# Patient Record
Sex: Female | Born: 1945
Health system: Southern US, Community
[De-identification: ages and names within clinical notes are randomized; demographics above are authoritative.]

## PROBLEM LIST (undated history)

## (undated) DIAGNOSIS — F419 Anxiety disorder, unspecified: Secondary | ICD-10-CM

## (undated) DIAGNOSIS — L719 Rosacea, unspecified: Secondary | ICD-10-CM

## (undated) DIAGNOSIS — E039 Hypothyroidism, unspecified: Secondary | ICD-10-CM

## (undated) DIAGNOSIS — I10 Essential (primary) hypertension: Secondary | ICD-10-CM

## (undated) DIAGNOSIS — M722 Plantar fascial fibromatosis: Secondary | ICD-10-CM

## (undated) DIAGNOSIS — T7840XA Allergy, unspecified, initial encounter: Secondary | ICD-10-CM

## (undated) DIAGNOSIS — K219 Gastro-esophageal reflux disease without esophagitis: Secondary | ICD-10-CM

## (undated) DIAGNOSIS — I872 Venous insufficiency (chronic) (peripheral): Secondary | ICD-10-CM

## (undated) DIAGNOSIS — E213 Hyperparathyroidism, unspecified: Secondary | ICD-10-CM

## (undated) DIAGNOSIS — D518 Other vitamin B12 deficiency anemias: Secondary | ICD-10-CM

## (undated) DIAGNOSIS — K901 Tropical sprue: Secondary | ICD-10-CM

## (undated) DIAGNOSIS — D509 Iron deficiency anemia, unspecified: Secondary | ICD-10-CM

## (undated) DIAGNOSIS — M810 Age-related osteoporosis without current pathological fracture: Secondary | ICD-10-CM

## (undated) HISTORY — DX: Anxiety disorder, unspecified: F41.9

## (undated) HISTORY — DX: Tropical sprue: K90.1

## (undated) HISTORY — PX: OTHER SURGICAL HISTORY: SHX169

## (undated) HISTORY — DX: Allergy, unspecified, initial encounter: T78.40XA

## (undated) HISTORY — PX: CARPAL TUNNEL RELEASE: SHX101

## (undated) HISTORY — DX: Venous insufficiency (chronic) (peripheral): I87.2

## (undated) HISTORY — PX: BREAST BIOPSY: SHX20

## (undated) HISTORY — DX: Age-related osteoporosis without current pathological fracture: M81.0

## (undated) HISTORY — DX: Essential (primary) hypertension: I10

## (undated) HISTORY — DX: Iron deficiency anemia, unspecified: D50.9

## (undated) HISTORY — PX: TONSILLECTOMY: SUR1361

## (undated) HISTORY — DX: Gastro-esophageal reflux disease without esophagitis: K21.9

## (undated) HISTORY — DX: Other vitamin B12 deficiency anemias: D51.8

## (undated) HISTORY — DX: Hypothyroidism, unspecified: E03.9

## (undated) HISTORY — PX: KNEE ARTHROSCOPY: SHX127

## (undated) HISTORY — DX: Plantar fascial fibromatosis: M72.2

## (undated) HISTORY — PX: BREAST EXCISIONAL BIOPSY: SUR124

## (undated) HISTORY — DX: Rosacea, unspecified: L71.9

## (undated) HISTORY — DX: Hyperparathyroidism, unspecified: E21.3

---
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
## 1998-12-14 ENCOUNTER — Other Ambulatory Visit: Admission: RE | Admit: 1998-12-14 | Discharge: 1998-12-14 | Payer: Self-pay | Admitting: *Deleted

## 1998-12-30 ENCOUNTER — Encounter: Payer: Self-pay | Admitting: *Deleted

## 1998-12-30 ENCOUNTER — Ambulatory Visit (HOSPITAL_COMMUNITY): Admission: RE | Admit: 1998-12-30 | Discharge: 1998-12-30 | Payer: Self-pay | Admitting: *Deleted

## 1999-02-22 ENCOUNTER — Emergency Department (HOSPITAL_COMMUNITY): Admission: EM | Admit: 1999-02-22 | Discharge: 1999-02-22 | Payer: Self-pay | Admitting: Emergency Medicine

## 1999-02-22 ENCOUNTER — Encounter: Payer: Self-pay | Admitting: Emergency Medicine

## 1999-03-23 ENCOUNTER — Encounter (INDEPENDENT_AMBULATORY_CARE_PROVIDER_SITE_OTHER): Payer: Self-pay | Admitting: Specialist

## 1999-03-23 ENCOUNTER — Other Ambulatory Visit: Admission: RE | Admit: 1999-03-23 | Discharge: 1999-03-23 | Payer: Self-pay | Admitting: Gastroenterology

## 1999-03-27 ENCOUNTER — Ambulatory Visit (HOSPITAL_COMMUNITY): Admission: RE | Admit: 1999-03-27 | Discharge: 1999-03-27 | Payer: Self-pay | Admitting: Gastroenterology

## 1999-05-25 ENCOUNTER — Encounter: Payer: Self-pay | Admitting: Surgery

## 1999-05-29 ENCOUNTER — Observation Stay (HOSPITAL_COMMUNITY): Admission: RE | Admit: 1999-05-29 | Discharge: 1999-05-30 | Payer: Self-pay | Admitting: Surgery

## 1999-06-06 ENCOUNTER — Encounter: Payer: Self-pay | Admitting: Surgery

## 1999-06-06 ENCOUNTER — Ambulatory Visit (HOSPITAL_COMMUNITY): Admission: RE | Admit: 1999-06-06 | Discharge: 1999-06-06 | Payer: Self-pay | Admitting: Surgery

## 1999-06-13 ENCOUNTER — Ambulatory Visit (HOSPITAL_COMMUNITY): Admission: RE | Admit: 1999-06-13 | Discharge: 1999-06-13 | Payer: Self-pay | Admitting: Surgery

## 1999-06-13 ENCOUNTER — Encounter: Payer: Self-pay | Admitting: Surgery

## 1999-06-20 ENCOUNTER — Ambulatory Visit (HOSPITAL_COMMUNITY): Admission: RE | Admit: 1999-06-20 | Discharge: 1999-06-20 | Payer: Self-pay | Admitting: Gastroenterology

## 1999-06-20 ENCOUNTER — Encounter: Payer: Self-pay | Admitting: Gastroenterology

## 1999-11-20 ENCOUNTER — Other Ambulatory Visit: Admission: RE | Admit: 1999-11-20 | Discharge: 1999-11-20 | Payer: Self-pay | Admitting: *Deleted

## 1999-12-04 ENCOUNTER — Ambulatory Visit (HOSPITAL_COMMUNITY): Admission: RE | Admit: 1999-12-04 | Discharge: 1999-12-04 | Payer: Self-pay | Admitting: *Deleted

## 1999-12-04 ENCOUNTER — Encounter: Payer: Self-pay | Admitting: *Deleted

## 2004-08-24 ENCOUNTER — Ambulatory Visit: Payer: Self-pay | Admitting: Internal Medicine

## 2004-12-14 ENCOUNTER — Ambulatory Visit: Payer: Self-pay | Admitting: Internal Medicine

## 2005-02-15 ENCOUNTER — Ambulatory Visit: Payer: Self-pay | Admitting: Internal Medicine

## 2005-02-22 ENCOUNTER — Ambulatory Visit: Payer: Self-pay | Admitting: Internal Medicine

## 2005-03-29 ENCOUNTER — Ambulatory Visit: Payer: Self-pay | Admitting: Internal Medicine

## 2005-06-07 ENCOUNTER — Ambulatory Visit: Payer: Self-pay | Admitting: Internal Medicine

## 2005-06-18 ENCOUNTER — Other Ambulatory Visit: Admission: RE | Admit: 2005-06-18 | Discharge: 2005-06-18 | Payer: Self-pay | Admitting: *Deleted

## 2005-07-19 ENCOUNTER — Ambulatory Visit: Payer: Self-pay | Admitting: Internal Medicine

## 2005-10-29 ENCOUNTER — Ambulatory Visit: Payer: Self-pay | Admitting: Internal Medicine

## 2006-02-14 ENCOUNTER — Ambulatory Visit: Payer: Self-pay | Admitting: Internal Medicine

## 2006-03-18 ENCOUNTER — Ambulatory Visit: Payer: Self-pay | Admitting: Internal Medicine

## 2006-04-26 ENCOUNTER — Ambulatory Visit: Payer: Self-pay | Admitting: Internal Medicine

## 2006-07-03 ENCOUNTER — Ambulatory Visit: Payer: Self-pay | Admitting: Internal Medicine

## 2006-08-27 ENCOUNTER — Ambulatory Visit: Payer: Self-pay | Admitting: Internal Medicine

## 2007-01-02 ENCOUNTER — Ambulatory Visit: Payer: Self-pay | Admitting: Internal Medicine

## 2007-03-06 ENCOUNTER — Ambulatory Visit: Payer: Self-pay | Admitting: Internal Medicine

## 2007-04-28 DIAGNOSIS — I872 Venous insufficiency (chronic) (peripheral): Secondary | ICD-10-CM

## 2007-04-28 DIAGNOSIS — E038 Other specified hypothyroidism: Secondary | ICD-10-CM | POA: Insufficient documentation

## 2007-04-28 DIAGNOSIS — K219 Gastro-esophageal reflux disease without esophagitis: Secondary | ICD-10-CM | POA: Insufficient documentation

## 2007-04-28 HISTORY — DX: Venous insufficiency (chronic) (peripheral): I87.2

## 2007-05-06 ENCOUNTER — Ambulatory Visit: Payer: Self-pay | Admitting: Internal Medicine

## 2007-05-06 DIAGNOSIS — I10 Essential (primary) hypertension: Secondary | ICD-10-CM | POA: Insufficient documentation

## 2007-05-06 DIAGNOSIS — J309 Allergic rhinitis, unspecified: Secondary | ICD-10-CM | POA: Insufficient documentation

## 2007-05-06 DIAGNOSIS — M810 Age-related osteoporosis without current pathological fracture: Secondary | ICD-10-CM

## 2007-05-06 HISTORY — DX: Age-related osteoporosis without current pathological fracture: M81.0

## 2007-05-06 LAB — CONVERTED CEMR LAB
BUN: 27 mg/dL — ABNORMAL HIGH (ref 6–23)
CO2: 26 meq/L (ref 19–32)
Calcium: 9.5 mg/dL (ref 8.4–10.5)
Chloride: 104 meq/L (ref 96–112)
Creatinine, Ser: 1.6 mg/dL — ABNORMAL HIGH (ref 0.4–1.2)
GFR calc Af Amer: 42 mL/min
GFR calc non Af Amer: 35 mL/min
Glucose, Bld: 94 mg/dL (ref 70–99)
Potassium: 4.1 meq/L (ref 3.5–5.1)
Sodium: 141 meq/L (ref 135–145)
TSH: 0.6 microintl units/mL (ref 0.35–5.50)
Vit D, 1,25-Dihydroxy: 17 — ABNORMAL LOW (ref 20–57)

## 2007-05-08 ENCOUNTER — Telehealth: Payer: Self-pay | Admitting: *Deleted

## 2007-07-18 ENCOUNTER — Ambulatory Visit: Payer: Self-pay | Admitting: Internal Medicine

## 2007-07-31 ENCOUNTER — Ambulatory Visit: Payer: Self-pay | Admitting: Internal Medicine

## 2007-07-31 ENCOUNTER — Encounter: Payer: Self-pay | Admitting: Internal Medicine

## 2007-08-06 ENCOUNTER — Ambulatory Visit: Payer: Self-pay | Admitting: Internal Medicine

## 2007-08-18 DIAGNOSIS — K900 Celiac disease: Secondary | ICD-10-CM | POA: Insufficient documentation

## 2007-08-29 ENCOUNTER — Encounter: Payer: Self-pay | Admitting: Internal Medicine

## 2007-09-16 ENCOUNTER — Encounter: Payer: Self-pay | Admitting: Internal Medicine

## 2007-10-31 ENCOUNTER — Encounter: Payer: Self-pay | Admitting: Internal Medicine

## 2007-11-07 ENCOUNTER — Ambulatory Visit: Payer: Self-pay | Admitting: Internal Medicine

## 2008-01-29 ENCOUNTER — Ambulatory Visit: Payer: Self-pay | Admitting: Internal Medicine

## 2008-01-29 ENCOUNTER — Telehealth: Payer: Self-pay | Admitting: Internal Medicine

## 2008-02-03 ENCOUNTER — Ambulatory Visit: Payer: Self-pay | Admitting: Internal Medicine

## 2008-03-18 ENCOUNTER — Ambulatory Visit: Payer: Self-pay | Admitting: Internal Medicine

## 2008-03-18 LAB — CONVERTED CEMR LAB
T3 Uptake Ratio: 38.9 % — ABNORMAL HIGH (ref 22.5–37.0)
T4, Total: 9.6 ug/dL (ref 5.0–12.5)
TSH: 1.04 microintl units/mL (ref 0.35–5.50)

## 2008-03-31 ENCOUNTER — Telehealth: Payer: Self-pay | Admitting: Internal Medicine

## 2008-04-16 ENCOUNTER — Ambulatory Visit: Payer: Self-pay | Admitting: Internal Medicine

## 2008-05-06 ENCOUNTER — Encounter: Payer: Self-pay | Admitting: Internal Medicine

## 2008-06-14 ENCOUNTER — Ambulatory Visit: Payer: Self-pay | Admitting: Internal Medicine

## 2008-06-14 LAB — CONVERTED CEMR LAB
Basophils Absolute: 0 10*3/uL (ref 0.0–0.1)
Basophils Relative: 0.5 % (ref 0.0–3.0)
Eosinophils Absolute: 0.2 10*3/uL (ref 0.0–0.7)
Eosinophils Relative: 3.2 % (ref 0.0–5.0)
Free T4: 1.1 ng/dL (ref 0.6–1.6)
HCT: 37.4 % (ref 36.0–46.0)
Hemoglobin: 13 g/dL (ref 12.0–15.0)
Lymphocytes Relative: 31 % (ref 12.0–46.0)
MCHC: 34.9 g/dL (ref 30.0–36.0)
MCV: 100.8 fL — ABNORMAL HIGH (ref 78.0–100.0)
Monocytes Absolute: 0.9 10*3/uL (ref 0.1–1.0)
Monocytes Relative: 14 % — ABNORMAL HIGH (ref 3.0–12.0)
Neutro Abs: 3.5 10*3/uL (ref 1.4–7.7)
Neutrophils Relative %: 51.3 % (ref 43.0–77.0)
Platelets: 326 10*3/uL (ref 150–400)
RBC: 3.71 M/uL — ABNORMAL LOW (ref 3.87–5.11)
RDW: 13.1 % (ref 11.5–14.6)
T3, Free: 3 pg/mL (ref 2.3–4.2)
TSH: 1.21 microintl units/mL (ref 0.35–5.50)
WBC: 6.6 10*3/uL (ref 4.5–10.5)

## 2008-07-01 ENCOUNTER — Telehealth: Payer: Self-pay | Admitting: Internal Medicine

## 2008-07-12 ENCOUNTER — Telehealth: Payer: Self-pay | Admitting: Internal Medicine

## 2008-10-08 ENCOUNTER — Ambulatory Visit: Payer: Self-pay | Admitting: Internal Medicine

## 2008-10-08 LAB — CONVERTED CEMR LAB
BUN: 32 mg/dL — ABNORMAL HIGH (ref 6–23)
Basophils Absolute: 0 10*3/uL (ref 0.0–0.1)
Basophils Relative: 1 % (ref 0–1)
CO2: 28 meq/L (ref 19–32)
Calcium: 10 mg/dL (ref 8.4–10.5)
Chloride: 100 meq/L (ref 96–112)
Creatinine, Ser: 1.4 mg/dL — ABNORMAL HIGH (ref 0.40–1.20)
Eosinophils Absolute: 0.3 10*3/uL (ref 0.0–0.7)
Eosinophils Relative: 4 % (ref 0–5)
Glucose, Bld: 102 mg/dL — ABNORMAL HIGH (ref 70–99)
HCT: 35.9 % — ABNORMAL LOW (ref 36.0–46.0)
Hemoglobin: 12.1 g/dL (ref 12.0–15.0)
Lymphocytes Relative: 34 % (ref 12–46)
Lymphs Abs: 2.4 10*3/uL (ref 0.7–4.0)
MCHC: 33.7 g/dL (ref 30.0–36.0)
MCV: 98.6 fL (ref 78.0–100.0)
Monocytes Absolute: 0.9 10*3/uL (ref 0.1–1.0)
Monocytes Relative: 13 % — ABNORMAL HIGH (ref 3–12)
Neutro Abs: 3.4 10*3/uL (ref 1.7–7.7)
Neutrophils Relative %: 48 % (ref 43–77)
Platelets: 301 10*3/uL (ref 150–400)
Potassium: 4.3 meq/L (ref 3.5–5.3)
Pro B Natriuretic peptide (BNP): 29.6 pg/mL (ref 0.0–100.0)
RBC: 3.64 M/uL — ABNORMAL LOW (ref 3.87–5.11)
RDW: 12.9 % (ref 11.5–15.5)
Sodium: 141 meq/L (ref 135–145)
TSH: 1.355 microintl units/mL (ref 0.350–4.50)
WBC: 7 10*3/uL (ref 4.0–10.5)

## 2008-10-29 ENCOUNTER — Ambulatory Visit: Payer: Self-pay | Admitting: Internal Medicine

## 2009-01-04 ENCOUNTER — Telehealth: Payer: Self-pay | Admitting: Internal Medicine

## 2009-01-06 ENCOUNTER — Telehealth: Payer: Self-pay | Admitting: *Deleted

## 2009-01-28 ENCOUNTER — Ambulatory Visit: Payer: Self-pay | Admitting: Internal Medicine

## 2009-01-28 DIAGNOSIS — D509 Iron deficiency anemia, unspecified: Secondary | ICD-10-CM

## 2009-01-28 DIAGNOSIS — D518 Other vitamin B12 deficiency anemias: Secondary | ICD-10-CM

## 2009-01-28 HISTORY — DX: Iron deficiency anemia, unspecified: D50.9

## 2009-01-28 HISTORY — DX: Other vitamin B12 deficiency anemias: D51.8

## 2009-01-28 LAB — CONVERTED CEMR LAB
Alkaline Phosphatase: 61 units/L (ref 39–117)
Basophils Relative: 1 % (ref 0–1)
Bilirubin, Direct: 0.1 mg/dL (ref 0.0–0.3)
Calcium: 9.4 mg/dL (ref 8.4–10.5)
Chloride: 107 meq/L (ref 96–112)
Creatinine, Ser: 1.31 mg/dL — ABNORMAL HIGH (ref 0.40–1.20)
Eosinophils Absolute: 0.3 10*3/uL (ref 0.0–0.7)
Indirect Bilirubin: 0.3 mg/dL (ref 0.0–0.9)
Iron: 62 ug/dL (ref 42–145)
Lymphs Abs: 2.2 10*3/uL (ref 0.7–4.0)
MCV: 98.6 fL (ref 78.0–100.0)
Neutro Abs: 4.1 10*3/uL (ref 1.7–7.7)
Neutrophils Relative %: 55 % (ref 43–77)
Platelets: 333 10*3/uL (ref 150–400)
RBC: 3.7 M/uL — ABNORMAL LOW (ref 3.87–5.11)
Saturation Ratios: 20 % (ref 20–55)
Sodium: 144 meq/L (ref 135–145)
TIBC: 315 ug/dL (ref 250–470)
Transferrin: 233 mg/dL (ref 212–360)
UIBC: 253 ug/dL
WBC: 7.5 10*3/uL (ref 4.0–10.5)

## 2009-02-04 ENCOUNTER — Telehealth: Payer: Self-pay | Admitting: Internal Medicine

## 2009-02-25 ENCOUNTER — Ambulatory Visit: Payer: Self-pay | Admitting: Internal Medicine

## 2009-02-25 DIAGNOSIS — L719 Rosacea, unspecified: Secondary | ICD-10-CM

## 2009-02-25 HISTORY — DX: Rosacea, unspecified: L71.9

## 2009-02-28 ENCOUNTER — Ambulatory Visit: Payer: Self-pay | Admitting: Internal Medicine

## 2009-02-28 ENCOUNTER — Telehealth: Payer: Self-pay | Admitting: Internal Medicine

## 2009-03-31 LAB — HM COLONOSCOPY

## 2009-04-28 ENCOUNTER — Ambulatory Visit: Payer: Self-pay | Admitting: Internal Medicine

## 2009-04-28 LAB — CONVERTED CEMR LAB
Calcium: 9.7 mg/dL (ref 8.4–10.5)
Folate: >20 ng/mL
GFR calc non Af Amer: 43.79 mL/min (ref >60)
Hgb A1c MFr Bld: 5.3 % (ref 4.6–6.5)
Iron: 71 ug/dL (ref 42–145)
Saturation Ratios: 23.3 % (ref 20.0–50.0)
Sodium: 143 meq/L (ref 135–145)
Vitamin B-12: 1053 pg/mL — ABNORMAL HIGH (ref 211–911)

## 2009-07-05 ENCOUNTER — Ambulatory Visit: Payer: Self-pay | Admitting: Internal Medicine

## 2009-07-05 DIAGNOSIS — M722 Plantar fascial fibromatosis: Secondary | ICD-10-CM

## 2009-07-05 HISTORY — DX: Plantar fascial fibromatosis: M72.2

## 2009-10-07 ENCOUNTER — Ambulatory Visit: Payer: Self-pay | Admitting: Internal Medicine

## 2009-11-10 ENCOUNTER — Encounter: Payer: Self-pay | Admitting: Internal Medicine

## 2010-01-03 ENCOUNTER — Ambulatory Visit: Payer: Self-pay | Admitting: Internal Medicine

## 2010-01-03 LAB — CONVERTED CEMR LAB
Basophils Relative: 0.6 % (ref 0.0–3.0)
Eosinophils Absolute: 0.3 10*3/uL (ref 0.0–0.7)
HCT: 35.2 % — ABNORMAL LOW (ref 36.0–46.0)
Hemoglobin: 12.4 g/dL (ref 12.0–15.0)
Lymphocytes Relative: 29.3 % (ref 12.0–46.0)
MCHC: 35.3 g/dL (ref 30.0–36.0)
MCV: 100 fL (ref 78.0–100.0)
Monocytes Absolute: 1 10*3/uL (ref 0.1–1.0)
Neutro Abs: 4.6 10*3/uL (ref 1.4–7.7)
RBC: 3.52 M/uL — ABNORMAL LOW (ref 3.87–5.11)

## 2010-01-31 ENCOUNTER — Ambulatory Visit: Payer: Self-pay | Admitting: Internal Medicine

## 2010-03-07 ENCOUNTER — Ambulatory Visit: Payer: Self-pay | Admitting: Internal Medicine

## 2010-03-07 LAB — CONVERTED CEMR LAB: T3, Free: 3.5 pg/mL (ref 2.3–4.2)

## 2010-03-14 ENCOUNTER — Ambulatory Visit: Payer: Self-pay | Admitting: Internal Medicine

## 2010-04-03 ENCOUNTER — Telehealth: Payer: Self-pay | Admitting: Internal Medicine

## 2010-06-13 ENCOUNTER — Ambulatory Visit: Payer: Self-pay | Admitting: Internal Medicine

## 2010-06-13 DIAGNOSIS — I720 Aneurysm of carotid artery: Secondary | ICD-10-CM | POA: Insufficient documentation

## 2010-06-15 ENCOUNTER — Encounter: Payer: Self-pay | Admitting: Internal Medicine

## 2010-06-16 ENCOUNTER — Ambulatory Visit: Payer: Self-pay

## 2010-06-16 ENCOUNTER — Encounter: Payer: Self-pay | Admitting: Internal Medicine

## 2010-09-08 ENCOUNTER — Ambulatory Visit: Payer: Self-pay | Admitting: Family Medicine

## 2010-09-20 ENCOUNTER — Ambulatory Visit
Admission: RE | Admit: 2010-09-20 | Discharge: 2010-09-20 | Payer: Self-pay | Source: Home / Self Care | Attending: Internal Medicine | Admitting: Internal Medicine

## 2010-10-17 NOTE — Progress Notes (Signed)
Summary: sinus/  Phone Note Call from Patient   Caller: Patient Call For: Ricard Dillon MD Summary of Call: Asking for meds for sinus complaints and productive cough......Marland Kitchenblood from nose.........green productive cough.  Temp 99 at night. [place].   [id number] Initial call taken by: Deanna Artis CMA,  April 03, 2010 8:48 AM  Follow-up for Phone Call        perdr Larrie Kass hve clarithromycin 500 two times a day for 10 days and otc meds for cough Follow-up by: Allyne Gee, LPN,  July 18, 624THL [id number] AM    New/Updated Medications: CLARITHROMYCIN 500 MG TABS (CLARITHROMYCIN) one by mouth two times a day x 10 days Prescriptions: CLARITHROMYCIN 500 MG TABS (CLARITHROMYCIN) one by mouth two times a day x 10 days  #20 x 0   Entered by:   Deanna Artis CMA   Authorized by:   Ricard Dillon MD   Signed by:   Deanna Artis CMA on 04/03/2010   Method used:   Electronically to        [facility] (retail)       [place], [place]  [id number]       Ph: [phone]       Fax: [phone]   RxID:   [id number]  Pt. notified.

## 2010-10-17 NOTE — Progress Notes (Signed)
Summary: [facility]. Problem with Biaxin. Pt waiting at Pharmacy  Phone Note From Pharmacy Call back at [phone] Angie at [place]: [facility]* Reason for Call: Allergy Alert Summary of Call: Pt is at the pharmacy to pick up script, but it is not available from manufacturer. Need to get alternative to Biaxin 500mg  1 two times a day. Pharmacy has the Biaxin XR 500mg , but it would be 2 once a day, instead of two times a day. Pls call asap. Pt waiting at store.    Initial call taken by: Braulio Bosch,  April 03, 2010 11:35 AM  Follow-up for Phone Call        ok per dr Arnoldo Morale Follow-up by: Allyne Gee, LPN,  July 18, 624THL 075-GRM AM

## 2010-10-17 NOTE — Miscellaneous (Signed)
Summary: Orders Update  Clinical Lists Changes  Orders: Added new Test order of Carotid Duplex (Carotid Duplex) - Signed 

## 2010-10-17 NOTE — Assessment & Plan Note (Signed)
Summary: 1 month rov/njr   Vital Signs:  Patient profile:   65 year old female Height:      65 inches Weight:      208 pounds BMI:     34.74 Temp:     98.2 degrees F oral Pulse rate:   76 / minute Resp:     14 per minute BP sitting:   140 / 74  (left arm)  Vitals Entered By: Allyne Gee, LPN (May 17, 624THL QA348G PM) CC: roa-   CC:  roa-.  Preventive Screening-Counseling & Management  Alcohol-Tobacco     Smoking Status: never  Problems Prior to Update: 1)  Osteoarthros Unspec Whether Gen/loc Unspec Site  (ICD-715.90) 2)  Plantar Fasciitis, Left  (ICD-728.71) 3)  Onychomycosis  (ICD-110.1) 4)  Rosacea  (ICD-695.3) 5)  Anemia, B12 Deficiency  (ICD-281.1) 6)  Unspecified Iron Deficiency Anemia  (ICD-280.9) 7)  Adverse Drug Reaction  (ICD-995.20) 8)  Edema  (ICD-782.3) 9)  Loc Osteoarthros Not Spec Prim/sec Lower Leg  (ICD-715.36) 10)  Hemangioma of Skin and Subcutaneous Tissue  (ICD-228.01) 11)  Acute Bronchitis  (ICD-466.0) 12)  Celiac Disease  (ICD-579.0) 13)  Other Specified Intestinal Malabsorption  (ICD-579.8) 14)  Osteoporosis  (ICD-733.00) 15)  Hypertension  (ICD-401.9) 16)  Anxiety  (ICD-300.00) 17)  Allergic Rhinitis  (ICD-477.9) 18)  Venous Insufficiency, Chronic  (ICD-459.81) 19)  Obesity  (ICD-278.00) 20)  Hypothyroidism  (ICD-244.9) 21)  Gerd  (ICD-530.81) 22)  Asthma  (ICD-493.90)  Current Problems (verified): 1)  Osteoarthros Unspec Whether Gen/loc Unspec Site  (ICD-715.90) 2)  Plantar Fasciitis, Left  (ICD-728.71) 3)  Onychomycosis  (ICD-110.1) 4)  Rosacea  (ICD-695.3) 5)  Anemia, B12 Deficiency  (ICD-281.1) 6)  Unspecified Iron Deficiency Anemia  (ICD-280.9) 7)  Adverse Drug Reaction  (ICD-995.20) 8)  Edema  (ICD-782.3) 9)  Loc Osteoarthros Not Spec Prim/sec Lower Leg  (ICD-715.36) 10)  Hemangioma of Skin and Subcutaneous Tissue  (ICD-228.01) 11)  Acute Bronchitis  (ICD-466.0) 12)  Celiac Disease  (ICD-579.0) 13)  Other Specified  Intestinal Malabsorption  (ICD-579.8) 14)  Osteoporosis  (ICD-733.00) 15)  Hypertension  (ICD-401.9) 16)  Anxiety  (ICD-300.00) 17)  Allergic Rhinitis  (ICD-477.9) 18)  Venous Insufficiency, Chronic  (ICD-459.81) 19)  Obesity  (ICD-278.00) 20)  Hypothyroidism  (ICD-244.9) 21)  Gerd  (ICD-530.81) 22)  Asthma  (ICD-493.90)  Medications Prior to Update: 1)  Synthroid 75 Mcg  Tabs (Levothyroxine Sodium) .... Take 1 Tablet By Mouth Once A Day 2)  Xanax 0.25 Mg  Tabs (Alprazolam) .... Three Times A Day As Needed 3)  Wellbutrin Xl 150 Mg  Tb24 (Bupropion Hcl) .... 2 Every Morning 1 Every Afternoon 4)  Allegra-D 12 Hour 60-120 Mg  Tb12 (Fexofenadine-Pseudoephedrine) .... Once Daily As Needed 5)  Potassium Chloride Cr 10 Meq  Tbcr (Potassium Chloride) .... Once Daily 6)  Micardis 40 Mg  Tabs (Telmisartan) .... Take 1/2  Tablet By Mouth Once A Day 7)  Singulair 10 Mg  Tabs (Montelukast Sodium) .... Take 1 Tablet By Mouth Once A Day 8)  Zegerid 40-1100 Mg  Caps (Omeprazole-Sodium Bicarbonate) .... Take 1 Capsule By Mouth At Bedtime 9)  Duoneb 2.5-0.5 Mg/58ml  Soln (Albuterol-Ipratropium) .... As Needed 10)  Veramyst 27.5 Mcg/spray  Susp (Fluticasone Furoate) .... As Needed 11)  Symbicort 160-4.5 Mcg/act  Aero (Budesonide-Formoterol Fumarate) .... Two Times A Day 12)  Amitiza 8 Mcg  Caps (Lubiprostone) .Marland Kitchen.. 1 Once Daily 13)  Restasis 0.05 % Emul (Cyclosporine) .Marland Kitchen.. 1 Drop Bothe  Eyes Bid 14)  Prenatal/iron  Tabs (Prenatal Multivit-Min-Fe-Fa) .... May Substitute  One By Mouth Daily 15)  Torsemide 100 Mg Tabs (Torsemide) .... One By Mouth Q Am 16)  Rosac 10-5 % Crea (Sulfacetamide-Sulfur-Sunscreen) .... Apply Q Hs 17)  Levsin/sl 0.125 Mg Subl (Hyoscyamine Sulfate) .... One Sl Q 2-4 Hours Prn 18)  Vimovo 500-20 Mg Tbec (Naproxen-Esomeprazole) .... One By Mouth Daily  Current Medications (verified): 1)  Armour Thyroid 90 Mg Tabs (Thyroid) .... One By Mouth Daily 2)  Xanax 0.25 Mg  Tabs (Alprazolam)  .... Three Times A Day As Needed 3)  Wellbutrin Xl 150 Mg  Tb24 (Bupropion Hcl) .... 2 Every Morning 1 Every Afternoon 4)  Allegra-D 12 Hour 60-120 Mg  Tb12 (Fexofenadine-Pseudoephedrine) .... Once Daily As Needed 5)  Potassium Chloride Cr 10 Meq  Tbcr (Potassium Chloride) .... Once Daily 6)  Micardis 40 Mg  Tabs (Telmisartan) .... Take 1/2  Tablet By Mouth Once A Day 7)  Singulair 10 Mg  Tabs (Montelukast Sodium) .... Take 1 Tablet By Mouth Once A Day 8)  Zegerid 40-1100 Mg  Caps (Omeprazole-Sodium Bicarbonate) .... Take 1 Capsule By Mouth At Bedtime 9)  Duoneb 2.5-0.5 Mg/58ml  Soln (Albuterol-Ipratropium) .... As Needed 10)  Nasonex 50 Mcg/act Susp (Mometasone Furoate) .... Two Sprays in Each Nostril Daily 11)  Symbicort 160-4.5 Mcg/act  Aero (Budesonide-Formoterol Fumarate) .... Two Times A Day 12)  Amitiza 8 Mcg  Caps (Lubiprostone) .Marland Kitchen.. 1 Once Daily 13)  Restasis 0.05 % Emul (Cyclosporine) .Marland Kitchen.. 1 Drop Bothe Eyes Bid 14)  Prenatal/iron  Tabs (Prenatal Multivit-Min-Fe-Fa) .... May Substitute  One By Mouth Daily 15)  Torsemide 100 Mg Tabs (Torsemide) .... One By Mouth Q Am 16)  Rosac 10-5 % Crea (Sulfacetamide-Sulfur-Sunscreen) .... Apply Q Hs 17)  Levsin/sl 0.125 Mg Subl (Hyoscyamine Sulfate) .... One Sl Q 2-4 Hours Prn 18)  Vimovo 500-20 Mg Tbec (Naproxen-Esomeprazole) .... One By Mouth Daily  Allergies (verified): 1)  ! Penicillin V Potassium (Penicillin V Potassium) 2)  ! Compazine  Past History:  Family History: Last updated: 2009-07-30 mother died of lung cnacer at age 69 father alive at age 77 macular degeneration two brothesr alive and well uncle with colon cancer  Social History: Last updated: July 30, 2009 Married Never Smoked Occupation: professional Alcohol use-no Drug use-no Regular exercise-no  Risk Factors: Exercise: no (30-Jul-2009)  Risk Factors: Smoking Status: never (01/31/2010)  Past medical, surgical, family and social histories (including risk  factors) reviewed, and no changes noted (except as noted below).  Past Medical History: Reviewed history from 06/14/2008 and no changes required. Allergic rhinitis Anxiety Asthma Hypertension Hypothyroidism GERD SPRUE  Past Surgical History: Reviewed history from 05/06/2007 and no changes required. Carpal tunnel release Tonsillectomy nissan fundiplication arthroscopy rt knee  Family History: Reviewed history from 07-30-09 and no changes required. mother died of lung cnacer at age 55 father alive at age 25 macular degeneration two brothesr alive and well uncle with colon cancer  Social History: Reviewed history from 2009-07-30 and no changes required. Married Never Smoked Occupation: professional Alcohol use-no Drug use-no Regular exercise-no  Review of Systems  The patient denies anorexia, fever, weight loss, weight gain, vision loss, decreased hearing, hoarseness, chest pain, syncope, dyspnea on exertion, peripheral edema, prolonged cough, headaches, hemoptysis, abdominal pain, melena, hematochezia, severe indigestion/heartburn, hematuria, incontinence, genital sores, muscle weakness, suspicious skin lesions, transient blindness, difficulty walking, depression, unusual weight change, abnormal bleeding, enlarged lymph nodes, angioedema, and breast masses.    Physical Exam  General:  alert, well-developed, and overweight-appearing.   Head:  normocephalic and atraumatic.   Eyes:  pupils equal and pupils round.   Ears:  R ear normal and L ear normal.   Nose:  no external deformity and no nasal discharge.   Lungs:  normal respiratory effort and no intercostal retractions.   Heart:  normal rate and regular rhythm.   Abdomen:  soft, non-tender, and normal bowel sounds.   Msk:  normal ROM and joint tenderness.  in the left plantar facia Extremities:  trace left pedal edema and trace right pedal edema.   Neurologic:  alert & oriented X3 and finger-to-nose normal.      Impression & Recommendations:  Problem # 1:  HYPERTENSION (ICD-401.9)  Her updated medication list for this problem includes:    Micardis 40 Mg Tabs (Telmisartan) .Marland Kitchen... Take 1/2  tablet by mouth once a day    Torsemide 100 Mg Tabs (Torsemide) ..... One by mouth q am  BP today: 140/74 Prior BP: 130/76 (01/03/2010)  Prior 10 Yr Risk Heart Disease: Not enough information (08/06/2007)  Labs Reviewed: K+: 4.1 (04/28/2009) Creat: : 1.3 (04/28/2009)     Problem # 2:  HYPOTHYROIDISM (ICD-244.9)  the pt was on synthroid with persistant hypothyroid symptoms and a normal T4 with a low T3 free therefore a change to a mixed t3 andT4 product was warrented and may require precertification Her updated medication list for this problem includes:    Armour Thyroid 90 Mg Tabs (Thyroid) ..... One by mouth daily  Labs Reviewed: TSH: 1.34 (01/03/2010)   Free T4: 1.4 (01/03/2010)   T3 was at lower end HgBA1c: 5.3 (04/28/2009)  Labs Reviewed: TSH: 1.34 (01/03/2010)   Free T4: 1.4 (01/03/2010)    HgBA1c: 5.3 (04/28/2009)  Problem # 3:  ALLERGIC RHINITIS (ICD-477.9)  Her updated medication list for this problem includes:    Nasonex 50 Mcg/act Susp (Mometasone furoate) .Marland Kitchen..Marland Kitchen Two sprays in each nostril daily  Discussed use of allergy medications and environmental measures.   Problem # 4:  ASTHMA (ICD-493.90) albuterol use increased but  not over 4 times a week Her updated medication list for this problem includes:    Singulair 10 Mg Tabs (Montelukast sodium) .Marland Kitchen... Take 1 tablet by mouth once a day    Duoneb 2.5-0.5 Mg/64ml Soln (Albuterol-ipratropium) .Marland Kitchen... As needed    Symbicort 160-4.5 Mcg/act Aero (Budesonide-formoterol fumarate) .Marland Kitchen..Marland Kitchen Two times a day  Complete Medication List: 1)  Armour Thyroid 90 Mg Tabs (Thyroid) .... One by mouth daily 2)  Xanax 0.25 Mg Tabs (Alprazolam) .... Three times a day as needed 3)  Wellbutrin Xl 150 Mg Tb24 (Bupropion hcl) .... 2 every morning 1 every  afternoon 4)  Allegra-d 12 Hour 60-120 Mg Tb12 (Fexofenadine-pseudoephedrine) .... Once daily as needed 5)  Potassium Chloride Cr 10 Meq Tbcr (Potassium chloride) .... Once daily 6)  Micardis 40 Mg Tabs (Telmisartan) .... Take 1/2  tablet by mouth once a day 7)  Singulair 10 Mg Tabs (Montelukast sodium) .... Take 1 tablet by mouth once a day 8)  Zegerid 40-1100 Mg Caps (Omeprazole-sodium bicarbonate) .... Take 1 capsule by mouth at bedtime 9)  Duoneb 2.5-0.5 Mg/24ml Soln (Albuterol-ipratropium) .... As needed 10)  Nasonex 50 Mcg/act Susp (Mometasone furoate) .... Two sprays in each nostril daily 11)  Symbicort 160-4.5 Mcg/act Aero (Budesonide-formoterol fumarate) .... Two times a day 12)  Amitiza 8 Mcg Caps (Lubiprostone) .Marland Kitchen.. 1 once daily 13)  Restasis 0.05 % Emul (Cyclosporine) .Marland Kitchen.. 1 drop bothe eyes bid 14)  Prenatal/iron Tabs (Prenatal multivit-min-fe-fa) .... May substitute  one by mouth daily 15)  Torsemide 100 Mg Tabs (Torsemide) .... One by mouth q am 16)  Rosac 10-5 % Crea (Sulfacetamide-sulfur-sunscreen) .... Apply q hs 17)  Levsin/sl 0.125 Mg Subl (Hyoscyamine sulfate) .... One sl q 2-4 hours prn 18)  Vimovo 500-20 Mg Tbec (Naproxen-esomeprazole) .... One by mouth daily  Patient Instructions: 1)  Please schedule a follow-up appointment in 6 weeks. 2)  TSH prior to visit, ICD-9: 244.8 3)  T3 and t4 free  244.8 Prescriptions: NASONEX 50 MCG/ACT SUSP (MOMETASONE FUROATE) two sprays in each nostril daily  #1 x 11   Entered and Authorized by:   Ricard Dillon MD   Signed by:   Ricard Dillon MD on 01/31/2010   Method used:   Electronically to        [facility] (retail)       [address] [place], [place]  [id number]       Ph: [phone]       Fax: [phone]   RxID:   [id number] ARMOUR THYROID 90 MG TABS (THYROID) one by mouth daily  #30 x 11   Entered and Authorized by:   Ricard Dillon MD   Signed by:   Ricard Dillon MD on 01/31/2010   Method  used:   Electronically to        [facility] (retail)       [address] [place], [place]  [id number]       Ph: [phone]       Fax: [phone]   RxID:   [id number] SYMBICORT 160-4.5 MCG/ACT  AERO (BUDESONIDE-FORMOTEROL FUMARATE) two times a day  #1 x 11   Entered by:   Allyne Gee, LPN   Authorized by:   Ricard Dillon MD   Signed by:   Allyne Gee, LPN on 075-GRM   Method used:   Electronically to        [facility]* (retail)       [address] [place], [place]  [id number]       Ph: [phone]       Fax: [phone]   RxID:   [id number] VERAMYST 27.5 MCG/SPRAY  SUSP (FLUTICASONE FUROATE) as needed  #1 x 11   Entered by:   Allyne Gee, LPN   Authorized by:   Ricard Dillon MD   Signed by:   Allyne Gee, LPN on 075-GRM   Method used:   Electronically to        [facility]* (retail)       [address] [place], [place]  [id number]       Ph: [phone]       Fax: [phone]   RxID:   [id number] [place] 2.5-0.5 MG/3ML  SOLN (ALBUTEROL-IPRATROPIUM) as needed  #1 x 6   Entered by:   Allyne Gee, LPN   Authorized by:   Ricard Dillon MD   Signed by:   Allyne Gee, LPN on 075-GRM   Method used:   Electronically to        [facility]* (retail)       [address] [place], [place]  [id number]       Ph: [phone]  Fax: [phone]   [id number]

## 2010-10-17 NOTE — Assessment & Plan Note (Signed)
Summary: 3 MONTH ROV/NJR/PT RSC/CJR   Vital Signs:  Patient profile:   65 year old female Height:      65 inches Weight:      207 pounds BMI:     34.57 Temp:     98.2 degrees F oral Pulse rate:   76 / minute Resp:     14 per minute BP sitting:   130 / 76  (left arm)  Vitals Entered By: Allyne Gee, LPN (April 19, 624THL 075-GRM PM) CC: ro   CC:  ro.  History of Present Illness: inability to loose weight has a hx of hypothyroid monitered  01/2009  Preventive Screening-Counseling & Management  Alcohol-Tobacco     Smoking Status: never  Current Problems (verified): 1)  Plantar Fasciitis, Left  (ICD-728.71) 2)  Onychomycosis  (ICD-110.1) 3)  Rosacea  (ICD-695.3) 4)  Anemia, B12 Deficiency  (ICD-281.1) 5)  Unspecified Iron Deficiency Anemia  (ICD-280.9) 6)  Adverse Drug Reaction  (ICD-995.20) 7)  Edema  (ICD-782.3) 8)  Loc Osteoarthros Not Spec Prim/sec Lower Leg  (ICD-715.36) 9)  Hemangioma of Skin and Subcutaneous Tissue  (ICD-228.01) 10)  Acute Bronchitis  (ICD-466.0) 11)  Celiac Disease  (ICD-579.0) 12)  Other Specified Intestinal Malabsorption  (ICD-579.8) 13)  Osteoporosis  (ICD-733.00) 14)  Hypertension  (ICD-401.9) 15)  Anxiety  (ICD-300.00) 16)  Allergic Rhinitis  (ICD-477.9) 17)  Venous Insufficiency, Chronic  (ICD-459.81) 18)  Obesity  (ICD-278.00) 19)  Hypothyroidism  (ICD-244.9) 20)  Gerd  (ICD-530.81) 21)  Asthma  (ICD-493.90)  Current Medications (verified): 1)  Synthroid 75 Mcg  Tabs (Levothyroxine Sodium) .... Take 1 Tablet By Mouth Once A Day 2)  Xanax 0.25 Mg  Tabs (Alprazolam) .... Three Times A Day As Needed 3)  Wellbutrin Xl 150 Mg  Tb24 (Bupropion Hcl) .... 2 Every Morning 1 Every Afternoon 4)  Allegra-D 12 Hour 60-120 Mg  Tb12 (Fexofenadine-Pseudoephedrine) .... Once Daily As Needed 5)  Potassium Chloride Cr 10 Meq  Tbcr (Potassium Chloride) .... Once Daily 6)  Micardis 40 Mg  Tabs (Telmisartan) .... Take 1/2  Tablet By Mouth Once A Day 7)   Singulair 10 Mg  Tabs (Montelukast Sodium) .... Take 1 Tablet By Mouth Once A Day 8)  Zegerid 40-1100 Mg  Caps (Omeprazole-Sodium Bicarbonate) .... Take 1 Capsule By Mouth At Bedtime 9)  Duoneb 2.5-0.5 Mg/55ml  Soln (Albuterol-Ipratropium) .... As Needed 10)  Veramyst 27.5 Mcg/spray  Susp (Fluticasone Furoate) .... As Needed 11)  Symbicort 160-4.5 Mcg/act  Aero (Budesonide-Formoterol Fumarate) .... Two Times A Day 12)  Amitiza 8 Mcg  Caps (Lubiprostone) .Marland Kitchen.. 1 Once Daily 13)  Restasis 0.05 % Emul (Cyclosporine) .Marland Kitchen.. 1 Drop Bothe Eyes Bid 14)  Prenatal/iron  Tabs (Prenatal Multivit-Min-Fe-Fa) .... May Substitute  One By Mouth Daily 15)  Torsemide 100 Mg Tabs (Torsemide) .... One By Mouth Q Am 16)  Rosac 10-5 % Crea (Sulfacetamide-Sulfur-Sunscreen) .... Apply Q Hs 17)  Levsin/sl 0.125 Mg Subl (Hyoscyamine Sulfate) .... One Sl Q 2-4 Hours Prn 18)  Vimovo 500-20 Mg Tbec (Naproxen-Esomeprazole) .... One By Mouth Daily  Allergies (verified): 1)  ! Penicillin V Potassium (Penicillin V Potassium) 2)  ! Compazine  Past History:  Family History: Last updated: 2009/07/22 mother died of lung cnacer at age 79 father alive at age 64 macular degeneration two brothesr alive and well uncle with colon cancer  Social History: Last updated: 07-22-09 Married Never Smoked Occupation: professional Alcohol use-no Drug use-no Regular exercise-no  Risk Factors: Exercise: no (22-Jul-2009)  Risk Factors: Smoking Status: never (01/03/2010)  Past medical, surgical, family and social histories (including risk factors) reviewed, and no changes noted (except as noted below).  Past Medical History: Reviewed history from 06/14/2008 and no changes required. Allergic rhinitis Anxiety Asthma Hypertension Hypothyroidism GERD SPRUE  Past Surgical History: Reviewed history from 05/06/2007 and no changes required. Carpal tunnel release Tonsillectomy nissan fundiplication arthroscopy rt  knee  Family History: Reviewed history from 07/05/2009 and no changes required. mother died of lung cnacer at age 43 father alive at age 26 macular degeneration two brothesr alive and well uncle with colon cancer  Social History: Reviewed history from 07/05/2009 and no changes required. 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  The patient denies anorexia, fever, weight loss, weight gain, vision loss, decreased hearing, chest pain, syncope, dyspnea on exertion, prolonged cough, headaches, hemoptysis, abdominal pain, melena, hematochezia, severe indigestion/heartburn, hematuria, incontinence, genital sores, muscle weakness, suspicious skin lesions, transient blindness, difficulty walking, depression, unusual weight change, abnormal bleeding, enlarged lymph nodes, angioedema, and breast masses.    Physical Exam  General:  alert, well-developed, and overweight-appearing.   Eyes:  pupils equal and pupils round.   Ears:  R ear normal and L ear normal.   Lungs:  normal respiratory effort and no intercostal retractions.   Heart:  normal rate and regular rhythm.   Abdomen:  soft, non-tender, and normal bowel sounds.   Msk:  normal ROM and joint tenderness.  in the left plantar facia Extremities:  trace left pedal edema and trace right pedal edema.   Neurologic:  alert & oriented X3 and finger-to-nose normal.   Skin:  Intact without suspicious lesions or rashes Psych:  Oriented X3 and good eye contact.     Impression & Recommendations:  Problem # 1:  HYPERTENSION (ICD-401.9)  Her updated medication list for this problem includes:    Micardis 40 Mg Tabs (Telmisartan) .Marland Kitchen... Take 1/2  tablet by mouth once a day    Torsemide 100 Mg Tabs (Torsemide) ..... One by mouth q am  BP today: 130/76 Prior BP: 136/80 (10/07/2009)  Prior 10 Yr Risk Heart  Disease: Not enough information (08/06/2007)  Labs Reviewed: K+: 4.1 (04/28/2009) Creat: : 1.3 (04/28/2009)     Problem # 2:  EDEMA (ICD-782.3) the edema does not explain the weight  problems Her updated medication list for this problem includes:    Torsemide 100 Mg Tabs (Torsemide) ..... One by mouth q am keep a food and weight diary  Problem # 3:  HYPOTHYROIDISM (ICD-244.9)  Her updated medication list for this problem includes:    Synthroid 75 Mcg Tabs (Levothyroxine sodium) .Marland Kitchen... Take 1 tablet by mouth once a day  Labs Reviewed: TSH: 1.527 (01/28/2009)   Free T4: 1.1 (06/14/2008)    HgBA1c: 5.3 (04/28/2009)  Orders: Venipuncture [id number]) TLB-TSH (Thyroid Stimulating Hormone) (84443-TSH) TLB-T4 (Thyrox), Free (84439-FT4R) TLB-T3, Free (Triiodothyronine) (84481-T3FREE)  Problem # 4:  OSTEOARTHROS UNSPEC WHETHER GEN/LOC UNSPEC SITE (ICD-715.90) increase to two times a day  Her updated medication list for this problem includes:    Vimovo 500-20 Mg Tbec (Naproxen-esomeprazole) ..... One by mouth daily the knee is the workmans comp  Discussed use of medications, application of heat or cold, and exercises.   Complete Medication List: 1)  Synthroid 75 Mcg Tabs (Levothyroxine sodium) .... Take 1 tablet by mouth once a day 2)  Xanax  0.25 Mg Tabs (Alprazolam) .... Three times a day as needed 3)  Wellbutrin Xl 150 Mg Tb24 (Bupropion hcl) .... 2 every morning 1 every afternoon 4)  Allegra-d 12 Hour 60-120 Mg Tb12 (Fexofenadine-pseudoephedrine) .... Once daily as needed 5)  Potassium Chloride Cr 10 Meq Tbcr (Potassium chloride) .... Once daily 6)  Micardis 40 Mg Tabs (Telmisartan) .... Take 1/2  tablet by mouth once a day 7)  Singulair 10 Mg Tabs (Montelukast sodium) .... Take 1 tablet by mouth once a day 8)  Zegerid 40-1100 Mg Caps (Omeprazole-sodium bicarbonate) .... Take 1 capsule by mouth at bedtime 9)  Duoneb 2.5-0.5 Mg/70ml Soln (Albuterol-ipratropium) .... As needed 10)   Veramyst 27.5 Mcg/spray Susp (Fluticasone furoate) .... As needed 11)  Symbicort 160-4.5 Mcg/act Aero (Budesonide-formoterol fumarate) .... Two times a day 12)  Amitiza 8 Mcg Caps (Lubiprostone) .Marland Kitchen.. 1 once daily 13)  Restasis 0.05 % Emul (Cyclosporine) .Marland Kitchen.. 1 drop bothe eyes bid 14)  Prenatal/iron Tabs (Prenatal multivit-min-fe-fa) .... May substitute  one by mouth daily 15)  Torsemide 100 Mg Tabs (Torsemide) .... One by mouth q am 16)  Rosac 10-5 % Crea (Sulfacetamide-sulfur-sunscreen) .... Apply q hs 17)  Levsin/sl 0.125 Mg Subl (Hyoscyamine sulfate) .... One sl q 2-4 hours prn 18)  Vimovo 500-20 Mg Tbec (Naproxen-esomeprazole) .... One by mouth daily  Other Orders: TLB-B12 + Folate Pnl [id number] TLB-CBC Platelet - w/Differential (85025-CBCD)  Patient Instructions: 1)  food diary... every thing you eat and drink 2)  we will check the thyroid levels 3)  Please schedule a follow-up appointment in 1 month.

## 2010-10-17 NOTE — Assessment & Plan Note (Signed)
Summary: 3 month fup//ccm   Vital Signs:  Patient profile:   65 year old female Height:      65 inches Weight:      198 pounds BMI:     33.07 Temp:     98.2 degrees F oral Pulse rate:   72 / minute Resp:     14 per minute BP sitting:   124 / 80  (left arm)  Vitals Entered By: Allyne Gee, LPN (September 27, 624THL 1:28 PM)  Nutrition Counseling: Patient's BMI is greater than 25 and therefore counseled on weight management options. CC: roa Is Patient Diabetic? No   Primary Care Provider:  Ricard Dillon MD  CC:  roa.  History of Present Illness: has been able to loose about 9 lbs has noted "black nails with ridges in the nails has noted a knot on  left side of the neck that appeared 4 months ago and has "grown" slightly but does not hurt  Preventive Screening-Counseling & Management  Alcohol-Tobacco     Smoking Status: never  Current Problems (verified): 1)  Osteoarthros Unspec Whether Gen/loc Unspec Site  (ICD-715.90) 2)  Plantar Fasciitis, Left  (ICD-728.71) 3)  Onychomycosis  (ICD-110.1) 4)  Rosacea  (ICD-695.3) 5)  Anemia, B12 Deficiency  (ICD-281.1) 6)  Unspecified Iron Deficiency Anemia  (ICD-280.9) 7)  Adverse Drug Reaction  (ICD-995.20) 8)  Edema  (ICD-782.3) 9)  Loc Osteoarthros Not Spec Prim/sec Lower Leg  (ICD-715.36) 10)  Hemangioma of Skin and Subcutaneous Tissue  (ICD-228.01) 11)  Acute Bronchitis  (ICD-466.0) 12)  Celiac Disease  (ICD-579.0) 13)  Other Specified Intestinal Malabsorption  (ICD-579.8) 14)  Osteoporosis  (ICD-733.00) 15)  Hypertension  (ICD-401.9) 16)  Anxiety  (ICD-300.00) 17)  Allergic Rhinitis  (ICD-477.9) 18)  Venous Insufficiency, Chronic  (ICD-459.81) 19)  Obesity  (ICD-278.00) 20)  Hypothyroidism  (ICD-244.9) 21)  Gerd  (ICD-530.81) 22)  Asthma  (ICD-493.90)  Current Medications (verified): 1)  Armour Thyroid 90 Mg Tabs (Thyroid) .... One By Mouth Daily 2)  Xanax 0.25 Mg  Tabs (Alprazolam) .... Three Times A Day As  Needed 3)  Wellbutrin Xl 150 Mg  Tb24 (Bupropion Hcl) .... 2 Every Morning 1 Every Afternoon 4)  Allegra-D 12 Hour 60-120 Mg  Tb12 (Fexofenadine-Pseudoephedrine) .... Once Daily As Needed 5)  Potassium Chloride Cr 10 Meq  Tbcr (Potassium Chloride) .... Once Daily 6)  Micardis 40 Mg  Tabs (Telmisartan) .... Take 1/2  Tablet By Mouth Once A Day 7)  Singulair 10 Mg  Tabs (Montelukast Sodium) .... Take 1 Tablet By Mouth Once A Day 8)  Zegerid 40-1100 Mg  Caps (Omeprazole-Sodium Bicarbonate) .... Take 1 Capsule By Mouth At Bedtime 9)  Duoneb 2.5-0.5 Mg/59ml  Soln (Albuterol-Ipratropium) .... As Needed 10)  Nasonex 50 Mcg/act Susp (Mometasone Furoate) .... Two Sprays in Each Nostril Daily As Needed 11)  Symbicort 160-4.5 Mcg/act  Aero (Budesonide-Formoterol Fumarate) .... Two Times A Day  As Needed 12)  Amitiza 8 Mcg  Caps (Lubiprostone) .Marland Kitchen.. 1 Once Daily 13)  Restasis 0.05 % Emul (Cyclosporine) .Marland Kitchen.. 1 Drop Bothe Eyes Bid 14)  Prenatal/iron  Tabs (Prenatal Multivit-Min-Fe-Fa) .... May Substitute  One By Mouth Daily 15)  Torsemide 100 Mg Tabs (Torsemide) .... One By Mouth Q Am 16)  Rosac 10-5 % Crea (Sulfacetamide-Sulfur-Sunscreen) .... Apply Q Hs 17)  Levsin/sl 0.125 Mg Subl (Hyoscyamine Sulfate) .... One Sl Q 2-4 Hours Prn 18)  Vimovo 500-20 Mg Tbec (Naproxen-Esomeprazole) .... One By Mouth Daily 19)  Proventil  Hfa 108 (90 Base) Mcg/act Aers (Albuterol Sulfate) .... Use As Directed As Needed Sob 20)  Phentermine Hcl 37.5 Mg Caps (Phentermine Hcl) .... One By Mouth  Allergies (verified): 1)  ! Penicillin V Potassium (Penicillin V Potassium) 2)  ! Compazine  Past History:  Family History: Last updated: 08-04-09 mother died of lung cnacer at age 71 father alive at age 24 macular degeneration two brothesr alive and well uncle with colon cancer  Social History: Last updated: Aug 04, 2009 Married Never Smoked Occupation: professional Alcohol use-no Drug use-no Regular  exercise-no  Risk Factors: Exercise: no (08-04-2009)  Risk Factors: Smoking Status: never (06/13/2010)  Past medical, surgical, family and social histories (including risk factors) reviewed, and no changes noted (except as noted below).  Past Medical History: Reviewed history from 06/14/2008 and no changes required. Allergic rhinitis Anxiety Asthma Hypertension Hypothyroidism GERD SPRUE  Past Surgical History: Reviewed history from 05/06/2007 and no changes required. Carpal tunnel release Tonsillectomy nissan fundiplication arthroscopy rt knee  Family History: Reviewed history from Aug 04, 2009 and no changes required. mother died of lung cnacer at age 104 father alive at age 95 macular degeneration two brothesr alive and well uncle with colon cancer  Social History: Reviewed history from 2009-08-04 and no changes required. Married Never Smoked Occupation: professional Alcohol use-no Drug use-no Regular exercise-no  Review of Systems       Flu Vaccine Consent Questions     Do you have a history of severe allergic reactions to this vaccine? no    Any prior history of allergic reactions to egg and/or gelatin? no    Do you have a sensitivity to the preservative Thimersol? no    Do you have a past history of Guillan-Barre Syndrome? no    Do you currently have an acute febrile illness? no    Have you ever had a severe reaction to latex? no    Vaccine information given and explained to patient? yes    Are you currently pregnant? no    Lot Number:[id number]   Exp Date:03/17/2011   Site Given  Left Deltoid IM   Physical Exam  General:  alert, well-developed, and overweight-appearing.   Head:  normocephalic and atraumatic.   Eyes:  pupils equal and pupils round.   Ears:  R ear normal and L ear normal.   Nose:  no external deformity and no nasal discharge.   Lungs:  normal respiratory effort and no intercostal retractions.   Heart:  normal rate and regular  rhythm.   Abdomen:  soft, non-tender, and normal bowel sounds.   Pulses:  dilation and increased pulsitile dilatation of left carotid Extremities:  No clubbing, cyanosis, edema, or deformity noted with normal full range of motion of all joints.   Neurologic:  No cranial nerve deficits noted. Station and gait are normal. Plantar reflexes are down-going bilaterally. DTRs are symmetrical throughout. Sensory, motor and coordinative functions appear intact.   Impression & Recommendations:  Problem # 1:  HYPERTENSION (ICD-401.9)  Her updated medication list for this problem includes:    Micardis 40 Mg Tabs (Telmisartan) .Marland Kitchen... Take 1/2  tablet by mouth once a day    Torsemide 100 Mg Tabs (Torsemide) ..... One by mouth q am  BP today: 124/80 Prior BP: 122/86 (03/14/2010)  Prior 10 Yr Risk Heart Disease: Not enough information (08/06/2007)  Labs Reviewed: K+: 4.1 (04/28/2009) Creat: : 1.3 (04/28/2009)     Problem # 2:  ANEURYSM OF ARTERY OF NECK (ICD-442.81)  suspect  aneurysm in left  carotid  Orders: Doppler Referral (Doppler)  Problem # 3:  HYPERTENSION (ICD-401.9)  Her updated medication list for this problem includes:    Micardis 40 Mg Tabs (Telmisartan) .Marland Kitchen... Take 1/2  tablet by mouth once a day    Torsemide 100 Mg Tabs (Torsemide) ..... One by mouth q am  BP today: 124/80 Prior BP: 122/86 (03/14/2010)  Prior 10 Yr Risk Heart Disease: Not enough information (08/06/2007)  Labs Reviewed: K+: 4.1 (04/28/2009) Creat: : 1.3 (04/28/2009)     Problem # 4:  ASTHMA (ICD-493.90)  Her updated medication list for this problem includes:    Singulair 10 Mg Tabs (Montelukast sodium) .Marland Kitchen... Take 1 tablet by mouth once a day    Duoneb 2.5-0.5 Mg/70ml Soln (Albuterol-ipratropium) .Marland Kitchen... As needed    Symbicort 160-4.5 Mcg/act Aero (Budesonide-formoterol fumarate) .Marland Kitchen..Marland Kitchen Two times a day  as needed    Proventil Hfa 108 (90 Base) Mcg/act Aers (Albuterol sulfate) ..... Use as directed as needed  sob  Problem # 5:  HYPOGLYCEMIA, REACTIVE (ICD-251.2) mid meal protein snaks  Complete Medication List: 1)  Armour Thyroid 90 Mg Tabs (Thyroid) .... One by mouth daily 2)  Xanax 0.25 Mg Tabs (Alprazolam) .... Three times a day as needed 3)  Wellbutrin Xl 150 Mg Tb24 (Bupropion hcl) .... 2 every morning 1 every afternoon 4)  Allegra-d 12 Hour 60-120 Mg Tb12 (Fexofenadine-pseudoephedrine) .... Once daily as needed 5)  Potassium Chloride Cr 10 Meq Tbcr (Potassium chloride) .... Once daily 6)  Micardis 40 Mg Tabs (Telmisartan) .... Take 1/2  tablet by mouth once a day 7)  Singulair 10 Mg Tabs (Montelukast sodium) .... Take 1 tablet by mouth once a day 8)  Zegerid 40-1100 Mg Caps (Omeprazole-sodium bicarbonate) .... Take 1 capsule by mouth at bedtime 9)  Duoneb 2.5-0.5 Mg/7ml Soln (Albuterol-ipratropium) .... As needed 10)  Nasonex 50 Mcg/act Susp (Mometasone furoate) .... Two sprays in each nostril daily as needed 11)  Symbicort 160-4.5 Mcg/act Aero (Budesonide-formoterol fumarate) .... Two times a day  as needed 12)  Amitiza 8 Mcg Caps (Lubiprostone) .Marland Kitchen.. 1 once daily 13)  Restasis 0.05 % Emul (Cyclosporine) .Marland Kitchen.. 1 drop bothe eyes bid 14)  Prenatal/iron Tabs (Prenatal multivit-min-fe-fa) .... May substitute  one by mouth daily 15)  Torsemide 100 Mg Tabs (Torsemide) .... One by mouth q am 16)  Rosac 10-5 % Crea (Sulfacetamide-sulfur-sunscreen) .... Apply q hs 17)  Levsin/sl 0.125 Mg Subl (Hyoscyamine sulfate) .... One sl q 2-4 hours prn 18)  Vimovo 500-20 Mg Tbec (Naproxen-esomeprazole) .... One by mouth daily 19)  Proventil Hfa 108 (90 Base) Mcg/act Aers (Albuterol sulfate) .... Use as directed as needed sob 20)  Phentermine Hcl 37.5 Mg Caps (Phentermine hcl) .... One by mouth 21)  Terbinafine Hcl 250 Mg Tabs (Terbinafine hcl) .... One by mouth daily  Other Orders: Admin 1st Vaccine [id number]) Flu Vaccine 33yrs + [phone])  Patient Instructions: 1)  add lecithin as a supplement , use cuticle  oil regularly 2)  mid morning  and mid afternoo protein snaks 3)  Please schedule a follow-up appointment in 3 months. Prescriptions: TERBINAFINE HCL 250 MG TABS (TERBINAFINE HCL) one by mouth daily  #30 x 3   Entered and Authorized by:   Ricard Dillon MD   Signed by:   Ricard Dillon MD on 06/13/2010   Method used:   Electronically to        [facility] (retail)       [address] [facility]  [place], [place]  [id number]       Ph: [phone]       Fax: [phone]   RxID:   [id number] PHENTERMINE HCL 37.5 MG CAPS (PHENTERMINE HCL) one by mouth  #30 x 2   Entered and Authorized by:   Ricard Dillon MD   Signed by:   Ricard Dillon MD on 06/13/2010   Method used:   Print then Give to Patient   RxID:   [id number]

## 2010-10-17 NOTE — Assessment & Plan Note (Signed)
Summary: 3 MO ROV/MM   Vital Signs:  Patient profile:   65 year old female Height:      65 inches Weight:      204 pounds BMI:     34.07 Temp:     98.2 degrees F oral Pulse rate:   76 / minute Resp:     14 per minute BP sitting:   136 / 80  (left arm)  Vitals Entered By: Allyne Gee, LPN (January 21, 624THL 2:12 PM) CC: roa, Hypertension Management   CC:  roa and Hypertension Management.  History of Present Illness: GERD stable except a feeling of abdominla bloating has been on celebrex for several weeks from her orthopedist she has a follow up and may require knee injections this may be a celebrex effect her asthma has been stable and the HTN is stable   Hypertension History:      She denies headache, chest pain, palpitations, dyspnea with exertion, orthopnea, PND, peripheral edema, visual symptoms, neurologic problems, syncope, and side effects from treatment.        Positive major cardiovascular risk factors include female age 52 years old or older and hypertension.  Negative major cardiovascular risk factors include non-tobacco-user status.     Preventive Screening-Counseling & Management  Alcohol-Tobacco     Smoking Status: never  Problems Prior to Update: 1)  Plantar Fasciitis, Left  (ICD-728.71) 2)  Onychomycosis  (ICD-110.1) 3)  Rosacea  (ICD-695.3) 4)  Anemia, B12 Deficiency  (ICD-281.1) 5)  Unspecified Iron Deficiency Anemia  (ICD-280.9) 6)  Adverse Drug Reaction  (ICD-995.20) 7)  Edema  (ICD-782.3) 8)  Arthropathy Assoc W/gi Conds Oth Than Infections  (ICD-713.1) 9)  Hemangioma of Skin and Subcutaneous Tissue  (ICD-228.01) 10)  Acute Bronchitis  (ICD-466.0) 11)  Celiac Disease  (ICD-579.0) 12)  Other Specified Intestinal Malabsorption  (ICD-579.8) 13)  Osteoporosis  (ICD-733.00) 14)  Hypertension  (ICD-401.9) 15)  Anxiety  (ICD-300.00) 16)  Allergic Rhinitis  (ICD-477.9) 17)  Venous Insufficiency, Chronic  (ICD-459.81) 18)  Obesity   (ICD-278.00) 19)  Hypothyroidism  (ICD-244.9) 20)  Gerd  (ICD-530.81) 21)  Asthma  (ICD-493.90)  Medications Prior to Update: 1)  Synthroid 75 Mcg  Tabs (Levothyroxine Sodium) .... Take 1 Tablet By Mouth Once A Day 2)  Xanax 0.25 Mg  Tabs (Alprazolam) .... Three Times A Day As Needed 3)  Wellbutrin Xl 150 Mg  Tb24 (Bupropion Hcl) .... 2 Every Morning 1 Every Afternoon 4)  Allegra-D 12 Hour 60-120 Mg  Tb12 (Fexofenadine-Pseudoephedrine) .... Once Daily As Needed 5)  Potassium Chloride Cr 10 Meq  Tbcr (Potassium Chloride) .... Once Daily 6)  Micardis 40 Mg  Tabs (Telmisartan) .... Take 1/2  Tablet By Mouth Once A Day 7)  Singulair 10 Mg  Tabs (Montelukast Sodium) .... Take 1 Tablet By Mouth Once A Day 8)  Zegerid 40-1100 Mg  Caps (Omeprazole-Sodium Bicarbonate) .... Take 1 Capsule By Mouth At Bedtime 9)  Duoneb 2.5-0.5 Mg/54ml  Soln (Albuterol-Ipratropium) .... As Needed 10)  Veramyst 27.5 Mcg/spray  Susp (Fluticasone Furoate) .... As Needed 11)  Symbicort 160-4.5 Mcg/act  Aero (Budesonide-Formoterol Fumarate) .... Two Times A Day 12)  Amitiza 8 Mcg  Caps (Lubiprostone) .Marland Kitchen.. 1 Once Daily 13)  Restasis 0.05 % Emul (Cyclosporine) .Marland Kitchen.. 1 Drop Bothe Eyes Bid 14)  Prenatal/iron  Tabs (Prenatal Multivit-Min-Fe-Fa) .... May Substitute  One By Mouth Daily 15)  Torsemide 100 Mg Tabs (Torsemide) .... One By Mouth Q Am 16)  Rosac 10-5 % Crea (Sulfacetamide-Sulfur-Sunscreen) .Marland KitchenMarland KitchenMarland Kitchen  Apply Q Hs 17)  Levsin/sl 0.125 Mg Subl (Hyoscyamine Sulfate) .... One Sl Q 2-4 Hours Prn 18)  Meloxicam 15 Mg Tabs (Meloxicam) .Marland Kitchen.. 1 Once Daily 19)  Doxycycline Hyclate 100 Mg Caps (Doxycycline Hyclate) .Marland Kitchen.. 1 Once Daily  Current Medications (verified): 1)  Synthroid 75 Mcg  Tabs (Levothyroxine Sodium) .... Take 1 Tablet By Mouth Once A Day 2)  Xanax 0.25 Mg  Tabs (Alprazolam) .... Three Times A Day As Needed 3)  Wellbutrin Xl 150 Mg  Tb24 (Bupropion Hcl) .... 2 Every Morning 1 Every Afternoon 4)  Allegra-D 12 Hour 60-120  Mg  Tb12 (Fexofenadine-Pseudoephedrine) .... Once Daily As Needed 5)  Potassium Chloride Cr 10 Meq  Tbcr (Potassium Chloride) .... Once Daily 6)  Micardis 40 Mg  Tabs (Telmisartan) .... Take 1/2  Tablet By Mouth Once A Day 7)  Singulair 10 Mg  Tabs (Montelukast Sodium) .... Take 1 Tablet By Mouth Once A Day 8)  Zegerid 40-1100 Mg  Caps (Omeprazole-Sodium Bicarbonate) .... Take 1 Capsule By Mouth At Bedtime 9)  Duoneb 2.5-0.5 Mg/56ml  Soln (Albuterol-Ipratropium) .... As Needed 10)  Veramyst 27.5 Mcg/spray  Susp (Fluticasone Furoate) .... As Needed 11)  Symbicort 160-4.5 Mcg/act  Aero (Budesonide-Formoterol Fumarate) .... Two Times A Day 12)  Amitiza 8 Mcg  Caps (Lubiprostone) .Marland Kitchen.. 1 Once Daily 13)  Restasis 0.05 % Emul (Cyclosporine) .Marland Kitchen.. 1 Drop Bothe Eyes Bid 14)  Prenatal/iron  Tabs (Prenatal Multivit-Min-Fe-Fa) .... May Substitute  One By Mouth Daily 15)  Torsemide 100 Mg Tabs (Torsemide) .... One By Mouth Q Am 16)  Rosac 10-5 % Crea (Sulfacetamide-Sulfur-Sunscreen) .... Apply Q Hs 17)  Levsin/sl 0.125 Mg Subl (Hyoscyamine Sulfate) .... One Sl Q 2-4 Hours Prn 18)  Doxycycline Hyclate 100 Mg Caps (Doxycycline Hyclate) .Marland Kitchen.. 1 Once Daily 19)  Vimovo 500-20 Mg Tbec (Naproxen-Esomeprazole) .... One By Mouth Daily  Allergies (verified): 1)  ! Penicillin V Potassium (Penicillin V Potassium) 2)  ! Compazine  Past History:  Family History: Last updated: 07-18-09 mother died of lung cnacer at age 57 father alive at age 59 macular degeneration two brothesr alive and well uncle with colon cancer  Social History: Last updated: 2009/07/18 Married Never Smoked Occupation: professional Alcohol use-no Drug use-no Regular exercise-no  Risk Factors: Exercise: no (2009-07-18)  Risk Factors: Smoking Status: never (10/07/2009)  Past medical, surgical, family and social histories (including risk factors) reviewed, and no changes noted (except as noted below).  Past Medical  History: Reviewed history from 06/14/2008 and no changes required. Allergic rhinitis Anxiety Asthma Hypertension Hypothyroidism GERD SPRUE  Past Surgical History: Reviewed history from 05/06/2007 and no changes required. Carpal tunnel release Tonsillectomy nissan fundiplication arthroscopy rt knee  Family History: Reviewed history from 07/18/09 and no changes required. mother died of lung cnacer at age 44 father alive at age 15 macular degeneration two brothesr alive and well uncle with colon cancer  Social History: Reviewed history from 18-Jul-2009 and no changes required. Married Never Smoked Occupation: professional Alcohol use-no Drug use-no Regular exercise-no  Review of Systems       The patient complains of dyspnea on exertion, peripheral edema, prolonged cough, and severe indigestion/heartburn.  The patient denies anorexia, fever, weight loss, weight gain, vision loss, decreased hearing, hoarseness, chest pain, syncope, headaches, hemoptysis, abdominal pain, melena, hematochezia, hematuria, incontinence, genital sores, muscle weakness, suspicious skin lesions, transient blindness, difficulty walking, depression, unusual weight change, abnormal bleeding, enlarged lymph nodes, angioedema, and breast masses.    Physical Exam  General:  alert, well-developed, and overweight-appearing.   Head:  normocephalic and atraumatic.   Eyes:  pupils equal and pupils round.   Ears:  R ear normal and L ear normal.   Lungs:  normal respiratory effort and no intercostal retractions.   Heart:  normal rate and regular rhythm.   Abdomen:  soft, non-tender, and normal bowel sounds.   Msk:  normal ROM and joint tenderness.  in the left plantar facia Extremities:  trace left pedal edema and trace right pedal edema.   Neurologic:  alert & oriented X3 and finger-to-nose normal.     Impression & Recommendations:  Problem # 1:  LOC OSTEOARTHROS NOT SPEC PRIM/SEC LOWER LEG  (ICD-715.36)  The following medications were removed from the medication list:    Meloxicam 15 Mg Tabs (Meloxicam) .Marland Kitchen... 1 once daily Her updated medication list for this problem includes:    Vimovo 500-20 Mg Tbec (Naproxen-esomeprazole) ..... One by mouth daily  Discussed use of medications, application of heat or cold, and exercises.   Problem # 2:  HYPERTENSION (ICD-401.9)  Her updated medication list for this problem includes:    Micardis 40 Mg Tabs (Telmisartan) .Marland Kitchen... Take 1/2  tablet by mouth once a day    Torsemide 100 Mg Tabs (Torsemide) ..... One by mouth q am  BP today: 136/80 Prior BP: 124/76 (07/05/2009)  Prior 10 Yr Risk Heart Disease: Not enough information (08/06/2007)  Labs Reviewed: K+: 4.1 (04/28/2009) Creat: : 1.3 (04/28/2009)     Problem # 3:  HYPOTHYROIDISM (ICD-244.9) stable Her updated medication list for this problem includes:    Synthroid 75 Mcg Tabs (Levothyroxine sodium) .Marland Kitchen... Take 1 tablet by mouth once a day  Labs Reviewed: TSH: 1.527 (01/28/2009)   Free T4: 1.1 (06/14/2008)    HgBA1c: 5.3 (04/28/2009)  Complete Medication List: 1)  Synthroid 75 Mcg Tabs (Levothyroxine sodium) .... Take 1 tablet by mouth once a day 2)  Xanax 0.25 Mg Tabs (Alprazolam) .... Three times a day as needed 3)  Wellbutrin Xl 150 Mg Tb24 (Bupropion hcl) .... 2 every morning 1 every afternoon 4)  Allegra-d 12 Hour 60-120 Mg Tb12 (Fexofenadine-pseudoephedrine) .... Once daily as needed 5)  Potassium Chloride Cr 10 Meq Tbcr (Potassium chloride) .... Once daily 6)  Micardis 40 Mg Tabs (Telmisartan) .... Take 1/2  tablet by mouth once a day 7)  Singulair 10 Mg Tabs (Montelukast sodium) .... Take 1 tablet by mouth once a day 8)  Zegerid 40-1100 Mg Caps (Omeprazole-sodium bicarbonate) .... Take 1 capsule by mouth at bedtime 9)  Duoneb 2.5-0.5 Mg/76ml Soln (Albuterol-ipratropium) .... As needed 10)  Veramyst 27.5 Mcg/spray Susp (Fluticasone furoate) .... As needed 11)  Symbicort  160-4.5 Mcg/act Aero (Budesonide-formoterol fumarate) .... Two times a day 12)  Amitiza 8 Mcg Caps (Lubiprostone) .Marland Kitchen.. 1 once daily 13)  Restasis 0.05 % Emul (Cyclosporine) .Marland Kitchen.. 1 drop bothe eyes bid 14)  Prenatal/iron Tabs (Prenatal multivit-min-fe-fa) .... May substitute  one by mouth daily 15)  Torsemide 100 Mg Tabs (Torsemide) .... One by mouth q am 16)  Rosac 10-5 % Crea (Sulfacetamide-sulfur-sunscreen) .... Apply q hs 17)  Levsin/sl 0.125 Mg Subl (Hyoscyamine sulfate) .... One sl q 2-4 hours prn 18)  Doxycycline Hyclate 100 Mg Caps (Doxycycline hyclate) .Marland Kitchen.. 1 once daily 19)  Vimovo 500-20 Mg Tbec (Naproxen-esomeprazole) .... One by mouth daily  Hypertension Assessment/Plan:      The patient's hypertensive risk group is category B: At least one risk factor (excluding diabetes) with no target organ damage.  Today's blood pressure is 136/80.  Her blood pressure goal is < 140/90.  Patient Instructions: 1)  Please schedule a follow-up appointment in 3 months. Prescriptions: VIMOVO 500-20 MG TBEC (NAPROXEN-ESOMEPRAZOLE) one by mouth daily  #30 x 0   Entered and Authorized by:   Ricard Dillon MD   Signed by:   Ricard Dillon MD on 10/07/2009   Method used:   Electronically to        [facility] (retail)       [place], [place]  [id number]       Ph: [phone]       Fax: [phone]   RxID:   [id number]

## 2010-10-17 NOTE — Assessment & Plan Note (Signed)
Summary: 6 week fup//ccm   Vital Signs:  Patient profile:   65 year old female Weight:      206 pounds Temp:     98.9 degrees F oral Pulse rate:   72 / minute Pulse rhythm:   regular BP sitting:   122 / 86  (left arm) Cuff size:   regular  Vitals Entered By: Westley Hummer CMA Deborra Medina) (March 14, 2010 12:01 PM) CC: follow-up visit and lab results, Hypertension Management Is Patient Diabetic? No Pain Assessment Patient in pain? no        CC:  follow-up visit and lab results and Hypertension Management.  History of Present Illness: monitering of the thyroid for stability on the armour thyroid ( recent change) the swelling has improved, weigth loss has resumed we diecussed exercize and diet and found that the pt was eating one meal a day we reviewed the metabolic implications of a single meal and encouraged 4-5 meals a day asthma was stable, samples on singular were given htn was stable  Hypertension History:      She denies headache, chest pain, palpitations, dyspnea with exertion, orthopnea, PND, peripheral edema, visual symptoms, neurologic problems, syncope, and side effects from treatment.        Positive major cardiovascular risk factors include female age 25 years old or older and hypertension.  Negative major cardiovascular risk factors include non-tobacco-user status.     Problems Prior to Update: 1)  Osteoarthros Unspec Whether Gen/loc Unspec Site  (ICD-715.90) 2)  Plantar Fasciitis, Left  (ICD-728.71) 3)  Onychomycosis  (ICD-110.1) 4)  Rosacea  (ICD-695.3) 5)  Anemia, B12 Deficiency  (ICD-281.1) 6)  Unspecified Iron Deficiency Anemia  (ICD-280.9) 7)  Adverse Drug Reaction  (ICD-995.20) 8)  Edema  (ICD-782.3) 9)  Loc Osteoarthros Not Spec Prim/sec Lower Leg  (ICD-715.36) 10)  Hemangioma of Skin and Subcutaneous Tissue  (ICD-228.01) 11)  Acute Bronchitis  (ICD-466.0) 12)  Celiac Disease  (ICD-579.0) 13)  Other Specified Intestinal Malabsorption  (ICD-579.8) 14)   Osteoporosis  (ICD-733.00) 15)  Hypertension  (ICD-401.9) 16)  Anxiety  (ICD-300.00) 17)  Allergic Rhinitis  (ICD-477.9) 18)  Venous Insufficiency, Chronic  (ICD-459.81) 19)  Obesity  (ICD-278.00) 20)  Hypothyroidism  (ICD-244.9) 21)  Gerd  (ICD-530.81) 22)  Asthma  (ICD-493.90)  Medications Prior to Update: 1)  Armour Thyroid 90 Mg Tabs (Thyroid) .... One By Mouth Daily 2)  Xanax 0.25 Mg  Tabs (Alprazolam) .... Three Times A Day As Needed 3)  Wellbutrin Xl 150 Mg  Tb24 (Bupropion Hcl) .... 2 Every Morning 1 Every Afternoon 4)  Allegra-D 12 Hour 60-120 Mg  Tb12 (Fexofenadine-Pseudoephedrine) .... Once Daily As Needed 5)  Potassium Chloride Cr 10 Meq  Tbcr (Potassium Chloride) .... Once Daily 6)  Micardis 40 Mg  Tabs (Telmisartan) .... Take 1/2  Tablet By Mouth Once A Day 7)  Singulair 10 Mg  Tabs (Montelukast Sodium) .... Take 1 Tablet By Mouth Once A Day 8)  Zegerid 40-1100 Mg  Caps (Omeprazole-Sodium Bicarbonate) .... Take 1 Capsule By Mouth At Bedtime 9)  Duoneb 2.5-0.5 Mg/73ml  Soln (Albuterol-Ipratropium) .... As Needed 10)  Nasonex 50 Mcg/act Susp (Mometasone Furoate) .... Two Sprays in Each Nostril Daily 11)  Symbicort 160-4.5 Mcg/act  Aero (Budesonide-Formoterol Fumarate) .... Two Times A Day 12)  Amitiza 8 Mcg  Caps (Lubiprostone) .Marland Kitchen.. 1 Once Daily 13)  Restasis 0.05 % Emul (Cyclosporine) .Marland Kitchen.. 1 Drop Bothe Eyes Bid 14)  Prenatal/iron  Tabs (Prenatal Multivit-Min-Fe-Fa) .... May Substitute  One  By Mouth Daily 15)  Torsemide 100 Mg Tabs (Torsemide) .... One By Mouth Q Am 16)  Rosac 10-5 % Crea (Sulfacetamide-Sulfur-Sunscreen) .... Apply Q Hs 17)  Levsin/sl 0.125 Mg Subl (Hyoscyamine Sulfate) .... One Sl Q 2-4 Hours Prn 18)  Vimovo 500-20 Mg Tbec (Naproxen-Esomeprazole) .... One By Mouth Daily 19)  Proventil Hfa 108 (90 Base) Mcg/act Aers (Albuterol Sulfate) .... Use As Directed As Needed Sob  Current Medications (verified): 1)  Armour Thyroid 90 Mg Tabs (Thyroid) .... One By  Mouth Daily 2)  Xanax 0.25 Mg  Tabs (Alprazolam) .... Three Times A Day As Needed 3)  Wellbutrin Xl 150 Mg  Tb24 (Bupropion Hcl) .... 2 Every Morning 1 Every Afternoon 4)  Allegra-D 12 Hour 60-120 Mg  Tb12 (Fexofenadine-Pseudoephedrine) .... Once Daily As Needed 5)  Potassium Chloride Cr 10 Meq  Tbcr (Potassium Chloride) .... Once Daily 6)  Micardis 40 Mg  Tabs (Telmisartan) .... Take 1/2  Tablet By Mouth Once A Day 7)  Singulair 10 Mg  Tabs (Montelukast Sodium) .... Take 1 Tablet By Mouth Once A Day 8)  Zegerid 40-1100 Mg  Caps (Omeprazole-Sodium Bicarbonate) .... Take 1 Capsule By Mouth At Bedtime 9)  Duoneb 2.5-0.5 Mg/66ml  Soln (Albuterol-Ipratropium) .... As Needed 10)  Nasonex 50 Mcg/act Susp (Mometasone Furoate) .... Two Sprays in Each Nostril Daily As Needed 11)  Symbicort 160-4.5 Mcg/act  Aero (Budesonide-Formoterol Fumarate) .... Two Times A Day  As Needed 12)  Amitiza 8 Mcg  Caps (Lubiprostone) .Marland Kitchen.. 1 Once Daily 13)  Restasis 0.05 % Emul (Cyclosporine) .Marland Kitchen.. 1 Drop Bothe Eyes Bid 14)  Prenatal/iron  Tabs (Prenatal Multivit-Min-Fe-Fa) .... May Substitute  One By Mouth Daily 15)  Torsemide 100 Mg Tabs (Torsemide) .... One By Mouth Q Am 16)  Rosac 10-5 % Crea (Sulfacetamide-Sulfur-Sunscreen) .... Apply Q Hs 17)  Levsin/sl 0.125 Mg Subl (Hyoscyamine Sulfate) .... One Sl Q 2-4 Hours Prn 18)  Vimovo 500-20 Mg Tbec (Naproxen-Esomeprazole) .... One By Mouth Daily 19)  Proventil Hfa 108 (90 Base) Mcg/act Aers (Albuterol Sulfate) .... Use As Directed As Needed Sob 20)  Phentermine Hcl 37.5 Mg Caps (Phentermine Hcl) .... One By Mouth  Allergies: 1)  ! Penicillin V Potassium (Penicillin V Potassium) 2)  ! Compazine  Past History:  Family History: Last updated: 07/17/09 mother died of lung cnacer at age 45 father alive at age 86 macular degeneration two brothesr alive and well uncle with colon cancer  Social History: Last updated: 2009-07-17 Married Never Smoked Occupation:  professional Alcohol use-no Drug use-no Regular exercise-no  Risk Factors: Exercise: no (2009/07/17)  Risk Factors: Smoking Status: never (01/31/2010)  Past medical, surgical, family and social histories (including risk factors) reviewed, and no changes noted (except as noted below).  Past Medical History: Reviewed history from 06/14/2008 and no changes required. Allergic rhinitis Anxiety Asthma Hypertension Hypothyroidism GERD SPRUE  Past Surgical History: Reviewed history from 05/06/2007 and no changes required. Carpal tunnel release Tonsillectomy nissan fundiplication arthroscopy rt knee  Family History: Reviewed history from 2009-07-17 and no changes required. mother died of lung cnacer at age 6 father alive at age 26 macular degeneration two brothesr alive and well uncle with colon cancer  Social History: Reviewed history from 07-17-09 and no changes required. Married Never Smoked Occupation: professional Alcohol use-no Drug use-no Regular exercise-no  Review of Systems  The patient denies anorexia, fever, weight loss, weight gain, vision loss, decreased hearing, hoarseness, chest pain, syncope, dyspnea on exertion, peripheral edema, prolonged cough, headaches, hemoptysis, abdominal  pain, melena, hematochezia, severe indigestion/heartburn, hematuria, incontinence, genital sores, muscle weakness, suspicious skin lesions, transient blindness, difficulty walking, depression, unusual weight change, abnormal bleeding, enlarged lymph nodes, angioedema, and breast masses.    Physical Exam  General:  alert, well-developed, and overweight-appearing.   Head:  normocephalic and atraumatic.   Eyes:  pupils equal and pupils round.   Ears:  R ear normal and L ear normal.   Nose:  no external deformity and no nasal discharge.   Lungs:  normal respiratory effort and no intercostal retractions.   Heart:  normal rate and regular rhythm.   Abdomen:  soft,  non-tender, and normal bowel sounds.   Msk:  normal ROM and joint tenderness.  in the left plantar facia Extremities:  trace left pedal edema and trace right pedal edema.   Neurologic:  alert & oriented X3 and finger-to-nose normal.     Impression & Recommendations:  Problem # 1:  HYPERTENSION (ICD-401.9)  Her updated medication list for this problem includes:    Micardis 40 Mg Tabs (Telmisartan) .Marland Kitchen... Take 1/2  tablet by mouth once a day    Torsemide 100 Mg Tabs (Torsemide) ..... One by mouth q am  BP today: 122/86 Prior BP: 140/74 (01/31/2010)  Prior 10 Yr Risk Heart Disease: Not enough information (08/06/2007)  Labs Reviewed: K+: 4.1 (04/28/2009) Creat: : 1.3 (04/28/2009)     Problem # 2:  OBESITY (ICD-278.00) weight reducton issues with plateau Ht: 65 (01/31/2010)   Wt: 206 (03/14/2010)   BMI: 34.74 (01/31/2010)  Problem # 3:  HYPOTHYROIDISM (ICD-244.9)  Her updated medication list for this problem includes:    Armour Thyroid 90 Mg Tabs (Thyroid) ..... One by mouth daily  Labs Reviewed: TSH: 0.42 (03/07/2010)   Free T4: 1.01 (03/07/2010)    HgBA1c: 5.3 (04/28/2009)  Problem # 4:  VENOUS INSUFFICIENCY, CHRONIC (ICD-459.81)  Complete Medication List: 1)  Armour Thyroid 90 Mg Tabs (Thyroid) .... One by mouth daily 2)  Xanax 0.25 Mg Tabs (Alprazolam) .... Three times a day as needed 3)  Wellbutrin Xl 150 Mg Tb24 (Bupropion hcl) .... 2 every morning 1 every afternoon 4)  Allegra-d 12 Hour 60-120 Mg Tb12 (Fexofenadine-pseudoephedrine) .... Once daily as needed 5)  Potassium Chloride Cr 10 Meq Tbcr (Potassium chloride) .... Once daily 6)  Micardis 40 Mg Tabs (Telmisartan) .... Take 1/2  tablet by mouth once a day 7)  Singulair 10 Mg Tabs (Montelukast sodium) .... Take 1 tablet by mouth once a day 8)  Zegerid 40-1100 Mg Caps (Omeprazole-sodium bicarbonate) .... Take 1 capsule by mouth at bedtime 9)  Duoneb 2.5-0.5 Mg/90ml Soln (Albuterol-ipratropium) .... As needed 10)   Nasonex 50 Mcg/act Susp (Mometasone furoate) .... Two sprays in each nostril daily as needed 11)  Symbicort 160-4.5 Mcg/act Aero (Budesonide-formoterol fumarate) .... Two times a day  as needed 12)  Amitiza 8 Mcg Caps (Lubiprostone) .Marland Kitchen.. 1 once daily 13)  Restasis 0.05 % Emul (Cyclosporine) .Marland Kitchen.. 1 drop bothe eyes bid 14)  Prenatal/iron Tabs (Prenatal multivit-min-fe-fa) .... May substitute  one by mouth daily 15)  Torsemide 100 Mg Tabs (Torsemide) .... One by mouth q am 16)  Rosac 10-5 % Crea (Sulfacetamide-sulfur-sunscreen) .... Apply q hs 17)  Levsin/sl 0.125 Mg Subl (Hyoscyamine sulfate) .... One sl q 2-4 hours prn 18)  Vimovo 500-20 Mg Tbec (Naproxen-esomeprazole) .... One by mouth daily 19)  Proventil Hfa 108 (90 Base) Mcg/act Aers (Albuterol sulfate) .... Use as directed as needed sob 20)  Phentermine Hcl 37.5 Mg Caps (  Phentermine hcl) .... One by mouth  Hypertension Assessment/Plan:      The patient's hypertensive risk group is category B: At least one risk factor (excluding diabetes) with no target organ damage.  Today's blood pressure is 122/86.  Her blood pressure goal is < 140/90.  Patient Instructions: 1)  Please schedule a follow-up appointment in 3 months. Prescriptions: PHENTERMINE HCL 37.5 MG CAPS (PHENTERMINE HCL) one by mouth  #30 x 3   Entered and Authorized by:   Ricard Dillon MD   Signed by:   Ricard Dillon MD on 03/14/2010   Method used:   Print then Give to Patient   RxID:   [id number]

## 2010-10-17 NOTE — Letter (Signed)
Summary: [facility]   Imported By: Laural Benes 11/16/2009 15:49:56  _____________________________________________________________________  External Attachment:    Type:   Image     Comment:   External Document

## 2010-10-19 NOTE — Assessment & Plan Note (Signed)
Summary: 3 MONTH FOLLOW UP/CJR/pt rescd from bump//ccm   Vital Signs:  Patient profile:   65 year old female Height:      65 inches Weight:      197 pounds BMI:     32.90 Temp:     98.2 degrees F oral Pulse rate:   80 / minute Resp:     14 per minute BP sitting:   140 / 80  (left arm)  Vitals Entered By: Allyne Gee, LPN (January  4, [id number] 1:55 PM) CC: roa Is Patient Diabetic? No   Primary Care Provider:  Ricard Dillon MD  CC:  roa.  History of Present Illness: the pt repsents fro follow up fo chronic cough with some worsening of cough... had seen Dr Elease Hashimoto and was given hydrocodone cough meds ( she had previously tried delsyn and tessilon perles. Has noted green discharge and dried blood from nose. Has noted ild if any heartburn. HAs diffuse myagia. Startes 3 weeks ago. Moderate wheezing with the cough has noted cramps in left hand   Preventive Screening-Counseling & Management  Alcohol-Tobacco     Smoking Status: never  Problems Prior to Update: 1)  Hypoglycemia, Reactive  (ICD-251.2) 2)  Aneurysm of Artery of Neck  (ICD-442.81) 3)  Osteoarthros Unspec Whether Gen/loc Unspec Site  (ICD-715.90) 4)  Plantar Fasciitis, Left  (ICD-728.71) 5)  Onychomycosis  (ICD-110.1) 6)  Rosacea  (ICD-695.3) 7)  Anemia, B12 Deficiency  (ICD-281.1) 8)  Unspecified Iron Deficiency Anemia  (ICD-280.9) 9)  Adverse Drug Reaction  (ICD-995.20) 10)  Edema  (ICD-782.3) 11)  Loc Osteoarthros Not Spec Prim/sec Lower Leg  (ICD-715.36) 12)  Hemangioma of Skin and Subcutaneous Tissue  (ICD-228.01) 13)  Acute Bronchitis  (ICD-466.0) 14)  Celiac Disease  (ICD-579.0) 15)  Other Specified Intestinal Malabsorption  (ICD-579.8) 16)  Osteoporosis  (ICD-733.00) 17)  Hypertension  (ICD-401.9) 18)  Anxiety  (ICD-300.00) 19)  Allergic Rhinitis  (ICD-477.9) 20)  Venous Insufficiency, Chronic  (ICD-459.81) 21)  Obesity  (ICD-278.00) 22)  Hypothyroidism  (ICD-244.9) 23)  Gerd   (ICD-530.81) 24)  Asthma  (ICD-493.90)  Current Problems (verified): 1)  Hypoglycemia, Reactive  (ICD-251.2) 2)  Aneurysm of Artery of Neck  (ICD-442.81) 3)  Osteoarthros Unspec Whether Gen/loc Unspec Site  (ICD-715.90) 4)  Plantar Fasciitis, Left  (ICD-728.71) 5)  Onychomycosis  (ICD-110.1) 6)  Rosacea  (ICD-695.3) 7)  Anemia, B12 Deficiency  (ICD-281.1) 8)  Unspecified Iron Deficiency Anemia  (ICD-280.9) 9)  Adverse Drug Reaction  (ICD-995.20) 10)  Edema  (ICD-782.3) 11)  Loc Osteoarthros Not Spec Prim/sec Lower Leg  (ICD-715.36) 12)  Hemangioma of Skin and Subcutaneous Tissue  (ICD-228.01) 13)  Acute Bronchitis  (ICD-466.0) 14)  Celiac Disease  (ICD-579.0) 15)  Other Specified Intestinal Malabsorption  (ICD-579.8) 16)  Osteoporosis  (ICD-733.00) 17)  Hypertension  (ICD-401.9) 18)  Anxiety  (ICD-300.00) 19)  Allergic Rhinitis  (ICD-477.9) 20)  Venous Insufficiency, Chronic  (ICD-459.81) 21)  Obesity  (ICD-278.00) 22)  Hypothyroidism  (ICD-244.9) 23)  Gerd  (ICD-530.81) 24)  Asthma  (ICD-493.90)  Medications Prior to Update: 1)  Armour Thyroid 90 Mg Tabs (Thyroid) .... One By Mouth Daily 2)  Xanax 0.25 Mg  Tabs (Alprazolam) .... Three Times A Day As Needed 3)  Wellbutrin Xl 150 Mg  Tb24 (Bupropion Hcl) .... 2 Every Morning 1 Every Afternoon 4)  Allegra-D 12 Hour 60-120 Mg  Tb12 (Fexofenadine-Pseudoephedrine) .... Once Daily As Needed 5)  Potassium Chloride Cr 10 Meq  Tbcr (Potassium Chloride) .Marland KitchenMarland KitchenMarland Kitchen  Once Daily 6)  Micardis 40 Mg  Tabs (Telmisartan) .... Take 1/2  Tablet By Mouth Once A Day 7)  Singulair 10 Mg  Tabs (Montelukast Sodium) .... Take 1 Tablet By Mouth Once A Day 8)  Zegerid 40-1100 Mg  Caps (Omeprazole-Sodium Bicarbonate) .... Take 1 Capsule By Mouth At Bedtime 9)  Duoneb 2.5-0.5 Mg/75ml  Soln (Albuterol-Ipratropium) .... As Needed 10)  Nasonex 50 Mcg/act Susp (Mometasone Furoate) .... Two Sprays in Each Nostril Daily As Needed 11)  Symbicort 160-4.5 Mcg/act  Aero  (Budesonide-Formoterol Fumarate) .... Two Times A Day  As Needed 12)  Amitiza 8 Mcg  Caps (Lubiprostone) .Marland Kitchen.. 1 Once Daily 13)  Restasis 0.05 % Emul (Cyclosporine) .Marland Kitchen.. 1 Drop Bothe Eyes Bid 14)  Prenatal/iron  Tabs (Prenatal Multivit-Min-Fe-Fa) .... May Substitute  One By Mouth Daily 15)  Torsemide 100 Mg Tabs (Torsemide) .... One By Mouth Q Am 16)  Rosac 10-5 % Crea (Sulfacetamide-Sulfur-Sunscreen) .... Apply Q Hs 17)  Levsin/sl 0.125 Mg Subl (Hyoscyamine Sulfate) .... One Sl Q 2-4 Hours Prn 18)  Vimovo 500-20 Mg Tbec (Naproxen-Esomeprazole) .... One By Mouth Daily 19)  Proventil Hfa 108 (90 Base) Mcg/act Aers (Albuterol Sulfate) .... Use As Directed As Needed Sob 20)  Phentermine Hcl 37.5 Mg Caps (Phentermine Hcl) .... One By Mouth 21)  Terbinafine Hcl 250 Mg Tabs (Terbinafine Hcl) .... One By Mouth Daily 22)  Vinate Az 27-1 Mg Tabs (Prenatal Vit W/ Fe Bisg-Fa) .Marland Kitchen.. 1 Once Daily 23)  Hydrocodone-Homatropine 5-1.5 Mg/47ml Syrp (Hydrocodone-Homatropine) .Marland Kitchen.. 1 Tsp By Mouth Q 4-6 Hours As Needed Cough  Current Medications (verified): 1)  Armour Thyroid 90 Mg Tabs (Thyroid) .... One By Mouth Daily 2)  Xanax 0.25 Mg  Tabs (Alprazolam) .... Three Times A Day As Needed 3)  Wellbutrin Xl 150 Mg  Tb24 (Bupropion Hcl) .... 2 Every Morning 1 Every Afternoon 4)  Allegra-D 12 Hour 60-120 Mg  Tb12 (Fexofenadine-Pseudoephedrine) .... Once Daily As Needed 5)  Potassium Chloride Cr 10 Meq  Tbcr (Potassium Chloride) .... Once Daily 6)  Micardis 40 Mg  Tabs (Telmisartan) .... Take 1/2  Tablet By Mouth Once A Day 7)  Singulair 10 Mg  Tabs (Montelukast Sodium) .... Take 1 Tablet By Mouth Once A Day 8)  Zegerid 40-1100 Mg  Caps (Omeprazole-Sodium Bicarbonate) .... Take 1 Capsule By Mouth At Bedtime 9)  Duoneb 2.5-0.5 Mg/30ml  Soln (Albuterol-Ipratropium) .... As Needed 10)  Nasonex 50 Mcg/act Susp (Mometasone Furoate) .... Two Sprays in Each Nostril Daily As Needed 11)  Symbicort 160-4.5 Mcg/act  Aero  (Budesonide-Formoterol Fumarate) .... Two Times A Day  As Needed 12)  Amitiza 8 Mcg  Caps (Lubiprostone) .Marland Kitchen.. 1 Once Daily 13)  Restasis 0.05 % Emul (Cyclosporine) .Marland Kitchen.. 1 Drop Bothe Eyes Bid 14)  Prenatal/iron  Tabs (Prenatal Multivit-Min-Fe-Fa) .... May Substitute  One By Mouth Daily 15)  Torsemide 100 Mg Tabs (Torsemide) .... One By Mouth Q Am 16)  Rosac 10-5 % Crea (Sulfacetamide-Sulfur-Sunscreen) .... Apply Q Hs 17)  Levsin/sl 0.125 Mg Subl (Hyoscyamine Sulfate) .... One Sl Q 2-4 Hours Prn 18)  Vimovo 500-20 Mg Tbec (Naproxen-Esomeprazole) .... One By Mouth Daily 19)  Proventil Hfa 108 (90 Base) Mcg/act Aers (Albuterol Sulfate) .... Use As Directed As Needed Sob 20)  Phentermine Hcl 37.5 Mg Caps (Phentermine Hcl) .... One By Mouth 21)  Terbinafine Hcl 250 Mg Tabs (Terbinafine Hcl) .... One By Mouth Daily 22)  Vinate Az 27-1 Mg Tabs (Prenatal Vit W/ Fe Bisg-Fa) .Marland Kitchen.. 1 Once Daily  23)  Hydrocodone-Homatropine 5-1.5 Mg/54ml Syrp (Hydrocodone-Homatropine) .Marland Kitchen.. 1 Tsp By Mouth Q 4-6 Hours As Needed Cough  Allergies (verified): 1)  ! Penicillin V Potassium (Penicillin V Potassium) 2)  ! Compazine  Past History:  Family History: Last updated: 07/07/2009 mother died of lung cnacer at age 31 father alive at age 71 macular degeneration two brothesr alive and well uncle with colon cancer  Social History: Last updated: 07-07-09 Married Never Smoked Occupation: professional Alcohol use-no Drug use-no Regular exercise-no  Risk Factors: Exercise: no (07/07/09)  Risk Factors: Smoking Status: never (09/20/2010)  Past medical, surgical, family and social histories (including risk factors) reviewed, and no changes noted (except as noted below).  Past Medical History: Reviewed history from 06/14/2008 and no changes required. Allergic rhinitis Anxiety Asthma Hypertension Hypothyroidism GERD SPRUE  Past Surgical History: Reviewed history from 05/06/2007 and no changes  required. Carpal tunnel release Tonsillectomy nissan fundiplication arthroscopy rt knee  Family History: Reviewed history from 07/07/2009 and no changes required. mother died of lung cnacer at age 86 father alive at age 63 macular degeneration two brothesr alive and well uncle with colon cancer  Social History: Reviewed history from 07-Jul-2009 and no changes required. Married Never Smoked Occupation: professional Alcohol use-no Drug use-no Regular exercise-no  Physical Exam  General:  Well-developed,well-nourished,in no acute distress; alert,appropriate and cooperative throughout examination Head:  normocephalic and atraumatic.   Eyes:  pupils equal and pupils round.   Ears:  some cerumen R ear. Nose:  mucosal erythema and mucosal edema.   Mouth:  pharyngeal erythema and posterior lymphoid hypertrophy.   Neck:  No deformities, masses, or tenderness noted. Lungs:  normal respiratory effort, no intercostal retractions, and no wheezes.   Heart:  normal rate and regular rhythm.     Impression & Recommendations:  Problem # 1:  HYPERTENSION (ICD-401.9) Assessment Unchanged  Her updated medication list for this problem includes:    Micardis 40 Mg Tabs (Telmisartan) .Marland Kitchen... Take 1/2  tablet by mouth once a day    Torsemide 100 Mg Tabs (Torsemide) ..... One by mouth q am  BP today: 140/80 Prior BP: 130/82 (09/08/2010)  Prior 10 Yr Risk Heart Disease: Not enough information (08/06/2007)  Labs Reviewed: K+: 4.1 (04/28/2009) Creat: : 1.3 (04/28/2009)     Problem # 2:  ASTHMA (ICD-493.90) Assessment: Deteriorated mixed picture.... not clearly an acute asthma flair but certainly coughing "fits" will cover with a z apck and medrol burst and taper with zutipro for cough control Her updated medication list for this problem includes:    Singulair 10 Mg Tabs (Montelukast sodium) .Marland Kitchen... Take 1 tablet by mouth once a day    Duoneb 2.5-0.5 Mg/53ml Soln (Albuterol-ipratropium)  .Marland Kitchen... As needed    Symbicort 160-4.5 Mcg/act Aero (Budesonide-formoterol fumarate) .Marland Kitchen..Marland Kitchen Two times a day  as needed    Proventil Hfa 108 (90 Base) Mcg/act Aers (Albuterol sulfate) ..... Use as directed as needed sob    Methylprednisolone 4 Mg Tabs (Methylprednisolone) .Marland Kitchen... Three by mouth for 4 days then two by mouth for 4 days then 1 by mouth for 4 days  Pulmonary Functions Reviewed: O2 sat: 95 (09/08/2010)  Problem # 3:  ANEURYSM OF ARTERY OF NECK (ICD-442.81) not anurysmal   mild dilation on [place]  Problem # 4:  COUGH, CHRONIC (ICD-786.2) Assessment: Unchanged  Complete Medication List: 1)  Armour Thyroid 90 Mg Tabs (Thyroid) .... One by mouth daily 2)  Xanax 0.25 Mg Tabs (Alprazolam) .... Three times a day as needed 3)  Wellbutrin  Xl 150 Mg Tb24 (Bupropion hcl) .... 2 every morning 1 every afternoon 4)  Allegra-d 12 Hour 60-120 Mg Tb12 (Fexofenadine-pseudoephedrine) .... Once daily as needed 5)  Potassium Chloride Cr 10 Meq Tbcr (Potassium chloride) .... Once daily 6)  Micardis 40 Mg Tabs (Telmisartan) .... Take 1/2  tablet by mouth once a day 7)  Singulair 10 Mg Tabs (Montelukast sodium) .... Take 1 tablet by mouth once a day 8)  Zegerid 40-1100 Mg Caps (Omeprazole-sodium bicarbonate) .... Take 1 capsule by mouth at bedtime 9)  Duoneb 2.5-0.5 Mg/58ml Soln (Albuterol-ipratropium) .... As needed 10)  Nasonex 50 Mcg/act Susp (Mometasone furoate) .... Two sprays in each nostril daily as needed 11)  Symbicort 160-4.5 Mcg/act Aero (Budesonide-formoterol fumarate) .... Two times a day  as needed 12)  Amitiza 8 Mcg Caps (Lubiprostone) .Marland Kitchen.. 1 once daily 13)  Restasis 0.05 % Emul (Cyclosporine) .Marland Kitchen.. 1 drop bothe eyes bid 14)  Prenatal/iron Tabs (Prenatal multivit-min-fe-fa) .... May substitute  one by mouth daily 15)  Torsemide 100 Mg Tabs (Torsemide) .... One by mouth q am 16)  Rosac 10-5 % Crea (Sulfacetamide-sulfur-sunscreen) .... Apply q hs 17)  Levsin/sl 0.125 Mg Subl (Hyoscyamine sulfate)  .... One sl q 2-4 hours prn 18)  Vimovo 500-20 Mg Tbec (Naproxen-esomeprazole) .... One by mouth daily 19)  Proventil Hfa 108 (90 Base) Mcg/act Aers (Albuterol sulfate) .... Use as directed as needed sob 20)  Phentermine Hcl 37.5 Mg Caps (Phentermine hcl) .... One by mouth 21)  Terbinafine Hcl 250 Mg Tabs (Terbinafine hcl) .... One by mouth daily 22)  Vinate Az 27-1 Mg Tabs (Prenatal vit w/ fe bisg-fa) .Marland Kitchen.. 1 once daily 23)  Azithromycin 250 Mg Tabs (Azithromycin) .... Two by mouth now and then on a day for 4 additional days 24)  Methylprednisolone 4 Mg Tabs (Methylprednisolone) .... Three by mouth for 4 days then two by mouth for 4 days then 1 by mouth for 4 days 25)  Zutripro 60-4-5 Mg/10ml Soln (Pseudoeph-chlorphen-hydrocod) .... One tsp by mouth two times a day  Asthma Management Plan    Plan based on PEF formula: [employer] [employer]: Start Prednisone Taper.    Patient Instructions: 1)  Take your antibiotic as prescribed until ALL of it is gone, but stop if you develop a rash or swelling and contact our office as soon as possible. 2)  Please schedule a follow-up appointment in 2 months. Prescriptions: PHENTERMINE HCL 37.5 MG CAPS (PHENTERMINE HCL) one by mouth  #30 x 2   Entered and Authorized by:   Ricard Dillon MD   Signed by:   Ricard Dillon MD on 09/20/2010   Method used:   Print then Give to Patient   RxID:   [id number] ZUTRIPRO 60-4-5 MG/5ML SOLN (PSEUDOEPH-CHLORPHEN-HYDROCOD) one tsp by mouth two times a day  #6 0z x 0   Entered and Authorized by:   Ricard Dillon MD   Signed by:   Ricard Dillon MD on 09/20/2010   Method used:   Print then Give to Patient   RxID:   [id number] METHYLPREDNISOLONE 4 MG TABS (METHYLPREDNISOLONE) three by mouth for 4 days then two by mouth for 4 days then 1 by mouth for 4 days  #24 x 0   Entered and Authorized by:   Ricard Dillon MD   Signed by:   Ricard Dillon MD on 09/20/2010   Method used:   Electronically to         [facility] (retail)       [address], [place]  [id number]       Ph: [phone]       Fax: [phone]   RxID:   [id number] AZITHROMYCIN 250 MG TABS (AZITHROMYCIN) two by mouth now and then on a day for 4 additional days  #6 x 0   Entered and Authorized by:   Ricard Dillon MD   Signed by:   Ricard Dillon MD on 09/20/2010   Method used:   Electronically to        [facility] (retail)       [address], [place]  [id number]       Ph: [phone]       Fax: [phone]   RxID:   [id number]    Orders Added: 1)  Est. Patient Level IV [id number]

## 2010-10-19 NOTE — Assessment & Plan Note (Signed)
Summary: URI/dm   Vital Signs:  Patient profile:   65 year old female Weight:      194 pounds O2 Sat:      95 % Temp:     98.6 degrees F Pulse rate:   84 / minute BP sitting:   130 / 82  (left arm) Cuff size:   regular  Vitals Entered By: Levora Angel, RN (September 08, 2010 1:14 PM) CC: coughina alot esp at hs. stated fluid behind rt ear   History of Present Illness: Onset one week ago nasal congestion, cough, and R ear fullness. Minimal vertigo.  No fever.  No vomiting or diarrhea. Hx of asthma.  Used OTC Delsym wihtout relief. Also used husbands Best boy. Cough severe at night. Ex-smoker.  Allergies: 1)  ! Penicillin V Potassium (Penicillin V Potassium) 2)  ! Compazine  Past History:  Past Medical History: Last updated: 06/14/2008 Allergic rhinitis Anxiety Asthma Hypertension Hypothyroidism GERD SPRUE PMH reviewed for relevance  Physical Exam  General:  Well-developed,well-nourished,in no acute distress; alert,appropriate and cooperative throughout examination Ears:  some cerumen R ear. Mouth:  Oral mucosa and oropharynx without lesions or exudates.  Teeth in good repair. Neck:  No deformities, masses, or tenderness noted. Lungs:  Normal respiratory effort, chest expands symmetrically. Lungs are clear to auscultation, no crackles or wheezes. Heart:  normal rate and regular rhythm.     Impression & Recommendations:  Problem # 1:  ACUTE BRONCHITIS (ICD-466.0) suspect viral. Her updated medication list for this problem includes:    Allegra-d 12 Hour 60-120 Mg Tb12 (Fexofenadine-pseudoephedrine) ..... Once daily as needed    Singulair 10 Mg Tabs (Montelukast sodium) .Marland Kitchen... Take 1 tablet by mouth once a day    Duoneb 2.5-0.5 Mg/60ml Soln (Albuterol-ipratropium) .Marland Kitchen... As needed    Symbicort 160-4.5 Mcg/act Aero (Budesonide-formoterol fumarate) .Marland Kitchen..Marland Kitchen Two times a day  as needed    Proventil Hfa 108 (90 Base) Mcg/act Aers (Albuterol sulfate) ..... Use as  directed as needed sob    Hydrocodone-homatropine 5-1.5 Mg/61ml Syrp (Hydrocodone-homatropine) .Marland Kitchen... 1 tsp by mouth q 4-6 hours as needed cough  Complete Medication List: 1)  Armour Thyroid 90 Mg Tabs (Thyroid) .... One by mouth daily 2)  Xanax 0.25 Mg Tabs (Alprazolam) .... Three times a day as needed 3)  Wellbutrin Xl 150 Mg Tb24 (Bupropion hcl) .... 2 every morning 1 every afternoon 4)  Allegra-d 12 Hour 60-120 Mg Tb12 (Fexofenadine-pseudoephedrine) .... Once daily as needed 5)  Potassium Chloride Cr 10 Meq Tbcr (Potassium chloride) .... Once daily 6)  Micardis 40 Mg Tabs (Telmisartan) .... Take 1/2  tablet by mouth once a day 7)  Singulair 10 Mg Tabs (Montelukast sodium) .... Take 1 tablet by mouth once a day 8)  Zegerid 40-1100 Mg Caps (Omeprazole-sodium bicarbonate) .... Take 1 capsule by mouth at bedtime 9)  Duoneb 2.5-0.5 Mg/89ml Soln (Albuterol-ipratropium) .... As needed 10)  Nasonex 50 Mcg/act Susp (Mometasone furoate) .... Two sprays in each nostril daily as needed 11)  Symbicort 160-4.5 Mcg/act Aero (Budesonide-formoterol fumarate) .... Two times a day  as needed 12)  Amitiza 8 Mcg Caps (Lubiprostone) .Marland Kitchen.. 1 once daily 13)  Restasis 0.05 % Emul (Cyclosporine) .Marland Kitchen.. 1 drop bothe eyes bid 14)  Prenatal/iron Tabs (Prenatal multivit-min-fe-fa) .... May substitute  one by mouth daily 15)  Torsemide 100 Mg Tabs (Torsemide) .... One by mouth q am 16)  Rosac 10-5 % Crea (Sulfacetamide-sulfur-sunscreen) .... Apply q hs 17)  Levsin/sl 0.125 Mg Subl (Hyoscyamine sulfate) .Marland KitchenMarland KitchenMarland Kitchen  One sl q 2-4 hours prn 18)  Vimovo 500-20 Mg Tbec (Naproxen-esomeprazole) .... One by mouth daily 19)  Proventil Hfa 108 (90 Base) Mcg/act Aers (Albuterol sulfate) .... Use as directed as needed sob 20)  Phentermine Hcl 37.5 Mg Caps (Phentermine hcl) .... One by mouth 21)  Terbinafine Hcl 250 Mg Tabs (Terbinafine hcl) .... One by mouth daily 22)  Vinate Az 27-1 Mg Tabs (Prenatal vit w/ fe bisg-fa) .Marland Kitchen.. 1 once daily 23)   Hydrocodone-homatropine 5-1.5 Mg/35ml Syrp (Hydrocodone-homatropine) .Marland Kitchen.. 1 tsp by mouth q 4-6 hours as needed cough  Patient Instructions: 1)  Acute Bronchitis symptoms for less then 10 days are not  helped by antibiotics. Take over the counter cough medications. Call if no improvement in 5-7 days, sooner if increasing cough, fever, or new symptoms ( shortness of breath, chest pain) .  Prescriptions: HYDROCODONE-HOMATROPINE 5-1.5 MG/5ML SYRP (HYDROCODONE-HOMATROPINE) 1 tsp by mouth q 4-6 hours as needed cough  #120 ml x 0   Entered and Authorized by:   Carolann Littler MD   Signed by:   Carolann Littler MD on 09/08/2010   Method used:   Print then Give to Patient   RxID:   [id number]    Orders Added: 1)  Est. Patient Level III [id number]

## 2010-10-22 ENCOUNTER — Other Ambulatory Visit: Payer: Self-pay | Admitting: Internal Medicine

## 2010-11-21 ENCOUNTER — Other Ambulatory Visit: Payer: Self-pay | Admitting: Internal Medicine

## 2010-11-22 ENCOUNTER — Encounter: Payer: Self-pay | Admitting: Internal Medicine

## 2010-11-22 ENCOUNTER — Ambulatory Visit (INDEPENDENT_AMBULATORY_CARE_PROVIDER_SITE_OTHER): Payer: Medicare Other | Admitting: Internal Medicine

## 2010-11-22 VITALS — BP 132/80 | HR 108 | Temp 98.6°F | Wt 198.0 lb

## 2010-11-22 DIAGNOSIS — E669 Obesity, unspecified: Secondary | ICD-10-CM

## 2010-11-22 DIAGNOSIS — I10 Essential (primary) hypertension: Secondary | ICD-10-CM

## 2010-11-22 DIAGNOSIS — R05 Cough: Secondary | ICD-10-CM

## 2010-11-22 DIAGNOSIS — K219 Gastro-esophageal reflux disease without esophagitis: Secondary | ICD-10-CM

## 2010-11-22 DIAGNOSIS — K900 Celiac disease: Secondary | ICD-10-CM

## 2010-11-22 DIAGNOSIS — J45909 Unspecified asthma, uncomplicated: Secondary | ICD-10-CM

## 2010-11-22 DIAGNOSIS — D509 Iron deficiency anemia, unspecified: Secondary | ICD-10-CM

## 2010-11-22 DIAGNOSIS — R059 Cough, unspecified: Secondary | ICD-10-CM

## 2010-11-22 LAB — CBC WITH DIFFERENTIAL/PLATELET
Eosinophils Relative: 2.7 % (ref 0.0–5.0)
Lymphocytes Relative: 24.6 % (ref 12.0–46.0)
Monocytes Relative: 11.3 % (ref 3.0–12.0)
Neutrophils Relative %: 60.7 % (ref 43.0–77.0)
Platelets: 330 10*3/uL (ref 150.0–400.0)
WBC: 7.8 10*3/uL (ref 4.5–10.5)

## 2010-11-22 LAB — IRON: Iron: 103 ug/dL (ref 42–145)

## 2010-11-22 LAB — TSH: TSH: 0.65 u[IU]/mL (ref 0.35–5.50)

## 2010-11-22 MED ORDER — PHENTERMINE HCL 37.5 MG PO CAPS: 37.5000 mg | ORAL_CAPSULE | Freq: Every day | ORAL | Status: DC

## 2010-11-22 NOTE — Assessment & Plan Note (Signed)
On ultra fiber DS  With good control of diarhea

## 2010-11-22 NOTE — Progress Notes (Signed)
  Subjective:    Patient ID: Leslie Edwards, female    DOB: 03/24/1946, 65 y.o.   MRN: [id number]  HPI patient is an obese 65 year old white female who presents for followup of morbid obesity hypertension celiac disease and hypothyroidism.  She also has a form of anemia and reactive hypoglycemia.   today we reviewed her problems with losing weight and doing her exercise her diet and realizes that she may need a referral to a nutritionist for further help in evaluating.  She is frustrated by her inability to lose weight   Her blood pressure is stable on reduced dose of Micardis    Review of Systems  Constitutional: Negative for activity change, appetite change and fatigue.  HENT: Negative for ear pain, congestion, neck pain, postnasal drip and sinus pressure.   Eyes: Negative for redness and visual disturbance.  Respiratory: Negative for cough, shortness of breath and wheezing.   Gastrointestinal: Positive for diarrhea and abdominal distention.  Genitourinary: Negative for dysuria, frequency and menstrual problem.  Musculoskeletal: Negative for myalgias, joint swelling and arthralgias.  Skin: Negative for rash and wound.  Neurological: Negative for dizziness, weakness and headaches.  Hematological: Negative for adenopathy. Does not bruise/bleed easily.  Psychiatric/Behavioral: Negative for sleep disturbance and decreased concentration.       Objective:   Physical Exam     Patient he is in no apparent distress her blood pressure was 132/80 her pulse was 100 she was afebrile HEENT revealed pupils are equal round at reactive to light and accommodation neck appeared supple  Lung fields were clear to auscultation and percussion heart examination revealed a regular rate and rhythm abdomen soft nontender extremity examination revealed the sinuses clubbing or edema she was appropriate showed no focal neurologic deficit equal grips and normal gait    Assessment & Plan:

## 2010-11-22 NOTE — Assessment & Plan Note (Signed)
The patient Leslie Edwards was reduced to 20 mg by mouth daily and her blood pressure remains in the goal range. she is tolerating the Micardis without any difficulties at this time we'll continue this medication at current dose

## 2010-11-22 NOTE — Assessment & Plan Note (Signed)
Weight is stable despite walking and exercising...  About 70 min a day. Motivated...but frustrated. The patient is very frustrated with her diet at this point she has been exercising on a regular basis has noticed some reduction in her diameter by measurements but has not noticed any weight change at this time.  She has watched her portions controlling her calories avoiding sugared drinks as well as sources of simple sugars but still has not been able to lose weight.  In the past we'll monitor her thyroid in stable and we have seen no other metabolic cause for her weight to be. Difficult to lose at this time.

## 2010-12-07 ENCOUNTER — Other Ambulatory Visit: Payer: Self-pay | Admitting: Internal Medicine

## 2010-12-12 ENCOUNTER — Encounter: Payer: Medicare Other | Attending: Internal Medicine | Admitting: *Deleted

## 2010-12-12 DIAGNOSIS — E669 Obesity, unspecified: Secondary | ICD-10-CM | POA: Insufficient documentation

## 2010-12-12 DIAGNOSIS — Z713 Dietary counseling and surveillance: Secondary | ICD-10-CM | POA: Insufficient documentation

## 2010-12-28 ENCOUNTER — Other Ambulatory Visit: Payer: Self-pay | Admitting: Internal Medicine

## 2011-01-22 ENCOUNTER — Encounter: Payer: Self-pay | Admitting: Internal Medicine

## 2011-01-22 ENCOUNTER — Ambulatory Visit (INDEPENDENT_AMBULATORY_CARE_PROVIDER_SITE_OTHER): Payer: Medicare Other | Admitting: Internal Medicine

## 2011-01-22 DIAGNOSIS — K219 Gastro-esophageal reflux disease without esophagitis: Secondary | ICD-10-CM

## 2011-01-22 DIAGNOSIS — E669 Obesity, unspecified: Secondary | ICD-10-CM

## 2011-01-22 DIAGNOSIS — E039 Hypothyroidism, unspecified: Secondary | ICD-10-CM

## 2011-01-22 DIAGNOSIS — I10 Essential (primary) hypertension: Secondary | ICD-10-CM

## 2011-01-22 NOTE — Patient Instructions (Signed)
One cup of white vinegar and a gallon of water and soak his feet 3 times a week vick vapor rub to the toes and then used a nylon sock

## 2011-01-22 NOTE — Progress Notes (Signed)
Subjective:    Patient ID: Leslie Edwards, female    DOB: 1946-02-20, 65 y.o.   MRN: [id number]  HPI Patient presents for followup of gastroesophageal reflux hypertension fatigue and for monitoring of weight loss.  She has lost 5 pounds since her last office visit which we feel is significant. We reviewed her laboratory values which showed a stable CBC and an iron level approximately 100 as well as a TSH which shows good level of medication replacement.     Review of Systems  Constitutional: Negative for activity change, appetite change and fatigue.  HENT: Negative for ear pain, congestion, neck pain, postnasal drip and sinus pressure.   Eyes: Negative for redness and visual disturbance.  Respiratory: Negative for cough, shortness of breath and wheezing.   Gastrointestinal: Negative for abdominal pain and abdominal distention.  Genitourinary: Negative for dysuria, frequency and menstrual problem.  Musculoskeletal: Negative for myalgias, joint swelling and arthralgias.  Skin: Negative for rash and wound.  Neurological: Negative for dizziness, weakness and headaches.  Hematological: Negative for adenopathy. Does not bruise/bleed easily.  Psychiatric/Behavioral: Negative for sleep disturbance and decreased concentration.   Past Medical History  Diagnosis Date  . Allergy   . Anxiety   . Asthma   . Hypertension   . GERD (gastroesophageal reflux disease)   . Hypothyroidism   . Sprue    Past Surgical History  Procedure Date  . Carpal tunnel release   . Tonsillectomy   . Nissan fundiplication   . Knee arthroscopy     right  . Lipoma excision 1965    axilary    reports that she quit smoking about 36 years ago. She does not have any smokeless tobacco history on file. She reports that she drinks about 1.8 ounces of alcohol per week. She reports that she does not use illicit drugs. family history includes Cancer in her maternal aunt, maternal uncle, and mother and Macular  degeneration in her father. Allergies  Allergen Reactions  . Penicillins     REACTION: difficulty breathing  . Prochlorperazine Edisylate        Objective:   Physical Exam  Constitutional: She is oriented to person, place, and time. She appears well-developed and well-nourished. No distress.  HENT:  Head: Normocephalic and atraumatic.  Right Ear: External ear normal.  Left Ear: External ear normal.  Nose: Nose normal.  Mouth/Throat: Oropharynx is clear and moist.  Eyes: Conjunctivae and EOM are normal. Pupils are equal, round, and reactive to light.  Neck: Normal range of motion. Neck supple. No JVD present. No tracheal deviation present. No thyromegaly present.  Cardiovascular: Normal rate, regular rhythm, normal heart sounds and intact distal pulses.   No murmur heard. Pulmonary/Chest: Effort normal and breath sounds normal. She has no wheezes. She exhibits no tenderness.  Abdominal: Soft. Bowel sounds are normal.  Musculoskeletal: Normal range of motion. She exhibits no edema and no tenderness.  Lymphadenopathy:    She has no cervical adenopathy.  Neurological: She is alert and oriented to person, place, and time. She has normal reflexes. No cranial nerve deficit.  Skin: Skin is warm and dry. She is not diaphoretic.  Psychiatric: She has a normal mood and affect. Her behavior is normal.          Assessment & Plan:  .obesity The patient went to the nutritionist and there was a discussion about increasing protein snacks especially prior to exercise she was given samples of boost high protein and has been taking approximately 1/2 of  a container before exercise.  The concept that the nutritionist was promoting was one of timing the metabolic pump with a protein to prevent hypoglycemia and the need to have a glucose snack as well as helping at cellular level  HTN stable GERD not effected by the protein addition  Hypoglycemia reduced  Muscle weakness... Having problem with  increasing weight.

## 2011-01-25 ENCOUNTER — Other Ambulatory Visit: Payer: Self-pay | Admitting: Internal Medicine

## 2011-02-19 ENCOUNTER — Other Ambulatory Visit: Payer: Self-pay | Admitting: *Deleted

## 2011-03-07 ENCOUNTER — Other Ambulatory Visit: Payer: Self-pay | Admitting: Internal Medicine

## 2011-03-23 ENCOUNTER — Encounter: Payer: Self-pay | Admitting: Internal Medicine

## 2011-04-19 ENCOUNTER — Other Ambulatory Visit: Payer: Self-pay | Admitting: Internal Medicine

## 2011-04-23 ENCOUNTER — Ambulatory Visit (INDEPENDENT_AMBULATORY_CARE_PROVIDER_SITE_OTHER): Payer: Medicare Other | Admitting: Internal Medicine

## 2011-04-23 ENCOUNTER — Encounter: Payer: Self-pay | Admitting: Internal Medicine

## 2011-04-23 VITALS — BP 120/80 | Temp 98.6°F | Ht 65.25 in | Wt 202.0 lb

## 2011-04-23 DIAGNOSIS — E039 Hypothyroidism, unspecified: Secondary | ICD-10-CM

## 2011-04-23 DIAGNOSIS — R05 Cough: Secondary | ICD-10-CM

## 2011-04-23 DIAGNOSIS — M5430 Sciatica, unspecified side: Secondary | ICD-10-CM

## 2011-04-23 DIAGNOSIS — I10 Essential (primary) hypertension: Secondary | ICD-10-CM

## 2011-04-23 DIAGNOSIS — D509 Iron deficiency anemia, unspecified: Secondary | ICD-10-CM

## 2011-04-23 DIAGNOSIS — R059 Cough, unspecified: Secondary | ICD-10-CM

## 2011-04-23 MED ORDER — PREDNISONE 20 MG PO TABS: 20.0000 mg | ORAL_TABLET | Freq: Every day | ORAL | Status: AC

## 2011-04-23 MED ORDER — BACLOFEN 10 MG PO TABS: 10.0000 mg | ORAL_TABLET | Freq: Three times a day (TID) | ORAL | Status: DC

## 2011-04-23 NOTE — Patient Instructions (Signed)
No gym for one week but she can walk for this period of time on level treadmill or on level pavement. You may resume the gym after one week

## 2011-04-23 NOTE — Progress Notes (Signed)
Subjective:    Patient ID: Leslie Edwards, female    DOB: [birth year], 65 y.o.   MRN: [id number]  HPI  Pt had a URI and asthma with increased cough The cough resulted in back pain radiating to buttocks and to mid thigh The pain grabs her and effects walking The pain waxes and wains     Review of Systems  Constitutional: Negative for activity change, appetite change and fatigue.  HENT: Negative for ear pain, congestion, neck pain, postnasal drip and sinus pressure.   Eyes: Negative for redness and visual disturbance.  Respiratory: Negative for cough, shortness of breath and wheezing.   Gastrointestinal: Negative for abdominal pain and abdominal distention.  Genitourinary: Negative for dysuria, frequency and menstrual problem.  Musculoskeletal: Positive for back pain, joint swelling, arthralgias and gait problem. Negative for myalgias.  Skin: Negative for rash and wound.  Neurological: Negative for dizziness, weakness and headaches.  Hematological: Negative for adenopathy. Does not bruise/bleed easily.  Psychiatric/Behavioral: Negative for sleep disturbance and decreased concentration.       Past Medical History  Diagnosis Date  . Allergy   . Anxiety   . Asthma   . Hypertension   . GERD (gastroesophageal reflux disease)   . Hypothyroidism   . Sprue    Past Surgical History  Procedure Date  . Carpal tunnel release   . Tonsillectomy   . Nissan fundiplication   . Knee arthroscopy     right  . Lipoma excision 1965    axilary    reports that she quit smoking about 36 years ago. She does not have any smokeless tobacco history on file. She reports that she drinks about 1.8 ounces of alcohol per week. She reports that she does not use illicit drugs. family history includes Cancer in her maternal aunt, maternal uncle, and mother and Macular degeneration in her father. Allergies  Allergen Reactions  . Penicillins     REACTION: difficulty breathing  . Prochlorperazine  Edisylate     Objective:   Physical Exam  Constitutional: She is oriented to person, place, and time. She appears well-developed and well-nourished. No distress.  HENT:  Head: Normocephalic and atraumatic.  Right Ear: External ear normal.  Left Ear: External ear normal.  Nose: Nose normal.  Mouth/Throat: Oropharynx is clear and moist.  Eyes: Conjunctivae and EOM are normal. Pupils are equal, round, and reactive to light.  Neck: Normal range of motion. Neck supple. No JVD present. No tracheal deviation present. No thyromegaly present.  Cardiovascular: Normal rate, regular rhythm, normal heart sounds and intact distal pulses.   No murmur heard. Pulmonary/Chest: Effort normal and breath sounds normal. She has no wheezes. She exhibits no tenderness.  Abdominal: Soft. Bowel sounds are normal.  Musculoskeletal: Normal range of motion. She exhibits no edema and no tenderness.  Lymphadenopathy:    She has no cervical adenopathy.  Neurological: She is alert and oriented to person, place, and time. She has normal reflexes. No cranial nerve deficit.  Skin: Skin is warm and dry. She is not diaphoretic.  Psychiatric: She has a normal mood and affect. Her behavior is normal.    On physical examination she is an obese white female in no apparent distress      Assessment & Plan:  Possible sciatic nerve pain Discussed treatment for sciatica nerve including heat, stretching and the possibility of steroids a bit in the light of her obesity and its risks I would not want  steroids at this time We discussed weight  loss and exercise including stretching We reviewed her thyroid status T3-T4 and TSH Her CBC was reviewed including a B12 and iron deficiency anemia and the role of treating her anemia and helping her with weight control

## 2011-05-03 ENCOUNTER — Encounter: Payer: Self-pay | Admitting: Internal Medicine

## 2011-05-08 ENCOUNTER — Other Ambulatory Visit: Payer: Self-pay | Admitting: *Deleted

## 2011-06-18 ENCOUNTER — Ambulatory Visit: Payer: Medicare Other | Admitting: Internal Medicine

## 2011-07-23 ENCOUNTER — Ambulatory Visit (INDEPENDENT_AMBULATORY_CARE_PROVIDER_SITE_OTHER): Payer: Medicare Other | Admitting: Internal Medicine

## 2011-07-23 ENCOUNTER — Encounter: Payer: Self-pay | Admitting: Internal Medicine

## 2011-07-23 DIAGNOSIS — J069 Acute upper respiratory infection, unspecified: Secondary | ICD-10-CM

## 2011-07-23 DIAGNOSIS — K900 Celiac disease: Secondary | ICD-10-CM

## 2011-07-23 DIAGNOSIS — D1801 Hemangioma of skin and subcutaneous tissue: Secondary | ICD-10-CM

## 2011-07-23 DIAGNOSIS — E039 Hypothyroidism, unspecified: Secondary | ICD-10-CM

## 2011-07-23 DIAGNOSIS — E669 Obesity, unspecified: Secondary | ICD-10-CM

## 2011-07-23 DIAGNOSIS — J45909 Unspecified asthma, uncomplicated: Secondary | ICD-10-CM

## 2011-07-23 DIAGNOSIS — K219 Gastro-esophageal reflux disease without esophagitis: Secondary | ICD-10-CM

## 2011-07-23 DIAGNOSIS — Z23 Encounter for immunization: Secondary | ICD-10-CM

## 2011-07-23 MED ORDER — METHYLPREDNISOLONE (PAK) 4 MG PO TABS: 4.0000 mg | ORAL_TABLET | Freq: Every day | ORAL | Status: DC

## 2011-07-23 MED ORDER — LEVOFLOXACIN 500 MG PO TABS: 500.0000 mg | ORAL_TABLET | Freq: Every day | ORAL | Status: DC

## 2011-07-23 MED ORDER — LEVOFLOXACIN 500 MG PO TABS: 500.0000 mg | ORAL_TABLET | Freq: Every day | ORAL | Status: AC

## 2011-07-23 NOTE — Patient Instructions (Signed)
The patient is instructed to continue all medications as prescribed. Schedule followup with check out clerk upon leaving the clinic  

## 2011-07-23 NOTE — Progress Notes (Signed)
  Subjective:    Patient ID: Leslie Edwards, female    DOB: Dec 03, 1945, 65 y.o.   MRN: [id number]  HPI Asthma flair Precipitated by a viral upper respiratory tract infection with cough and congestion Flu shot today Shingles next visit     Review of Systems  Constitutional: Negative for activity change, appetite change and fatigue.  HENT: Negative for ear pain, congestion, neck pain, postnasal drip and sinus pressure.   Eyes: Negative for redness and visual disturbance.  Respiratory: Positive for shortness of breath and wheezing. Negative for cough.   Gastrointestinal: Negative for abdominal pain and abdominal distention.  Genitourinary: Negative for dysuria, frequency and menstrual problem.  Musculoskeletal: Negative for myalgias, joint swelling and arthralgias.  Skin: Negative for rash and wound.  Neurological: Negative for dizziness, weakness and headaches.  Hematological: Negative for adenopathy. Does not bruise/bleed easily.  Psychiatric/Behavioral: Negative for sleep disturbance and decreased concentration.   Past Medical History  Diagnosis Date  . Allergy   . Anxiety   . Asthma   . Hypertension   . GERD (gastroesophageal reflux disease)   . Hypothyroidism   . Sprue    Past Surgical History  Procedure Date  . Carpal tunnel release   . Tonsillectomy   . Nissan fundiplication   . Knee arthroscopy     right  . Lipoma excision 1965    axilary    reports that she quit smoking about 36 years ago. She does not have any smokeless tobacco history on file. She reports that she drinks about 1.8 ounces of alcohol per week. She reports that she does not use illicit drugs. family history includes Cancer in her maternal aunt, maternal uncle, and mother and Macular degeneration in her father. Allergies  Allergen Reactions  . Penicillins     REACTION: difficulty breathing  . Prochlorperazine Edisylate         Objective:   Physical Exam  Constitutional: She is oriented to  person, place, and time. She appears well-developed and well-nourished. No distress.       obese  HENT:  Head: Normocephalic and atraumatic.  Right Ear: External ear normal.  Left Ear: External ear normal.  Nose: Nose normal.  Mouth/Throat: Oropharynx is clear and moist.  Eyes: Conjunctivae and EOM are normal. Pupils are equal, round, and reactive to light.  Neck: Normal range of motion. Neck supple. No JVD present. No tracheal deviation present. No thyromegaly present.  Cardiovascular: Normal rate, regular rhythm, normal heart sounds and intact distal pulses.   No murmur heard. Pulmonary/Chest: Effort normal. She has wheezes. She exhibits no tenderness.  Abdominal: Soft. Bowel sounds are normal.  Musculoskeletal: Normal range of motion. She exhibits no edema and no tenderness.  Lymphadenopathy:    She has no cervical adenopathy.  Neurological: She is alert and oriented to person, place, and time. She has normal reflexes. No cranial nerve deficit.  Skin: Skin is warm and dry. She is not diaphoretic.  Psychiatric: She has a normal mood and affect. Her behavior is normal.          Assessment & Plan:  Reviewed mild to moderate asthma flare consistent with full allergies.  Discussed maintenance medications for her asthma and the use of rescue inhaler during an asthma flare.  Her asthma control generally stable. We will treat acute bronchitis as the precipitant of the asthma flare

## 2011-08-30 ENCOUNTER — Other Ambulatory Visit: Payer: Self-pay | Admitting: Internal Medicine

## 2011-09-17 ENCOUNTER — Other Ambulatory Visit: Payer: Self-pay | Admitting: Internal Medicine

## 2011-09-25 ENCOUNTER — Encounter: Payer: Self-pay | Admitting: Internal Medicine

## 2011-09-25 ENCOUNTER — Ambulatory Visit: Payer: Medicare Other | Admitting: Internal Medicine

## 2011-09-25 ENCOUNTER — Ambulatory Visit (INDEPENDENT_AMBULATORY_CARE_PROVIDER_SITE_OTHER): Payer: Medicare Other | Admitting: Internal Medicine

## 2011-09-25 VITALS — BP 118/78 | HR 72 | Temp 98.3°F | Resp 16 | Ht 64.25 in | Wt 202.0 lb

## 2011-09-25 DIAGNOSIS — E039 Hypothyroidism, unspecified: Secondary | ICD-10-CM

## 2011-09-25 DIAGNOSIS — J340 Abscess, furuncle and carbuncle of nose: Secondary | ICD-10-CM

## 2011-09-25 DIAGNOSIS — R682 Dry mouth, unspecified: Secondary | ICD-10-CM

## 2011-09-25 DIAGNOSIS — E669 Obesity, unspecified: Secondary | ICD-10-CM

## 2011-09-25 DIAGNOSIS — K117 Disturbances of salivary secretion: Secondary | ICD-10-CM

## 2011-09-25 DIAGNOSIS — I10 Essential (primary) hypertension: Secondary | ICD-10-CM

## 2011-09-25 LAB — TSH: TSH: 1.23 u[IU]/mL (ref 0.35–5.50)

## 2011-09-25 MED ORDER — MUPIROCIN CALCIUM 2 % NA OINT: TOPICAL_OINTMENT | Freq: Two times a day (BID) | NASAL | Status: AC

## 2011-09-25 MED ORDER — PHENTERMINE HCL 37.5 MG PO CAPS: 37.5000 mg | ORAL_CAPSULE | ORAL | Status: DC

## 2011-09-25 NOTE — Patient Instructions (Signed)
The patient is instructed to continue all medications as prescribed. Schedule followup with check out clerk upon leaving the clinic  

## 2011-09-25 NOTE — Progress Notes (Signed)
Subjective:    Patient ID: Leslie Edwards, female    DOB: 1946/08/03, 66 y.o.   MRN: [id number]  HPI  Weight gain Irritable bowel has been stable with no reported diarrhea or increase in abdominal cramping.  Blood pressure stable on current medications she has had no recent flares of her asthma her GERD has been stable on the medications as well  Review of Systems  Constitutional: Negative for activity change, appetite change and fatigue.  HENT: Negative for ear pain, congestion, neck pain, postnasal drip and sinus pressure.   Eyes: Negative for redness and visual disturbance.  Respiratory: Negative for cough, shortness of breath and wheezing.   Gastrointestinal: Negative for abdominal pain and abdominal distention.  Genitourinary: Negative for dysuria, frequency and menstrual problem.  Musculoskeletal: Negative for myalgias, joint swelling and arthralgias.  Skin: Negative for rash and wound.  Neurological: Negative for dizziness, weakness and headaches.  Hematological: Negative for adenopathy. Does not bruise/bleed easily.  Psychiatric/Behavioral: Negative for sleep disturbance and decreased concentration.   Past Medical History  Diagnosis Date  . Allergy   . Anxiety   . Asthma   . Hypertension   . GERD (gastroesophageal reflux disease)   . Hypothyroidism   . Sprue     History   Social History  . Marital Status: Married    Spouse Name: N/A    Number of Children: N/A  . Years of Education: N/A   Occupational History  . Not on file.   Social History Main Topics  . Smoking status: Former Smoker    Quit date: 01/22/1975  . Smokeless tobacco: Not on file  . Alcohol Use: 1.8 oz/week    3 Glasses of wine per week  . Drug Use: No  . Sexually Active: Not Currently   Other Topics Concern  . Not on file   Social History Narrative  . No narrative on file    Past Surgical History  Procedure Date  . Carpal tunnel release   . Tonsillectomy   . Nissan  fundiplication   . Knee arthroscopy     right  . Lipoma excision 1965    axilary    Family History  Problem Relation Age of Onset  . Cancer Mother     lung  . Macular degeneration Father   . Cancer Maternal Uncle     colon  . Cancer Maternal Aunt     esophageal cancer    Allergies  Allergen Reactions  . Penicillins     REACTION: difficulty breathing  . Prochlorperazine Edisylate     Current Outpatient Prescriptions on File Prior to Visit  Medication Sig Dispense Refill  . albuterol (PROVENTIL HFA;VENTOLIN HFA) 108 (90 BASE) MCG/ACT inhaler Inhale 2 puffs into the lungs every 6 (six) hours as needed.        . ALPRAZolam (XANAX) 0.25 MG tablet Take 0.25 mg by mouth 3 (three) times daily as needed.        Francia Greaves THYROID 90 MG tablet TAKE 1 TABLET ONCE DAILY.  30 each  11  . budesonide-formoterol (SYMBICORT) 160-4.5 MCG/ACT inhaler Inhale 2 puffs into the lungs 2 (two) times daily.        Marland Kitchen buPROPion (WELLBUTRIN XL) 150 MG 24 hr tablet 2 tab in the morning and 1 tab in the evening       . fexofenadine-pseudoephedrine (ALLEGRA-D) 60-120 MG per tablet Take 1 tablet by mouth daily as needed.        . hyoscyamine (LEVSIN SL)  0.125 MG SL tablet Place 0.125 mg under the tongue every 4 (four) hours as needed.        Marland Kitchen ipratropium-albuterol (DUONEB) 0.5-2.5 (3) MG/3ML SOLN Take 3 mLs by nebulization as needed.        . lubiprostone (AMITIZA) 8 MCG capsule Take 8 mcg by mouth daily with breakfast.        . montelukast (SINGULAIR) 10 MG tablet Take 10 mg by mouth at bedtime.        . Naproxen-Esomeprazole 500-20 MG TBEC Take 1 tablet by mouth daily.        Marland Kitchen NASONEX 50 MCG/ACT nasal spray USE 2 SPRAYS IN EACH NOSTRIL ONCE DAILY  17 g  6  . Omeprazole-Sodium Bicarbonate (ZEGERID) 20-1100 MG CAPS Take 1 capsule by mouth at bedtime.        . potassium chloride (K-DUR,KLOR-CON) 10 MEQ tablet Take 10 mEq by mouth daily.        . Pseudoeph-Chlorphen-Hydrocod (ZUTRIPRO) 60-4-5 MG/5ML SOLN Take  by mouth. One tsp by mouth two times a day       . Sulfacetamide-Sulfur-Sunscreen (ROSAC) 10-5 % CREA Apply 1 application topically at bedtime.        Marland Kitchen telmisartan (MICARDIS) 40 MG tablet Take 20 mg by mouth daily.          BP 118/78  Pulse 72  Temp 98.3 F (36.8 C)  Resp 16  Ht 5' 4.25" (1.632 m)  Wt 202 lb (91.627 kg)  BMI 34.40 kg/m2       Objective:   Physical Exam  Nursing note and vitals reviewed. Constitutional: She is oriented to person, place, and time. She appears well-developed and well-nourished. No distress.  HENT:  Head: Normocephalic and atraumatic.  Right Ear: External ear normal.  Left Ear: External ear normal.  Nose: Nose normal.  Mouth/Throat: Oropharynx is clear and moist.  Eyes: Conjunctivae and EOM are normal. Pupils are equal, round, and reactive to light.  Neck: Normal range of motion. Neck supple. No JVD present. No tracheal deviation present. No thyromegaly present.  Cardiovascular: Normal rate, regular rhythm, normal heart sounds and intact distal pulses.   No murmur heard. Pulmonary/Chest: Effort normal and breath sounds normal. She has no wheezes. She exhibits no tenderness.  Abdominal: Soft. Bowel sounds are normal.  Musculoskeletal: Normal range of motion. She exhibits no edema and no tenderness.  Lymphadenopathy:    She has no cervical adenopathy.  Neurological: She is alert and oriented to person, place, and time. She has normal reflexes. No cranial nerve deficit.  Skin: Skin is warm and dry. She is not diaphoretic.       Crusty scab-like lesions on the inside of the nostrils right greater than left  Psychiatric: She has a normal mood and affect. Her behavior is normal.          Assessment & Plan:  Patient symptoms bowel syndrome is stable. Her blood pressure and her GERD are stable she has been attempting to lose weight but her weight remains the same.  We discussed continuing the appetite suppressant and pursuing more vigorous  exercise.  A refill of the phentermine was given.  Asthma medications were reviewed and other medications were discussed that potentially give the patient her sensation of dry mouth and ANA will be obtained as a screen for any of the auto immune related-type syndromes  She is given a prescription for Bactroban ointment for thet erysipelas on the inside of her nostrils

## 2011-09-26 ENCOUNTER — Ambulatory Visit: Payer: Medicare Other | Admitting: Internal Medicine

## 2011-09-26 LAB — ANA: Anti Nuclear Antibody(ANA): NEGATIVE

## 2011-10-16 ENCOUNTER — Other Ambulatory Visit: Payer: Self-pay | Admitting: Internal Medicine

## 2011-10-25 ENCOUNTER — Telehealth: Payer: Self-pay | Admitting: Family Medicine

## 2011-10-25 NOTE — Telephone Encounter (Signed)
Leslie Edwards, I am trying to do a prior auth on this pt's Micardis. She states that it is the only BP med she's ever taken. I need to know why she takes this particular med and why she cannot take something else. Thanks!

## 2011-10-26 MED ORDER — LOSARTAN POTASSIUM 100 MG PO TABS: 100.0000 mg | ORAL_TABLET | Freq: Every day | ORAL | Status: DC

## 2011-10-26 NOTE — Telephone Encounter (Signed)
Notified pt. 

## 2011-10-26 NOTE — Telephone Encounter (Signed)
Per dr Arnoldo Morale- may change to cozaar 100 qd

## 2011-11-20 ENCOUNTER — Other Ambulatory Visit: Payer: Self-pay | Admitting: *Deleted

## 2011-11-20 MED ORDER — LUBIPROSTONE 8 MCG PO CAPS: 8.0000 ug | ORAL_CAPSULE | Freq: Every day | ORAL | Status: DC

## 2011-12-03 ENCOUNTER — Telehealth: Payer: Self-pay | Admitting: Family Medicine

## 2011-12-03 NOTE — Telephone Encounter (Signed)
Patient's Armour Thyroid tablets will not be approved by [facility], because they are not FDA approved. Pt can either pay out of pocket, or switch meds. Please advise. Thanks.

## 2011-12-24 ENCOUNTER — Ambulatory Visit: Payer: Medicare Other | Admitting: Internal Medicine

## 2012-01-12 ENCOUNTER — Other Ambulatory Visit: Payer: Self-pay | Admitting: Internal Medicine

## 2012-02-12 ENCOUNTER — Telehealth: Payer: Self-pay | Admitting: Internal Medicine

## 2012-02-12 MED ORDER — PREDNISONE (PAK) 10 MG PO TABS: 10.0000 mg | ORAL_TABLET | Freq: Every day | ORAL | Status: AC

## 2012-02-12 MED ORDER — BACLOFEN 10 MG PO TABS: 10.0000 mg | ORAL_TABLET | Freq: Three times a day (TID) | ORAL | Status: AC

## 2012-02-12 NOTE — Telephone Encounter (Signed)
Over the last week pt has been experiencing inflamation of siatic nerve.  Pain down both legs and severe lower back pain.  Pt was previously given an rx of coridisone tablets and a musle relaxer.  She has tried everything with no relief.  Requesing to have rx of cordisone and baclofen sent to [facility].

## 2012-02-12 NOTE — Telephone Encounter (Signed)
Pharm has questioning concerning prednisone dosage pak

## 2012-02-12 NOTE — Telephone Encounter (Signed)
Questions answered.

## 2012-02-12 NOTE — Telephone Encounter (Signed)
Per dr Arnoldo Morale- may have prednisone dose pack 10 mg 12 and baclofen 10 tid #30-pt informed

## 2012-02-12 NOTE — Telephone Encounter (Signed)
done

## 2012-02-25 ENCOUNTER — Ambulatory Visit: Payer: Medicare Other | Admitting: Internal Medicine

## 2012-03-11 ENCOUNTER — Ambulatory Visit (INDEPENDENT_AMBULATORY_CARE_PROVIDER_SITE_OTHER): Payer: Medicare Other | Admitting: Internal Medicine

## 2012-03-11 ENCOUNTER — Encounter: Payer: Self-pay | Admitting: Internal Medicine

## 2012-03-11 VITALS — BP 110/70 | HR 76 | Temp 98.6°F | Resp 16 | Ht 64.25 in | Wt 216.0 lb

## 2012-03-11 DIAGNOSIS — R5383 Other fatigue: Secondary | ICD-10-CM

## 2012-03-11 DIAGNOSIS — R5381 Other malaise: Secondary | ICD-10-CM

## 2012-03-11 DIAGNOSIS — G8929 Other chronic pain: Secondary | ICD-10-CM

## 2012-03-11 DIAGNOSIS — M549 Dorsalgia, unspecified: Secondary | ICD-10-CM

## 2012-03-11 DIAGNOSIS — D649 Anemia, unspecified: Secondary | ICD-10-CM

## 2012-03-11 DIAGNOSIS — R609 Edema, unspecified: Secondary | ICD-10-CM

## 2012-03-11 LAB — BASIC METABOLIC PANEL
Calcium: 9.1 mg/dL (ref 8.4–10.5)
GFR: 32.06 mL/min — ABNORMAL LOW (ref >60.00)
Potassium: 4.1 mEq/L (ref 3.5–5.1)
Sodium: 139 mEq/L (ref 135–145)

## 2012-03-11 LAB — CBC WITH DIFFERENTIAL/PLATELET
Basophils Relative: 0.8 % (ref 0.0–3.0)
Eosinophils Relative: 5.7 % — ABNORMAL HIGH (ref 0.0–5.0)
HCT: 34.7 % — ABNORMAL LOW (ref 36.0–46.0)
Hemoglobin: 11.9 g/dL — ABNORMAL LOW (ref 12.0–15.0)
Lymphocytes Relative: 29 % (ref 12.0–46.0)
Monocytes Relative: 10.6 % (ref 3.0–12.0)
Neutro Abs: 2.7 10*3/uL (ref 1.4–7.7)
RBC: 3.48 Mil/uL — ABNORMAL LOW (ref 3.87–5.11)

## 2012-03-11 NOTE — Progress Notes (Signed)
Subjective:    Patient ID: Leslie Edwards, female    DOB: [birth year], 66 y.o.   MRN: [id number]  HPI Follow up for asthma. Symptoms are stable Mild sinus stuffiness and pink tinged mucous Increased swelling in feet Mild increase in back pain    Review of Systems  Constitutional: Negative for activity change, appetite change and fatigue.  HENT: Positive for nosebleeds, congestion, rhinorrhea and postnasal drip. Negative for ear pain, neck pain and sinus pressure.   Eyes: Negative for redness and visual disturbance.  Respiratory: Negative for cough, shortness of breath and wheezing.   Gastrointestinal: Negative for abdominal pain and abdominal distention.  Genitourinary: Negative for dysuria, frequency and menstrual problem.  Musculoskeletal: Negative for myalgias, joint swelling and arthralgias.  Skin: Negative for rash and wound.  Neurological: Negative for dizziness, weakness and headaches.  Hematological: Negative for adenopathy. Does not bruise/bleed easily.  Psychiatric/Behavioral: Negative for disturbed wake/sleep cycle and decreased concentration.   Past Medical History  Diagnosis Date  . Allergy   . Anxiety   . Asthma   . Hypertension   . GERD (gastroesophageal reflux disease)   . Hypothyroidism   . Sprue     History   Social History  . Marital Status: Married    Spouse Name: N/A    Number of Children: N/A  . Years of Education: N/A   Occupational History  . Not on file.   Social History Main Topics  . Smoking status: Former Smoker    Quit date: 01/22/1975  . Smokeless tobacco: Not on file  . Alcohol Use: 1.8 oz/week    3 Glasses of wine per week  . Drug Use: No  . Sexually Active: Not Currently   Other Topics Concern  . Not on file   Social History Narrative  . No narrative on file    Past Surgical History  Procedure Date  . Carpal tunnel release   . Tonsillectomy   . Nissan fundiplication   . Knee arthroscopy     right  . Lipoma excision  1965    axilary    Family History  Problem Relation Age of Onset  . Cancer Mother     lung  . Macular degeneration Father   . Cancer Maternal Uncle     colon  . Cancer Maternal Aunt     esophageal cancer    Allergies  Allergen Reactions  . Penicillins     REACTION: difficulty breathing  . Prochlorperazine Edisylate     Current Outpatient Prescriptions on File Prior to Visit  Medication Sig Dispense Refill  . albuterol (PROVENTIL HFA;VENTOLIN HFA) 108 (90 BASE) MCG/ACT inhaler Inhale 2 puffs into the lungs every 6 (six) hours as needed.        . ALPRAZolam (XANAX) 0.25 MG tablet Take 0.25 mg by mouth 3 (three) times daily as needed.        Francia Greaves THYROID 90 MG tablet TAKE 1 TABLET ONCE DAILY.  30 each  11  . baclofen (LIORESAL) 10 MG tablet Take 1 tablet (10 mg total) by mouth 3 (three) times daily.  30 each  0  . budesonide-formoterol (SYMBICORT) 160-4.5 MCG/ACT inhaler Inhale 2 puffs into the lungs 2 (two) times daily.        Marland Kitchen DEMADEX 100 MG tablet TAKE 1 TABLET ONCE DAILY.  30 each  6  . fexofenadine-pseudoephedrine (ALLEGRA-D) 60-120 MG per tablet Take 1 tablet by mouth daily as needed.        . hyoscyamine (  LEVSIN SL) 0.125 MG SL tablet Place 0.125 mg under the tongue every 4 (four) hours as needed.        Marland Kitchen ipratropium-albuterol (DUONEB) 0.5-2.5 (3) MG/3ML SOLN Take 3 mLs by nebulization as needed.        Marland Kitchen losartan (COZAAR) 100 MG tablet Take 1 tablet (100 mg total) by mouth daily.  90 tablet  3  . lubiprostone (AMITIZA) 8 MCG capsule Take 1 capsule (8 mcg total) by mouth daily with breakfast.  30 capsule  11  . MICARDIS 40 MG tablet TAKE 1 TABLET ONCE DAILY.  30 each  6  . montelukast (SINGULAIR) 10 MG tablet Take 10 mg by mouth at bedtime.        . Naproxen-Esomeprazole 500-20 MG TBEC Take 1 tablet by mouth daily.        Marland Kitchen NASONEX 50 MCG/ACT nasal spray USE 2 SPRAYS IN EACH NOSTRIL ONCE DAILY  17 g  6  . Omeprazole-Sodium Bicarbonate (ZEGERID) 20-1100 MG CAPS Take 1  capsule by mouth at bedtime.        . potassium chloride (K-DUR,KLOR-CON) 10 MEQ tablet Take 10 mEq by mouth daily.        . Pseudoeph-Chlorphen-Hydrocod (ZUTRIPRO) 60-4-5 MG/5ML SOLN Take by mouth. One tsp by mouth two times a day       . Sulfacetamide-Sulfur-Sunscreen (ROSAC) 10-5 % CREA Apply 1 application topically at bedtime.        . WELLBUTRIN XL 150 MG 24 hr tablet TAKE 2 TABLETS IN THE MORNING AND 1 TABLET IN THE EVENING.  90 each  6    BP 110/70  Pulse 76  Temp 98.6 F (37 C)  Resp 16  Ht 5' 4.25" (1.632 m)  Wt 216 lb (97.977 kg)  BMI 36.79 kg/m2        Objective:   Physical Exam  Nursing note and vitals reviewed. Constitutional: She is oriented to person, place, and time. She appears well-developed and well-nourished. No distress.  HENT:  Head: Normocephalic and atraumatic.  Right Ear: External ear normal.  Left Ear: External ear normal.  Nose: Nose normal.  Mouth/Throat: Oropharynx is clear and moist.  Eyes: Conjunctivae and EOM are normal. Pupils are equal, round, and reactive to light.       Swollen red turbinates  Neck: Normal range of motion. Neck supple. No JVD present. No tracheal deviation present. No thyromegaly present.  Cardiovascular: Normal rate, regular rhythm, normal heart sounds and intact distal pulses.   No murmur heard. Pulmonary/Chest: Effort normal and breath sounds normal. She has no wheezes. She exhibits no tenderness.  Abdominal: Soft. Bowel sounds are normal.  Musculoskeletal: Normal range of motion. She exhibits no edema and no tenderness.  Lymphadenopathy:    She has no cervical adenopathy.  Neurological: She is alert and oriented to person, place, and time. She has normal reflexes. No cranial nerve deficit.  Skin: Skin is warm and dry. She is not diaphoretic.  Psychiatric: She has a normal mood and affect. Her behavior is normal.          Assessment & Plan:  Sinus pressure, recommend bedroom humidifier Chronic back pain, failed  a course of medrol and muscle relaxer Reviewed diet principals including gluten-free diet. Discussed hypothyroidism stable on current medications discussed morbid obesity and reviewed diet principals for treatment of morbid obesity

## 2012-03-11 NOTE — Patient Instructions (Addendum)
Cold steam humidifier or ultrasonic humidifier if the best type for the bedroom  Go to [place] main office across from the hospital and go to the basement and get your back x-ray anytime this week between 8 AM and 5 PM

## 2012-03-13 ENCOUNTER — Ambulatory Visit (INDEPENDENT_AMBULATORY_CARE_PROVIDER_SITE_OTHER)
Admission: RE | Admit: 2012-03-13 | Discharge: 2012-03-13 | Disposition: A | Discharge status: Home / Self Care | Payer: Medicare Other | Source: Ambulatory Visit | Attending: Internal Medicine | Admitting: Internal Medicine

## 2012-03-13 DIAGNOSIS — G8929 Other chronic pain: Secondary | ICD-10-CM

## 2012-03-13 DIAGNOSIS — M549 Dorsalgia, unspecified: Secondary | ICD-10-CM

## 2012-03-14 ENCOUNTER — Other Ambulatory Visit: Payer: Self-pay | Admitting: Internal Medicine

## 2012-03-25 ENCOUNTER — Telehealth: Payer: Self-pay | Admitting: Internal Medicine

## 2012-03-25 NOTE — Telephone Encounter (Signed)
Pt would like blood work results °

## 2012-03-26 ENCOUNTER — Other Ambulatory Visit: Payer: Self-pay | Admitting: *Deleted

## 2012-03-26 DIAGNOSIS — M5136 Other intervertebral disc degeneration, lumbar region: Secondary | ICD-10-CM

## 2012-03-26 NOTE — Telephone Encounter (Signed)
Per dr Arnoldo Morale- stop vimova- will help with elevated bun and low hgb- pt informed and per dr Arnoldo Morale to dr Nelva Bush for possible back injections

## 2012-04-14 ENCOUNTER — Other Ambulatory Visit: Payer: Self-pay | Admitting: Internal Medicine

## 2012-05-14 ENCOUNTER — Other Ambulatory Visit: Payer: Self-pay | Admitting: *Deleted

## 2012-05-14 MED ORDER — TORSEMIDE 100 MG PO TABS: 100.0000 mg | ORAL_TABLET | Freq: Every day | ORAL | Status: DC

## 2012-05-24 ENCOUNTER — Other Ambulatory Visit: Payer: Self-pay | Admitting: Family Medicine

## 2012-06-10 ENCOUNTER — Ambulatory Visit: Payer: Medicare Other | Admitting: Internal Medicine

## 2012-06-24 ENCOUNTER — Ambulatory Visit (INDEPENDENT_AMBULATORY_CARE_PROVIDER_SITE_OTHER): Payer: Medicare Other | Admitting: Internal Medicine

## 2012-06-24 ENCOUNTER — Encounter: Payer: Self-pay | Admitting: Internal Medicine

## 2012-06-24 VITALS — BP 136/80 | HR 76 | Temp 98.2°F | Resp 16 | Ht 64.25 in | Wt 198.0 lb

## 2012-06-24 DIAGNOSIS — Z23 Encounter for immunization: Secondary | ICD-10-CM

## 2012-06-24 DIAGNOSIS — R059 Cough, unspecified: Secondary | ICD-10-CM

## 2012-06-24 DIAGNOSIS — K589 Irritable bowel syndrome without diarrhea: Secondary | ICD-10-CM

## 2012-06-24 DIAGNOSIS — Z0000 Encounter for general adult medical examination without abnormal findings: Secondary | ICD-10-CM

## 2012-06-24 DIAGNOSIS — N289 Disorder of kidney and ureter, unspecified: Secondary | ICD-10-CM

## 2012-06-24 DIAGNOSIS — R05 Cough: Secondary | ICD-10-CM

## 2012-06-24 LAB — BASIC METABOLIC PANEL
BUN: 31 mg/dL — ABNORMAL HIGH (ref 6–23)
CO2: 28 mEq/L (ref 19–32)
Calcium: 9.7 mg/dL (ref 8.4–10.5)
Creatinine, Ser: 1.6 mg/dL — ABNORMAL HIGH (ref 0.4–1.2)
Glucose, Bld: 97 mg/dL (ref 70–99)

## 2012-06-24 MED ORDER — PSEUDOEPH-CHLORPHEN-HYDROCOD 60-4-5 MG/5ML PO SOLN: 5.0000 mL | Freq: Two times a day (BID) | ORAL | Status: DC

## 2012-06-24 NOTE — Progress Notes (Signed)
Subjective:    Patient ID: Leslie Edwards, female    DOB: [birth year], 66 y.o.   MRN: [id number]  HPI Increased cough and GERD Back spasms Referred to PT be has not started Discussed stretches and taught stretches   Review of Systems  Constitutional: Negative for activity change, appetite change and fatigue.  HENT: Negative for ear pain, congestion, neck pain, postnasal drip and sinus pressure.   Eyes: Negative for redness and visual disturbance.  Respiratory: Negative for cough, shortness of breath and wheezing.   Gastrointestinal: Negative for abdominal pain and abdominal distention.  Genitourinary: Negative for dysuria, frequency and menstrual problem.  Musculoskeletal: Positive for myalgias, back pain, joint swelling and gait problem. Negative for arthralgias.  Skin: Negative for rash and wound.  Neurological: Negative for dizziness, weakness and headaches.  Hematological: Negative for adenopathy. Does not bruise/bleed easily.  Psychiatric/Behavioral: Negative for disturbed wake/sleep cycle and decreased concentration.   Past Medical History  Diagnosis Date  . Allergy   . Anxiety   . Asthma   . Hypertension   . GERD (gastroesophageal reflux disease)   . Hypothyroidism   . Sprue     History   Social History  . Marital Status: Married    Spouse Name: N/A    Number of Children: N/A  . Years of Education: N/A   Occupational History  . Not on file.   Social History Main Topics  . Smoking status: Former Smoker    Quit date: 01/22/1975  . Smokeless tobacco: Not on file  . Alcohol Use: 1.8 oz/week    3 Glasses of wine per week  . Drug Use: No  . Sexually Active: Not Currently   Other Topics Concern  . Not on file   Social History Narrative  . No narrative on file    Past Surgical History  Procedure Date  . Carpal tunnel release   . Tonsillectomy   . Nissan fundiplication   . Knee arthroscopy     right  . Lipoma excision 1965    axilary    Family  History  Problem Relation Age of Onset  . Cancer Mother     lung  . Macular degeneration Father   . Cancer Maternal Uncle     colon  . Cancer Maternal Aunt     esophageal cancer    Allergies  Allergen Reactions  . Penicillins     REACTION: difficulty breathing  . Prochlorperazine Edisylate     Current Outpatient Prescriptions on File Prior to Visit  Medication Sig Dispense Refill  . ADIPEX-P 37.5 MG tablet TAKE 1 TABLET IN THE MORNING.  30 each  3  . albuterol (PROVENTIL HFA;VENTOLIN HFA) 108 (90 BASE) MCG/ACT inhaler Inhale 2 puffs into the lungs every 6 (six) hours as needed.        . ALPRAZolam (XANAX) 0.25 MG tablet Take 0.25 mg by mouth 3 (three) times daily as needed.        . AMITIZA 8 MCG capsule TAKE (1) CAPSULE DAILY.  30 each  11  . ARMOUR THYROID 90 MG tablet TAKE 1 TABLET ONCE DAILY.  30 each  11  . budesonide-formoterol (SYMBICORT) 160-4.5 MCG/ACT inhaler Inhale 2 puffs into the lungs 2 (two) times daily.        . fexofenadine-pseudoephedrine (ALLEGRA-D) 60-120 MG per tablet Take 1 tablet by mouth daily as needed.        . hyoscyamine (LEVSIN SL) 0.125 MG SL tablet Place 0.125 mg under the tongue every  4 (four) hours as needed.        Marland Kitchen ipratropium-albuterol (DUONEB) 0.5-2.5 (3) MG/3ML SOLN Take 3 mLs by nebulization as needed.        Marland Kitchen K-DUR 10 MEQ tablet TAKE 1 TABLET DAILY.  30 each  11  . losartan (COZAAR) 100 MG tablet Take 1 tablet (100 mg total) by mouth daily.  90 tablet  3  . montelukast (SINGULAIR) 10 MG tablet Take 10 mg by mouth at bedtime.        Marland Kitchen NASONEX 50 MCG/ACT nasal spray USE 2 SPRAYS IN EACH NOSTRIL ONCE DAILY  17 g  6  . Omeprazole-Sodium Bicarbonate (ZEGERID) 20-1100 MG CAPS Take 1 capsule by mouth at bedtime.        . Sulfacetamide-Sulfur-Sunscreen (ROSAC) 10-5 % CREA Apply 1 application topically at bedtime.        . torsemide (DEMADEX) 100 MG tablet Take 1 tablet (100 mg total) by mouth daily.  30 tablet  11  . WELLBUTRIN XL 150 MG 24 hr  tablet TAKE 2 TABLETS IN THE MORNING AND 1 TABLET IN THE EVENING.  90 each  6  . DISCONTD: phentermine 37.5 MG capsule Take 1 capsule (37.5 mg total) by mouth daily.  30 capsule  3  . DISCONTD: phentermine 37.5 MG capsule Take 1 capsule (37.5 mg total) by mouth every morning.  30 capsule  3    BP 136/80  Pulse 76  Temp 98.2 F (36.8 C)  Resp 16  Ht 5' 4.25" (1.632 m)  Wt 198 lb (89.812 kg)  BMI 33.72 kg/m2       Objective:   Physical Exam  Nursing note and vitals reviewed. Constitutional: She is oriented to person, place, and time. She appears well-developed and well-nourished. No distress.  HENT:  Head: Normocephalic and atraumatic.  Right Ear: External ear normal.  Left Ear: External ear normal.  Nose: Nose normal.  Mouth/Throat: Oropharynx is clear and moist.  Eyes: Conjunctivae normal and EOM are normal. Pupils are equal, round, and reactive to light.  Neck: Normal range of motion. Neck supple. No JVD present. No tracheal deviation present. No thyromegaly present.  Cardiovascular: Normal rate, regular rhythm, normal heart sounds and intact distal pulses.   No murmur heard. Pulmonary/Chest: Effort normal and breath sounds normal. She has no wheezes. She exhibits no tenderness.  Abdominal: Soft. Bowel sounds are normal.  Musculoskeletal: Normal range of motion. She exhibits no edema and no tenderness.  Lymphadenopathy:    She has no cervical adenopathy.  Neurological: She is alert and oriented to person, place, and time. She has normal reflexes. No cranial nerve deficit.  Skin: Skin is warm and dry. She is not diaphoretic.  Psychiatric: She has a normal mood and affect. Her behavior is normal.          Assessment & Plan:  Chronic cough IBS Gluten free diet paleo system GERD flair

## 2012-06-24 NOTE — Patient Instructions (Addendum)
The patient is instructed to continue all medications as prescribed. Schedule followup with check out clerk upon leaving the clinic  

## 2012-06-26 ENCOUNTER — Telehealth: Payer: Self-pay | Admitting: Internal Medicine

## 2012-06-26 ENCOUNTER — Ambulatory Visit: Payer: Medicare Other | Attending: Physical Medicine and Rehabilitation

## 2012-06-26 DIAGNOSIS — R5381 Other malaise: Secondary | ICD-10-CM | POA: Insufficient documentation

## 2012-06-26 DIAGNOSIS — M545 Low back pain, unspecified: Secondary | ICD-10-CM | POA: Insufficient documentation

## 2012-06-26 DIAGNOSIS — IMO0001 Reserved for inherently not codable concepts without codable children: Secondary | ICD-10-CM | POA: Insufficient documentation

## 2012-06-26 NOTE — Telephone Encounter (Signed)
Caller: Mike/Patient; Patient Name: Leslie Edwards; PCP: Benay Pillow (Adults only); Best Callback Phone Number: [phone]; Reason for call: Script for Pseudoeph-Chlorphen-Hydrocod (ZUTRIPRO) 60-4-5 MG/5ML SOLN is not covered by Medicare and would cost the pt $400.  Pharmacist recommending Hycodan as a subsitute and the pt can get the other medication components OTC.  No triage.   PLEASE FOLLOW UP THE PT/ PHARMACIST REGARDING SCRIPT.  Thank you.

## 2012-06-26 NOTE — Telephone Encounter (Signed)
Ok per dr Arnoldo Morale- pharmacy called

## 2012-07-03 ENCOUNTER — Other Ambulatory Visit: Payer: Self-pay | Admitting: *Deleted

## 2012-07-03 ENCOUNTER — Ambulatory Visit: Payer: Medicare Other

## 2012-07-03 ENCOUNTER — Encounter: Payer: Self-pay | Admitting: Internal Medicine

## 2012-07-03 NOTE — Telephone Encounter (Signed)
She takes torosemide 100 daily--fyi

## 2012-07-03 NOTE — Telephone Encounter (Signed)
torosemide 100 qd is what she takes

## 2012-07-07 ENCOUNTER — Encounter: Payer: Self-pay | Admitting: Internal Medicine

## 2012-07-07 ENCOUNTER — Ambulatory Visit: Payer: Medicare Other | Admitting: Physical Therapy

## 2012-07-14 ENCOUNTER — Ambulatory Visit: Payer: Medicare Other

## 2012-07-21 ENCOUNTER — Ambulatory Visit: Payer: Medicare Other | Attending: Physical Medicine and Rehabilitation

## 2012-07-21 DIAGNOSIS — M545 Low back pain, unspecified: Secondary | ICD-10-CM | POA: Insufficient documentation

## 2012-07-21 DIAGNOSIS — R5381 Other malaise: Secondary | ICD-10-CM | POA: Insufficient documentation

## 2012-07-21 DIAGNOSIS — M2569 Stiffness of other specified joint, not elsewhere classified: Secondary | ICD-10-CM | POA: Insufficient documentation

## 2012-07-21 DIAGNOSIS — IMO0001 Reserved for inherently not codable concepts without codable children: Secondary | ICD-10-CM | POA: Insufficient documentation

## 2012-08-12 ENCOUNTER — Other Ambulatory Visit: Payer: Self-pay | Admitting: Internal Medicine

## 2012-08-13 ENCOUNTER — Encounter: Payer: Self-pay | Admitting: Family

## 2012-08-13 ENCOUNTER — Ambulatory Visit (INDEPENDENT_AMBULATORY_CARE_PROVIDER_SITE_OTHER): Payer: Medicare Other | Admitting: Family

## 2012-08-13 ENCOUNTER — Telehealth: Payer: Self-pay | Admitting: Internal Medicine

## 2012-08-13 VITALS — BP 124/82 | HR 107 | Temp 98.9°F | Wt 198.0 lb

## 2012-08-13 DIAGNOSIS — J029 Acute pharyngitis, unspecified: Secondary | ICD-10-CM

## 2012-08-13 DIAGNOSIS — J040 Acute laryngitis: Secondary | ICD-10-CM

## 2012-08-13 DIAGNOSIS — J329 Chronic sinusitis, unspecified: Secondary | ICD-10-CM

## 2012-08-13 MED ORDER — AZITHROMYCIN 250 MG PO TABS: ORAL_TABLET | ORAL | Status: DC

## 2012-08-13 MED ORDER — METHYLPREDNISOLONE ACETATE 80 MG/ML IJ SUSP
80.0000 mg | Freq: Once | INTRAMUSCULAR | Status: AC
  Administered 2012-08-13: 80 mg via INTRAMUSCULAR

## 2012-08-13 NOTE — Telephone Encounter (Signed)
Patient Information:  Caller Name: Sella  Phone: [phone]  Patient: Leslie Edwards  Gender: Female  DOB: [birth year]  Age: 66 Years  PCP: Benay Pillow (Adults only)   Symptoms  Reason For Call & Symptoms: sinus pain, pressure, cough  Reviewed Health History In EMR: Yes  Reviewed Medications In EMR: Yes  Reviewed Allergies In EMR: Yes  Reviewed Surgeries / Procedures: Yes  Date of Onset of Symptoms: 08/08/2012  Guideline(s) Used:  Sinus Pain and Congestion  Disposition Per Guideline:   See Today or Tomorrow in Office  Reason For Disposition Reached:   Using nasal washes and pain medicine > 24 hours and sinus pain (lower forehead, cheekbone, or eye) persists  Advice Given:  N/A  Office Follow Up:  Does the office need to follow up with this patient?: Yes  Instructions For The Office: Rx requested due to work  Patient Refused Recommendation:  Patient Requests Prescription  Rx requested due to work  RN Note:  Has been using humidifier, nasal washes, without relief.  Afebrile.  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  Would like Rx called in to [place]. May reach patient [phone].

## 2012-08-13 NOTE — Progress Notes (Signed)
Subjective:    Patient ID: Leslie Edwards, female    DOB: 09/15/1946, 66 y.o.   MRN: [id number]  HPI 66 year old white female, nonsmoker, patient of Dr. Arnoldo Morale is in today with complaints of sore throat, cough, congestion, sinus pressure, worsening over the last 5 days. She's been taken Alka-Seltzer plus, Azle saline, and a vaporizer with no relief.   Review of Systems  Constitutional: Positive for fatigue.  HENT: Positive for nosebleeds, congestion, sore throat, rhinorrhea, sneezing, postnasal drip and sinus pressure.   Respiratory: Positive for cough.   Cardiovascular: Negative.   Gastrointestinal: Negative.   Musculoskeletal: Negative.   Skin: Negative.   Neurological: Negative.   Hematological: Negative.   Psychiatric/Behavioral: Negative.    Past Medical History  Diagnosis Date  . Allergy   . Anxiety   . Asthma   . Hypertension   . GERD (gastroesophageal reflux disease)   . Hypothyroidism   . Sprue     History   Social History  . Marital Status: Married    Spouse Name: N/A    Number of Children: N/A  . Years of Education: N/A   Occupational History  . Not on file.   Social History Main Topics  . Smoking status: Former Smoker    Quit date: 01/22/1975  . Smokeless tobacco: Not on file  . Alcohol Use: 1.8 oz/week    3 Glasses of wine per week  . Drug Use: No  . Sexually Active: Not Currently   Other Topics Concern  . Not on file   Social History Narrative  . No narrative on file    Past Surgical History  Procedure Date  . Carpal tunnel release   . Tonsillectomy   . Nissan fundiplication   . Knee arthroscopy     right  . Lipoma excision 1965    axilary    Family History  Problem Relation Age of Onset  . Cancer Mother     lung  . Macular degeneration Father   . Cancer Maternal Uncle     colon  . Cancer Maternal Aunt     esophageal cancer    Allergies  Allergen Reactions  . Penicillins     REACTION: difficulty breathing  .  Prochlorperazine Edisylate     Current Outpatient Prescriptions on File Prior to Visit  Medication Sig Dispense Refill  . ADIPEX-P 37.5 MG tablet TAKE 1 TABLET IN THE MORNING.  30 each  3  . albuterol (PROVENTIL HFA;VENTOLIN HFA) 108 (90 BASE) MCG/ACT inhaler Inhale 2 puffs into the lungs every 6 (six) hours as needed.        . ALPRAZolam (XANAX) 0.25 MG tablet Take 0.25 mg by mouth 3 (three) times daily as needed.        . AMITIZA 8 MCG capsule TAKE (1) CAPSULE DAILY.  30 each  11  . ARMOUR THYROID 90 MG tablet TAKE 1 TABLET ONCE DAILY.  30 each  11  . budesonide-formoterol (SYMBICORT) 160-4.5 MCG/ACT inhaler Inhale 2 puffs into the lungs 2 (two) times daily.        Marland Kitchen buPROPion (WELLBUTRIN XL) 150 MG 24 hr tablet TAKE 2 TABLETS IN THE MORNING AND 1 TABLET IN THE EVENING.  90 tablet  1  . fexofenadine-pseudoephedrine (ALLEGRA-D) 60-120 MG per tablet Take 1 tablet by mouth daily as needed.        . hyoscyamine (LEVSIN SL) 0.125 MG SL tablet Place 0.125 mg under the tongue every 4 (four) hours as needed.        Marland Kitchen  ipratropium-albuterol (DUONEB) 0.5-2.5 (3) MG/3ML SOLN Take 3 mLs by nebulization as needed.        Marland Kitchen K-DUR 10 MEQ tablet TAKE 1 TABLET DAILY.  30 each  11  . losartan (COZAAR) 100 MG tablet Take 1 tablet (100 mg total) by mouth daily.  90 tablet  3  . montelukast (SINGULAIR) 10 MG tablet Take 10 mg by mouth at bedtime.        Marland Kitchen NASONEX 50 MCG/ACT nasal spray USE 2 SPRAYS IN EACH NOSTRIL ONCE DAILY  17 g  6  . Omeprazole-Sodium Bicarbonate (ZEGERID) 20-1100 MG CAPS Take 1 capsule by mouth at bedtime.        . Pseudoeph-Chlorphen-Hydrocod (ZUTRIPRO) 60-4-5 MG/5ML SOLN Take 5 mLs by mouth 2 (two) times daily. One tsp by mouth two times a day  480 mL  1  . Sulfacetamide-Sulfur-Sunscreen (ROSAC) 10-5 % CREA Apply 1 application topically at bedtime.        . torsemide (DEMADEX) 100 MG tablet Take 1 tablet (100 mg total) by mouth daily.  30 tablet  11    BP 124/82  Pulse 107  Temp 98.9 F  (37.2 C) (Oral)  Wt 198 lb (89.812 kg)  SpO2 99%chart    Objective:   Physical Exam  Constitutional: She is oriented to person, place, and time. She appears well-developed and well-nourished.  HENT:  Right Ear: External ear normal.  Left Ear: External ear normal.       sinus tenderness to palpation of the ethmoid and maxillary sinuses bilaterally.  Neck: Normal range of motion. Neck supple.  Cardiovascular: Normal rate, regular rhythm and normal heart sounds.   Pulmonary/Chest: Effort normal and breath sounds normal.  Neurological: She is alert and oriented to person, place, and time.  Skin: Skin is warm and dry.          Assessment & Plan:  Assessment: Sinusitis, laryngitis, pharyngitis  Plan: Z-Pak as directed. Depo-Medrol 80 mg IM x1. Patient call the office if symptoms worsen or persist. Over-the-counter symptomatic treatment for relief. Recheck as scheduled, and when necessary.

## 2012-08-13 NOTE — Patient Instructions (Signed)

## 2012-08-13 NOTE — Telephone Encounter (Signed)
Ov with padonda at 3"15

## 2012-09-25 ENCOUNTER — Ambulatory Visit: Payer: Medicare Other | Admitting: Internal Medicine

## 2012-10-02 ENCOUNTER — Other Ambulatory Visit: Payer: Self-pay | Admitting: Internal Medicine

## 2012-10-13 ENCOUNTER — Other Ambulatory Visit: Payer: Self-pay | Admitting: Internal Medicine

## 2012-10-28 ENCOUNTER — Other Ambulatory Visit: Payer: Self-pay | Admitting: *Deleted

## 2012-10-28 MED ORDER — LOSARTAN POTASSIUM 100 MG PO TABS: 100.0000 mg | ORAL_TABLET | Freq: Every day | ORAL | Status: DC

## 2012-12-12 ENCOUNTER — Encounter: Payer: Self-pay | Admitting: Internal Medicine

## 2012-12-12 ENCOUNTER — Ambulatory Visit (INDEPENDENT_AMBULATORY_CARE_PROVIDER_SITE_OTHER): Payer: Medicare Other | Admitting: Internal Medicine

## 2012-12-12 ENCOUNTER — Other Ambulatory Visit: Payer: Self-pay | Admitting: *Deleted

## 2012-12-12 VITALS — BP 136/84 | HR 80 | Temp 98.3°F | Resp 16 | Ht 64.25 in | Wt 202.0 lb

## 2012-12-12 DIAGNOSIS — E669 Obesity, unspecified: Secondary | ICD-10-CM

## 2012-12-12 DIAGNOSIS — R059 Cough, unspecified: Secondary | ICD-10-CM

## 2012-12-12 DIAGNOSIS — I10 Essential (primary) hypertension: Secondary | ICD-10-CM

## 2012-12-12 DIAGNOSIS — R05 Cough: Secondary | ICD-10-CM

## 2012-12-12 DIAGNOSIS — E039 Hypothyroidism, unspecified: Secondary | ICD-10-CM

## 2012-12-12 DIAGNOSIS — L719 Rosacea, unspecified: Secondary | ICD-10-CM

## 2012-12-12 DIAGNOSIS — R21 Rash and other nonspecific skin eruption: Secondary | ICD-10-CM

## 2012-12-12 LAB — SEDIMENTATION RATE: Sed Rate: 21 mm/hr (ref 0–22)

## 2012-12-12 LAB — BASIC METABOLIC PANEL
GFR: 39.76 mL/min — ABNORMAL LOW (ref >60.00)
Potassium: 4.3 mEq/L (ref 3.5–5.1)
Sodium: 136 mEq/L (ref 135–145)

## 2012-12-12 MED ORDER — TRETINOIN 0.025 % EX CREA: TOPICAL_CREAM | Freq: Every day | CUTANEOUS | Status: DC

## 2012-12-12 MED ORDER — CLOTRIMAZOLE-BETAMETHASONE 1-0.05 % EX CREA: TOPICAL_CREAM | Freq: Two times a day (BID) | CUTANEOUS | Status: DC

## 2012-12-12 MED ORDER — PHENTERMINE HCL 37.5 MG PO TABS: ORAL_TABLET | ORAL | Status: DC

## 2012-12-12 MED ORDER — BENZONATATE 200 MG PO CAPS: 200.0000 mg | ORAL_CAPSULE | Freq: Two times a day (BID) | ORAL | Status: DC | PRN

## 2012-12-12 NOTE — Progress Notes (Signed)
  Subjective:    Patient ID: Leslie Edwards, female    DOB: [birth year], 67 y.o.   MRN: [id number]  HPI Weight management Asthma control: increased episodes of flairs and  Wheezing Flairs occuring weekly Using nebulizer and cough syrup  resh on neck Retin a for rosacia      Review of Systems  Constitutional: Negative for activity change, appetite change and fatigue.  HENT: Negative for ear pain, congestion, neck pain, postnasal drip and sinus pressure.   Eyes: Negative for redness and visual disturbance.  Respiratory: Negative for cough, shortness of breath and wheezing.   Gastrointestinal: Negative for abdominal pain and abdominal distention.  Genitourinary: Negative for dysuria, frequency and menstrual problem.  Musculoskeletal: Negative for myalgias, joint swelling and arthralgias.  Skin: Negative for rash and wound.  Neurological: Negative for dizziness, weakness and headaches.  Hematological: Negative for adenopathy. Does not bruise/bleed easily.  Psychiatric/Behavioral: Negative for sleep disturbance and decreased concentration.       Objective:   Physical Exam  Nursing note and vitals reviewed. Constitutional: She is oriented to person, place, and time. She appears well-developed and well-nourished. No distress.  obese  HENT:  Head: Normocephalic and atraumatic.  Right Ear: External ear normal.  Left Ear: External ear normal.  Nose: Nose normal.  Mouth/Throat: Oropharynx is clear and moist.  Eyes: Conjunctivae and EOM are normal. Pupils are equal, round, and reactive to light.  Neck: Normal range of motion. Neck supple. No JVD present. No tracheal deviation present. No thyromegaly present.  Cardiovascular: Normal rate, regular rhythm and intact distal pulses.   Murmur heard. Pulmonary/Chest: Effort normal and breath sounds normal. She has no wheezes. She exhibits no tenderness.  Abdominal: Soft. Bowel sounds are normal.  Musculoskeletal: Normal range of motion.  She exhibits no edema and no tenderness.  Lymphadenopathy:    She has no cervical adenopathy.  Neurological: She is alert and oriented to person, place, and time. She has normal reflexes. No cranial nerve deficit.  Skin: Skin is warm and dry. She is not diaphoretic.  Psychiatric: She has a normal mood and affect. Her behavior is normal.          Assessment & Plan:  Tessilon perles fpor cough Rash  Rosacea and use of retin a discussed Elevated creatinine most probably due to overdiuresis she has been reducing her diuretic by 50% and will monitor basic metabolic today.  Diffuse rash on chest unknown etiology but possibly a fungal relationship will try Lotrisone cream as a diagnostic test  Possible drug eruption

## 2012-12-12 NOTE — Patient Instructions (Addendum)
The patient is instructed to continue all medications as prescribed. Schedule followup with check out clerk upon leaving the clinic  

## 2013-02-10 ENCOUNTER — Other Ambulatory Visit: Payer: Self-pay | Admitting: Internal Medicine

## 2013-03-16 ENCOUNTER — Other Ambulatory Visit: Payer: Self-pay | Admitting: Internal Medicine

## 2013-04-13 ENCOUNTER — Encounter: Payer: Self-pay | Admitting: Internal Medicine

## 2013-04-13 ENCOUNTER — Ambulatory Visit (INDEPENDENT_AMBULATORY_CARE_PROVIDER_SITE_OTHER): Payer: Medicare Other | Admitting: Internal Medicine

## 2013-04-13 VITALS — BP 130/80 | HR 72 | Temp 98.2°F | Resp 16 | Ht 64.25 in | Wt 203.0 lb

## 2013-04-13 DIAGNOSIS — K900 Celiac disease: Secondary | ICD-10-CM

## 2013-04-13 DIAGNOSIS — R319 Hematuria, unspecified: Secondary | ICD-10-CM

## 2013-04-13 DIAGNOSIS — E039 Hypothyroidism, unspecified: Secondary | ICD-10-CM

## 2013-04-13 DIAGNOSIS — I10 Essential (primary) hypertension: Secondary | ICD-10-CM

## 2013-04-13 DIAGNOSIS — N390 Urinary tract infection, site not specified: Secondary | ICD-10-CM

## 2013-04-13 LAB — POCT URINALYSIS DIPSTICK
Leukocytes, UA: NEGATIVE
Urobilinogen, UA: 0.2

## 2013-04-13 LAB — BASIC METABOLIC PANEL
Calcium: 9.7 mg/dL (ref 8.4–10.5)
GFR: 20.72 mL/min — ABNORMAL LOW (ref >60.00)
Sodium: 137 mEq/L (ref 135–145)

## 2013-04-13 LAB — T3, FREE: T3, Free: 1.9 pg/mL — ABNORMAL LOW (ref 2.3–4.2)

## 2013-04-13 NOTE — Progress Notes (Signed)
Subjective:    Patient ID: Leslie Edwards, female    DOB: 1946/05/23, 67 y.o.   MRN: [id number]  HPI  Flu like illness with a diffuse rash lasting about one month in duration. Myalgias with a sense of elevated temperature without measured temperature. Stiffness in joints.   Review of Systems  Constitutional: Negative for activity change, appetite change and fatigue.  HENT: Positive for rhinorrhea. Negative for ear pain, neck pain, postnasal drip and sinus pressure.   Eyes: Negative for redness and visual disturbance.  Respiratory: Positive for cough. Negative for shortness of breath and wheezing.   Cardiovascular: Positive for leg swelling.  Gastrointestinal: Negative for abdominal pain and abdominal distention.  Genitourinary: Negative for dysuria, frequency and menstrual problem.  Musculoskeletal: Positive for myalgias and joint swelling. Negative for arthralgias.       Stiffness  Skin: Negative for rash and wound.  Neurological: Negative for dizziness, weakness and headaches.  Hematological: Negative for adenopathy. Does not bruise/bleed easily.  Psychiatric/Behavioral: Negative for sleep disturbance and decreased concentration.   Past Medical History  Diagnosis Date  . Allergy   . Anxiety   . Asthma   . Hypertension   . GERD (gastroesophageal reflux disease)   . Hypothyroidism   . Sprue     History   Social History  . Marital Status: Married    Spouse Name: N/A    Number of Children: N/A  . Years of Education: N/A   Occupational History  . Not on file.   Social History Main Topics  . Smoking status: Former Smoker    Quit date: 01/22/1975  . Smokeless tobacco: Not on file  . Alcohol Use: 1.8 oz/week    3 Glasses of wine per week  . Drug Use: No  . Sexually Active: Not Currently   Other Topics Concern  . Not on file   Social History Narrative  . No narrative on file    Past Surgical History  Procedure Laterality Date  . Carpal tunnel release    .  Tonsillectomy    . Nissan fundiplication    . Knee arthroscopy      right  . Lipoma excision  1965    axilary    Family History  Problem Relation Age of Onset  . Cancer Mother     lung  . Macular degeneration Father   . Cancer Maternal Uncle     colon  . Cancer Maternal Aunt     esophageal cancer    Allergies  Allergen Reactions  . Penicillins     REACTION: difficulty breathing  . Prochlorperazine Edisylate     Current Outpatient Prescriptions on File Prior to Visit  Medication Sig Dispense Refill  . albuterol (PROVENTIL HFA;VENTOLIN HFA) 108 (90 BASE) MCG/ACT inhaler Inhale 2 puffs into the lungs every 6 (six) hours as needed.        . ALPRAZolam (XANAX) 0.25 MG tablet Take 0.25 mg by mouth 3 (three) times daily as needed.        . AMITIZA 8 MCG capsule TAKE (1) CAPSULE DAILY.  30 each  11  . ARMOUR THYROID 90 MG tablet TAKE 1 TABLET ONCE DAILY.  30 tablet  6  . benzonatate (TESSALON) 200 MG capsule Take 1 capsule (200 mg total) by mouth 2 (two) times daily as needed for cough.  30 capsule  3  . budesonide-formoterol (SYMBICORT) 160-4.5 MCG/ACT inhaler Inhale 2 puffs into the lungs 2 (two) times daily.        Marland Kitchen  buPROPion (WELLBUTRIN XL) 150 MG 24 hr tablet TAKE 2 TABLETS IN THE MORNING AND 1 TABLET IN THE EVENING.  90 tablet  3  . clotrimazole-betamethasone (LOTRISONE) cream Apply topically 2 (two) times daily.  30 g  0  . fexofenadine-pseudoephedrine (ALLEGRA-D) 60-120 MG per tablet Take 1 tablet by mouth daily as needed.        . hyoscyamine (LEVSIN SL) 0.125 MG SL tablet Place 0.125 mg under the tongue every 4 (four) hours as needed.        Marland Kitchen ipratropium-albuterol (DUONEB) 0.5-2.5 (3) MG/3ML SOLN Take 3 mLs by nebulization as needed.        Marland Kitchen losartan (COZAAR) 100 MG tablet Take 1 tablet (100 mg total) by mouth daily.  90 tablet  3  . mometasone (NASONEX) 50 MCG/ACT nasal spray       . montelukast (SINGULAIR) 10 MG tablet Take 10 mg by mouth daily as needed.       Earney Navy Bicarbonate (ZEGERID) 20-1100 MG CAPS Take 1 capsule by mouth at bedtime as needed.       . phentermine (ADIPEX-P) 37.5 MG tablet TAKE 1 TABLET IN THE MORNING.  30 tablet  2  . potassium chloride (K-DUR) 10 MEQ tablet TAKE 1 TABLET DAILY.  30 tablet  11  . torsemide (DEMADEX) 100 MG tablet Take 1 tablet (100 mg total) by mouth daily.  30 tablet  11  . tretinoin (RETIN-A) 0.025 % cream Apply topically at bedtime.  45 g  3  . [DISCONTINUED] K-DUR 10 MEQ tablet TAKE 1 TABLET DAILY.  30 each  11   No current facility-administered medications on file prior to visit.    BP 130/80  Pulse 72  Temp(Src) 98.2 F (36.8 C)  Resp 16  Ht 5' 4.25" (1.632 m)  Wt 203 lb (92.08 kg)  BMI 34.57 kg/m2       Objective:   Physical Exam  Constitutional: She appears well-developed and well-nourished.  HENT:  Head: Normocephalic and atraumatic.  Eyes: Conjunctivae are normal. Pupils are equal, round, and reactive to light.  Neck: Normal range of motion. Neck supple.  Cardiovascular: Normal rate and regular rhythm.   Murmur heard. Pulmonary/Chest: Effort normal and breath sounds normal.  Musculoskeletal: She exhibits edema and tenderness.  Skin: Skin is warm and dry.  Psychiatric: She has a normal mood and affect. Her behavior is normal.          Assessment & Plan:

## 2013-04-15 LAB — URINE CULTURE: Colony Count: 40000

## 2013-04-22 ENCOUNTER — Other Ambulatory Visit: Payer: Self-pay

## 2013-04-23 ENCOUNTER — Encounter: Payer: Self-pay | Admitting: Internal Medicine

## 2013-04-23 DIAGNOSIS — N179 Acute kidney failure, unspecified: Secondary | ICD-10-CM

## 2013-04-26 ENCOUNTER — Encounter: Payer: Self-pay | Admitting: Internal Medicine

## 2013-04-26 DIAGNOSIS — E039 Hypothyroidism, unspecified: Secondary | ICD-10-CM

## 2013-04-26 DIAGNOSIS — R319 Hematuria, unspecified: Secondary | ICD-10-CM

## 2013-04-27 MED ORDER — LEVOTHYROXINE SODIUM 75 MCG PO TABS: 75.0000 ug | ORAL_TABLET | Freq: Every day | ORAL | Status: DC

## 2013-04-27 MED ORDER — LIOTHYRONINE SODIUM 25 MCG PO TABS: 25.0000 ug | ORAL_TABLET | Freq: Every day | ORAL | Status: DC

## 2013-04-27 NOTE — Telephone Encounter (Signed)
Changed meds due to low t3 free

## 2013-05-12 ENCOUNTER — Encounter: Payer: Self-pay | Admitting: Internal Medicine

## 2013-05-13 ENCOUNTER — Other Ambulatory Visit: Payer: Self-pay | Admitting: Internal Medicine

## 2013-05-13 NOTE — Telephone Encounter (Deleted)
everything is correct for your lab appointment

## 2013-05-14 ENCOUNTER — Other Ambulatory Visit (INDEPENDENT_AMBULATORY_CARE_PROVIDER_SITE_OTHER): Payer: Medicare Other

## 2013-05-14 DIAGNOSIS — N179 Acute kidney failure, unspecified: Secondary | ICD-10-CM

## 2013-05-14 LAB — POCT URINALYSIS DIPSTICK
Bilirubin, UA: NEGATIVE
Leukocytes, UA: NEGATIVE
Nitrite, UA: NEGATIVE
Protein, UA: NEGATIVE
Urobilinogen, UA: 0.2
pH, UA: 5.5

## 2013-05-15 LAB — BASIC METABOLIC PANEL
BUN: 35 mg/dL — ABNORMAL HIGH (ref 6–23)
Calcium: 9.9 mg/dL (ref 8.4–10.5)
GFR: 34.28 mL/min — ABNORMAL LOW (ref >60.00)
Potassium: 4.8 mEq/L (ref 3.5–5.1)

## 2013-05-25 ENCOUNTER — Other Ambulatory Visit: Payer: Self-pay | Admitting: *Deleted

## 2013-05-25 MED ORDER — PHENTERMINE HCL 37.5 MG PO TABS: ORAL_TABLET | ORAL | Status: DC

## 2013-06-23 ENCOUNTER — Other Ambulatory Visit: Payer: Self-pay | Admitting: Internal Medicine

## 2013-07-04 ENCOUNTER — Other Ambulatory Visit: Payer: Self-pay | Admitting: Internal Medicine

## 2013-07-23 ENCOUNTER — Other Ambulatory Visit: Payer: Self-pay

## 2013-07-24 ENCOUNTER — Other Ambulatory Visit: Payer: Self-pay | Admitting: *Deleted

## 2013-07-24 ENCOUNTER — Encounter: Payer: Self-pay | Admitting: Internal Medicine

## 2013-07-24 ENCOUNTER — Ambulatory Visit (INDEPENDENT_AMBULATORY_CARE_PROVIDER_SITE_OTHER): Payer: Medicare Other | Admitting: Internal Medicine

## 2013-07-24 VITALS — BP 130/80 | HR 76 | Temp 98.2°F | Resp 16 | Ht 64.25 in | Wt 202.0 lb

## 2013-07-24 DIAGNOSIS — Z23 Encounter for immunization: Secondary | ICD-10-CM

## 2013-07-24 DIAGNOSIS — M899 Disorder of bone, unspecified: Secondary | ICD-10-CM

## 2013-07-24 DIAGNOSIS — N183 Chronic kidney disease, stage 3 unspecified: Secondary | ICD-10-CM

## 2013-07-24 DIAGNOSIS — M898X9 Other specified disorders of bone, unspecified site: Secondary | ICD-10-CM

## 2013-07-24 LAB — BASIC METABOLIC PANEL
CO2: 26 mEq/L (ref 19–32)
Calcium: 9.6 mg/dL (ref 8.4–10.5)
Chloride: 101 mEq/L (ref 96–112)
Glucose, Bld: 85 mg/dL (ref 70–99)
Potassium: 4.3 mEq/L (ref 3.5–5.3)
Sodium: 136 mEq/L (ref 135–145)

## 2013-07-24 LAB — MAGNESIUM: Magnesium: 2.2 mg/dL (ref 1.5–2.5)

## 2013-07-24 LAB — SEDIMENTATION RATE: Sed Rate: 6 mm/hr (ref 0–22)

## 2013-07-24 NOTE — Patient Instructions (Signed)
The patient is instructed to continue all medications as prescribed. Schedule followup with check out clerk upon leaving the clinic  

## 2013-07-24 NOTE — Addendum Note (Signed)
Addended by: Allyne Gee on: 07/24/2013 05:40 PM   Modules accepted: Orders

## 2013-07-24 NOTE — Progress Notes (Signed)
Pre-visit discussion using our clinic review tool. No additional management support is needed unless otherwise documented below in the visit note.  

## 2013-07-24 NOTE — Progress Notes (Signed)
Subjective:    Patient ID: Leslie Edwards, female    DOB: 07-23-1946, 67 y.o.   MRN: [id number]  HPI HTn and hypothyroidism GERD Asthma Needs prevnar   Review of Systems  Constitutional: Positive for fatigue. Negative for activity change and appetite change.  HENT: Negative for congestion, ear pain, postnasal drip and sinus pressure.   Eyes: Negative for redness and visual disturbance.  Respiratory: Positive for shortness of breath. Negative for cough and wheezing.   Gastrointestinal: Negative for abdominal pain and abdominal distention.  Genitourinary: Negative for dysuria, frequency and menstrual problem.  Musculoskeletal: Negative for arthralgias, joint swelling, myalgias and neck pain.  Skin: Negative for rash and wound.  Neurological: Positive for light-headedness. Negative for dizziness, weakness and headaches.  Hematological: Negative for adenopathy. Does not bruise/bleed easily.  Psychiatric/Behavioral: Negative for sleep disturbance and decreased concentration.   Past Medical History  Diagnosis Date  . Allergy   . Anxiety   . Asthma   . Hypertension   . GERD (gastroesophageal reflux disease)   . Hypothyroidism   . Sprue     History   Social History  . Marital Status: Married    Spouse Name: N/A    Number of Children: N/A  . Years of Education: N/A   Occupational History  . Not on file.   Social History Main Topics  . Smoking status: Former Smoker    Quit date: 01/22/1975  . Smokeless tobacco: Not on file  . Alcohol Use: 1.8 oz/week    3 Glasses of wine per week  . Drug Use: No  . Sexual Activity: Not Currently   Other Topics Concern  . Not on file   Social History Narrative  . No narrative on file    Past Surgical History  Procedure Laterality Date  . Carpal tunnel release    . Tonsillectomy    . Nissan fundiplication    . Knee arthroscopy      right  . Lipoma excision  1965    axilary    Family History  Problem Relation Age of Onset   . Cancer Mother     lung  . Macular degeneration Father   . Cancer Maternal Uncle     colon  . Cancer Maternal Aunt     esophageal cancer    Allergies  Allergen Reactions  . Penicillins     REACTION: difficulty breathing  . Prochlorperazine Edisylate     Current Outpatient Prescriptions on File Prior to Visit  Medication Sig Dispense Refill  . albuterol (PROVENTIL HFA;VENTOLIN HFA) 108 (90 BASE) MCG/ACT inhaler Inhale 2 puffs into the lungs every 6 (six) hours as needed.        . ALPRAZolam (XANAX) 0.25 MG tablet Take 0.25 mg by mouth 3 (three) times daily as needed.        . AMITIZA 8 MCG capsule TAKE (1) CAPSULE DAILY.  30 capsule  11  . benzonatate (TESSALON) 200 MG capsule Take 1 capsule (200 mg total) by mouth 2 (two) times daily as needed for cough.  30 capsule  3  . budesonide-formoterol (SYMBICORT) 160-4.5 MCG/ACT inhaler Inhale 2 puffs into the lungs 2 (two) times daily.        Marland Kitchen buPROPion (WELLBUTRIN XL) 150 MG 24 hr tablet TAKE 2 TABLETS IN THE MORNING AND 1 TABLET IN THE EVENING.  90 tablet  6  . fexofenadine-pseudoephedrine (ALLEGRA-D) 60-120 MG per tablet Take 1 tablet by mouth daily as needed.        Marland Kitchen  hyoscyamine (LEVSIN SL) 0.125 MG SL tablet Place 0.125 mg under the tongue every 4 (four) hours as needed.        Marland Kitchen ipratropium-albuterol (DUONEB) 0.5-2.5 (3) MG/3ML SOLN Take 3 mLs by nebulization as needed.        Marland Kitchen levothyroxine (SYNTHROID, LEVOTHROID) 75 MCG tablet Take 1 tablet (75 mcg total) by mouth daily.  90 tablet  3  . liothyronine (CYTOMEL) 25 MCG tablet Take 1 tablet (25 mcg total) by mouth daily.  30 tablet  4  . mometasone (NASONEX) 50 MCG/ACT nasal spray       . montelukast (SINGULAIR) 10 MG tablet Take 10 mg by mouth daily as needed.       Earney Navy Bicarbonate (ZEGERID) 20-1100 MG CAPS Take 1 capsule by mouth at bedtime as needed.       . phentermine (ADIPEX-P) 37.5 MG tablet TAKE 1 TABLET IN THE MORNING.  30 tablet  3  . potassium chloride  (K-DUR) 10 MEQ tablet TAKE 1 TABLET DAILY.  30 tablet  11  . tretinoin (RETIN-A) 0.025 % cream Apply topically at bedtime.  45 g  3  . [DISCONTINUED] K-DUR 10 MEQ tablet TAKE 1 TABLET DAILY.  30 each  11   No current facility-administered medications on file prior to visit.    BP 130/80  Pulse 76  Temp(Src) 98.2 F (36.8 C)  Resp 16  Ht 5' 4.25" (1.632 m)  Wt 202 lb (91.627 kg)  BMI 34.40 kg/m2       Objective:   Physical Exam  Constitutional: She is oriented to person, place, and time. She appears well-developed and well-nourished. No distress.  HENT:  Head: Normocephalic and atraumatic.  Eyes: Conjunctivae and EOM are normal. Pupils are equal, round, and reactive to light.  Neck: Normal range of motion. Neck supple. No JVD present. No tracheal deviation present. No thyromegaly present.  Cardiovascular: Normal rate and regular rhythm.   Murmur heard. Pulmonary/Chest: Effort normal and breath sounds normal. She has no wheezes. She exhibits no tenderness.  Abdominal: Soft. Bowel sounds are normal.  Musculoskeletal: Normal range of motion. She exhibits no edema and no tenderness.  Lymphadenopathy:    She has no cervical adenopathy.  Neurological: She is alert and oriented to person, place, and time. She has normal reflexes. No cranial nerve deficit.  Skin: Skin is warm and dry. She is not diaphoretic.  Psychiatric: She has a normal mood and affect. Her behavior is normal.          Assessment & Plan:  prevnar for pnuemovax coverage due to asthma history Stable HTN monitor bmet for renal insufficiency stage 3  On 40 of torosemide   Due to bone pain rule out parathyroid disease

## 2013-08-02 ENCOUNTER — Encounter: Payer: Self-pay | Admitting: Internal Medicine

## 2013-08-03 ENCOUNTER — Other Ambulatory Visit: Payer: Self-pay | Admitting: *Deleted

## 2013-08-03 DIAGNOSIS — E213 Hyperparathyroidism, unspecified: Secondary | ICD-10-CM

## 2013-08-17 ENCOUNTER — Encounter (HOSPITAL_COMMUNITY)
Admission: RE | Admit: 2013-08-17 | Discharge: 2013-08-17 | Disposition: A | Discharge status: Home / Self Care | Payer: Medicare Other | Source: Ambulatory Visit | Attending: Internal Medicine | Admitting: Internal Medicine

## 2013-08-17 DIAGNOSIS — E213 Hyperparathyroidism, unspecified: Secondary | ICD-10-CM

## 2013-08-17 MED ORDER — TECHNETIUM TC 99M SESTAMIBI - CARDIOLITE
25.0000 | Freq: Once | INTRAVENOUS | Status: AC | PRN
  Administered 2013-08-17: 08:00:00 25 via INTRAVENOUS

## 2013-08-21 ENCOUNTER — Other Ambulatory Visit: Payer: Self-pay | Admitting: *Deleted

## 2013-08-21 ENCOUNTER — Encounter: Payer: Self-pay | Admitting: Internal Medicine

## 2013-08-21 DIAGNOSIS — R7989 Other specified abnormal findings of blood chemistry: Secondary | ICD-10-CM

## 2013-08-31 ENCOUNTER — Encounter: Payer: Self-pay | Admitting: Internal Medicine

## 2013-08-31 ENCOUNTER — Ambulatory Visit (INDEPENDENT_AMBULATORY_CARE_PROVIDER_SITE_OTHER): Payer: Medicare Other | Admitting: Internal Medicine

## 2013-08-31 VITALS — BP 126/78 | HR 83 | Temp 98.1°F | Resp 10 | Ht 64.0 in | Wt 195.0 lb

## 2013-08-31 DIAGNOSIS — E213 Hyperparathyroidism, unspecified: Secondary | ICD-10-CM | POA: Insufficient documentation

## 2013-08-31 DIAGNOSIS — E039 Hypothyroidism, unspecified: Secondary | ICD-10-CM

## 2013-08-31 DIAGNOSIS — E559 Vitamin D deficiency, unspecified: Secondary | ICD-10-CM

## 2013-08-31 DIAGNOSIS — D649 Anemia, unspecified: Secondary | ICD-10-CM

## 2013-08-31 HISTORY — DX: Hyperparathyroidism, unspecified: E21.3

## 2013-08-31 LAB — CBC
HCT: 38.4 % (ref 36.0–46.0)
Hemoglobin: 13.1 g/dL (ref 12.0–15.0)
MCHC: 34 g/dL (ref 30.0–36.0)
Platelets: 330 10*3/uL (ref 150.0–400.0)
RDW: 13.6 % (ref 11.5–14.6)
WBC: 6.4 10*3/uL (ref 4.5–10.5)

## 2013-08-31 LAB — T4, FREE: Free T4: 0.75 ng/dL (ref 0.60–1.60)

## 2013-08-31 LAB — T3, FREE: T3, Free: 2.6 pg/mL (ref 2.3–4.2)

## 2013-08-31 LAB — TSH: TSH: 0.06 u[IU]/mL — ABNORMAL LOW (ref 0.35–5.50)

## 2013-08-31 NOTE — Patient Instructions (Signed)
Please stop at the lab. I will send you the results through [facility]. Please make sure you get ~1200 mg calcium from diet. If you do not, then try to get the rest from calcium supplements - you need to take these with lunch. Take iron or iron-containing vitamins at bedtime, on an empty stomach.  Dietary sources of calcium and vitamin D:  Calcium content (mg) - http://www.niams.MoviePins.co.za  Fortified oatmeal, 1 packet 350  Sardines, canned in oil, with edible bones, 3 oz. 324  Cheddar cheese, 1 oz. shredded 306  Milk, nonfat, 1 cup 302  Milkshake, 1 cup 300  Yogurt, plain, low-fat, 1 cup 300  Soybeans, cooked, 1 cup 261  Tofu, firm, with calcium,  cup 204  Orange juice, fortified with calcium, 6 oz. 200-260 (varies)  Salmon, canned, with edible bones, 3 oz. 181  Pudding, instant, made with 2% milk,  cup 153  Baked beans, 1 cup [place], 1% milk fat, 1 cup 138  Spaghetti, lasagna, 1 cup 125  Frozen yogurt, vanilla, soft-serve,  cup 103  Ready-to-eat cereal, fortified with calcium, 1 cup 100-1,000 (varies)  Cheese pizza, 1 slice [id number]  Fortified waffles, 2 100  Turnip greens, boiled,  cup 99  Broccoli, raw, 1 cup 90  Ice cream, vanilla,  cup 85  Soy or rice milk, fortified with calcium, 1 cup 80-500 (varies)   Vitamin D content (International Units, IU) - https://www.ars.usda.gov Cod liver oil, 1 tablespoon 1,360  Swordfish, cooked, 3 oz 566  Salmon (sockeye), cooked, 3 oz 447  Tuna fish, canned in water, drained, 3 oz 154  Orange juice fortified with vitamin D, 1 cup (check product labels, as amount of added vitamin D varies) 137  Milk, nonfat, reduced fat, and whole, vitamin D-fortified, 1 cup 115-124  Yogurt, fortified with 20% of the daily value for vitamin D, 6 oz 80  Margarine, fortified, 1 tablespoon 60  Sardines, canned in oil, drained, 2 sardines 46  Liver, beef, cooked, 3 oz 42  Egg, 1 large (vitamin D is found in yolk) 41   Ready-to-eat cereal, fortified with 10% of the daily value for vitamin D, 0.75-1 cup  40  Cheese, Swiss, 1 oz 6

## 2013-08-31 NOTE — Progress Notes (Signed)
Patient ID: Leslie Edwards, female   DOB: 07-Jun-1946, 67 y.o..o.   MRN: [id number]   HPI  Leslie Edwards is a 67 y.o.-year-old female, referred by her PCP, Dr. Arnoldo Morale, for evaluation for normocalcemic hyperparathyroidism.   I reviewed pt's pertinent labs: Lab Results  Component Value Date   PTH 126.1* 07/24/2013   CALCIUM 9.6 07/24/2013   CALCIUM 9.6 07/24/2013   CALCIUM 9.9 05/14/2013   CALCIUM 9.7 04/13/2013   CALCIUM 9.7 12/12/2012   CALCIUM 9.7 06/24/2012   CALCIUM 9.1 03/11/2012   CALCIUM 9.7 04/28/2009   CALCIUM 9.4 01/28/2009   CALCIUM 10.0 10/08/2008   CALCIUM 9.5 05/06/2007  Mg level 2.2 on 07/24/2013. Pt has a h/o vitamin D deficiency, but the most recent vit D level was in 2008, at 40.  Patient also had technetium sestamibi scan on 08/17/2013, which was normal.  I reviewed pt's DEXA scans: Date L1-L4 T score FN T score 33% distal Radius  07/31/2007 0 LFN: -1.1, RFN: - 1.3 n/a  She lost ~ 2 in in height from youth height. She had a patella fracture in 2005 after falling on black ice.   No h/o kidney stones.  No h/o CKD. Last BUN/Cr: Lab Results  Component Value Date   BUN 36* 07/24/2013   CREATININE 1.39* 07/24/2013   She was dx with celiac ds based on blood work, but EGD showed no villous atrophy - of note, this was done after she had already started gluten-free diet.  She had anemia in 02/2012 (11.9) >> started prenatal vitamins (for the iron), but taking them with dinner.  Pt is on vitamin D 1000 IU - was taking calcium but constipation >> stopped, now taking Magnesium; she does not drink dairy but uses Almond milk - only in smoothies during the summer; she eats cheese; she does eat green, leafy, vegetables, 2-4 x a week.   She is walking for weight bearing exercises. She tried to exercise but had increase in joint pain and mm pain, along with mm stiffness. Last B12 level normal, at 835 in 12/24/2012. Folate>20 then.  Pt does not have a FH of hypercalcemia, pituitary  tumors, thyroid cancer, or osteoporosis.   Lab Results  Component Value Date   TSH 0.24* 04/13/2013   TSH 1.23 09/25/2011   TSH 0.65 11/22/2010   TSH 0.42 03/07/2010   TSH 1.34 01/03/2010   FREET4 1.04 04/13/2013   FREET4 0.92 09/25/2011   FREET4 1.01 03/07/2010   FREET4 1.4 01/03/2010   FREET4 1.1 06/14/2008   I reviewed her chart and she also has a history of GERD, had Barrett Es (?) - Nissen fundoplication in [id number] >> helped but still has some dysphagia.  ROS: Constitutional: no weight gain/loss, + increased appetite, + fatigue, + subjective hypothermia Eyes: no blurry vision, no xerophthalmia ENT: no sore throat, no nodules palpated in throat, + dysphagia/no odynophagia, + hoarseness Cardiovascular: no CP/SOB/palpitations/+ leg swelling Respiratory: + cough/no SOB Gastrointestinal: + N/no V/no D/+ C/+ heartburn Musculoskeletal: + both: muscle/joint aches Skin: + rash, + easy bruising, + itching Neurological: no tremors/numbness/tingling/dizziness Psychiatric: no depression/anxiety  Past Medical History  Diagnosis Date  . Allergy   . Anxiety   . Asthma   . Hypertension   . GERD (gastroesophageal reflux disease)   . Hypothyroidism   . Sprue    Past Surgical History  Procedure Laterality Date  . Carpal tunnel release    . Tonsillectomy    . Nissan fundiplication    . Knee  arthroscopy      right  . Lipoma excision  1965    axilary   History   Social History  . Marital Status: Married    Spouse Name: N/A    Number of Children: 0, 1 adopted son   Occupational History  . Retired; Chief Technology Officer at [employer]  . Smoking status: Former Smoker    Quit date: 01/22/1975  . Smokeless tobacco: Not on file  . Alcohol Use: 1.8 oz/week    3 Glasses of wine per week  . Drug Use: No  . Sexual Activity: Not Currently  Menopause in 2001.   Social History Narrative   Regular exercise: walking   Caffeine use: yes   Current Outpatient  Prescriptions on File Prior to Visit  Medication Sig Dispense Refill  . albuterol (PROVENTIL HFA;VENTOLIN HFA) 108 (90 BASE) MCG/ACT inhaler Inhale 2 puffs into the lungs every 6 (six) hours as needed.        . ALPRAZolam (XANAX) 0.25 MG tablet Take 0.25 mg by mouth 3 (three) times daily as needed.        . AMITIZA 8 MCG capsule TAKE (1) CAPSULE DAILY.  30 capsule  11  . benzonatate (TESSALON) 200 MG capsule Take 1 capsule (200 mg total) by mouth 2 (two) times daily as needed for cough.  30 capsule  3  . budesonide-formoterol (SYMBICORT) 160-4.5 MCG/ACT inhaler Inhale 2 puffs into the lungs 2 (two) times daily.        Marland Kitchen buPROPion (WELLBUTRIN XL) 150 MG 24 hr tablet TAKE 2 TABLETS IN THE MORNING AND 1 TABLET IN THE EVENING.  90 tablet  6  . fexofenadine-pseudoephedrine (ALLEGRA-D) 60-120 MG per tablet Take 1 tablet by mouth daily as needed.        . hyoscyamine (LEVSIN SL) 0.125 MG SL tablet Place 0.125 mg under the tongue every 4 (four) hours as needed.        Marland Kitchen ipratropium-albuterol (DUONEB) 0.5-2.5 (3) MG/3ML SOLN Take 3 mLs by nebulization as needed.        Marland Kitchen levothyroxine (SYNTHROID, LEVOTHROID) 75 MCG tablet Take 1 tablet (75 mcg total) by mouth daily.  90 tablet  3  . liothyronine (CYTOMEL) 25 MCG tablet Take 1 tablet (25 mcg total) by mouth daily.  30 tablet  4  . mometasone (NASONEX) 50 MCG/ACT nasal spray       . montelukast (SINGULAIR) 10 MG tablet Take 10 mg by mouth daily as needed.       Earney Navy Bicarbonate (ZEGERID) 20-1100 MG CAPS Take 1 capsule by mouth at bedtime as needed.       . phentermine (ADIPEX-P) 37.5 MG tablet TAKE 1 TABLET IN THE MORNING.  30 tablet  3  . potassium chloride (K-DUR) 10 MEQ tablet TAKE 1 TABLET DAILY.  30 tablet  11  . torsemide (DEMADEX) 20 MG tablet take 2 tabs daily      . tretinoin (RETIN-A) 0.025 % cream Apply topically at bedtime.  45 g  3  . torsemide (DEMADEX) 100 MG tablet Take 40 mg by mouth daily.      . [DISCONTINUED] K-DUR 10 MEQ  tablet TAKE 1 TABLET DAILY.  30 each  11   No current facility-administered medications on file prior to visit.   Allergies  Allergen Reactions  . Penicillins     REACTION: difficulty breathing  . Prochlorperazine Edisylate    Family History  Problem Relation Age of Onset  .  Cancer Mother     lung  . Macular degeneration Father   . Cancer Maternal Uncle     colon  . Cancer Maternal Aunt     esophageal cancer   PE: BP 126/78  Pulse 83  Temp(Src) 98.1 F (36.7 C) (Oral)  Resp 10  Ht 5\' 4"  (1.626 m)  Wt 195 lb (88.451 kg)  BMI 33.46 kg/m2  SpO2 98% Wt Readings from Last 3 Encounters:  08/31/13 195 lb (88.451 kg)  07/24/13 202 lb (91.627 kg)  04/13/13 203 lb (92.08 kg)   Constitutional: overweight, in NAD. No kyphosis. Eyes: PERRLA, EOMI, no exophthalmos ENT: moist mucous membranes, no thyromegaly, no cervical lymphadenopathy Cardiovascular: RRR, No MRG Respiratory: CTA B Gastrointestinal: abdomen soft, NT, ND, BS+ Musculoskeletal: no deformities, strength intact in all 4 Skin: moist, warm, no rashes Neurological: no tremor with outstretched hands, DTR normal in all 4  Assessment: 1. Hypercalcemia/hyperparathyroidism  Plan: Patient has had an elevated PTH level, in the setting of normal calcium level. She never had an elevated calcium level, per review of the chart and per her recall. It is unclear whether she currently has vitamin D deficiency, but she was told she had this in the past, when her level returned at 37 in 2008. She is on supplementation with vitamin D, but not with calcium. She has a history of possible celiac disease. No h/o nephrolithiasis, no clear osteoporosis, but had a patellar fracture 2/2 falling on ice several years ago. No abdominal pain, depression. She has joint, bone, and muscle pains. - I discussed with the patient about the physiology of calcium and parathyroid hormone, and possible side effects from increased PTH, including kidney stones,  osteoporosis, abdominal pain, etc. We discussed that a low vitamin D can >> high PTH, in an effort to maintain a normal calcium levels. Therefore, the first thing that we need to make sure of is that she does not have vitamin D deficiency. If she is not vit D deficient, we have to make sure she gets enough calcium in her daily diet, especially with her h/o celiac ds. - she could have normocalcemic primary hyperparathyroidism, but she does not meet criteria for parathyroid surgery as of now:  Increased calcium by more than 1 mg/dL above the upper limit of normal  Kidney ds.  Osteoporosis (?) Age <53 years old New criteria for surgery also include a high 24-hour urinary calcium (more than 400 mg per 24-hour) - we discussed about getting a new DEXA scan to see if she her BMD has worsened. I would add a 33% distal radius for evaluation of cortical bone. We also might need a 24-hour urinary calcium. - I gave her instructions about how to maintain approximately 1200 mg of calcium in her daily diet, and also at least 2000 units of vitamin D total daily dose. - the fact that her parathyroid nuclear medicine scan was normal, makes me think that this is either a case of secondary hyperparathyroidism from vitamin D deficiency or from decreased dietary calcium. - since her previous TSH was low, and since hyperthyroidism can affect her from the point of view of fatigue and sometimes even muscle/bone pain, will recheck her thyroid tests today. She has a history of anemia, and since this can cause fatigue too, I would check a CBC. - I will see the patient back in 2 months  Office Visit on 08/31/2013  Component Date Value Range Status  . TSH 08/31/2013 0.06* 0.35 - 5.50 uIU/mL Final  .  Free T4 08/31/2013 0.75  0.60 - 1.60 ng/dL Final  . T3, Free 08/31/2013 2.6  2.3 - 4.2 pg/mL Final  . Vit D, 25-Hydroxy 08/31/2013 60  30 - 89 ng/mL Final   Comment: This assay accurately quantifies Vitamin D, which is the sum of  the                          25-Hydroxy forms of Vitamin D2 and D3.  Studies have shown that the                          optimum concentration of 25-Hydroxy Vitamin D is 30 ng/mL or higher.                           Concentrations of Vitamin D between 20 and 29 ng/mL are considered to                          be insufficient and concentrations less than 20 ng/mL are considered                          to be deficient for Vitamin D.  . WBC 08/31/2013 6.4  4.5 - 10.5 K/uL Final  . RBC 08/31/2013 4.09  3.87 - 5.11 Mil/uL Final  . Platelets 08/31/2013 330.0  150.0 - 400.0 K/uL Final  . Hemoglobin 08/31/2013 13.1  12.0 - 15.0 g/dL Final  . HCT 08/31/2013 38.4  36.0 - 46.0 % Final  . MCV 08/31/2013 93.8  78.0 - 100.0 fl Final  . MCHC 08/31/2013 34.0  30.0 - 36.0 g/dL Final  . RDW 08/31/2013 13.6  11.5 - 14.6 % Final   Vitamin D normal.  TSH very suppressed >> will reduce the dose of free T3 (Cytomel).   Will recheck her TFTs at next visit and will repeat then the PTH, calcium, and also Will check a 24-hour urine for calcium at that time.

## 2013-09-01 LAB — VITAMIN D 25 HYDROXY (VIT D DEFICIENCY, FRACTURES): Vit D, 25-Hydroxy: 60 ng/mL (ref 30–89)

## 2013-09-01 MED ORDER — LIOTHYRONINE SODIUM 25 MCG PO TABS: 12.5000 ug | ORAL_TABLET | Freq: Every day | ORAL | Status: DC

## 2013-11-02 ENCOUNTER — Ambulatory Visit (INDEPENDENT_AMBULATORY_CARE_PROVIDER_SITE_OTHER): Payer: Medicare Other | Admitting: Internal Medicine

## 2013-11-02 ENCOUNTER — Encounter: Payer: Self-pay | Admitting: Internal Medicine

## 2013-11-02 VITALS — BP 122/72 | HR 76 | Temp 97.6°F | Resp 12 | Wt 204.0 lb

## 2013-11-02 DIAGNOSIS — E039 Hypothyroidism, unspecified: Secondary | ICD-10-CM

## 2013-11-02 DIAGNOSIS — E213 Hyperparathyroidism, unspecified: Secondary | ICD-10-CM

## 2013-11-02 NOTE — Patient Instructions (Signed)
Please stop at the lab. Patient information (Up-to-Date): Collection of a 24-hour urine specimen   - You should collect every drop of urine during each 24-hour period. It does not matter how much or little urine is passed each time, as long as every drop is collected. - Begin the urine collection in the morning after you wake up, after you have emptied your bladder for the first time. - Urinate (empty the bladder) for the first time and flush it down the toilet. Note the exact time (eg, 6:15 AM). You will begin the urine collection at this time. - Collect every drop of urine during the day and night in an empty collection bottle. Store the bottle at room temperature or in the refrigerator. - If you need to have a bowel movement, any urine passed with the bowel movement should be collected. Try not to include feces with the urine collection. If feces does get mixed in, do not try to remove the feces from the urine collection bottle. - Finish by collecting the first urine passed the next morning, adding it to the collection bottle. This should be within ten minutes before or after the time of the first morning void on the first day (which was flushed). In this example, you would try to void between 6:05 and 6:25 on the second day. - If you need to urinate one hour before the final collection time, drink a full glass of water so that you can void again at the appropriate time. If you have to urinate 20 minutes before, try to hold the urine until the proper time. - Please note the exact time of the final collection, even if it is not the same time as when collection began on day 1. - The bottle(s) may be kept at room temperature for a day or two, but should be kept cool or refrigerated for longer periods of time.  For now, continue the same vitamin D and calcium supplementation.

## 2013-11-02 NOTE — Progress Notes (Signed)
Patient ID: Leslie Edwards, female   DOB: 01-27-1946, 68 y.o.   MRN: [id number]   HPI  Leslie Edwards is a 68 y.o.-year-old female, referred by her PCP, Dr. Arnoldo Morale, for evaluation for normocalcemic hyperparathyroidism.   I reviewed pt's pertinent labs: Lab Results  Component Value Date   PTH 126.1* 07/24/2013   CALCIUM 9.6 07/24/2013   CALCIUM 9.6 07/24/2013   CALCIUM 9.9 05/14/2013   CALCIUM 9.7 04/13/2013   CALCIUM 9.7 12/12/2012   CALCIUM 9.7 06/24/2012   CALCIUM 9.1 03/11/2012   CALCIUM 9.7 04/28/2009   CALCIUM 9.4 01/28/2009   CALCIUM 10.0 10/08/2008   CALCIUM 9.5 05/06/2007  Mg level 2.2 on 07/24/2013. Patient also had technetium sestamibi scan on 08/17/2013, which was normal.  Pt has a h/o vitamin D deficiency, with a vit D level of 17 in 2008, but at last visit, 59. She takes vit D 5000 units daily. She started a calcium 600 mg with lunch at last visit. She does not drink dairy but uses Almond milk - in smoothies during the summer; she eats cheese; she does eat green, leafy, vegetables, 2-4 x a week.   She was dx with celiac ds based on blood work, but EGD showed no villous atrophy - of note, this was done after she had already started gluten-free diet.  I reviewed pt's DEXA scans: Date L1-L4 T score FN T score 33% distal Radius  07/31/2007 0 LFN: -1.1, RFN: - 1.3 n/a  She lost ~ 2 in in height from youth height. She had a patella fracture in 2005 after falling on black ice.   She is walking for weight bearing exercises. She tried to exercise but had increase in joint pain and mm pain, along with mm stiffness. She continues to have "growing" pains.   She has hypothyroidism and the last 2 TSH levels were low:  Lab Results  Component Value Date   TSH 0.06* 08/31/2013   TSH 0.24* 04/13/2013   TSH 1.23 09/25/2011   TSH 0.65 11/22/2010   TSH 0.42 03/07/2010   FREET4 0.75 08/31/2013   FREET4 1.04 04/13/2013   FREET4 0.92 09/25/2011   FREET4 1.01 03/07/2010   FREET4 1.4 01/03/2010  We  decreased the Cytomel dose at last visit >> 12.5 mcg.  No h/o kidney stones. Pt has CKD. Last BUN/Cr: Lab Results  Component Value Date   BUN 36* 07/24/2013   CREATININE 1.39* 07/24/2013   She also has a history of GERD, had Barrett Es (?) - Nissen fundoplication in [id number] >> helped but still has some dysphagia.  ROS: Constitutional: no weight gain/loss, + increased appetite, + fatigue, + subjective hypothermia Eyes: no blurry vision, no xerophthalmia ENT: no sore throat, no nodules palpated in throat, + dysphagia/no odynophagia, + hoarseness Cardiovascular: no CP/SOB/palpitations/+ leg swelling Respiratory: + cough/no SOB Gastrointestinal: + N/no V/no D/+ C/+ heartburn Musculoskeletal: + both: muscle/joint aches Skin: + rash, + easy bruising Neurological: no tremors/numbness/tingling/dizziness  I reviewed pt's medications, allergies, PMH, social hx, family hx and no changes required, except as mentioned above.  PE: BP 122/72  Pulse 76  Temp(Src) 97.6 F (36.4 C) (Oral)  Resp 12  Wt 204 lb (92.534 kg)  SpO2 97% Wt Readings from Last 3 Encounters:  11/02/13 204 lb (92.534 kg)  08/31/13 195 lb (88.451 kg)  07/24/13 202 lb (91.627 kg)   Constitutional: overweight, in NAD. No kyphosis. Eyes: PERRLA, EOMI, no exophthalmos ENT: moist mucous membranes, no thyromegaly, no cervical lymphadenopathy Cardiovascular: RRR, No  MRG Respiratory: CTA B Gastrointestinal: abdomen soft, NT, ND, BS+ Musculoskeletal: no deformities, strength intact in all 4 Skin: moist, warm, no rashes Neurological: no tremor with outstretched hands, DTR normal in all 4  Assessment: 1. Hypercalcemia/hyperparathyroidism  2. Hypothyroidism - overly replaced  Plan: 1. Patient has had an elevated PTH level, in the setting of normal calcium level. She never had an elevated calcium level, per review of the chart and per her recall. No vitamin D deficiency. She is on supplementation with vitamin D, and now with  calcium, too. She has a history of possible celiac disease. No h/o nephrolithiasis, no clear osteoporosis, but had a patellar fracture 2/2 falling on ice several years ago. No abdominal pain, depression. She has joint, bone, and muscle pains. - today we will recheck PTH and calcium and a 24-hour urinary calcium. - the fact that her parathyroid nuclear medicine scan was normal, makes me think that this is a case of secondary hyperparathyroidism, likely from decreased dietary calcium. - I will see the patient back in 6 months  Office Visit on 11/02/2013  Component Date Value Ref Range Status  . PTH 11/02/2013 86.3* 14.0 - 72.0 pg/mL Final  . Calcium 11/02/2013 9.5  8.4 - 10.5 mg/dL Final   PTH improved after increasing her daily calcium intake.    Ref Range 3d ago   Calcium, Ur mg/dL 5    Urine calcium 100 - 250 mg/day 155    Comments: Reference Range: Females: < 250 mg/24hrs Low Calcium diet < 200 mg/24hrs  The reference range for a low calcium diet assumes a daily intake of no more than 450 mg Calcium. A high calcium diet is defined as an intake of up to 1000mg  Calcium per day. Additionally, 4 mg/Kg/24 hrs is considered an upper normal limit for additional excretion normalized for body weight.   Urinary calcium still on the low side, which is likely the cause for her HPTH. Will encourage her to increase calcium even further, add another 600 mg calcium citrate with dinner.  2. Hypothyroidism - recheck TFTs today  Component     Latest Ref Rng 11/06/2013  T3, Free     2.3 - 4.2 pg/mL 2.7  Free T4     0.60 - 1.60 ng/dL 0.89  TSH     0.35 - 5.50 uIU/mL 1.34   I called and d/w the pt about maybe stopping Cytomel, but she would like to cobntinue since she has been on Synthroid alone for a long time and she did not get her hypothyroidism under control. She is ion a low dose of Cytomel, and her tests are normal, so we will continue.

## 2013-11-05 LAB — PTH, INTACT AND CALCIUM
CALCIUM: 9.5 mg/dL (ref 8.4–10.5)
PTH: 86.3 pg/mL — AB (ref 14.0–72.0)

## 2013-11-06 ENCOUNTER — Other Ambulatory Visit (INDEPENDENT_AMBULATORY_CARE_PROVIDER_SITE_OTHER): Payer: Medicare Other

## 2013-11-06 DIAGNOSIS — E213 Hyperparathyroidism, unspecified: Secondary | ICD-10-CM

## 2013-11-06 DIAGNOSIS — E039 Hypothyroidism, unspecified: Secondary | ICD-10-CM

## 2013-11-06 LAB — T4, FREE: Free T4: 0.89 ng/dL (ref 0.60–1.60)

## 2013-11-06 LAB — T3, FREE: T3, Free: 2.7 pg/mL (ref 2.3–4.2)

## 2013-11-06 LAB — TSH: TSH: 1.34 u[IU]/mL (ref 0.35–5.50)

## 2013-11-07 LAB — CALCIUM, URINE, 24 HOUR
Calcium, 24 hour urine: 155 mg/d (ref 100–250)
Calcium, Ur: 5 mg/dL

## 2013-11-07 LAB — CREATININE, URINE, 24 HOUR
CREATININE, URINE: 52.4 mg/dL
Creatinine, 24H Ur: 1624 mg/d (ref 700–1800)

## 2013-11-24 ENCOUNTER — Telehealth: Payer: Self-pay | Admitting: Internal Medicine

## 2013-11-24 MED ORDER — PHENTERMINE HCL 37.5 MG PO TABS: ORAL_TABLET | ORAL | Status: DC

## 2013-11-24 NOTE — Telephone Encounter (Signed)
[place] [place] - [address] requesting refill of phentermine (ADIPEX-P) 37.5 MG tablet

## 2013-11-24 NOTE — Telephone Encounter (Signed)
Ok per Dr Arnoldo Morale x3, rx called in to [facility]

## 2013-11-30 ENCOUNTER — Encounter: Payer: Self-pay | Admitting: Internal Medicine

## 2013-11-30 ENCOUNTER — Ambulatory Visit (INDEPENDENT_AMBULATORY_CARE_PROVIDER_SITE_OTHER): Payer: Medicare Other | Admitting: Internal Medicine

## 2013-11-30 VITALS — BP 140/86 | HR 84 | Temp 98.6°F | Ht 64.0 in | Wt 205.0 lb

## 2013-11-30 DIAGNOSIS — E039 Hypothyroidism, unspecified: Secondary | ICD-10-CM

## 2013-11-30 DIAGNOSIS — E201 Pseudohypoparathyroidism: Secondary | ICD-10-CM

## 2013-11-30 MED ORDER — LIOTHYRONINE SODIUM 25 MCG PO TABS: 25.0000 ug | ORAL_TABLET | Freq: Every day | ORAL | Status: DC

## 2013-11-30 NOTE — Patient Instructions (Signed)
The patient is instructed to continue all medications as prescribed. Schedule followup with check out clerk upon leaving the clinic  

## 2013-11-30 NOTE — Progress Notes (Signed)
Pre visit review using our clinic review tool, if applicable. No additional management support is needed unless otherwise documented below in the visit note. 

## 2013-11-30 NOTE — Progress Notes (Signed)
Subjective:    Patient ID: Leslie Edwards, female    DOB: October 23, 1945, 68 y.o.   MRN: [id number]  HPI  Has increased calcium  For secondary hypoparathyroidism The endocrinologist wanted her to get off the cytomel and just use synthroid. She has demonstrated need for both t3 and t4 to alleviate symptomology. Has noted increased temperature intolerance and fatigue as well as weight gain  Has been on increased calcium and vit d for 30 days       Review of Systems  Constitutional: Positive for fatigue. Negative for activity change and appetite change.  HENT: Negative for congestion, ear pain, postnasal drip and sinus pressure.   Eyes: Negative for redness and visual disturbance.  Respiratory: Positive for cough. Negative for shortness of breath and wheezing.   Gastrointestinal: Negative for abdominal pain and abdominal distention.  Genitourinary: Positive for urgency. Negative for dysuria, frequency and menstrual problem.  Musculoskeletal: Positive for arthralgias and myalgias. Negative for joint swelling and neck pain.  Skin: Negative for rash and wound.  Neurological: Positive for weakness. Negative for dizziness and headaches.  Hematological: Negative for adenopathy. Does not bruise/bleed easily.  Psychiatric/Behavioral: Negative for sleep disturbance and decreased concentration.       Past Medical History  Diagnosis Date  . Allergy   . Anxiety   . Asthma   . Hypertension   . GERD (gastroesophageal reflux disease)   . Hypothyroidism   . Sprue     History   Social History  . Marital Status: Married    Spouse Name: N/A    Number of Children: N/A  . Years of Education: N/A   Occupational History  . Not on file.   Social History Main Topics  . Smoking status: Former Smoker    Quit date: 01/22/1975  . Smokeless tobacco: Not on file  . Alcohol Use: 1.8 oz/week    3 Glasses of wine per week  . Drug Use: No  . Sexual Activity: Not Currently   Other Topics  Concern  . Not on file   Social History Narrative   Regular exercise: walking   Caffeine use: yes    Past Surgical History  Procedure Laterality Date  . Carpal tunnel release    . Tonsillectomy    . Nissan fundiplication    . Knee arthroscopy      right  . Lipoma excision  1965    axilary    Family History  Problem Relation Age of Onset  . Cancer Mother     lung  . Macular degeneration Father   . Cancer Maternal Uncle     colon  . Cancer Maternal Aunt     esophageal cancer    Allergies  Allergen Reactions  . Penicillins     REACTION: difficulty breathing  . Prochlorperazine Edisylate     Current Outpatient Prescriptions on File Prior to Visit  Medication Sig Dispense Refill  . albuterol (PROVENTIL HFA;VENTOLIN HFA) 108 (90 BASE) MCG/ACT inhaler Inhale 2 puffs into the lungs every 6 (six) hours as needed.        . ALPRAZolam (XANAX) 0.25 MG tablet Take 0.25 mg by mouth 3 (three) times daily as needed.        . AMITIZA 8 MCG capsule TAKE (1) CAPSULE DAILY.  30 capsule  11  . benzonatate (TESSALON) 200 MG capsule Take 1 capsule (200 mg total) by mouth 2 (two) times daily as needed for cough.  30 capsule  3  . budesonide-formoterol (SYMBICORT)  160-4.5 MCG/ACT inhaler Inhale 2 puffs into the lungs 2 (two) times daily.        Marland Kitchen buPROPion (WELLBUTRIN XL) 150 MG 24 hr tablet TAKE 2 TABLETS IN THE MORNING AND 1 TABLET IN THE EVENING.  90 tablet  6  . fexofenadine-pseudoephedrine (ALLEGRA-D) 60-120 MG per tablet Take 1 tablet by mouth daily as needed.        . hyoscyamine (LEVSIN SL) 0.125 MG SL tablet Place 0.125 mg under the tongue every 4 (four) hours as needed.        Marland Kitchen ipratropium-albuterol (DUONEB) 0.5-2.5 (3) MG/3ML SOLN Take 3 mLs by nebulization as needed.        Marland Kitchen levothyroxine (SYNTHROID, LEVOTHROID) 75 MCG tablet Take 1 tablet (75 mcg total) by mouth daily.  90 tablet  3  . liothyronine (CYTOMEL) 25 MCG tablet Take 0.5 tablets (12.5 mcg total) by mouth daily.  30  tablet  4  . mometasone (NASONEX) 50 MCG/ACT nasal spray       . montelukast (SINGULAIR) 10 MG tablet Take 10 mg by mouth daily as needed.       Earney Navy Bicarbonate (ZEGERID) 20-1100 MG CAPS Take 1 capsule by mouth at bedtime as needed.       . phentermine (ADIPEX-P) 37.5 MG tablet TAKE 1 TABLET IN THE MORNING.  30 tablet  3  . potassium chloride (K-DUR) 10 MEQ tablet TAKE 1 TABLET DAILY.  30 tablet  11  . torsemide (DEMADEX) 100 MG tablet Take 40 mg by mouth daily.      Marland Kitchen torsemide (DEMADEX) 20 MG tablet take 2 tabs daily      . [DISCONTINUED] K-DUR 10 MEQ tablet TAKE 1 TABLET DAILY.  30 each  11   No current facility-administered medications on file prior to visit.    BP 140/86  Pulse 84  Temp(Src) 98.6 F (37 C) (Oral)  Ht 5\' 4"  (1.626 m)  Wt 205 lb (92.987 kg)  BMI 35.17 kg/m2    Objective:   Physical Exam  Nursing note and vitals reviewed. Constitutional: She appears well-developed and well-nourished.  HENT:  Head: Normocephalic and atraumatic.  Neck: Normal range of motion. Neck supple.  Cardiovascular: Normal rate, regular rhythm and intact distal pulses.   Murmur heard. Pulmonary/Chest: Breath sounds normal.  Abdominal: Bowel sounds are normal.  Musculoskeletal: She exhibits edema and tenderness.  Skin: Skin is warm and dry.  Psychiatric: She has a normal mood and affect.          Assessment & Plan:  Repeat the parathyroid profile on increased calcium Will monitor the PTH to assure that it continues to drop Would resume the cytomel or consider armor thyroid  Needs to have mamogram  I have spent more than 30 minutes examining this patient face-to-face of which over half was spent in counseling reviewed the paleo diet

## 2013-12-03 ENCOUNTER — Other Ambulatory Visit (INDEPENDENT_AMBULATORY_CARE_PROVIDER_SITE_OTHER): Payer: Medicare Other

## 2013-12-03 DIAGNOSIS — E039 Hypothyroidism, unspecified: Secondary | ICD-10-CM

## 2013-12-03 DIAGNOSIS — E201 Pseudohypoparathyroidism: Secondary | ICD-10-CM

## 2013-12-03 NOTE — Patient Instructions (Signed)
, °

## 2013-12-04 LAB — PTH, INTACT AND CALCIUM
Calcium: 9.7 mg/dL (ref 8.4–10.5)
PTH: 139.5 pg/mL — AB (ref 14.0–72.0)

## 2013-12-04 LAB — VITAMIN D 25 HYDROXY (VIT D DEFICIENCY, FRACTURES): VIT D 25 HYDROXY: 61 ng/mL (ref 30–89)

## 2014-01-16 ENCOUNTER — Other Ambulatory Visit: Payer: Self-pay | Admitting: Internal Medicine

## 2014-02-14 ENCOUNTER — Encounter: Payer: Self-pay | Admitting: Internal Medicine

## 2014-03-16 ENCOUNTER — Other Ambulatory Visit: Payer: Self-pay | Admitting: Internal Medicine

## 2014-04-05 ENCOUNTER — Telehealth: Payer: Self-pay | Admitting: Internal Medicine

## 2014-04-05 NOTE — Telephone Encounter (Signed)
My next new patient 11:15 appt is next year. Happy to see her if she is ok waiting this long. Otherwise she could see one of our providers taking over for Dr. Arnoldo Morale - Dr. Yong Channel or Roslynn Amble.

## 2014-04-05 NOTE — Telephone Encounter (Signed)
I did tell her before I sent you the message that you were out till next year. She said that was ok . I will give her a call and let her know .

## 2014-04-05 NOTE — Telephone Encounter (Signed)
Left message for pt to call back Dr Maudie Mercury will take her a transfer from Dr Arnoldo Morale

## 2014-04-05 NOTE — Telephone Encounter (Signed)
Pt is previous pt of Dr Arnoldo Morale and would like to know if you would except her as a new pt.  she has blue medicare

## 2014-04-08 NOTE — Telephone Encounter (Signed)
Pt has been sch

## 2014-04-12 ENCOUNTER — Ambulatory Visit (INDEPENDENT_AMBULATORY_CARE_PROVIDER_SITE_OTHER): Payer: Medicare Other | Admitting: Family Medicine

## 2014-04-12 ENCOUNTER — Encounter: Payer: Self-pay | Admitting: Family Medicine

## 2014-04-12 VITALS — BP 124/88 | HR 96 | Temp 98.4°F | Ht 64.0 in | Wt 201.5 lb

## 2014-04-12 DIAGNOSIS — I10 Essential (primary) hypertension: Secondary | ICD-10-CM

## 2014-04-12 DIAGNOSIS — F329 Major depressive disorder, single episode, unspecified: Secondary | ICD-10-CM

## 2014-04-12 DIAGNOSIS — R609 Edema, unspecified: Secondary | ICD-10-CM

## 2014-04-12 DIAGNOSIS — F419 Anxiety disorder, unspecified: Principal | ICD-10-CM

## 2014-04-12 DIAGNOSIS — F32A Depression, unspecified: Secondary | ICD-10-CM

## 2014-04-12 DIAGNOSIS — K219 Gastro-esophageal reflux disease without esophagitis: Secondary | ICD-10-CM

## 2014-04-12 DIAGNOSIS — E039 Hypothyroidism, unspecified: Secondary | ICD-10-CM

## 2014-04-12 DIAGNOSIS — K900 Celiac disease: Secondary | ICD-10-CM

## 2014-04-12 DIAGNOSIS — F341 Dysthymic disorder: Secondary | ICD-10-CM

## 2014-04-12 DIAGNOSIS — R600 Localized edema: Secondary | ICD-10-CM | POA: Insufficient documentation

## 2014-04-12 DIAGNOSIS — E669 Obesity, unspecified: Secondary | ICD-10-CM

## 2014-04-12 DIAGNOSIS — J309 Allergic rhinitis, unspecified: Secondary | ICD-10-CM

## 2014-04-12 DIAGNOSIS — E213 Hyperparathyroidism, unspecified: Secondary | ICD-10-CM

## 2014-04-12 MED ORDER — LORAZEPAM 0.5 MG PO TABS: 0.5000 mg | ORAL_TABLET | Freq: Two times a day (BID) | ORAL | Status: DC | PRN

## 2014-04-12 MED ORDER — PHENTERMINE HCL 37.5 MG PO TABS: ORAL_TABLET | ORAL | Status: DC

## 2014-04-12 MED ORDER — BUPROPION HCL ER (XL) 150 MG PO TB24: ORAL_TABLET | ORAL | Status: DC

## 2014-04-12 NOTE — Patient Instructions (Addendum)
-  STOP the xanax (use medicine drop box to dispose); use the ATIVAN instead as little as possible for severe anxiety  I advise that you taper off of the phentermine - however we will wait on this per your request until after your medicare physical and your follow up with the endocrinologist:  Taper: -please do 1/2 tablet daily for 1 month -then, 1/4 tablet daily for 1 month -then, every other day for 1 month -then, stop  Follow up with Dr. Cruzita Lederer about your thyroid and parathyroid  Follow up in about 3 months for the Medicare EXAM

## 2014-04-12 NOTE — Progress Notes (Signed)
No chief complaint on file.   HPI:  Leslie Edwards is here to establish care. She is a very pleasant 68 yo female patient, transferring from Dr. Arnoldo Morale. Last PCP and physical:  Has the following chronic problems and concerns today:  Patient Active Problem List   Diagnosis Date Noted  . Lower extremity edema 04/12/2014  . Parathyroid hormone excess 08/31/2013  . ANEURYSM OF ARTERY OF NECK - had neg duplex without aneurysm on ROC and denied per patient 06/13/2010  . CELIAC DISEASE 08/18/2007  . HYPERTENSION 05/06/2007  . ALLERGIC RHINITIS 05/06/2007  . OSTEOPOROSIS 05/06/2007  . HYPOTHYROIDISM 04/28/2007  . OBESITY 04/28/2007  . GERD 04/28/2007   Normocalcemic hyperparathyroidism/hypothyroid/osteopenia: -Managed by Dr. Cruzita Lederer in endocrine - normal nuclear med scan, thought to be secondary to decreased dietary calcium per review of notes -synthroid 75 mcg, cytomel 25 - reports endocrinologist advise stopping cytomel - but she did not feel well off of this and was restarted by Dr. Arnoldo Morale, has follow up with Dr. Cruzita Lederer to discuss -denies palpitations, skin changes  Celiac Sprue/IBS/GERD: -lab dx, endoscopy neg - but done after GF diet -meds: amitza, levsin prn, zegerid prn (omeprazole-sodium bicarb) -chronic constipation and indigestion, bloating and GERD -denies: hematochezia, melena, weight loss -had NF in 2000 -see GI prn  Depression and anxiety -wellbutrin 150, xanax as needed for anxiety 1x per month  -stable -denies: hopelessness, SI  Asthma//allergies: -symbicort prn, duoneb, alb prn, nasonex, singulair, tessalon as needed  Chronic venous insufficiency: -on torsemide and K; reports she did not tolerate lasix due to cramp  Obesity: -on phentermine - has been on this for a number of years  -willing to taper off, did have significant weight loss initially, none the last few years  ROS negative for unless reported above: fevers, unintentional weight loss, hearing  or vision loss, chest pain, palpitations, struggling to breath, hemoptysis, melena, hematochezia, hematuria, falls, loc, si, thoughts of self harm  Past Medical History  Diagnosis Date  . Allergy   . Anxiety   . Asthma   . Hypertension   . GERD (gastroesophageal reflux disease)   . Hypothyroidism   . Sprue   . VENOUS INSUFFICIENCY, CHRONIC 04/28/2007    Qualifier: Diagnosis of  By: Scherrie Gerlach    . Parathyroid hormone excess 08/31/2013    Secondary to low dietary calcium/calcium malabsorption   . OSTEOPOROSIS 05/06/2007    Qualifier: Diagnosis of  By: Arnoldo Morale MD, Balinda Quails   . PLANTAR FASCIITIS, LEFT 07/05/2009    Qualifier: Diagnosis of  By: Arnoldo Morale MD, Balinda Quails   . Rosacea 02/25/2009    Qualifier: Diagnosis of  By: Arnoldo Morale MD, [place], B12 DEFICIENCY 01/28/2009    Qualifier: Diagnosis of  By: Arnoldo Morale MD, Balinda Quails   . UNSPECIFIED IRON DEFICIENCY ANEMIA 01/28/2009    Qualifier: Diagnosis of  By: Arnoldo Morale MD, Balinda Quails     Family History  Problem Relation Age of Onset  . Cancer Mother     lung  . Macular degeneration Father   . Cancer Maternal Uncle     colon  . Cancer Maternal Aunt     esophageal cancer    History   Social History  . Marital Status: Married    Spouse Name: N/A    Number of Children: N/A  . Years of Education: N/A   Social History Main Topics  . Smoking status: Former Smoker    Quit date: 01/22/1975  . Smokeless tobacco: None  .  Alcohol Use: 1.8 oz/week    3 Glasses of wine per week  . Drug Use: No  . Sexual Activity: Not Currently   Other Topics Concern  . None   Social History Narrative   Regular exercise: walking   Works 2 days per week at [employer]   Caffeine use: yes    Current outpatient prescriptions:AMITIZA 8 MCG capsule, TAKE (1) CAPSULE DAILY., Disp: 30 capsule, Rfl: 11;  buPROPion (WELLBUTRIN XL) 150 MG 24 hr tablet, TAKE 2 TABLETS IN THE MORNING AND 1 TABLET IN THE EVENING., Disp: 90 tablet, Rfl: 3;   levothyroxine (SYNTHROID, LEVOTHROID) 75 MCG tablet, Take 1 tablet (75 mcg total) by mouth daily., Disp: 90 tablet, Rfl: 3 liothyronine (CYTOMEL) 25 MCG tablet, Take 1 tablet (25 mcg total) by mouth daily., Disp: 30 tablet, Rfl: 4;  phentermine (ADIPEX-P) 37.5 MG tablet, TAKE 1 TABLET IN THE MORNING., Disp: 30 tablet, Rfl: 1;  potassium chloride (K-DUR) 10 MEQ tablet, TAKE 1 TABLET DAILY., Disp: 30 tablet, Rfl: 11;  torsemide (DEMADEX) 20 MG tablet, take 2 tabs daily, Disp: , Rfl:  albuterol (PROVENTIL HFA;VENTOLIN HFA) 108 (90 BASE) MCG/ACT inhaler, Inhale 2 puffs into the lungs every 6 (six) hours as needed.  , Disp: , Rfl: ;  benzonatate (TESSALON) 200 MG capsule, Take 1 capsule (200 mg total) by mouth 2 (two) times daily as needed for cough., Disp: 30 capsule, Rfl: 3;  budesonide-formoterol (SYMBICORT) 160-4.5 MCG/ACT inhaler, Inhale 2 puffs into the lungs 2 (two) times daily.  , Disp: , Rfl:  fexofenadine-pseudoephedrine (ALLEGRA-D) 60-120 MG per tablet, Take 1 tablet by mouth daily as needed.  , Disp: , Rfl: ;  hyoscyamine (LEVSIN SL) 0.125 MG SL tablet, Place 0.125 mg under the tongue every 4 (four) hours as needed.  , Disp: , Rfl: ;  ipratropium-albuterol (DUONEB) 0.5-2.5 (3) MG/3ML SOLN, Take 3 mLs by nebulization as needed.  , Disp: , Rfl:  LORazepam (ATIVAN) 0.5 MG tablet, Take 1 tablet (0.5 mg total) by mouth 2 (two) times daily as needed for anxiety., Disp: 15 tablet, Rfl: 0;  mometasone (NASONEX) 50 MCG/ACT nasal spray, , Disp: , Rfl: ;  Omeprazole-Sodium Bicarbonate (ZEGERID) 20-1100 MG CAPS, Take 1 capsule by mouth at bedtime as needed. , Disp: , Rfl: ;  [DISCONTINUED] K-DUR 10 MEQ tablet, TAKE 1 TABLET DAILY., Disp: 30 each, Rfl: 11  EXAM:  Filed Vitals:   04/12/14 1111  BP: 124/88  Pulse: 96  Temp: 98.4 F (36.9 C)    Body mass index is 34.57 kg/(m^2).  GENERAL: vitals reviewed and listed above, alert, oriented, appears well hydrated and in no acute distress  HEENT: atraumatic,  conjunttiva clear, no obvious abnormalities on inspection of external nose and ears  NECK: no obvious masses on inspection  LUNGS: clear to auscultation bilaterally, no wheezes, rales or rhonchi, good air movement  CV: HRRR  MS: moves all extremities without noticeable abnormality  PSYCH: pleasant and cooperative, no obvious depression or anxiety  ASSESSMENT AND PLAN:  Discussed the following assessment and plan:  Anxiety and depression - Plan: LORazepam (ATIVAN) 0.5 MG tablet, buPROPion (WELLBUTRIN XL) 150 MG 24 hr tablet -stable on high dose of Wellbutrin and discuss lowering dose at some point if stable -changing to ativan for anxiety, used sparingly for anciety - not really panic attack, discussed risks  OBESITY - Plan: phentermine (ADIPEX-P) 37.5 MG tablet -reports she has really struggled with weight loss and has been on this a long  time with initial reduction in weight  -has many GI symptoms and depression - but not sure if related to this med or her other issues -discussed risks and that typically advised short term and advised taper off of this -she opted to hold off on this for now as she has been through a lot lately with her thyroid and parathyroid issues and will consider at follow up  [place] -will defer to endocrine for management  HYPERTENSION  ALLERGIC RHINITIS  Gastroesophageal reflux disease, esophagitis presence not specified  Celiac disease  Parathyroid hormone excess -will defer to endocrine for management  Bilateral edema of lower extremity   -We reviewed the PMH, PSH, FH, SH, Meds and Allergies. -We provided refills for any medications we will prescribe as needed. -We addressed current concerns per orders and patient instructions. -We have asked for records for pertinent exams, studies, vaccines and notes from previous providers. -We have advised patient to follow up per instructions below. -follow up for MEDICARE exam  -Patient advised  to return or notify a doctor immediately if symptoms worsen or persist or new concerns arise.  Patient Instructions  -STOP the xanax (use medicine drop box to dispose); use the ATIVAN instead as little as possible for severe anxiety  I advise that you taper off of the phentermine - however we will wait on this per your request until after your medicare physical and your follow up with the endocrinologist:  Taper: -please do 1/2 tablet daily for 1 month -then, 1/4 tablet daily for 1 month -then, every other day for 1 month -then, stop  Follow up with Dr. Cruzita Lederer about your thyroid and parathyroid  Follow up in about 3 months for the Medicare EXAM            Osmond Steckman, [place]

## 2014-04-12 NOTE — Progress Notes (Signed)
Pre visit review using our clinic review tool, if applicable. No additional management support is needed unless otherwise documented below in the visit note. 

## 2014-04-15 ENCOUNTER — Other Ambulatory Visit: Payer: Self-pay | Admitting: Internal Medicine

## 2014-05-01 ENCOUNTER — Other Ambulatory Visit: Payer: Self-pay | Admitting: Internal Medicine

## 2014-05-03 ENCOUNTER — Ambulatory Visit: Payer: Medicare Other | Admitting: Internal Medicine

## 2014-05-13 ENCOUNTER — Ambulatory Visit: Payer: Medicare Other | Admitting: Internal Medicine

## 2014-05-17 ENCOUNTER — Other Ambulatory Visit: Payer: Self-pay | Admitting: Internal Medicine

## 2014-05-31 ENCOUNTER — Other Ambulatory Visit: Payer: Self-pay | Admitting: Internal Medicine

## 2014-06-03 ENCOUNTER — Other Ambulatory Visit (INDEPENDENT_AMBULATORY_CARE_PROVIDER_SITE_OTHER): Payer: Medicare Other

## 2014-06-03 ENCOUNTER — Ambulatory Visit (INDEPENDENT_AMBULATORY_CARE_PROVIDER_SITE_OTHER): Payer: Medicare Other | Admitting: Internal Medicine

## 2014-06-03 ENCOUNTER — Encounter: Payer: Self-pay | Admitting: Internal Medicine

## 2014-06-03 VITALS — BP 120/68 | HR 77 | Temp 98.7°F | Resp 16 | Ht 64.0 in | Wt 209.0 lb

## 2014-06-03 DIAGNOSIS — E039 Hypothyroidism, unspecified: Secondary | ICD-10-CM

## 2014-06-03 DIAGNOSIS — J4520 Mild intermittent asthma, uncomplicated: Secondary | ICD-10-CM | POA: Insufficient documentation

## 2014-06-03 DIAGNOSIS — K900 Celiac disease: Secondary | ICD-10-CM

## 2014-06-03 DIAGNOSIS — K219 Gastro-esophageal reflux disease without esophagitis: Secondary | ICD-10-CM

## 2014-06-03 DIAGNOSIS — Z23 Encounter for immunization: Secondary | ICD-10-CM

## 2014-06-03 DIAGNOSIS — Z860100 Personal history of colon polyps, unspecified: Secondary | ICD-10-CM | POA: Insufficient documentation

## 2014-06-03 DIAGNOSIS — M5430 Sciatica, unspecified side: Secondary | ICD-10-CM

## 2014-06-03 DIAGNOSIS — M48062 Spinal stenosis, lumbar region with neurogenic claudication: Secondary | ICD-10-CM | POA: Insufficient documentation

## 2014-06-03 DIAGNOSIS — M5441 Lumbago with sciatica, right side: Secondary | ICD-10-CM

## 2014-06-03 DIAGNOSIS — I10 Essential (primary) hypertension: Secondary | ICD-10-CM

## 2014-06-03 DIAGNOSIS — Z1231 Encounter for screening mammogram for malignant neoplasm of breast: Secondary | ICD-10-CM | POA: Insufficient documentation

## 2014-06-03 DIAGNOSIS — M5442 Lumbago with sciatica, left side: Secondary | ICD-10-CM

## 2014-06-03 DIAGNOSIS — N184 Chronic kidney disease, stage 4 (severe): Secondary | ICD-10-CM | POA: Insufficient documentation

## 2014-06-03 DIAGNOSIS — I720 Aneurysm of carotid artery: Secondary | ICD-10-CM

## 2014-06-03 DIAGNOSIS — J309 Allergic rhinitis, unspecified: Secondary | ICD-10-CM

## 2014-06-03 DIAGNOSIS — M810 Age-related osteoporosis without current pathological fracture: Secondary | ICD-10-CM

## 2014-06-03 DIAGNOSIS — E669 Obesity, unspecified: Secondary | ICD-10-CM

## 2014-06-03 DIAGNOSIS — E213 Hyperparathyroidism, unspecified: Secondary | ICD-10-CM

## 2014-06-03 LAB — LIPID PANEL
CHOLESTEROL: 208 mg/dL — AB (ref 0–200)
HDL: 55.2 mg/dL (ref >39.00)
LDL Cholesterol: 119 mg/dL — ABNORMAL HIGH (ref 0–99)
NonHDL: 152.8
Total CHOL/HDL Ratio: 4
Triglycerides: 170 mg/dL — ABNORMAL HIGH (ref 0.0–149.0)
VLDL: 34 mg/dL (ref 0.0–40.0)

## 2014-06-03 LAB — CBC WITH DIFFERENTIAL/PLATELET
BASOS PCT: 0.7 % (ref 0.0–3.0)
Basophils Absolute: 0.1 10*3/uL (ref 0.0–0.1)
EOS ABS: 0.4 10*3/uL (ref 0.0–0.7)
EOS PCT: 4.5 % (ref 0.0–5.0)
HCT: 37.4 % (ref 36.0–46.0)
HEMOGLOBIN: 12.8 g/dL (ref 12.0–15.0)
LYMPHS PCT: 26.7 % (ref 12.0–46.0)
Lymphs Abs: 2.2 10*3/uL (ref 0.7–4.0)
MCHC: 34.2 g/dL (ref 30.0–36.0)
MCV: 93.2 fl (ref 78.0–100.0)
Monocytes Absolute: 0.8 10*3/uL (ref 0.1–1.0)
Monocytes Relative: 10 % (ref 3.0–12.0)
NEUTROS ABS: 4.8 10*3/uL (ref 1.4–7.7)
NEUTROS PCT: 58.1 % (ref 43.0–77.0)
Platelets: 333 10*3/uL (ref 150.0–400.0)
RBC: 4.02 Mil/uL (ref 3.87–5.11)
RDW: 14.7 % (ref 11.5–15.5)
WBC: 8.2 10*3/uL (ref 4.0–10.5)

## 2014-06-03 LAB — COMPREHENSIVE METABOLIC PANEL
ALBUMIN: 4.6 g/dL (ref 3.5–5.2)
ALT: 25 U/L (ref 0–35)
AST: 23 U/L (ref 0–37)
Alkaline Phosphatase: 50 U/L (ref 39–117)
BUN: 33 mg/dL — AB (ref 6–23)
CALCIUM: 9.6 mg/dL (ref 8.4–10.5)
CO2: 30 mEq/L (ref 19–32)
CREATININE: 2 mg/dL — AB (ref 0.4–1.2)
Chloride: 101 mEq/L (ref 96–112)
GFR: 26.85 mL/min — ABNORMAL LOW (ref >60.00)
GLUCOSE: 95 mg/dL (ref 70–99)
POTASSIUM: 4 meq/L (ref 3.5–5.1)
Sodium: 140 mEq/L (ref 135–145)
Total Bilirubin: 0.5 mg/dL (ref 0.2–1.2)
Total Protein: 7.8 g/dL (ref 6.0–8.3)

## 2014-06-03 LAB — TSH: TSH: 1.98 u[IU]/mL (ref 0.35–4.50)

## 2014-06-03 MED ORDER — HYDROCODONE-ACETAMINOPHEN 5-325 MG PO TABS: 1.0000 | ORAL_TABLET | Freq: Four times a day (QID) | ORAL | Status: DC | PRN

## 2014-06-03 NOTE — Patient Instructions (Signed)
Spinal Stenosis Spinal stenosis is an abnormal narrowing of the canals of your spine (vertebrae). CAUSES  Spinal stenosis is caused by areas of bone pushing into the central canals of your vertebrae. This condition can be present at birth (congenital). It also may be caused by arthritic deterioration of your vertebrae (spinal degeneration).  SYMPTOMS   Pain that is generally worse with activities, particularly standing and walking.  Numbness, tingling, hot or cold sensations, weakness, or weariness in your legs.  Frequent episodes of falling.  A foot-slapping gait that leads to muscle weakness. DIAGNOSIS  Spinal stenosis is diagnosed with the use of magnetic resonance imaging (MRI) or computed tomography (CT). TREATMENT  Initial therapy for spinal stenosis focuses on the management of the pain and other symptoms associated with the condition. These therapies include:  Practicing postural changes to lessen pressure on your nerves.  Exercises to strengthen the core of your body.  Loss of excess body weight.  The use of nonsteroidal anti-inflammatory medicines to reduce swelling and inflammation in your nerves. When therapies to manage pain are not successful, surgery to treat spinal stenosis may be recommended. This surgery involves removing excess bone, which puts pressure on your nerve roots. During this surgery (laminectomy), the posterior boney arch (lamina) and excess bone around the facet joints are removed. Document Released: 11/24/2003 Document Revised: 01/18/2014 Document Reviewed: 12/12/2012 [employer] Patient Information 2015 [employer]. This information is not intended to replace advice given to you by your health care provider. Make sure you discuss any questions you have with your health care provider.  

## 2014-06-03 NOTE — Assessment & Plan Note (Signed)
Her BP is well controlled Will keep the diuretic dose low to avoid more renal dysfunction

## 2014-06-03 NOTE — Assessment & Plan Note (Signed)
I will recheck her TSH and will adjust her dose of needed

## 2014-06-03 NOTE — Progress Notes (Signed)
Subjective:    Patient ID: Leslie Edwards, female    DOB: 1946/05/17, 68 y.o.   MRN: [id number]  Back Pain This is a chronic problem. The current episode started more than 1 year ago. The problem occurs constantly. The problem has been gradually worsening since onset. The pain is present in the lumbar spine. The quality of the pain is described as aching and shooting. The pain radiates to the right foot and left foot. The pain is at a severity of 6/10. The pain is moderate. The pain is worse during the day. The symptoms are aggravated by standing. Stiffness is present all day. Associated symptoms include weakness (in both legs). Pertinent negatives include no abdominal pain, bladder incontinence, bowel incontinence, chest pain, dysuria, fever, headaches, leg pain, numbness, paresis, paresthesias, pelvic pain, perianal numbness, tingling or weight loss. She has tried home exercises for the symptoms. The treatment provided mild relief.      Review of Systems  Constitutional: Negative for fever, chills, weight loss, diaphoresis, appetite change and fatigue.  HENT: Negative.   Eyes: Negative.   Respiratory: Negative.  Negative for cough, choking, chest tightness, shortness of breath, wheezing and stridor.   Cardiovascular: Negative.  Negative for chest pain, palpitations and leg swelling.  Gastrointestinal: Negative.  Negative for nausea, vomiting, abdominal pain, diarrhea, constipation, blood in stool and bowel incontinence.  Endocrine: Negative.   Genitourinary: Negative.  Negative for bladder incontinence, dysuria and pelvic pain.  Musculoskeletal: Positive for back pain. Negative for arthralgias, gait problem, joint swelling, myalgias, neck pain and neck stiffness.  Skin: Negative.  Negative for rash.  Allergic/Immunologic: Negative.   Neurological: Positive for weakness (in both legs). Negative for dizziness, tingling, tremors, light-headedness, numbness, headaches and paresthesias.    Hematological: Negative.  Negative for adenopathy. Does not bruise/bleed easily.  Psychiatric/Behavioral: Negative.        Objective:   Physical Exam  Vitals reviewed. Constitutional: She is oriented to person, place, and time. She appears well-developed and well-nourished. No distress.  HENT:  Head: Normocephalic and atraumatic.  Mouth/Throat: Oropharynx is clear and moist. No oropharyngeal exudate.  Eyes: Conjunctivae are normal. Right eye exhibits no discharge. Left eye exhibits no discharge. No scleral icterus.  Neck: Normal range of motion. Neck supple. No JVD present. No tracheal deviation present. No thyromegaly present.  Cardiovascular: Normal rate and intact distal pulses.  Exam reveals no gallop and no friction rub.   Murmur heard.  Decrescendo systolic murmur is present with a grade of 1/6   No diastolic murmur is present  1/6 SEM RUSB  Pulmonary/Chest: Effort normal and breath sounds normal. No stridor. She has no wheezes. She has no rales. She exhibits no tenderness.  Abdominal: Soft. Bowel sounds are normal. She exhibits no distension and no mass. There is no tenderness. There is no rebound and no guarding.  Musculoskeletal: Normal range of motion. She exhibits edema (trace edema in BLE). She exhibits no tenderness.  Lymphadenopathy:    She has no cervical adenopathy.  Neurological: She is alert and oriented to person, place, and time. She has normal strength. She displays no atrophy, no tremor and normal reflexes. No cranial nerve deficit or sensory deficit. She exhibits normal muscle tone. She displays a negative Romberg sign. She displays no seizure activity. Coordination and gait normal.  Reflex Scores:      Tricep reflexes are 2+ on the right side and 1+ on the left side.      Bicep reflexes are 2+ on the  right side and 1+ on the left side.      Brachioradialis reflexes are 1+ on the right side and 1+ on the left side.      Patellar reflexes are 0 on the right side  and 0 on the left side.      Achilles reflexes are 0 on the right side and 0 on the left side. Neg SLR in BLE  Skin: Skin is warm and dry. No rash noted. She is not diaphoretic. No erythema. No pallor.  Psychiatric: She has a normal mood and affect. Her behavior is normal. Judgment and thought content normal.     Lab Results  Component Value Date   WBC 6.4 08/31/2013   HGB 13.1 08/31/2013   HCT 38.4 08/31/2013   PLT 330.0 08/31/2013   GLUCOSE 85 07/24/2013   ALT 15 01/28/2009   AST 15 01/28/2009   NA 136 07/24/2013   K 4.3 07/24/2013   CL 101 07/24/2013   CREATININE 1.39* 07/24/2013   BUN 36* 07/24/2013   CO2 26 07/24/2013   TSH 1.34 11/06/2013   HGBA1C 5.3 04/28/2009       Assessment & Plan:

## 2014-06-03 NOTE — Assessment & Plan Note (Signed)
I will recheck her PTH and her Ca++ levels today

## 2014-06-03 NOTE — Assessment & Plan Note (Signed)
Plain films from 2 years ago look bad, I think she has spinal stenosis Will start norco for the pain I have ordered an MRI to see how severe this is

## 2014-06-03 NOTE — Progress Notes (Signed)
Pre visit review using our clinic review tool, if applicable. No additional management support is needed unless otherwise documented below in the visit note. 

## 2014-06-04 ENCOUNTER — Encounter: Payer: Self-pay | Admitting: Internal Medicine

## 2014-06-04 LAB — PTH, INTACT AND CALCIUM
Calcium: 9.6 mg/dL (ref 8.4–10.5)
PTH: 98 pg/mL — ABNORMAL HIGH (ref 14–64)

## 2014-06-07 ENCOUNTER — Ambulatory Visit: Payer: Medicare Other | Admitting: Internal Medicine

## 2014-06-14 ENCOUNTER — Ambulatory Visit
Admission: RE | Admit: 2014-06-14 | Discharge: 2014-06-14 | Disposition: A | Discharge status: Home / Self Care | Payer: Medicare Other | Source: Ambulatory Visit | Attending: Internal Medicine | Admitting: Internal Medicine

## 2014-06-14 DIAGNOSIS — M5442 Lumbago with sciatica, left side: Principal | ICD-10-CM

## 2014-06-14 DIAGNOSIS — M5441 Lumbago with sciatica, right side: Secondary | ICD-10-CM

## 2014-06-15 ENCOUNTER — Encounter: Payer: Self-pay | Admitting: Internal Medicine

## 2014-06-16 ENCOUNTER — Other Ambulatory Visit: Payer: Medicare Other

## 2014-06-21 ENCOUNTER — Ambulatory Visit: Payer: Medicare Other | Admitting: Internal Medicine

## 2014-06-22 ENCOUNTER — Other Ambulatory Visit: Payer: Self-pay | Admitting: Internal Medicine

## 2014-06-24 ENCOUNTER — Other Ambulatory Visit: Payer: Self-pay | Admitting: Internal Medicine

## 2014-06-24 DIAGNOSIS — M48062 Spinal stenosis, lumbar region with neurogenic claudication: Secondary | ICD-10-CM

## 2014-06-28 ENCOUNTER — Ambulatory Visit
Admission: RE | Admit: 2014-06-28 | Discharge: 2014-06-28 | Disposition: A | Discharge status: Home / Self Care | Payer: Medicare Other | Source: Ambulatory Visit | Attending: Internal Medicine | Admitting: Internal Medicine

## 2014-06-28 DIAGNOSIS — Z1231 Encounter for screening mammogram for malignant neoplasm of breast: Secondary | ICD-10-CM

## 2014-06-30 LAB — HM MAMMOGRAPHY: HM MAMMO: ABNORMAL

## 2014-07-01 ENCOUNTER — Encounter: Payer: Self-pay | Admitting: Internal Medicine

## 2014-07-01 ENCOUNTER — Other Ambulatory Visit: Payer: Self-pay | Admitting: Internal Medicine

## 2014-07-01 ENCOUNTER — Ambulatory Visit (INDEPENDENT_AMBULATORY_CARE_PROVIDER_SITE_OTHER)
Admission: RE | Admit: 2014-07-01 | Discharge: 2014-07-01 | Disposition: A | Discharge status: Home / Self Care | Payer: Medicare Other | Source: Ambulatory Visit | Attending: Internal Medicine | Admitting: Internal Medicine

## 2014-07-01 ENCOUNTER — Other Ambulatory Visit: Payer: Self-pay | Admitting: Family Medicine

## 2014-07-01 ENCOUNTER — Ambulatory Visit (INDEPENDENT_AMBULATORY_CARE_PROVIDER_SITE_OTHER): Payer: Medicare Other | Admitting: Internal Medicine

## 2014-07-01 VITALS — BP 120/60 | HR 72 | Temp 98.3°F | Resp 16 | Wt 206.0 lb

## 2014-07-01 DIAGNOSIS — M48062 Spinal stenosis, lumbar region with neurogenic claudication: Secondary | ICD-10-CM

## 2014-07-01 DIAGNOSIS — R05 Cough: Secondary | ICD-10-CM

## 2014-07-01 DIAGNOSIS — J13 Pneumonia due to Streptococcus pneumoniae: Secondary | ICD-10-CM

## 2014-07-01 DIAGNOSIS — R059 Cough, unspecified: Secondary | ICD-10-CM

## 2014-07-01 DIAGNOSIS — M4806 Spinal stenosis, lumbar region: Secondary | ICD-10-CM

## 2014-07-01 DIAGNOSIS — R928 Other abnormal and inconclusive findings on diagnostic imaging of breast: Secondary | ICD-10-CM

## 2014-07-01 MED ORDER — LUBIPROSTONE 8 MCG PO CAPS: 8.0000 ug | ORAL_CAPSULE | Freq: Two times a day (BID) | ORAL | Status: DC

## 2014-07-01 MED ORDER — MOXIFLOXACIN HCL 400 MG PO TABS: 400.0000 mg | ORAL_TABLET | Freq: Every day | ORAL | Status: AC

## 2014-07-01 MED ORDER — MOMETASONE FUROATE 50 MCG/ACT NA SUSP: 2.0000 | Freq: Every day | NASAL | Status: DC

## 2014-07-01 MED ORDER — HYDROCODONE-HOMATROPINE 5-1.5 MG/5ML PO SYRP: 5.0000 mL | ORAL_SOLUTION | Freq: Three times a day (TID) | ORAL | Status: DC | PRN

## 2014-07-01 NOTE — Patient Instructions (Signed)
Cough, Adult  A cough is a reflex that helps clear your throat and airways. It can help heal the body or may be a reaction to an irritated airway. A cough may only last 2 or 3 weeks (acute) or may last more than 8 weeks (chronic).  CAUSES Acute cough:  Viral or bacterial infections. Chronic cough:  Infections.  Allergies.  Asthma.  Post-nasal drip.  Smoking.  Heartburn or acid reflux.  Some medicines.  Chronic lung problems (COPD).  Cancer. SYMPTOMS   Cough.  Fever.  Chest pain.  Increased breathing rate.  High-pitched whistling sound when breathing (wheezing).  Colored mucus that you cough up (sputum). TREATMENT   A bacterial cough may be treated with antibiotic medicine.  A viral cough must run its course and will not respond to antibiotics.  Your caregiver may recommend other treatments if you have a chronic cough. HOME CARE INSTRUCTIONS   Only take over-the-counter or prescription medicines for pain, discomfort, or fever as directed by your caregiver. Use cough suppressants only as directed by your caregiver.  Use a cold steam vaporizer or humidifier in your bedroom or home to help loosen secretions.  Sleep in a semi-upright position if your cough is worse at night.  Rest as needed.  Stop smoking if you smoke. SEEK IMMEDIATE MEDICAL CARE IF:   You have pus in your sputum.  Your cough starts to worsen.  You cannot control your cough with suppressants and are losing sleep.  You begin coughing up blood.  You have difficulty breathing.  You develop pain which is getting worse or is uncontrolled with medicine.  You have a fever. MAKE SURE YOU:   Understand these instructions.  Will watch your condition.  Will get help right away if you are not doing well or get worse. Document Released: 03/02/2011 Document Revised: 11/26/2011 Document Reviewed: 03/02/2011 [employer] Patient Information 2015 [employer]. This information is not intended  to replace advice given to you by your health care provider. Make sure you discuss any questions you have with your health care provider.  

## 2014-07-01 NOTE — Progress Notes (Signed)
Pre visit review using our clinic review tool, if applicable. No additional management support is needed unless otherwise documented below in the visit note. 

## 2014-07-01 NOTE — Progress Notes (Signed)
Subjective:    Patient ID: Leslie Edwards, female    DOB: October 13, 1945, 68 y.o.   MRN: [id number]  Cough This is a new problem. The current episode started in the past 7 days. The problem has been gradually worsening. The problem occurs every few hours. The cough is productive of purulent sputum. Associated symptoms include chills, nasal congestion, rhinorrhea, a sore throat, shortness of breath and wheezing. Pertinent negatives include no chest pain, ear congestion, ear pain, fever, headaches, heartburn, hemoptysis, myalgias, postnasal drip, rash, sweats or weight loss. She has tried a beta-agonist inhaler, steroid inhaler and OTC cough suppressant for the symptoms. The treatment provided mild relief. Her past medical history is significant for asthma, environmental allergies and pneumonia. There is no history of bronchiectasis, bronchitis, COPD or emphysema.      Review of Systems  Constitutional: Positive for chills. Negative for fever, weight loss, diaphoresis, appetite change, fatigue and unexpected weight change.  HENT: Positive for rhinorrhea, sinus pressure and sore throat. Negative for ear pain, postnasal drip, trouble swallowing and voice change.   Eyes: Negative.   Respiratory: Positive for cough, shortness of breath and wheezing. Negative for apnea, hemoptysis, choking, chest tightness and stridor.   Cardiovascular: Negative.  Negative for chest pain, palpitations and leg swelling.  Gastrointestinal: Negative.  Negative for heartburn, nausea, vomiting, abdominal pain, diarrhea, constipation and blood in stool.  Endocrine: Negative.   Genitourinary: Negative.   Musculoskeletal: Positive for back pain. Negative for arthralgias, gait problem, joint swelling, myalgias, neck pain and neck stiffness.  Skin: Negative.  Negative for rash.  Allergic/Immunologic: Positive for environmental allergies.  Neurological: Negative for headaches.  Hematological: Negative.  Negative for adenopathy.  Does not bruise/bleed easily.  Psychiatric/Behavioral: Negative.        Objective:   Physical Exam  Vitals reviewed. Constitutional: She is oriented to person, place, and time. She appears well-developed and well-nourished.  Non-toxic appearance. She does not have a sickly appearance. She does not appear ill. No distress.  HENT:  Head: Normocephalic and atraumatic.  Mouth/Throat: Oropharynx is clear and moist. No oropharyngeal exudate.  Eyes: Conjunctivae are normal. Right eye exhibits no discharge. Left eye exhibits no discharge. No scleral icterus.  Neck: Normal range of motion. Neck supple. No JVD present. No tracheal deviation present. No thyromegaly present.  Cardiovascular: Normal rate, regular rhythm, normal heart sounds and intact distal pulses.  Exam reveals no gallop and no friction rub.   No murmur heard. Pulmonary/Chest: Effort normal and breath sounds normal. No accessory muscle usage or stridor. Not tachypneic. No respiratory distress. She has no decreased breath sounds. She has no wheezes. She has no rhonchi. She has no rales. She exhibits no tenderness.  Abdominal: Soft. Bowel sounds are normal. She exhibits no distension and no mass. There is no tenderness. There is no rebound and no guarding.  Musculoskeletal: Normal range of motion. She exhibits no edema and no tenderness.  Lymphadenopathy:    She has no cervical adenopathy.  Neurological: She is oriented to person, place, and time.  Skin: Skin is warm and dry. No rash noted. She is not diaphoretic. No erythema. No pallor.    Lab Results  Component Value Date   WBC 8.2 06/03/2014   HGB 12.8 06/03/2014   HCT 37.4 06/03/2014   PLT 333.0 06/03/2014   GLUCOSE 95 06/03/2014   CHOL 208* 06/03/2014   TRIG 170.0* 06/03/2014   HDL 55.20 06/03/2014   LDLCALC 119* 06/03/2014   ALT 25 06/03/2014  AST 23 06/03/2014   NA 140 06/03/2014   K 4.0 06/03/2014   CL 101 06/03/2014   CREATININE 2.0* 06/03/2014   BUN 33* 06/03/2014   CO2 30  06/03/2014   TSH 1.98 06/03/2014   HGBA1C 5.3 04/28/2009        Assessment & Plan:

## 2014-07-02 NOTE — Assessment & Plan Note (Signed)
Her CXR is normal.

## 2014-07-02 NOTE — Assessment & Plan Note (Signed)
Will treat the infection with avelox Will control the cough with hycodan

## 2014-07-02 NOTE — Assessment & Plan Note (Signed)
She will see pain management Will cont norco for pain

## 2014-07-07 ENCOUNTER — Encounter: Payer: Self-pay | Admitting: Internal Medicine

## 2014-07-08 ENCOUNTER — Other Ambulatory Visit: Payer: Self-pay | Admitting: Internal Medicine

## 2014-07-08 MED ORDER — METHYLPREDNISOLONE 4 MG PO KIT: PACK | ORAL | Status: DC

## 2014-07-15 ENCOUNTER — Other Ambulatory Visit: Payer: Self-pay | Admitting: Internal Medicine

## 2014-07-15 ENCOUNTER — Ambulatory Visit
Admission: RE | Admit: 2014-07-15 | Discharge: 2014-07-15 | Disposition: A | Discharge status: Home / Self Care | Payer: Medicare Other | Source: Ambulatory Visit | Attending: Internal Medicine | Admitting: Internal Medicine

## 2014-07-15 ENCOUNTER — Encounter: Payer: Medicare Other | Admitting: Family Medicine

## 2014-07-15 DIAGNOSIS — R928 Other abnormal and inconclusive findings on diagnostic imaging of breast: Secondary | ICD-10-CM

## 2014-07-19 ENCOUNTER — Ambulatory Visit: Payer: Medicare Other | Admitting: Internal Medicine

## 2014-07-22 ENCOUNTER — Ambulatory Visit (INDEPENDENT_AMBULATORY_CARE_PROVIDER_SITE_OTHER): Payer: Medicare Other | Admitting: Internal Medicine

## 2014-07-22 ENCOUNTER — Encounter: Payer: Self-pay | Admitting: Internal Medicine

## 2014-07-22 VITALS — BP 140/80 | HR 79 | Temp 98.3°F | Resp 16 | Ht 64.0 in | Wt 199.0 lb

## 2014-07-22 DIAGNOSIS — K2100 Gastro-esophageal reflux disease with esophagitis, without bleeding: Secondary | ICD-10-CM

## 2014-07-22 DIAGNOSIS — F411 Generalized anxiety disorder: Secondary | ICD-10-CM

## 2014-07-22 DIAGNOSIS — M5442 Lumbago with sciatica, left side: Secondary | ICD-10-CM

## 2014-07-22 DIAGNOSIS — F418 Other specified anxiety disorders: Secondary | ICD-10-CM | POA: Insufficient documentation

## 2014-07-22 DIAGNOSIS — M48062 Spinal stenosis, lumbar region with neurogenic claudication: Secondary | ICD-10-CM

## 2014-07-22 DIAGNOSIS — M5441 Lumbago with sciatica, right side: Secondary | ICD-10-CM

## 2014-07-22 DIAGNOSIS — M4806 Spinal stenosis, lumbar region: Secondary | ICD-10-CM

## 2014-07-22 MED ORDER — ALPRAZOLAM 0.25 MG PO TABS: 0.2500 mg | ORAL_TABLET | Freq: Two times a day (BID) | ORAL | Status: DC | PRN

## 2014-07-22 MED ORDER — HYDROCODONE-ACETAMINOPHEN 5-325 MG PO TABS: 1.0000 | ORAL_TABLET | Freq: Four times a day (QID) | ORAL | Status: DC | PRN

## 2014-07-22 MED ORDER — HYOSCYAMINE SULFATE 0.125 MG SL SUBL: 0.1250 mg | SUBLINGUAL_TABLET | SUBLINGUAL | Status: DC | PRN

## 2014-07-22 NOTE — Progress Notes (Signed)
Pre visit review using our clinic review tool, if applicable. No additional management support is needed unless otherwise documented below in the visit note. 

## 2014-07-22 NOTE — Patient Instructions (Signed)
Gastroesophageal Reflux Disease, Adult Gastroesophageal reflux disease (GERD) happens when acid from your stomach flows up into the esophagus. When acid comes in contact with the esophagus, the acid causes soreness (inflammation) in the esophagus. Over time, GERD may create small holes (ulcers) in the lining of the esophagus. CAUSES   Increased body weight. This puts pressure on the stomach, making acid rise from the stomach into the esophagus.  Smoking. This increases acid production in the stomach.  Drinking alcohol. This causes decreased pressure in the lower esophageal sphincter (valve or ring of muscle between the esophagus and stomach), allowing acid from the stomach into the esophagus.  Late evening meals and a full stomach. This increases pressure and acid production in the stomach.  A malformed lower esophageal sphincter. Sometimes, no cause is found. SYMPTOMS   Burning pain in the lower part of the mid-chest behind the breastbone and in the mid-stomach area. This may occur twice a week or more often.  Trouble swallowing.  Sore throat.  Dry cough.  Asthma-like symptoms including chest tightness, shortness of breath, or wheezing. DIAGNOSIS  Your caregiver may be able to diagnose GERD based on your symptoms. In some cases, X-rays and other tests may be done to check for complications or to check the condition of your stomach and esophagus. TREATMENT  Your caregiver may recommend over-the-counter or prescription medicines to help decrease acid production. Ask your caregiver before starting or adding any new medicines.  HOME CARE INSTRUCTIONS   Change the factors that you can control. Ask your caregiver for guidance concerning weight loss, quitting smoking, and alcohol consumption.  Avoid foods and drinks that make your symptoms worse, such as:  Caffeine or alcoholic drinks.  Chocolate.  Peppermint or mint flavorings.  Garlic and onions.  Spicy foods.  Citrus fruits,  such as oranges, lemons, or limes.  Tomato-based foods such as sauce, chili, salsa, and pizza.  Fried and fatty foods.  Avoid lying down for the 3 hours prior to your bedtime or prior to taking a nap.  Eat small, frequent meals instead of large meals.  Wear loose-fitting clothing. Do not wear anything tight around your waist that causes pressure on your stomach.  Raise the head of your bed 6 to 8 inches with wood blocks to help you sleep. Extra pillows will not help.  Only take over-the-counter or prescription medicines for pain, discomfort, or fever as directed by your caregiver.  Do not take aspirin, ibuprofen, or other nonsteroidal anti-inflammatory drugs (NSAIDs). SEEK IMMEDIATE MEDICAL CARE IF:   You have pain in your arms, neck, jaw, teeth, or back.  Your pain increases or changes in intensity or duration.  You develop nausea, vomiting, or sweating (diaphoresis).  You develop shortness of breath, or you faint.  Your vomit is green, yellow, black, or looks like coffee grounds or blood.  Your stool is red, bloody, or black. These symptoms could be signs of other problems, such as heart disease, gastric bleeding, or esophageal bleeding. MAKE SURE YOU:   Understand these instructions.  Will watch your condition.  Will get help right away if you are not doing well or get worse. Document Released: 06/13/2005 Document Revised: 11/26/2011 Document Reviewed: 03/23/2011 [employer] Patient Information 2015 [employer]. This information is not intended to replace advice given to you by your health care provider. Make sure you discuss any questions you have with your health care provider.  

## 2014-07-25 NOTE — Assessment & Plan Note (Signed)
Norco has helped with the pain She see Dr. Nelva Bush soon for further evaluation

## 2014-07-25 NOTE — Progress Notes (Signed)
   Subjective:    Patient ID: Leslie Edwards, female    DOB: [birth year], 68 y.o.   MRN: [id number]  Back Pain This is a chronic problem. The current episode started more than 1 year ago. The problem occurs intermittently. The problem is unchanged. The pain is present in the lumbar spine. The quality of the pain is described as aching. The pain radiates to the left thigh and right thigh. The pain is at a severity of 6/10. The pain is moderate. The pain is worse during the day. The symptoms are aggravated by bending, position and standing. Pertinent negatives include no abdominal pain, bladder incontinence, bowel incontinence, chest pain, dysuria, fever, headaches, leg pain, numbness, paresis, paresthesias, pelvic pain, perianal numbness, tingling, weakness or weight loss. She has tried analgesics for the symptoms. The treatment provided moderate relief.      Review of Systems  Constitutional: Negative.  Negative for fever, chills, weight loss, diaphoresis, appetite change and fatigue.  HENT: Negative.   Eyes: Negative.   Respiratory: Negative.  Negative for cough, choking, chest tightness, shortness of breath and stridor.   Cardiovascular: Negative.  Negative for chest pain, palpitations and leg swelling.  Gastrointestinal: Negative.  Negative for nausea, abdominal pain, diarrhea, blood in stool and bowel incontinence.  Endocrine: Negative.   Genitourinary: Negative.  Negative for bladder incontinence, dysuria and pelvic pain.  Musculoskeletal: Positive for back pain. Negative for myalgias, joint swelling, arthralgias, gait problem, neck pain and neck stiffness.  Skin: Negative.  Negative for rash.  Allergic/Immunologic: Negative.   Neurological: Negative.  Negative for tingling, weakness, numbness, headaches and paresthesias.  Hematological: Negative.  Negative for adenopathy. Does not bruise/bleed easily.  Psychiatric/Behavioral: Negative.        Objective:   Physical Exam    Constitutional: She is oriented to person, place, and time. She appears well-developed and well-nourished. No distress.  HENT:  Head: Normocephalic and atraumatic.  Mouth/Throat: Oropharynx is clear and moist. No oropharyngeal exudate.  Eyes: Conjunctivae are normal. Right eye exhibits no discharge. Left eye exhibits no discharge. No scleral icterus.  Neck: Normal range of motion. Neck supple. No JVD present. No tracheal deviation present. No thyromegaly present.  Cardiovascular: Normal rate, regular rhythm, normal heart sounds and intact distal pulses.  Exam reveals no gallop and no friction rub.   No murmur heard. Pulmonary/Chest: Effort normal and breath sounds normal. No stridor. No respiratory distress. She has no wheezes. She has no rales. She exhibits no tenderness.  Abdominal: Soft. Bowel sounds are normal. She exhibits no distension and no mass. There is no tenderness. There is no rebound and no guarding.  Musculoskeletal: Normal range of motion. She exhibits no edema or tenderness.  Lymphadenopathy:    She has no cervical adenopathy.  Neurological: She is oriented to person, place, and time.  Skin: Skin is warm and dry. No rash noted. She is not diaphoretic. No erythema. No pallor.  Psychiatric: She has a normal mood and affect. Her behavior is normal. Judgment and thought content normal.  Vitals reviewed.         Assessment & Plan:

## 2014-07-29 ENCOUNTER — Ambulatory Visit
Admission: RE | Admit: 2014-07-29 | Discharge: 2014-07-29 | Disposition: A | Discharge status: Home / Self Care | Payer: Medicare Other | Source: Ambulatory Visit | Attending: Internal Medicine | Admitting: Internal Medicine

## 2014-07-29 DIAGNOSIS — R928 Other abnormal and inconclusive findings on diagnostic imaging of breast: Secondary | ICD-10-CM

## 2014-07-30 ENCOUNTER — Encounter: Payer: Self-pay | Admitting: Internal Medicine

## 2014-08-23 ENCOUNTER — Ambulatory Visit: Payer: Medicare Other | Admitting: Internal Medicine

## 2014-08-26 ENCOUNTER — Encounter: Payer: Self-pay | Admitting: Internal Medicine

## 2014-10-18 ENCOUNTER — Ambulatory Visit: Payer: Medicare Other | Admitting: Internal Medicine

## 2014-10-28 ENCOUNTER — Other Ambulatory Visit: Payer: Self-pay | Admitting: Internal Medicine

## 2014-10-29 ENCOUNTER — Other Ambulatory Visit: Payer: Self-pay

## 2014-10-29 MED ORDER — PHENTERMINE HCL 37.5 MG PO TABS: ORAL_TABLET | ORAL | Status: DC

## 2014-11-16 ENCOUNTER — Ambulatory Visit (INDEPENDENT_AMBULATORY_CARE_PROVIDER_SITE_OTHER): Payer: Medicare Other | Admitting: Internal Medicine

## 2014-11-16 ENCOUNTER — Encounter: Payer: Self-pay | Admitting: Internal Medicine

## 2014-11-16 VITALS — BP 142/82 | HR 86 | Temp 98.4°F | Ht 64.0 in | Wt 211.0 lb

## 2014-11-16 DIAGNOSIS — J0100 Acute maxillary sinusitis, unspecified: Secondary | ICD-10-CM

## 2014-11-16 DIAGNOSIS — J209 Acute bronchitis, unspecified: Secondary | ICD-10-CM

## 2014-11-16 MED ORDER — HYDROCODONE-HOMATROPINE 5-1.5 MG/5ML PO SYRP: 5.0000 mL | ORAL_SOLUTION | Freq: Four times a day (QID) | ORAL | Status: DC | PRN

## 2014-11-16 MED ORDER — SULFAMETHOXAZOLE-TRIMETHOPRIM 800-160 MG PO TABS: 1.0000 | ORAL_TABLET | Freq: Two times a day (BID) | ORAL | Status: DC

## 2014-11-16 NOTE — Progress Notes (Signed)
Pre visit review using our clinic review tool, if applicable. No additional management support is needed unless otherwise documented below in the visit note. 

## 2014-11-16 NOTE — Patient Instructions (Signed)

## 2014-11-16 NOTE — Progress Notes (Signed)
   Subjective:    Patient ID: Leslie Edwards, female    DOB: [birth year], 69 y.o.   MRN: [id number]  HPI   Her symptoms began 11/11/14 as rhinitis with clear drainage and nonproductive cough. Subsequently she's developed a croupy cough productive of clear-yellow sputum. She's had associated chills and sweats. She now describes pain in the maxillary sinus area and nasal secretions with clear/yellow/green color. She's had some aching in the right ear without discharge. The cough has been associated with some shortness of breath and wheezing. She's been using her inhalers.  She does have history of asthma. She is a nonsmoker. She had the flu shot.  Review of Systems Epistaxis has been intermittent & not sustained. She denies frontal headache, fever, or dental pain. Extrinsic symptoms are mild.   Objective:   Physical Exam  General appearance:Adequately nourished; no acute distress or increased work of breathing is present.  No  lymphadenopathy about the head, neck, or axilla noted.   Eyes: No conjunctival inflammation or lid edema is present. There is no scleral icterus.  Ears:  External ear exam shows no significant lesions or deformities.  Otoscopic examination reveals wax bilaterally  Nose:  External nasal examination shows no deformity or inflammation. Nasal mucosa are very eryhthematous ; R septum > L  without lesions or exudates. No septal dislocation or deviation.No obstruction to airflow.   Oral exam: Dental hygiene is good; lips and gums are healthy appearing.There is no oropharyngeal erythema or exudate noted.   Neck:  No deformities, thyromegaly, masses, or tenderness noted.   Supple with full range of motion without pain.   Heart:  Normal rate and regular rhythm. S1 and S2 normal without gallop,  click, rub or other extra sounds. Grade 1/6 systolic murmur  Lungs:Decreased breath sounds.Chest clear to auscultation; no wheezes, rhonchi,rales ,or rubs present.  Extremities:  No  cyanosis, edema, or clubbing  noted    Skin: Warm & damp w/o jaundice or tenting.        Assessment & Plan:  #1 maxillary rhinosinusitis with bronchitis vs rhinitis induced cough  Plan: Nasal hygiene interventions discussed. See prescription medications

## 2014-11-22 ENCOUNTER — Encounter: Payer: Self-pay | Admitting: Internal Medicine

## 2014-11-22 ENCOUNTER — Ambulatory Visit (INDEPENDENT_AMBULATORY_CARE_PROVIDER_SITE_OTHER): Payer: Medicare Other | Admitting: Internal Medicine

## 2014-11-22 VITALS — BP 122/76 | HR 88 | Temp 98.0°F | Resp 16 | Ht 64.0 in | Wt 207.0 lb

## 2014-11-22 DIAGNOSIS — J45901 Unspecified asthma with (acute) exacerbation: Secondary | ICD-10-CM | POA: Insufficient documentation

## 2014-11-22 DIAGNOSIS — I10 Essential (primary) hypertension: Secondary | ICD-10-CM

## 2014-11-22 DIAGNOSIS — J209 Acute bronchitis, unspecified: Secondary | ICD-10-CM

## 2014-11-22 DIAGNOSIS — E039 Hypothyroidism, unspecified: Secondary | ICD-10-CM

## 2014-11-22 DIAGNOSIS — J4541 Moderate persistent asthma with (acute) exacerbation: Secondary | ICD-10-CM

## 2014-11-22 MED ORDER — METHYLPREDNISOLONE ACETATE 80 MG/ML IJ SUSP
120.0000 mg | Freq: Once | INTRAMUSCULAR | Status: AC
  Administered 2014-11-22: 120 mg via INTRAMUSCULAR

## 2014-11-22 MED ORDER — HYDROCODONE-HOMATROPINE 5-1.5 MG/5ML PO SYRP: 5.0000 mL | ORAL_SOLUTION | Freq: Four times a day (QID) | ORAL | Status: DC | PRN

## 2014-11-22 NOTE — Patient Instructions (Signed)
Cough, Adult  A cough is a reflex that helps clear your throat and airways. It can help heal the body or may be a reaction to an irritated airway. A cough may only last 2 or 3 weeks (acute) or may last more than 8 weeks (chronic).  CAUSES Acute cough:  Viral or bacterial infections. Chronic cough:  Infections.  Allergies.  Asthma.  Post-nasal drip.  Smoking.  Heartburn or acid reflux.  Some medicines.  Chronic lung problems (COPD).  Cancer. SYMPTOMS   Cough.  Fever.  Chest pain.  Increased breathing rate.  High-pitched whistling sound when breathing (wheezing).  Colored mucus that you cough up (sputum). TREATMENT   A bacterial cough may be treated with antibiotic medicine.  A viral cough must run its course and will not respond to antibiotics.  Your caregiver may recommend other treatments if you have a chronic cough. HOME CARE INSTRUCTIONS   Only take over-the-counter or prescription medicines for pain, discomfort, or fever as directed by your caregiver. Use cough suppressants only as directed by your caregiver.  Use a cold steam vaporizer or humidifier in your bedroom or home to help loosen secretions.  Sleep in a semi-upright position if your cough is worse at night.  Rest as needed.  Stop smoking if you smoke. SEEK IMMEDIATE MEDICAL CARE IF:   You have pus in your sputum.  Your cough starts to worsen.  You cannot control your cough with suppressants and are losing sleep.  You begin coughing up blood.  You have difficulty breathing.  You develop pain which is getting worse or is uncontrolled with medicine.  You have a fever. MAKE SURE YOU:   Understand these instructions.  Will watch your condition.  Will get help right away if you are not doing well or get worse. Document Released: 03/02/2011 Document Revised: 11/26/2011 Document Reviewed: 03/02/2011 [employer] Patient Information 2015 [employer]. This information is not intended  to replace advice given to you by your health care provider. Make sure you discuss any questions you have with your health care provider.  

## 2014-11-22 NOTE — Progress Notes (Signed)
Subjective:    Patient ID: Leslie Edwards, female    DOB: Aug 31, 1946, 69 y.o.   MRN: [id number]  Cough This is a recurrent problem. The current episode started 1 to 4 weeks ago. The problem has been gradually improving. The problem occurs constantly. The cough is productive of sputum. Associated symptoms include chills, nasal congestion, postnasal drip, a sore throat and wheezing. Pertinent negatives include no chest pain, ear congestion, ear pain, fever, headaches, heartburn, hemoptysis, myalgias, rash, rhinorrhea, shortness of breath, sweats or weight loss. She has tried steroid inhaler, prescription cough suppressant and a beta-agonist inhaler for the symptoms. The treatment provided moderate relief. Her past medical history is significant for asthma, bronchitis and environmental allergies. There is no history of bronchiectasis, COPD, emphysema or pneumonia.      Review of Systems  Constitutional: Positive for chills. Negative for fever, weight loss, diaphoresis, appetite change and fatigue.  HENT: Positive for postnasal drip and sore throat. Negative for ear pain, nosebleeds, rhinorrhea, sinus pressure and sneezing.   Eyes: Negative.   Respiratory: Positive for cough and wheezing. Negative for hemoptysis and shortness of breath.   Cardiovascular: Negative.  Negative for chest pain, palpitations and leg swelling.  Gastrointestinal: Negative.  Negative for heartburn and abdominal pain.  Endocrine: Negative.   Genitourinary: Negative.   Musculoskeletal: Negative.  Negative for myalgias, back pain, arthralgias and neck pain.  Skin: Negative.  Negative for rash.  Allergic/Immunologic: Positive for environmental allergies.  Neurological: Negative.  Negative for headaches.  Hematological: Negative.  Negative for adenopathy. Does not bruise/bleed easily.  Psychiatric/Behavioral: Negative.        Objective:   Physical Exam  Constitutional: She is oriented to person, place, and time. She  appears well-developed and well-nourished.  Non-toxic appearance. She does not have a sickly appearance. She does not appear ill. No distress.  HENT:  Head: Normocephalic and atraumatic.  Mouth/Throat: Oropharynx is clear and moist. No oropharyngeal exudate.  Eyes: Conjunctivae are normal. Right eye exhibits no discharge. Left eye exhibits no discharge. No scleral icterus.  Neck: Normal range of motion. Neck supple. No JVD present. No tracheal deviation present. No thyromegaly present.  Cardiovascular: Normal rate, regular rhythm, normal heart sounds and intact distal pulses.  Exam reveals no gallop and no friction rub.   No murmur heard. Pulmonary/Chest: Effort normal and breath sounds normal. No accessory muscle usage or stridor. No respiratory distress. She has no decreased breath sounds. She has no wheezes. She has no rhonchi. She has no rales. She exhibits no tenderness.  Abdominal: Soft. Bowel sounds are normal. She exhibits no distension and no mass. There is no tenderness. There is no rebound and no guarding.  Musculoskeletal: Normal range of motion. She exhibits no edema or tenderness.  Lymphadenopathy:    She has no cervical adenopathy.  Neurological: She is oriented to person, place, and time.  Skin: Skin is warm and dry. No rash noted. She is not diaphoretic. No erythema. No pallor.  Vitals reviewed.     Lab Results  Component Value Date   WBC 8.2 06/03/2014   HGB 12.8 06/03/2014   HCT 37.4 06/03/2014   PLT 333.0 06/03/2014   GLUCOSE 95 06/03/2014   CHOL 208* 06/03/2014   TRIG 170.0* 06/03/2014   HDL 55.20 06/03/2014   LDLCALC 119* 06/03/2014   ALT 25 06/03/2014   AST 23 06/03/2014   NA 140 06/03/2014   K 4.0 06/03/2014   CL 101 06/03/2014   CREATININE 2.0* 06/03/2014  BUN 33* 06/03/2014   CO2 30 06/03/2014   TSH 1.98 06/03/2014   HGBA1C 5.3 04/28/2009      Assessment & Plan:

## 2014-11-22 NOTE — Assessment & Plan Note (Signed)
She is having a mild flare up of the asthma, I ave her an injection of depo-medrol IM to help with the wheezing and other symptoms She will cont using the cough suppressant and the current inhalers

## 2014-11-23 ENCOUNTER — Telehealth: Payer: Self-pay | Admitting: Internal Medicine

## 2014-11-23 NOTE — Telephone Encounter (Signed)
emmi eamiled

## 2014-11-30 ENCOUNTER — Other Ambulatory Visit: Payer: Self-pay | Admitting: Family Medicine

## 2014-11-30 NOTE — Telephone Encounter (Signed)
Dr Kim-is this OK to refill as Dr Ronnald Ramp is listed as PCP?

## 2014-11-30 NOTE — Telephone Encounter (Signed)
Looks like transferred care. Can you forward to his him/his assistance? Thanks.

## 2014-12-23 ENCOUNTER — Telehealth: Payer: Self-pay

## 2014-12-23 ENCOUNTER — Encounter: Payer: Self-pay | Admitting: Internal Medicine

## 2014-12-23 ENCOUNTER — Ambulatory Visit (INDEPENDENT_AMBULATORY_CARE_PROVIDER_SITE_OTHER): Payer: Medicare Other | Admitting: Internal Medicine

## 2014-12-23 ENCOUNTER — Other Ambulatory Visit (INDEPENDENT_AMBULATORY_CARE_PROVIDER_SITE_OTHER): Payer: Medicare Other

## 2014-12-23 VITALS — BP 118/78 | HR 78 | Temp 98.2°F | Resp 16 | Ht 64.0 in | Wt 209.8 lb

## 2014-12-23 DIAGNOSIS — N184 Chronic kidney disease, stage 4 (severe): Secondary | ICD-10-CM

## 2014-12-23 DIAGNOSIS — E038 Other specified hypothyroidism: Secondary | ICD-10-CM

## 2014-12-23 DIAGNOSIS — I10 Essential (primary) hypertension: Secondary | ICD-10-CM | POA: Diagnosis not present

## 2014-12-23 DIAGNOSIS — R011 Cardiac murmur, unspecified: Secondary | ICD-10-CM

## 2014-12-23 LAB — TSH: TSH: 1.39 u[IU]/mL (ref 0.35–4.50)

## 2014-12-23 LAB — COMPREHENSIVE METABOLIC PANEL
ALT: 22 U/L (ref 0–35)
AST: 19 U/L (ref 0–37)
Albumin: 4.6 g/dL (ref 3.5–5.2)
Alkaline Phosphatase: 52 U/L (ref 39–117)
BILIRUBIN TOTAL: 0.4 mg/dL (ref 0.2–1.2)
BUN: 26 mg/dL — ABNORMAL HIGH (ref 6–23)
CALCIUM: 10 mg/dL (ref 8.4–10.5)
CO2: 29 meq/L (ref 19–32)
CREATININE: 1.58 mg/dL — AB (ref 0.40–1.20)
Chloride: 104 mEq/L (ref 96–112)
GFR: 34.37 mL/min — AB (ref >60.00)
Glucose, Bld: 86 mg/dL (ref 70–99)
Potassium: 4.1 mEq/L (ref 3.5–5.1)
Sodium: 139 mEq/L (ref 135–145)
Total Protein: 7.3 g/dL (ref 6.0–8.3)

## 2014-12-23 MED ORDER — FUROSEMIDE 20 MG PO TABS: 20.0000 mg | ORAL_TABLET | Freq: Two times a day (BID) | ORAL | Status: DC

## 2014-12-23 NOTE — Patient Instructions (Signed)
Hypothyroidism The thyroid is a large gland located in the lower front of your neck. The thyroid gland helps control metabolism. Metabolism is how your body handles food. It controls metabolism with the hormone thyroxine. When this gland is underactive (hypothyroid), it produces too little hormone.  CAUSES These include:   Absence or destruction of thyroid tissue.  Goiter due to iodine deficiency.  Goiter due to medications.  Congenital defects (since birth).  Problems with the pituitary. This causes a lack of TSH (thyroid stimulating hormone). This hormone tells the thyroid to turn out more hormone. SYMPTOMS  Lethargy (feeling as though you have no energy)  Cold intolerance  Weight gain (in spite of normal food intake)  Dry skin  Coarse hair  Menstrual irregularity (if severe, may lead to infertility)  Slowing of thought processes Cardiac problems are also caused by insufficient amounts of thyroid hormone. Hypothyroidism in the newborn is cretinism, and is an extreme form. It is important that this form be treated adequately and immediately or it will lead rapidly to retarded physical and mental development. DIAGNOSIS  To prove hypothyroidism, your caregiver may do blood tests and ultrasound tests. Sometimes the signs are hidden. It may be necessary for your caregiver to watch this illness with blood tests either before or after diagnosis and treatment. TREATMENT  Low levels of thyroid hormone are increased by using synthetic thyroid hormone. This is a safe, effective treatment. It usually takes about four weeks to gain the full effects of the medication. After you have the full effect of the medication, it will generally take another four weeks for problems to leave. Your caregiver may start you on low doses. If you have had heart problems the dose may be gradually increased. It is generally not an emergency to get rapidly to normal. HOME CARE INSTRUCTIONS   Take your  medications as your caregiver suggests. Let your caregiver know of any medications you are taking or start taking. Your caregiver will help you with dosage schedules.  As your condition improves, your dosage needs may increase. It will be necessary to have continuing blood tests as suggested by your caregiver.  Report all suspected medication side effects to your caregiver. SEEK MEDICAL CARE IF: Seek medical care if you develop:  Sweating.  Tremulousness (tremors).  Anxiety.  Rapid weight loss.  Heat intolerance.  Emotional swings.  Diarrhea.  Weakness. SEEK IMMEDIATE MEDICAL CARE IF:  You develop chest pain, an irregular heart beat (palpitations), or a rapid heart beat. MAKE SURE YOU:   Understand these instructions.  Will watch your condition.  Will get help right away if you are not doing well or get worse. Document Released: 09/03/2005 Document Revised: 11/26/2011 Document Reviewed: 04/23/2008 ExitCare Patient Information 2015 [employer]. This information is not intended to replace advice given to you by your health care provider. Make sure you discuss any questions you have with your health care provider.  

## 2014-12-23 NOTE — Progress Notes (Signed)
Pre visit review using our clinic review tool, if applicable. No additional management support is needed unless otherwise documented below in the visit note. 

## 2014-12-23 NOTE — Progress Notes (Signed)
Subjective:    Patient ID: BANI UM, female    DOB: 1945-10-27, 69 y.o.   MRN: [id number]  HPI Comments: Her insurance will no longer pay for demadex, needs to be changed, she takes this for HTN with intermittent edema.  Hypertension This is a chronic problem. The problem is unchanged. The problem is controlled. Associated symptoms include anxiety. Pertinent negatives include no blurred vision, chest pain, headaches, malaise/fatigue, neck pain, orthopnea, palpitations, peripheral edema, PND, shortness of breath or sweats. Agents associated with hypertension include anorectics. Risk factors for coronary artery disease include obesity. Past treatments include diuretics. The current treatment provides significant improvement. Compliance problems include exercise and diet.  Hypertensive end-organ damage includes kidney disease. Identifiable causes of hypertension include chronic renal disease.      Review of Systems  Constitutional: Positive for unexpected weight change. Negative for fever, chills, malaise/fatigue, diaphoresis, appetite change and fatigue.  HENT: Negative.   Eyes: Negative.  Negative for blurred vision.  Respiratory: Negative.  Negative for cough, choking, chest tightness, shortness of breath and stridor.   Cardiovascular: Positive for leg swelling. Negative for chest pain, palpitations, orthopnea and PND.  Gastrointestinal: Negative.  Negative for nausea, abdominal pain, diarrhea, constipation and blood in stool.  Endocrine: Negative.   Genitourinary: Negative.   Musculoskeletal: Negative.  Negative for back pain, arthralgias and neck pain.  Skin: Negative.   Allergic/Immunologic: Negative.   Neurological: Negative.  Negative for dizziness, tremors, seizures, syncope, light-headedness and headaches.  Hematological: Negative.  Negative for adenopathy. Does not bruise/bleed easily.  Psychiatric/Behavioral: Negative.        Objective:   Physical Exam    Constitutional: She is oriented to person, place, and time. She appears well-developed and well-nourished. No distress.  HENT:  Head: Normocephalic and atraumatic.  Mouth/Throat: Oropharynx is clear and moist. No oropharyngeal exudate.  Eyes: Conjunctivae are normal. Right eye exhibits no discharge. Left eye exhibits no discharge. No scleral icterus.  Neck: Normal range of motion. Neck supple. No JVD present. No tracheal deviation present. No thyromegaly present.  Cardiovascular: Normal rate, regular rhythm, S1 normal, S2 normal and intact distal pulses.  Exam reveals no gallop and no friction rub.   Murmur heard.  Decrescendo systolic murmur is present with a grade of 1/6   No diastolic murmur is present  Pulses:      Carotid pulses are 1+ on the right side, and 1+ on the left side.      Radial pulses are 1+ on the right side, and 1+ on the left side.       Femoral pulses are 1+ on the right side, and 1+ on the left side.      Popliteal pulses are 1+ on the right side, and 1+ on the left side.       Dorsalis pedis pulses are 1+ on the right side, and 1+ on the left side.       Posterior tibial pulses are 1+ on the right side, and 1+ on the left side.  1/6 SEM RUSB  Pulmonary/Chest: Effort normal and breath sounds normal. No stridor. No respiratory distress. She has no wheezes. She has no rales. She exhibits no tenderness.  Abdominal: Soft. Bowel sounds are normal. She exhibits no distension and no mass. There is no tenderness. There is no rebound and no guarding.  Musculoskeletal: Normal range of motion. She exhibits no edema or tenderness.  Lymphadenopathy:    She has no cervical adenopathy.  Neurological: She is oriented to  person, place, and time.  Skin: Skin is warm and dry. No rash noted. She is not diaphoretic. No erythema. No pallor.  Psychiatric: She has a normal mood and affect. Her behavior is normal. Judgment and thought content normal.  Vitals reviewed.    Lab Results   Component Value Date   WBC 8.2 06/03/2014   HGB 12.8 06/03/2014   HCT 37.4 06/03/2014   PLT 333.0 06/03/2014   GLUCOSE 95 06/03/2014   CHOL 208* 06/03/2014   TRIG 170.0* 06/03/2014   HDL 55.20 06/03/2014   LDLCALC 119* 06/03/2014   ALT 25 06/03/2014   AST 23 06/03/2014   NA 140 06/03/2014   K 4.0 06/03/2014   CL 101 06/03/2014   CREATININE 2.0* 06/03/2014   BUN 33* 06/03/2014   CO2 30 06/03/2014   TSH 1.98 06/03/2014   HGBA1C 5.3 04/28/2009       Assessment & Plan:

## 2014-12-23 NOTE — Telephone Encounter (Signed)
Received pharmacy rejection stating that insurance will not cover torsemaide  without a prior authorization. PA  information has been submitted to [facility] [place]. [place] will review the request and notify you of the determination decision directly, typically within 3 business days of your submission and once all necessary information is received

## 2014-12-27 NOTE — Assessment & Plan Note (Signed)
Her murmur sounds like AS and she reports an occasional episode of edema Will get an ECHO done to identify the cause of her murmur and to see what her EF is and to check for wall motion abnormalities

## 2014-12-27 NOTE — Assessment & Plan Note (Signed)
Her BP is well controlled Will change demadex to lasix due to a formulary issue Her lytes and renal function are stable

## 2014-12-27 NOTE — Assessment & Plan Note (Signed)
Her TSh is in the normal range Will stay on the current dose of synthroid

## 2015-01-05 DIAGNOSIS — H2513 Age-related nuclear cataract, bilateral: Secondary | ICD-10-CM | POA: Insufficient documentation

## 2015-01-09 ENCOUNTER — Encounter: Payer: Self-pay | Admitting: Internal Medicine

## 2015-01-10 ENCOUNTER — Other Ambulatory Visit: Payer: Self-pay | Admitting: Internal Medicine

## 2015-01-10 MED ORDER — BUPROPION HCL ER (XL) 150 MG PO TB24: ORAL_TABLET | ORAL | Status: DC

## 2015-01-11 ENCOUNTER — Telehealth: Payer: Self-pay | Admitting: Internal Medicine

## 2015-01-11 NOTE — Telephone Encounter (Signed)
Juliann Pulse at [employer] [phone]   Additional info needed for a quantity override that was sent in.

## 2015-01-11 NOTE — Telephone Encounter (Signed)
[employer] calling again regarding note below. Please call ASAP

## 2015-01-12 NOTE — Telephone Encounter (Signed)
Returned call to [employer] spoke with Chevette, additional clinical information provided. She advised that I should have an answer by Thursday of this week.

## 2015-01-16 ENCOUNTER — Other Ambulatory Visit: Payer: Self-pay | Admitting: Internal Medicine

## 2015-01-17 ENCOUNTER — Telehealth: Payer: Self-pay

## 2015-01-17 NOTE — Telephone Encounter (Signed)
Call to introduce wellness visit; the patient stated she was working and could not come until July, but I can call her back in June and she will schedule at that time.

## 2015-01-21 ENCOUNTER — Encounter: Payer: Self-pay | Admitting: Internal Medicine

## 2015-01-21 ENCOUNTER — Other Ambulatory Visit: Payer: Self-pay

## 2015-01-21 ENCOUNTER — Ambulatory Visit (HOSPITAL_COMMUNITY): Payer: Medicare Other | Attending: Cardiology

## 2015-01-21 DIAGNOSIS — I10 Essential (primary) hypertension: Secondary | ICD-10-CM | POA: Insufficient documentation

## 2015-01-21 DIAGNOSIS — Z87891 Personal history of nicotine dependence: Secondary | ICD-10-CM | POA: Diagnosis not present

## 2015-01-21 DIAGNOSIS — E669 Obesity, unspecified: Secondary | ICD-10-CM | POA: Insufficient documentation

## 2015-01-21 DIAGNOSIS — R011 Cardiac murmur, unspecified: Secondary | ICD-10-CM | POA: Insufficient documentation

## 2015-01-21 NOTE — Progress Notes (Signed)
2D Echo completed. 01/21/2015

## 2015-02-14 ENCOUNTER — Encounter: Payer: Self-pay | Admitting: Internal Medicine

## 2015-02-15 ENCOUNTER — Other Ambulatory Visit: Payer: Self-pay | Admitting: Internal Medicine

## 2015-02-15 DIAGNOSIS — I10 Essential (primary) hypertension: Secondary | ICD-10-CM

## 2015-02-15 MED ORDER — FUROSEMIDE 40 MG PO TABS: 40.0000 mg | ORAL_TABLET | Freq: Two times a day (BID) | ORAL | Status: DC

## 2015-03-01 ENCOUNTER — Telehealth: Payer: Self-pay

## 2015-03-01 NOTE — Telephone Encounter (Signed)
Call to the patient to schedule AWV prior to CPE in august Spouse will give the patient the message to call to schedule AWV either with apt in August or before apt in July.

## 2015-03-14 ENCOUNTER — Other Ambulatory Visit: Payer: Self-pay

## 2015-03-16 ENCOUNTER — Telehealth: Payer: Self-pay

## 2015-03-16 NOTE — Telephone Encounter (Signed)
Received pharmacy rejection stating that insurance will not cover Amitiza  without a prior authorization. PA submitted and denied stating that medication is covered but at a tier three cost, exception denied.

## 2015-03-17 ENCOUNTER — Telehealth: Payer: Self-pay

## 2015-03-17 NOTE — Telephone Encounter (Signed)
Received refill request from [facility]  request refills for phentermine 37.5 mg . Rx last written 10/29/14 #30/1rf and pt last seen 12/23/14  . Please advise Thanks

## 2015-03-19 NOTE — Telephone Encounter (Signed)
This is not supposed to be taken long term

## 2015-04-25 ENCOUNTER — Ambulatory Visit: Payer: Medicare Other | Admitting: Internal Medicine

## 2015-05-04 ENCOUNTER — Other Ambulatory Visit: Payer: Self-pay | Admitting: Internal Medicine

## 2015-05-06 ENCOUNTER — Telehealth: Payer: Self-pay

## 2015-05-06 NOTE — Telephone Encounter (Signed)
Patient called to educate on Medicare Wellness apt. LVM for the patient to call back to educate and schedule for wellness visit.   

## 2015-06-30 ENCOUNTER — Encounter: Payer: Self-pay | Admitting: Internal Medicine

## 2015-06-30 ENCOUNTER — Ambulatory Visit (INDEPENDENT_AMBULATORY_CARE_PROVIDER_SITE_OTHER): Payer: Medicare Other | Admitting: Internal Medicine

## 2015-06-30 ENCOUNTER — Other Ambulatory Visit (INDEPENDENT_AMBULATORY_CARE_PROVIDER_SITE_OTHER): Payer: Medicare Other

## 2015-06-30 VITALS — BP 148/78 | HR 76 | Temp 97.9°F | Resp 16 | Ht 64.0 in | Wt 208.0 lb

## 2015-06-30 DIAGNOSIS — N184 Chronic kidney disease, stage 4 (severe): Secondary | ICD-10-CM

## 2015-06-30 DIAGNOSIS — Z23 Encounter for immunization: Secondary | ICD-10-CM

## 2015-06-30 DIAGNOSIS — E038 Other specified hypothyroidism: Secondary | ICD-10-CM

## 2015-06-30 DIAGNOSIS — I10 Essential (primary) hypertension: Secondary | ICD-10-CM | POA: Diagnosis not present

## 2015-06-30 DIAGNOSIS — Z7951 Long term (current) use of inhaled steroids: Secondary | ICD-10-CM

## 2015-06-30 DIAGNOSIS — E213 Hyperparathyroidism, unspecified: Secondary | ICD-10-CM | POA: Diagnosis not present

## 2015-06-30 LAB — CBC WITH DIFFERENTIAL/PLATELET
BASOS ABS: 0.1 10*3/uL (ref 0.0–0.1)
BASOS PCT: 1 % (ref 0.0–3.0)
EOS ABS: 0.3 10*3/uL (ref 0.0–0.7)
Eosinophils Relative: 4 % (ref 0.0–5.0)
HEMATOCRIT: 39.4 % (ref 36.0–46.0)
Hemoglobin: 13.7 g/dL (ref 12.0–15.0)
LYMPHS ABS: 1.8 10*3/uL (ref 0.7–4.0)
LYMPHS PCT: 26.2 % (ref 12.0–46.0)
MCHC: 34.7 g/dL (ref 30.0–36.0)
MCV: 99.4 fl (ref 78.0–100.0)
Monocytes Absolute: 0.8 10*3/uL (ref 0.1–1.0)
Monocytes Relative: 11.4 % (ref 3.0–12.0)
NEUTROS ABS: 4 10*3/uL (ref 1.4–7.7)
NEUTROS PCT: 57.4 % (ref 43.0–77.0)
PLATELETS: 274 10*3/uL (ref 150.0–400.0)
RBC: 3.96 Mil/uL (ref 3.87–5.11)
RDW: 14.2 % (ref 11.5–15.5)
WBC: 7 10*3/uL (ref 4.0–10.5)

## 2015-06-30 LAB — BASIC METABOLIC PANEL
BUN: 28 mg/dL — ABNORMAL HIGH (ref 6–23)
CALCIUM: 10 mg/dL (ref 8.4–10.5)
CHLORIDE: 103 meq/L (ref 96–112)
CO2: 31 meq/L (ref 19–32)
Creatinine, Ser: 1.39 mg/dL — ABNORMAL HIGH (ref 0.40–1.20)
GFR: 39.79 mL/min — ABNORMAL LOW (ref >60.00)
GLUCOSE: 104 mg/dL — AB (ref 70–99)
POTASSIUM: 4.3 meq/L (ref 3.5–5.1)
SODIUM: 142 meq/L (ref 135–145)

## 2015-06-30 LAB — LIPID PANEL
CHOL/HDL RATIO: 3
CHOLESTEROL: 225 mg/dL — AB (ref 0–200)
HDL: 87.5 mg/dL (ref >39.00)
LDL CALC: 104 mg/dL — AB (ref 0–99)
NonHDL: 137.47
TRIGLYCERIDES: 165 mg/dL — AB (ref 0.0–149.0)
VLDL: 33 mg/dL (ref 0.0–40.0)

## 2015-06-30 LAB — TSH: TSH: 1.13 u[IU]/mL (ref 0.35–4.50)

## 2015-06-30 LAB — PHOSPHORUS: Phosphorus: 3.3 mg/dL (ref 2.3–4.6)

## 2015-06-30 NOTE — Progress Notes (Signed)
Subjective:  Patient ID: Leslie Edwards, female    DOB: 06/14/1946  Age: 69 y.o. MRN: [id number]  CC: Hypertension; Back Pain; and Osteoarthritis   HPI Leslie Edwards presents for f/up, she offers no new complaints, her LBP is unchanged, there is no radiation and no N/W/T into her LE's.  Outpatient Prescriptions Prior to Visit  Medication Sig Dispense Refill  . albuterol (PROVENTIL HFA;VENTOLIN HFA) 108 (90 BASE) MCG/ACT inhaler Inhale 2 puffs into the lungs every 6 (six) hours as needed.      . ALPRAZolam (XANAX) 0.25 MG tablet Take 1 tablet (0.25 mg total) by mouth 2 (two) times daily as needed for anxiety. 35 tablet 3  . budesonide-formoterol (SYMBICORT) 160-4.5 MCG/ACT inhaler Inhale 2 puffs into the lungs 2 (two) times daily.      Marland Kitchen buPROPion (WELLBUTRIN XL) 150 MG 24 hr tablet TAKE 2 TABLETS IN THE MORNING AND 1 TABLET IN THE EVENING. 90 tablet 11  . fexofenadine-pseudoephedrine (ALLEGRA-D) 60-120 MG per tablet Take 1 tablet by mouth daily as needed.      . furosemide (LASIX) 40 MG tablet Take 1 tablet (40 mg total) by mouth 2 (two) times daily. 60 tablet 5  . HYDROcodone-acetaminophen (NORCO/VICODIN) 5-325 MG per tablet Take 1 tablet by mouth every 6 (six) hours as needed for moderate pain. 90 tablet 0  . hyoscyamine (LEVSIN SL) 0.125 MG SL tablet Place 1 tablet (0.125 mg total) under the tongue every 4 (four) hours as needed. 30 tablet 11  . ipratropium-albuterol (DUONEB) 0.5-2.5 (3) MG/3ML SOLN Take 3 mLs by nebulization as needed.      Marland Kitchen levothyroxine (SYNTHROID, LEVOTHROID) 75 MCG tablet TAKE 1 TABLET ONCE DAILY. 90 tablet 0  . liothyronine (CYTOMEL) 25 MCG tablet TAKE 1 TABLET ONCE DAILY. 30 tablet 11  . mometasone (NASONEX) 50 MCG/ACT nasal spray Place 2 sprays into the nose daily. 17 g 11  . Omeprazole-Sodium Bicarbonate (ZEGERID) 20-1100 MG CAPS Take 1 capsule by mouth at bedtime as needed.     . potassium chloride (K-DUR) 10 MEQ tablet TAKE 1 TABLET DAILY. 90 tablet 1  .  tretinoin (RETIN-A) 0.025 % cream     . lubiprostone (AMITIZA) 8 MCG capsule Take 1 capsule (8 mcg total) by mouth 2 (two) times daily with a meal. 60 capsule 11  . phentermine (ADIPEX-P) 37.5 MG tablet TAKE 1 TABLET IN THE MORNING. 30 tablet 1  . potassium chloride (K-DUR) 10 MEQ tablet TAKE 1 TABLET DAILY. 90 tablet 0   No facility-administered medications prior to visit.    ROS Review of Systems  Constitutional: Negative.  Negative for fever, chills, diaphoresis, appetite change and fatigue.  HENT: Negative.   Eyes: Negative.   Respiratory: Negative.  Negative for cough, choking, chest tightness and shortness of breath.   Cardiovascular: Negative.  Negative for chest pain, palpitations and leg swelling.  Gastrointestinal: Negative.  Negative for nausea, vomiting, abdominal pain, diarrhea and constipation.  Endocrine: Negative.   Genitourinary: Negative.  Negative for dysuria, urgency, frequency, hematuria, decreased urine volume and difficulty urinating.  Musculoskeletal: Positive for back pain and arthralgias. Negative for myalgias, joint swelling, gait problem, neck pain and neck stiffness.  Skin: Negative.   Allergic/Immunologic: Negative.   Neurological: Negative.  Negative for dizziness, tremors, weakness, light-headedness, numbness and headaches.  Hematological: Negative.  Negative for adenopathy. Does not bruise/bleed easily.  Psychiatric/Behavioral: Negative.     Objective:  BP 148/78 mmHg  Pulse 76  Temp(Src) 97.9 F (36.6 C) (Oral)  Resp 16  Ht 5\' 4"  (1.626 m)  Wt 208 lb (94.348 kg)  BMI 35.69 kg/m2  SpO2 98%  BP Readings from Last 3 Encounters:  06/30/15 148/78  12/23/14 118/78  11/22/14 122/76    Wt Readings from Last 3 Encounters:  06/30/15 208 lb (94.348 kg)  12/23/14 209 lb 12 oz (95.142 kg)  11/22/14 207 lb (93.895 kg)    Physical Exam  Constitutional: She is oriented to person, place, and time. No distress.  HENT:  Head: Normocephalic and  atraumatic.  Mouth/Throat: Oropharynx is clear and moist. No oropharyngeal exudate.  Eyes: Conjunctivae are normal. Right eye exhibits no discharge. Left eye exhibits no discharge. No scleral icterus.  Neck: Normal range of motion. Neck supple. No JVD present. No tracheal deviation present. No thyromegaly present.  Cardiovascular: Normal rate, regular rhythm and intact distal pulses.  Exam reveals no gallop, no S3, no S4 and no friction rub.   Murmur heard.  Systolic murmur is present with a grade of 1/6   No diastolic murmur is present  Pulmonary/Chest: Effort normal and breath sounds normal. No stridor. No respiratory distress. She has no wheezes. She has no rales. She exhibits no tenderness.  Abdominal: Soft. Bowel sounds are normal. She exhibits no distension and no mass. There is no tenderness. There is no rebound and no guarding.  Musculoskeletal: Normal range of motion. She exhibits no edema or tenderness.  Lymphadenopathy:    She has no cervical adenopathy.  Neurological: She is oriented to person, place, and time.  Skin: Skin is warm and dry. No rash noted. She is not diaphoretic. No erythema. No pallor.  Vitals reviewed.   Lab Results  Component Value Date   WBC 7.0 06/30/2015   HGB 13.7 06/30/2015   HCT 39.4 06/30/2015   PLT 274.0 06/30/2015   GLUCOSE 104* 06/30/2015   CHOL 225* 06/30/2015   TRIG 165.0* 06/30/2015   HDL 87.50 06/30/2015   LDLCALC 104* 06/30/2015   ALT 22 12/23/2014   AST 19 12/23/2014   NA 142 06/30/2015   K 4.3 06/30/2015   CL 103 06/30/2015   CREATININE 1.39* 06/30/2015   BUN 28* 06/30/2015   CO2 31 06/30/2015   TSH 1.13 06/30/2015   HGBA1C 5.3 04/28/2009    Mm Rt Breast Bx W Loc Dev 1st Lesion Image Bx Spec Stereo Guide  07/30/2014  ADDENDUM REPORT: 07/30/2014 12:01 ADDENDUM: Pathology revealed benign breast tissue with calcifications with hyalinized and calcified fibroadenomatoid nodules in the right breast. This was found to be concordant by  Dr. Lovey Newcomer. Pathology was discussed with the patient by telephone. She reported doing well after the biopsy. Post biopsy instructions and care were reviewed and her questions were answered. She was encouraged to call [facility] for any additional concerns. She was asked to return for annual screening mammography. Pathology results reported by Susa Raring RN, BSN on July 30, 2014. Electronically Signed   By: Lovey Newcomer M.D.   On: 07/30/2014 12:01   07/30/2014  CLINICAL DATA:  Suspicious right breast calcifications. EXAM: RIGHT BREAST STEREOTACTIC CORE NEEDLE BIOPSY COMPARISON:  Previous exams. FINDINGS: The patient and I discussed the procedure of stereotactic-guided biopsy including benefits and alternatives. We discussed the high likelihood of a successful procedure. We discussed the risks of the procedure including infection, bleeding, tissue injury, clip migration, and inadequate sampling. Informed written consent was given. The usual time out protocol was performed immediately prior to the procedure. Using sterile technique  and 2% Lidocaine as local anesthetic, under stereotactic guidance, a 9 gauge vacuum assisted core needle biopsy device was used to perform core needle biopsy of calcifications within the lower outer right breast using a lateral approach. Specimen radiograph was performed showing calcifications. Specimens with calcifications are identified for pathology. At the conclusion of the procedure, a X shaped tissue marker clip was deployed into the biopsy cavity. Follow-up 2-view mammogram confirmed clip in appropriate position. IMPRESSION: Stereotactic-guided biopsy of right breast calcifications. No apparent complications. Electronically Signed: By: Lovey Newcomer M.D. On: 07/29/2014 10:57    Assessment & Plan:   Genevea was seen today for hypertension, back pain and osteoarthritis.  Diagnoses and all orders for this visit:  Long term current use of  inhaled steroid -     DG Bone Density; Future  Essential hypertension- her BP is well controlled, lytes and renal function are stable -     Basic metabolic panel; Future  Other specified hypothyroidism - her TSh is in the normal range, she will stay on the current dose of synthroid -     TSH; Future -     Lipid panel; Future  Kidney disease, chronic, stage IV (GFR 15-29 ml/min) (HCC)- her renal function is table, she will avoid nsaids and will maintain good BP control -     Basic metabolic panel; Future -     CBC with Differential/Platelet; Future -     Phosphorus; Future  Parathyroid hormone excess (HCC)- PTH, Ca++, PO-- are all normal, will follow for now without intervention -     PTH, intact and calcium; Future -     Phosphorus; Future -     DG Bone Density; Future  Need for influenza vaccination -     Flu Vaccine QUAD 36+ mos IM  Need for 23-polyvalent pneumococcal polysaccharide vaccine -     Pneumococcal polysaccharide vaccine 23-valent greater than or equal to 2yo subcutaneous/IM   I have discontinued Ms. Fjelstad's lubiprostone and phentermine. I am also having her maintain her albuterol, budesonide-formoterol, fexofenadine-pseudoephedrine, ipratropium-albuterol, Omeprazole-Sodium Bicarbonate, tretinoin, mometasone, hyoscyamine, HYDROcodone-acetaminophen, ALPRAZolam, potassium chloride, buPROPion, liothyronine, furosemide, and levothyroxine.  No orders of the defined types were placed in this encounter.     Follow-up: Return in about 4 months (around 10/31/2015).  Scarlette Calico, MD

## 2015-06-30 NOTE — Patient Instructions (Signed)

## 2015-06-30 NOTE — Progress Notes (Signed)
Pre visit review using our clinic review tool, if applicable. No additional management support is needed unless otherwise documented below in the visit note. 

## 2015-07-01 ENCOUNTER — Encounter: Payer: Self-pay | Admitting: Internal Medicine

## 2015-07-01 LAB — PTH, INTACT AND CALCIUM
Calcium: 9.9 mg/dL (ref 8.4–10.5)
PTH: 61 pg/mL (ref 14–64)

## 2015-07-03 ENCOUNTER — Encounter: Payer: Self-pay | Admitting: Internal Medicine

## 2015-08-07 ENCOUNTER — Other Ambulatory Visit: Payer: Self-pay | Admitting: Internal Medicine

## 2015-10-18 ENCOUNTER — Other Ambulatory Visit: Payer: Self-pay | Admitting: Internal Medicine

## 2015-10-31 ENCOUNTER — Other Ambulatory Visit (INDEPENDENT_AMBULATORY_CARE_PROVIDER_SITE_OTHER): Payer: Medicare Other

## 2015-10-31 ENCOUNTER — Ambulatory Visit (INDEPENDENT_AMBULATORY_CARE_PROVIDER_SITE_OTHER): Payer: Medicare Other | Admitting: Internal Medicine

## 2015-10-31 ENCOUNTER — Encounter: Payer: Self-pay | Admitting: Internal Medicine

## 2015-10-31 VITALS — BP 120/70 | HR 82 | Temp 98.1°F | Resp 16 | Ht 64.0 in | Wt 215.0 lb

## 2015-10-31 DIAGNOSIS — I10 Essential (primary) hypertension: Secondary | ICD-10-CM | POA: Diagnosis not present

## 2015-10-31 DIAGNOSIS — N184 Chronic kidney disease, stage 4 (severe): Secondary | ICD-10-CM | POA: Diagnosis not present

## 2015-10-31 DIAGNOSIS — B351 Tinea unguium: Secondary | ICD-10-CM

## 2015-10-31 DIAGNOSIS — R0609 Other forms of dyspnea: Secondary | ICD-10-CM

## 2015-10-31 DIAGNOSIS — E038 Other specified hypothyroidism: Secondary | ICD-10-CM | POA: Diagnosis not present

## 2015-10-31 DIAGNOSIS — R9431 Abnormal electrocardiogram [ECG] [EKG]: Secondary | ICD-10-CM

## 2015-10-31 DIAGNOSIS — R0600 Dyspnea, unspecified: Secondary | ICD-10-CM

## 2015-10-31 DIAGNOSIS — F418 Other specified anxiety disorders: Secondary | ICD-10-CM

## 2015-10-31 DIAGNOSIS — F411 Generalized anxiety disorder: Secondary | ICD-10-CM | POA: Diagnosis not present

## 2015-10-31 LAB — LIPID PANEL
CHOL/HDL RATIO: 3
Cholesterol: 246 mg/dL — ABNORMAL HIGH (ref 0–200)
HDL: 84.4 mg/dL (ref >39.00)
NONHDL: 161.9
TRIGLYCERIDES: 265 mg/dL — AB (ref 0.0–149.0)
VLDL: 53 mg/dL — AB (ref 0.0–40.0)

## 2015-10-31 LAB — CBC WITH DIFFERENTIAL/PLATELET
BASOS ABS: 0.1 10*3/uL (ref 0.0–0.1)
BASOS PCT: 0.7 % (ref 0.0–3.0)
Eosinophils Absolute: 0.3 10*3/uL (ref 0.0–0.7)
Eosinophils Relative: 3.7 % (ref 0.0–5.0)
HEMATOCRIT: 39.6 % (ref 36.0–46.0)
HEMOGLOBIN: 13.4 g/dL (ref 12.0–15.0)
LYMPHS PCT: 24.7 % (ref 12.0–46.0)
Lymphs Abs: 1.9 10*3/uL (ref 0.7–4.0)
MCHC: 33.9 g/dL (ref 30.0–36.0)
MCV: 99.1 fl (ref 78.0–100.0)
MONOS PCT: 10.7 % (ref 3.0–12.0)
Monocytes Absolute: 0.8 10*3/uL (ref 0.1–1.0)
NEUTROS ABS: 4.7 10*3/uL (ref 1.4–7.7)
Neutrophils Relative %: 60.2 % (ref 43.0–77.0)
PLATELETS: 276 10*3/uL (ref 150.0–400.0)
RBC: 4 Mil/uL (ref 3.87–5.11)
RDW: 13.7 % (ref 11.5–15.5)
WBC: 7.8 10*3/uL (ref 4.0–10.5)

## 2015-10-31 LAB — COMPREHENSIVE METABOLIC PANEL
ALBUMIN: 4.8 g/dL (ref 3.5–5.2)
ALT: 21 U/L (ref 0–35)
AST: 18 U/L (ref 0–37)
Alkaline Phosphatase: 62 U/L (ref 39–117)
BUN: 26 mg/dL — ABNORMAL HIGH (ref 6–23)
CALCIUM: 9.6 mg/dL (ref 8.4–10.5)
CHLORIDE: 103 meq/L (ref 96–112)
CO2: 28 meq/L (ref 19–32)
Creatinine, Ser: 1.28 mg/dL — ABNORMAL HIGH (ref 0.40–1.20)
GFR: 43.72 mL/min — AB (ref >60.00)
Glucose, Bld: 93 mg/dL (ref 70–99)
Potassium: 3.8 mEq/L (ref 3.5–5.1)
Sodium: 140 mEq/L (ref 135–145)
Total Bilirubin: 0.4 mg/dL (ref 0.2–1.2)
Total Protein: 7.3 g/dL (ref 6.0–8.3)

## 2015-10-31 LAB — LDL CHOLESTEROL, DIRECT: LDL DIRECT: 111 mg/dL

## 2015-10-31 LAB — TSH: TSH: 1.9 u[IU]/mL (ref 0.35–4.50)

## 2015-10-31 LAB — T3: T3 TOTAL: 56 ng/dL — AB (ref 76–181)

## 2015-10-31 LAB — T4, FREE: Free T4: 0.86 ng/dL (ref 0.60–1.60)

## 2015-10-31 MED ORDER — TERBINAFINE HCL 250 MG PO TABS: 250.0000 mg | ORAL_TABLET | Freq: Every day | ORAL | Status: DC

## 2015-10-31 MED ORDER — VILAZODONE HCL 40 MG PO TABS: 40.0000 mg | ORAL_TABLET | Freq: Every day | ORAL | Status: DC

## 2015-10-31 MED ORDER — ALPRAZOLAM 0.25 MG PO TABS: 0.2500 mg | ORAL_TABLET | Freq: Two times a day (BID) | ORAL | Status: DC | PRN

## 2015-10-31 NOTE — Patient Instructions (Signed)

## 2015-10-31 NOTE — Progress Notes (Signed)
Pre visit review using our clinic review tool, if applicable. No additional management support is needed unless otherwise documented below in the visit note. 

## 2015-10-31 NOTE — Progress Notes (Signed)
Subjective:  Patient ID: Leslie Edwards, female    DOB: July 20, 1946  Age: 70 y.o. MRN: [id number]  CC: Hypothyroidism   HPI Nashia H Grob presents for follow-up on her thyroid disease but she also complains of crying spells, depression, anxiety, panic episodes, fatigue, weight gain and anhedonia. She also complains of worsening dyspnea on exertion over the last few months. She lost her son the last year to cancer and feels like she is just not recovering from it. She takes Xanax for symptom relief and says Wellbutrin has helped some but not much.  Outpatient Prescriptions Prior to Visit  Medication Sig Dispense Refill  . albuterol (PROVENTIL HFA;VENTOLIN HFA) 108 (90 BASE) MCG/ACT inhaler Inhale 2 puffs into the lungs every 6 (six) hours as needed.      . budesonide-formoterol (SYMBICORT) 160-4.5 MCG/ACT inhaler Inhale 2 puffs into the lungs 2 (two) times daily.      Marland Kitchen buPROPion (WELLBUTRIN XL) 150 MG 24 hr tablet TAKE 2 TABLETS IN THE MORNING AND 1 TABLET IN THE EVENING. 90 tablet 11  . fexofenadine-pseudoephedrine (ALLEGRA-D) 60-120 MG per tablet Take 1 tablet by mouth daily as needed.      . furosemide (LASIX) 40 MG tablet TAKE 1 TABLET TWICE DAILY. 60 tablet 0  . HYDROcodone-acetaminophen (NORCO/VICODIN) 5-325 MG per tablet Take 1 tablet by mouth every 6 (six) hours as needed for moderate pain. 90 tablet 0  . hyoscyamine (LEVSIN SL) 0.125 MG SL tablet Place 1 tablet (0.125 mg total) under the tongue every 4 (four) hours as needed. 30 tablet 11  . ipratropium-albuterol (DUONEB) 0.5-2.5 (3) MG/3ML SOLN Take 3 mLs by nebulization as needed.      Marland Kitchen levothyroxine (SYNTHROID, LEVOTHROID) 75 MCG tablet TAKE 1 TABLET ONCE DAILY. 90 tablet 1  . liothyronine (CYTOMEL) 25 MCG tablet TAKE 1 TABLET ONCE DAILY. 30 tablet 11  . mometasone (NASONEX) 50 MCG/ACT nasal spray Place 2 sprays into the nose daily. 17 g 11  . Omeprazole-Sodium Bicarbonate (ZEGERID) 20-1100 MG CAPS Take 1 capsule by mouth at  bedtime as needed.     . potassium chloride (K-DUR) 10 MEQ tablet TAKE 1 TABLET DAILY. 90 tablet 1  . potassium chloride (K-DUR) 10 MEQ tablet TAKE 1 TABLET DAILY. 90 tablet 3  . tretinoin (RETIN-A) 0.025 % cream     . ALPRAZolam (XANAX) 0.25 MG tablet Take 1 tablet (0.25 mg total) by mouth 2 (two) times daily as needed for anxiety. 35 tablet 3   No facility-administered medications prior to visit.    ROS Review of Systems  Constitutional: Positive for fatigue. Negative for fever, diaphoresis and appetite change.  HENT: Negative.   Eyes: Negative.   Respiratory: Positive for shortness of breath. Negative for cough, choking and chest tightness.   Cardiovascular: Negative.  Negative for chest pain, palpitations and leg swelling.  Gastrointestinal: Negative.  Negative for nausea, vomiting, abdominal pain, diarrhea and constipation.  Endocrine: Negative.   Genitourinary: Negative.   Musculoskeletal: Negative.  Negative for myalgias, back pain, joint swelling, arthralgias and gait problem.  Skin: Negative.  Negative for color change and rash.       She complains of abnormal great toenails on both sides, this is more prominent on the left side than the right side and the left side is associated with some discomfort.  Allergic/Immunologic: Negative.   Neurological: Negative.  Negative for dizziness, tremors, syncope, light-headedness, numbness and headaches.       She denies diaphoresis, dizziness, lightheadedness, or syncope.  Hematological: Negative.  Negative for adenopathy. Does not bruise/bleed easily.  Psychiatric/Behavioral: Positive for sleep disturbance and dysphoric mood. The patient is nervous/anxious.     Objective:  BP 120/70 mmHg  Pulse 82  Temp(Src) 98.1 F (36.7 C) (Oral)  Resp 16  Ht 5\' 4"  (1.626 m)  Wt 215 lb (97.523 kg)  BMI 36.89 kg/m2  SpO2 99%  BP Readings from Last 3 Encounters:  10/31/15 120/70  06/30/15 148/78  12/23/14 118/78    Wt Readings from Last 3  Encounters:  10/31/15 215 lb (97.523 kg)  06/30/15 208 lb (94.348 kg)  12/23/14 209 lb 12 oz (95.142 kg)    Physical Exam  Constitutional: She is oriented to person, place, and time. No distress.  HENT:  Head: Normocephalic and atraumatic.  Mouth/Throat: Oropharynx is clear and moist. No oropharyngeal exudate.  Eyes: Conjunctivae are normal. Right eye exhibits no discharge. Left eye exhibits no discharge. No scleral icterus.  Neck: Normal range of motion. Neck supple. No JVD present. No tracheal deviation present. No thyromegaly present.  Cardiovascular: Normal rate, regular rhythm, S1 normal, S2 normal and intact distal pulses.  Exam reveals no gallop, no S3, no S4 and no friction rub.   Murmur heard.  Systolic murmur is present with a grade of 1/6   No diastolic murmur is present  EKG-  Sinus  Rhythm  -Left axis -anterior fascicular block.   -Anteroseptal infarct -age undetermined  -Old inferior infarct.   ABNORMAL   Pulmonary/Chest: Effort normal and breath sounds normal. No stridor. No respiratory distress. She has no wheezes. She has no rales. She exhibits no tenderness.  Abdominal: Soft. Bowel sounds are normal. She exhibits no distension and no mass. There is no tenderness. There is no rebound and no guarding.  Musculoskeletal: Normal range of motion. She exhibits no edema or tenderness.  Lymphadenopathy:    She has no cervical adenopathy.  Neurological: She is oriented to person, place, and time.  Skin: Skin is warm and dry. No rash noted. She is not diaphoretic. No erythema. No pallor.  Psychiatric: Her speech is normal and behavior is normal. Judgment and thought content normal. Her mood appears anxious. Her affect is not angry. Cognition and memory are normal. She exhibits a depressed mood. She expresses no homicidal and no suicidal ideation. She expresses no suicidal plans and no homicidal plans.  She is intermittently tearful today    Lab Results  Component Value Date    WBC 7.8 10/31/2015   HGB 13.4 10/31/2015   HCT 39.6 10/31/2015   PLT 276.0 10/31/2015   GLUCOSE 93 10/31/2015   CHOL 246* 10/31/2015   TRIG 265.0* 10/31/2015   HDL 84.40 10/31/2015   LDLDIRECT 111.0 10/31/2015   LDLCALC 104* 06/30/2015   ALT 21 10/31/2015   AST 18 10/31/2015   NA 140 10/31/2015   K 3.8 10/31/2015   CL 103 10/31/2015   CREATININE 1.28* 10/31/2015   BUN 26* 10/31/2015   CO2 28 10/31/2015   TSH 1.90 10/31/2015   HGBA1C 5.3 04/28/2009    Mm Rt Breast Bx W Loc Dev 1st Lesion Image Bx Spec Stereo Guide  07/30/2014  ADDENDUM REPORT: 07/30/2014 12:01 ADDENDUM: Pathology revealed benign breast tissue with calcifications with hyalinized and calcified fibroadenomatoid nodules in the right breast. This was found to be concordant by Dr. Lovey Newcomer. Pathology was discussed with the patient by telephone. She reported doing well after the biopsy. Post biopsy instructions and care were reviewed and her questions were answered.  She was encouraged to call [facility] for any additional concerns. She was asked to return for annual screening mammography. Pathology results reported by Susa Raring RN, BSN on July 30, 2014. Electronically Signed   By: Lovey Newcomer M.D.   On: 07/30/2014 12:01   07/30/2014  CLINICAL DATA:  Suspicious right breast calcifications. EXAM: RIGHT BREAST STEREOTACTIC CORE NEEDLE BIOPSY COMPARISON:  Previous exams. FINDINGS: The patient and I discussed the procedure of stereotactic-guided biopsy including benefits and alternatives. We discussed the high likelihood of a successful procedure. We discussed the risks of the procedure including infection, bleeding, tissue injury, clip migration, and inadequate sampling. Informed written consent was given. The usual time out protocol was performed immediately prior to the procedure. Using sterile technique and 2% Lidocaine as local anesthetic, under stereotactic guidance, a 9 gauge vacuum  assisted core needle biopsy device was used to perform core needle biopsy of calcifications within the lower outer right breast using a lateral approach. Specimen radiograph was performed showing calcifications. Specimens with calcifications are identified for pathology. At the conclusion of the procedure, a X shaped tissue marker clip was deployed into the biopsy cavity. Follow-up 2-view mammogram confirmed clip in appropriate position. IMPRESSION: Stereotactic-guided biopsy of right breast calcifications. No apparent complications. Electronically Signed: By: Lovey Newcomer M.D. On: 07/29/2014 10:57    Assessment & Plan:   Blakeli was seen today for hypothyroidism.  Diagnoses and all orders for this visit:  GAD (generalized anxiety disorder)- she will continue Xanax as needed and I have asked her to add on Viibryd  as well -     ALPRAZolam (XANAX) 0.25 MG tablet; Take 1 tablet (0.25 mg total) by mouth 2 (two) times daily as needed for anxiety.  Essential hypertension- her blood pressures well controlled, electrolytes and renal function are stable -     Comprehensive metabolic panel; Future -     CBC with Differential/Platelet; Future  Other specified hypothyroidism- her TSH is in the normal range, she will remain on the current dose. -     Lipid panel; Future -     TSH; Future -     T4, free; Future -     T3; Future  Kidney disease, chronic, stage IV (GFR 15-29 ml/min) ([place])- renal function is stable, will continue to maintain good blood pressure control and she will avoid nephrotoxic agents. -     Comprehensive metabolic panel; Future -     CBC with Differential/Platelet; Future  DOE (dyspnea on exertion)- her EKG today shows a possible Q-wave in lead V2, she has a prior echocardiogram that showed mild pulmonary artery hypertension, I've asked her to see cardiology for further evaluation. -     EKG 12-Lead -     Ambulatory referral to Cardiology -     CBC with Differential/Platelet;  Future  Nonspecific abnormal electrocardiogram (ECG) (EKG)-  -     Ambulatory referral to Cardiology  Onychomycosis of toenail- her LFTs are normal today, she will start terbinafine and will return in one month for a recheck of her LFTs, in the meantime she will let me know if she develops rash, abdominal pain, nausea, or loss of appetite. -     terbinafine (LAMISIL) 250 MG tablet; Take 1 tablet (250 mg total) by mouth daily.  Depression with anxiety -     Vilazodone HCl (VIIBRYD) 40 MG TABS; Take 1 tablet (40 mg total) by mouth daily.  Other orders -     Cancel:  ALPRAZolam (XANAX) 0.25 MG tablet; Take 1 tablet (0.25 mg total) by mouth 2 (two) times daily as needed for anxiety.   I am having Ms. Kluth start on terbinafine and Vilazodone HCl. I am also having her maintain her albuterol, budesonide-formoterol, fexofenadine-pseudoephedrine, ipratropium-albuterol, Omeprazole-Sodium Bicarbonate, tretinoin, mometasone, hyoscyamine, HYDROcodone-acetaminophen, potassium chloride, buPROPion, liothyronine, potassium chloride, levothyroxine, furosemide, and ALPRAZolam.  Meds ordered this encounter  Medications  . ALPRAZolam (XANAX) 0.25 MG tablet    Sig: Take 1 tablet (0.25 mg total) by mouth 2 (two) times daily as needed for anxiety.    Dispense:  35 tablet    Refill:  3  . terbinafine (LAMISIL) 250 MG tablet    Sig: Take 1 tablet (250 mg total) by mouth daily.    Dispense:  30 tablet    Refill:  0  . Vilazodone HCl (VIIBRYD) 40 MG TABS    Sig: Take 1 tablet (40 mg total) by mouth daily.    Dispense:  30 tablet    Refill:  11     Follow-up: Return in about 4 months (around 02/28/2016).  Scarlette Calico, MD

## 2015-11-01 ENCOUNTER — Other Ambulatory Visit: Payer: Self-pay | Admitting: Internal Medicine

## 2015-11-01 ENCOUNTER — Encounter: Payer: Self-pay | Admitting: Internal Medicine

## 2015-11-01 DIAGNOSIS — E781 Pure hyperglyceridemia: Secondary | ICD-10-CM

## 2015-11-01 MED ORDER — OMEGA-3-ACID ETHYL ESTERS 1 G PO CAPS: 2.0000 g | ORAL_CAPSULE | Freq: Two times a day (BID) | ORAL | Status: DC

## 2015-11-03 ENCOUNTER — Ambulatory Visit (INDEPENDENT_AMBULATORY_CARE_PROVIDER_SITE_OTHER)
Admission: RE | Admit: 2015-11-03 | Discharge: 2015-11-03 | Disposition: A | Discharge status: Home / Self Care | Payer: Medicare Other | Source: Ambulatory Visit

## 2015-11-03 DIAGNOSIS — E213 Hyperparathyroidism, unspecified: Secondary | ICD-10-CM

## 2015-11-03 DIAGNOSIS — Z7951 Long term (current) use of inhaled steroids: Secondary | ICD-10-CM | POA: Diagnosis not present

## 2015-11-04 ENCOUNTER — Other Ambulatory Visit: Payer: Self-pay | Admitting: Internal Medicine

## 2015-11-04 ENCOUNTER — Encounter: Payer: Self-pay | Admitting: Internal Medicine

## 2015-11-04 DIAGNOSIS — E038 Other specified hypothyroidism: Secondary | ICD-10-CM

## 2015-11-04 LAB — HM DEXA SCAN

## 2015-11-04 MED ORDER — LIOTHYRONINE SODIUM 5 MCG PO TABS: 5.0000 ug | ORAL_TABLET | Freq: Every day | ORAL | Status: DC

## 2015-11-04 MED ORDER — LIOTHYRONINE SODIUM 25 MCG PO TABS: 25.0000 ug | ORAL_TABLET | Freq: Every day | ORAL | Status: DC

## 2015-11-04 NOTE — Addendum Note (Signed)
Addended by: Janith Lima on: 11/04/2015 06:27 PM   Modules accepted: Miquel Dunn

## 2015-11-24 NOTE — Progress Notes (Signed)
Cardiology Office Note   Date:  11/25/2015   ID:  Leslie Edwards, DOB [birth year], MRN [id number]  PCP:  Scarlette Calico, MD  Cardiologist:   Minus Breeding, MD   No chief complaint on file.     History of Present Illness: Leslie Edwards is a 70 y.o. female who presents for evaluation of dyspnea. She has no past cardiac history. She has had increased shortness of breath. This has been occurring for the past year. Unfortunately her son was 6 died last year of lymphoma. She has been grieving since then. She has gained about 12-15 pounds. She has been suffering some difficulty sleeping and panic at night. She denies any chest pressure, neck or arm discomfort. She doesn't have palpitations, she wakes up at night somewhat panicked. She doesn't have presyncope or syncope. She does get dyspneic with activity such as walking around her job or pulling boxes on her job. She'll get short of breath pushing a vacuum. She does not have classic PND or orthopnea. She's had no weight gain or edema. She did have EKG with some subtle T-wave inversion in V2 only. She had an echocardiogram which I reviewed which had normal left ventricular function. She had mild diastolic dysfunction with some very mildly elevated pulmonary pressures.   Past Medical History  Diagnosis Date  . Allergy   . Anxiety   . Asthma   . Hypertension   . GERD (gastroesophageal reflux disease)   . Hypothyroidism   . [place]   . VENOUS INSUFFICIENCY, CHRONIC 04/28/2007    Qualifier: Diagnosis of  By: Scherrie Gerlach    . Parathyroid hormone excess ([place]) 08/31/2013    Secondary to low dietary calcium/calcium malabsorption   . OSTEOPOROSIS 05/06/2007    Qualifier: Diagnosis of  By: Arnoldo Morale MD, Balinda Quails   . PLANTAR FASCIITIS, LEFT 07/05/2009    Qualifier: Diagnosis of  By: Arnoldo Morale MD, Balinda Quails   . Rosacea 02/25/2009    Qualifier: Diagnosis of  By: Arnoldo Morale MD, [place], B12 DEFICIENCY 01/28/2009    Qualifier: Diagnosis of  By:  Arnoldo Morale MD, Balinda Quails   . UNSPECIFIED IRON DEFICIENCY ANEMIA 01/28/2009    Qualifier: Diagnosis of  By: Arnoldo Morale MD, Balinda Quails     Past Surgical History  Procedure Laterality Date  . Carpal tunnel release    . Tonsillectomy    . [employer] fundiplication    . Knee arthroscopy      right  . Lipoma excision  1965    axilary     Current Outpatient Prescriptions  Medication Sig Dispense Refill  . albuterol (PROVENTIL HFA;VENTOLIN HFA) 108 (90 BASE) MCG/ACT inhaler Inhale 2 puffs into the lungs every 6 (six) hours as needed.      . ALPRAZolam (XANAX) 0.25 MG tablet Take 1 tablet (0.25 mg total) by mouth 2 (two) times daily as needed for anxiety. 35 tablet 3  . budesonide-formoterol (SYMBICORT) 160-4.5 MCG/ACT inhaler Inhale 2 puffs into the lungs 2 (two) times daily.      Marland Kitchen buPROPion (WELLBUTRIN XL) 150 MG 24 hr tablet TAKE 2 TABLETS IN THE MORNING AND 1 TABLET IN THE EVENING. 90 tablet 11  . fexofenadine-pseudoephedrine (ALLEGRA-D) 60-120 MG per tablet Take 1 tablet by mouth daily as needed.      . furosemide (LASIX) 40 MG tablet TAKE 1 TABLET TWICE DAILY. 60 tablet 0  . HYDROcodone-acetaminophen (NORCO/VICODIN) 5-325 MG per tablet Take 1 tablet by mouth every 6 (six) hours  as needed for moderate pain. 90 tablet 0  . hyoscyamine (LEVSIN SL) 0.125 MG SL tablet Place 1 tablet (0.125 mg total) under the tongue every 4 (four) hours as needed. 30 tablet 11  . ipratropium-albuterol (DUONEB) 0.5-2.5 (3) MG/3ML SOLN Take 3 mLs by nebulization as needed.      Marland Kitchen levothyroxine (SYNTHROID, LEVOTHROID) 75 MCG tablet TAKE 1 TABLET ONCE DAILY. 90 tablet 1  . liothyronine (CYTOMEL) 25 MCG tablet Take 1 tablet (25 mcg total) by mouth daily. 90 tablet 1  . mometasone (NASONEX) 50 MCG/ACT nasal spray Place 2 sprays into the nose daily. 17 g 11  . omega-3 acid ethyl esters (LOVAZA) 1 g capsule Take 2 capsules (2 g total) by mouth 2 (two) times daily. 120 capsule 11  . Omeprazole-Sodium Bicarbonate (ZEGERID) 20-1100 MG  CAPS Take 1 capsule by mouth at bedtime as needed.     . potassium chloride (K-DUR) 10 MEQ tablet TAKE 1 TABLET DAILY. 90 tablet 1  . terbinafine (LAMISIL) 250 MG tablet Take 1 tablet (250 mg total) by mouth daily. 30 tablet 0  . tretinoin (RETIN-A) 0.025 % cream     . Vilazodone HCl (VIIBRYD) 40 MG TABS Take 1 tablet (40 mg total) by mouth daily. 30 tablet 11  . [DISCONTINUED] K-DUR 10 MEQ tablet TAKE 1 TABLET DAILY. 30 each 11   No current facility-administered medications for this visit.    Allergies:   Penicillins and Prochlorperazine edisylate    Social History:  The patient  reports that she quit smoking about 40 years ago. She has never used smokeless tobacco. She reports that she drinks about 1.8 oz of alcohol per week. She reports that she does not use illicit drugs.   Family History:  The patient's family history includes Cancer in her maternal aunt, maternal uncle, and mother; Lymphoma in her son; Macular degeneration in her father.    ROS:  Please see the history of present illness.   Otherwise, review of systems are positive for none.   All other systems are reviewed and negative.    PHYSICAL EXAM: VS:  BP 150/84 mmHg  Pulse 76  Ht 5\' 4"  (1.626 m)  Wt 212 lb 3 oz (96.248 kg)  BMI 36.40 kg/m2 , BMI Body mass index is 36.4 kg/(m^2). GENERAL:  Well appearing HEENT:  Pupils equal round and reactive, fundi not visualized, oral mucosa unremarkable NECK:  No jugular venous distention, waveform within normal limits, carotid upstroke brisk and symmetric, no bruits, no thyromegaly LYMPHATICS:  No cervical, inguinal adenopathy LUNGS:  Clear to auscultation bilaterally BACK:  No CVA tenderness CHEST:  Unremarkable HEART:  PMI not displaced or sustained,S1 and S2 within normal limits, no S3, no S4, no clicks, no rubs, no murmurs ABD:  Flat, positive bowel sounds normal in frequency in pitch, no bruits, no rebound, no guarding, no midline pulsatile mass, no hepatomegaly, no  splenomegaly EXT:  2 plus pulses throughout, no edema, no cyanosis no clubbing SKIN:  No rashes no nodules NEURO:  Cranial nerves II through XII grossly intact, motor grossly intact throughout PSYCH:  Cognitively intact, oriented to person place and time    EKG:  EKG is ordered today. The ekg ordered today demonstrates sinus rhythm, rate 76, left axis deviation, left anterior fascicular block, poor anterior R wave progression.   Recent Labs: 10/31/2015: ALT 21; BUN 26*; Creatinine, Ser 1.28*; Hemoglobin 13.4; Platelets 276.0; Potassium 3.8; Sodium 140; TSH 1.90    Lipid Panel    Component Value  Date/Time   CHOL 246* 10/31/2015 1505   TRIG 265.0* 10/31/2015 1505   HDL 84.40 10/31/2015 1505   CHOLHDL 3 10/31/2015 1505   VLDL 53.0* 10/31/2015 1505   LDLCALC 104* 06/30/2015 1046   LDLDIRECT 111.0 10/31/2015 1505      Wt Readings from Last 3 Encounters:  11/25/15 212 lb 3 oz (96.248 kg)  10/31/15 215 lb (97.523 kg)  06/30/15 208 lb (94.348 kg)      Other studies Reviewed: Additional studies/ records that were reviewed today include: Echo, labs. Review of the above records demonstrates:  Please see elsewhere in the note.     ASSESSMENT AND PLAN:  DYSPNEA:  This has been mildly progressive. I don't strongly suspect obstructive coronary disease. Her echocardiogram was essentially unremarkable except for some mild diastolic dysfunction. We talked about reducing her salt. For now she'll continue the diuretic. We talked at great length about exercise regimen and weight loss. Should she get more short of breath rather than less as she progresses in this I would like to see her back for further evaluation probably stress testing. However, this is likely related to deconditioning, weight gain related to her breathing and perhaps some very mild diastolic dysfunction. EKG demonstrates nonspecific findings.  HTN: Her blood pressure is elevated today only. No change in therapy is indicated.  She'll keep an eye on this. This should come down with weight loss and exercise.    OBESITY:  The patient understands the need to lose weight with diet and exercise. We have discussed specific strategies for this.  Current medicines are reviewed at length with the patient today.  The patient does not have concerns regarding medicines.  The following changes have been made:  no change  Labs/ tests ordered today include: none  No orders of the defined types were placed in this encounter.     Disposition:   FU with me as needed.      Signed, Minus Breeding, MD  11/25/2015 11:41 AM    [facility]

## 2015-11-25 ENCOUNTER — Ambulatory Visit (INDEPENDENT_AMBULATORY_CARE_PROVIDER_SITE_OTHER): Payer: Medicare Other | Admitting: Cardiology

## 2015-11-25 ENCOUNTER — Encounter: Payer: Self-pay | Admitting: Cardiology

## 2015-11-25 VITALS — BP 150/84 | HR 76 | Ht 64.0 in | Wt 212.2 lb

## 2015-11-25 DIAGNOSIS — R0600 Dyspnea, unspecified: Secondary | ICD-10-CM | POA: Diagnosis not present

## 2015-11-25 NOTE — Patient Instructions (Signed)
Dr Hochrein recommends that you follow-up with him as needed. 

## 2015-11-28 ENCOUNTER — Ambulatory Visit: Payer: Medicare Other | Admitting: Internal Medicine

## 2015-11-28 ENCOUNTER — Other Ambulatory Visit: Payer: Self-pay | Admitting: Internal Medicine

## 2015-12-06 ENCOUNTER — Encounter: Payer: Self-pay | Admitting: Cardiology

## 2015-12-07 ENCOUNTER — Other Ambulatory Visit: Payer: Self-pay | Admitting: Internal Medicine

## 2015-12-10 ENCOUNTER — Other Ambulatory Visit: Payer: Self-pay | Admitting: Internal Medicine

## 2015-12-19 ENCOUNTER — Encounter: Payer: Self-pay | Admitting: Internal Medicine

## 2015-12-19 ENCOUNTER — Ambulatory Visit (INDEPENDENT_AMBULATORY_CARE_PROVIDER_SITE_OTHER): Payer: Medicare Other | Admitting: Internal Medicine

## 2015-12-19 ENCOUNTER — Other Ambulatory Visit (INDEPENDENT_AMBULATORY_CARE_PROVIDER_SITE_OTHER): Payer: Medicare Other

## 2015-12-19 VITALS — BP 120/70 | HR 73 | Temp 98.3°F | Resp 16 | Ht 64.0 in | Wt 213.0 lb

## 2015-12-19 DIAGNOSIS — B351 Tinea unguium: Secondary | ICD-10-CM

## 2015-12-19 DIAGNOSIS — I10 Essential (primary) hypertension: Secondary | ICD-10-CM

## 2015-12-19 DIAGNOSIS — N184 Chronic kidney disease, stage 4 (severe): Secondary | ICD-10-CM

## 2015-12-19 DIAGNOSIS — J4520 Mild intermittent asthma, uncomplicated: Secondary | ICD-10-CM | POA: Diagnosis not present

## 2015-12-19 LAB — COMPREHENSIVE METABOLIC PANEL
ALBUMIN: 4.7 g/dL (ref 3.5–5.2)
ALT: 21 U/L (ref 0–35)
AST: 16 U/L (ref 0–37)
Alkaline Phosphatase: 55 U/L (ref 39–117)
BUN: 31 mg/dL — AB (ref 6–23)
CHLORIDE: 101 meq/L (ref 96–112)
CO2: 32 mEq/L (ref 19–32)
CREATININE: 1.5 mg/dL — AB (ref 0.40–1.20)
Calcium: 9.7 mg/dL (ref 8.4–10.5)
GFR: 36.39 mL/min — ABNORMAL LOW (ref >60.00)
GLUCOSE: 99 mg/dL (ref 70–99)
POTASSIUM: 4.2 meq/L (ref 3.5–5.1)
SODIUM: 142 meq/L (ref 135–145)
Total Bilirubin: 0.5 mg/dL (ref 0.2–1.2)
Total Protein: 7.4 g/dL (ref 6.0–8.3)

## 2015-12-19 MED ORDER — HYDROCODONE-HOMATROPINE 5-1.5 MG/5ML PO SYRP: 5.0000 mL | ORAL_SOLUTION | Freq: Three times a day (TID) | ORAL | Status: DC | PRN

## 2015-12-19 NOTE — Patient Instructions (Signed)
Hypertension Hypertension, commonly called high blood pressure, is when the force of blood pumping through your arteries is too strong. Your arteries are the blood vessels that carry blood from your heart throughout your body. A blood pressure reading consists of a higher number over a lower number, such as 110/72. The higher number (systolic) is the pressure inside your arteries when your heart pumps. The lower number (diastolic) is the pressure inside your arteries when your heart relaxes. Ideally you want your blood pressure below 120/80. Hypertension forces your heart to work harder to pump blood. Your arteries may become narrow or stiff. Having untreated or uncontrolled hypertension can cause heart attack, stroke, kidney disease, and other problems. RISK FACTORS Some risk factors for high blood pressure are controllable. Others are not.  Risk factors you cannot control include:   Race. You may be at higher risk if you are African American.  Age. Risk increases with age.  Gender. Men are at higher risk than women before age 45 years. After age 65, women are at higher risk than men. Risk factors you can control include:  Not getting enough exercise or physical activity.  Being overweight.  Getting too much fat, sugar, calories, or salt in your diet.  Drinking too much alcohol. SIGNS AND SYMPTOMS Hypertension does not usually cause signs or symptoms. Extremely high blood pressure (hypertensive crisis) may cause headache, anxiety, shortness of breath, and nosebleed. DIAGNOSIS To check if you have hypertension, your health care provider will measure your blood pressure while you are seated, with your arm held at the level of your heart. It should be measured at least twice using the same arm. Certain conditions can cause a difference in blood pressure between your right and left arms. A blood pressure reading that is higher than normal on one occasion does not mean that you need treatment. If  it is not clear whether you have high blood pressure, you may be asked to return on a different day to have your blood pressure checked again. Or, you may be asked to monitor your blood pressure at home for 1 or more weeks. TREATMENT Treating high blood pressure includes making lifestyle changes and possibly taking medicine. Living a healthy lifestyle can help lower high blood pressure. You may need to change some of your habits. Lifestyle changes may include:  Following the DASH diet. This diet is high in fruits, vegetables, and whole grains. It is low in salt, red meat, and added sugars.  Keep your sodium intake below 2,300 mg per day.  Getting at least 30-45 minutes of aerobic exercise at least 4 times per week.  Losing weight if necessary.  Not smoking.  Limiting alcoholic beverages.  Learning ways to reduce stress. Your health care provider may prescribe medicine if lifestyle changes are not enough to get your blood pressure under control, and if one of the following is true:  You are 18-59 years of age and your systolic blood pressure is above 140.  You are 60 years of age or older, and your systolic blood pressure is above 150.  Your diastolic blood pressure is above 90.  You have diabetes, and your systolic blood pressure is over 140 or your diastolic blood pressure is over 90.  You have kidney disease and your blood pressure is above 140/90.  You have heart disease and your blood pressure is above 140/90. Your personal target blood pressure may vary depending on your medical conditions, your age, and other factors. HOME CARE INSTRUCTIONS    Have your blood pressure rechecked as directed by your health care provider.   Take medicines only as directed by your health care provider. Follow the directions carefully. Blood pressure medicines must be taken as prescribed. The medicine does not work as well when you skip doses. Skipping doses also puts you at risk for  problems.  Do not smoke.   Monitor your blood pressure at home as directed by your health care provider. SEEK MEDICAL CARE IF:   You think you are having a reaction to medicines taken.  You have recurrent headaches or feel dizzy.  You have swelling in your ankles.  You have trouble with your vision. SEEK IMMEDIATE MEDICAL CARE IF:  You develop a severe headache or confusion.  You have unusual weakness, numbness, or feel faint.  You have severe chest or abdominal pain.  You vomit repeatedly.  You have trouble breathing. MAKE SURE YOU:   Understand these instructions.  Will watch your condition.  Will get help right away if you are not doing well or get worse.   This information is not intended to replace advice given to you by your health care provider. Make sure you discuss any questions you have with your health care provider.   Document Released: 09/03/2005 Document Revised: 01/18/2015 Document Reviewed: 06/26/2013 Elsevier Interactive Patient Education 2016 [employer] Inc[employer]  

## 2015-12-19 NOTE — Progress Notes (Signed)
Pre visit review using our clinic review tool, if applicable. No additional management support is needed unless otherwise documented below in the visit note. 

## 2015-12-19 NOTE — Progress Notes (Signed)
Subjective:  Patient ID: Leslie Edwards, female    DOB: 1946-03-26  Age: 70 y.o. MRN: [id number]  CC: Hypertension   HPI Leslie Edwards presents for follow-up after starting terbinafine for painful toenail onychomycosis. The pain in her left great toenail has resolved and there is a normal nail starting to grow at the base. She denies abd pain, nausea, rash, LOA, or any other side effects.  She also requests a refill on cough medicine. She uses inhalers for asthma and has occasional episode of cough and wheezing. Her cough is nonproductive and she denies chest pain or hemoptysis.   Outpatient Prescriptions Prior to Visit  Medication Sig Dispense Refill  . albuterol (PROVENTIL HFA;VENTOLIN HFA) 108 (90 BASE) MCG/ACT inhaler Inhale 2 puffs into the lungs every 6 (six) hours as needed.      . ALPRAZolam (XANAX) 0.25 MG tablet Take 1 tablet (0.25 mg total) by mouth 2 (two) times daily as needed for anxiety. 35 tablet 3  . budesonide-formoterol (SYMBICORT) 160-4.5 MCG/ACT inhaler Inhale 2 puffs into the lungs 2 (two) times daily.      Marland Kitchen buPROPion (WELLBUTRIN XL) 150 MG 24 hr tablet TAKE 2 TABLETS IN THE MORNING AND 1 TABLET IN THE EVENING. 90 tablet 11  . fexofenadine-pseudoephedrine (ALLEGRA-D) 60-120 MG per tablet Take 1 tablet by mouth daily as needed.      . furosemide (LASIX) 40 MG tablet TAKE 1 TABLET TWICE DAILY. 60 tablet 5  . HYDROcodone-acetaminophen (NORCO/VICODIN) 5-325 MG per tablet Take 1 tablet by mouth every 6 (six) hours as needed for moderate pain. 90 tablet 0  . hyoscyamine (LEVSIN SL) 0.125 MG SL tablet Place 1 tablet (0.125 mg total) under the tongue every 4 (four) hours as needed. 30 tablet 11  . ipratropium-albuterol (DUONEB) 0.5-2.5 (3) MG/3ML SOLN Take 3 mLs by nebulization as needed.      Marland Kitchen levothyroxine (SYNTHROID, LEVOTHROID) 75 MCG tablet TAKE 1 TABLET ONCE DAILY. 90 tablet 1  . liothyronine (CYTOMEL) 25 MCG tablet Take 1 tablet (25 mcg total) by mouth daily.  (Patient taking differently: Take 30 mcg by mouth daily. ) 90 tablet 1  . NASONEX 50 MCG/ACT nasal spray USE 2 SPRAYS IN EACH NOSTRIL ONCE DAILY 17 g 5  . omega-3 acid ethyl esters (LOVAZA) 1 g capsule Take 2 capsules (2 g total) by mouth 2 (two) times daily. 120 capsule 11  . Omeprazole-Sodium Bicarbonate (ZEGERID) 20-1100 MG CAPS Take 1 capsule by mouth at bedtime as needed.     . potassium chloride (K-DUR) 10 MEQ tablet TAKE 1 TABLET DAILY. 90 tablet 1  . terbinafine (LAMISIL) 250 MG tablet TAKE 1 TABLET ONCE DAILY. 30 tablet 5  . tretinoin (RETIN-A) 0.025 % cream     . Vilazodone HCl (VIIBRYD) 40 MG TABS Take 1 tablet (40 mg total) by mouth daily. 30 tablet 11   No facility-administered medications prior to visit.    ROS Review of Systems  Constitutional: Negative.  Negative for fever, chills, diaphoresis, appetite change and fatigue.  HENT: Negative.   Eyes: Negative.   Respiratory: Positive for cough and wheezing. Negative for choking, chest tightness, shortness of breath and stridor.        She has rare episodes NP cough and wheezing.  Cardiovascular: Negative.  Negative for chest pain, palpitations and leg swelling.  Gastrointestinal: Negative.  Negative for nausea, vomiting, abdominal pain, diarrhea, constipation and blood in stool.  Endocrine: Negative.   Genitourinary: Negative.   Musculoskeletal: Positive for back  pain.  Skin: Negative.   Allergic/Immunologic: Negative.   Neurological: Negative.  Negative for dizziness, syncope, light-headedness and numbness.  Hematological: Negative.  Negative for adenopathy. Does not bruise/bleed easily.  Psychiatric/Behavioral: Negative.     Objective:  BP 120/70 mmHg  Pulse 73  Temp(Src) 98.3 F (36.8 C) (Oral)  Ht 5\' 4"  (1.626 m)  Wt 213 lb (96.616 kg)  BMI 36.54 kg/m2  SpO2 97%  BP Readings from Last 3 Encounters:  12/19/15 120/70  11/25/15 150/84  10/31/15 120/70    Wt Readings from Last 3 Encounters:  12/19/15 213  lb (96.616 kg)  11/25/15 212 lb 3 oz (96.248 kg)  10/31/15 215 lb (97.523 kg)    Physical Exam  Constitutional: She is oriented to person, place, and time. No distress.  HENT:  Nose: Nose normal.  Mouth/Throat: Oropharynx is clear and moist. No oropharyngeal exudate.  Eyes: Conjunctivae are normal. Right eye exhibits no discharge. Left eye exhibits no discharge. No scleral icterus.  Neck: Normal range of motion. Neck supple. No JVD present. No tracheal deviation present. No thyromegaly present.  Cardiovascular: Normal rate, regular rhythm, normal heart sounds and intact distal pulses.  Exam reveals no gallop and no friction rub.   No murmur heard. Pulmonary/Chest: Effort normal and breath sounds normal. No stridor. No respiratory distress. She has no wheezes. She has no rales. She exhibits no tenderness.  Abdominal: Soft. Bowel sounds are normal. She exhibits no distension and no mass. There is no tenderness. There is no rebound and no guarding.  Musculoskeletal: Normal range of motion. She exhibits no edema or tenderness.  Lymphadenopathy:    She has no cervical adenopathy.  Neurological: She is oriented to person, place, and time.  Skin: Skin is warm and dry. No rash noted. She is not diaphoretic. No erythema. No pallor.  A normal toenail is starting to grow out at the base of the left great toe. Both great toes have a significant amount of subungual debris, nail thickening, and nail lysis.  Vitals reviewed.   Lab Results  Component Value Date   WBC 7.8 10/31/2015   HGB 13.4 10/31/2015   HCT 39.6 10/31/2015   PLT 276.0 10/31/2015   GLUCOSE 99 12/19/2015   CHOL 246* 10/31/2015   TRIG 265.0* 10/31/2015   HDL 84.40 10/31/2015   LDLDIRECT 111.0 10/31/2015   LDLCALC 104* 06/30/2015   ALT 21 12/19/2015   AST 16 12/19/2015   NA 142 12/19/2015   K 4.2 12/19/2015   CL 101 12/19/2015   CREATININE 1.50* 12/19/2015   BUN 31* 12/19/2015   CO2 32 12/19/2015   TSH 1.90 10/31/2015    HGBA1C 5.3 04/28/2009    Dg Bone Density  11/04/2015  Date of study: 11/03/2015 Exam: DUAL X-RAY ABSORPTIOMETRY (DXA) FOR BONE MINERAL DENSITY (BMD) Instrument: [employer] Requesting Provider: PCP Indication: follow up for osteoporosis Comparison: 07/31/2007 Clinical data: Pt is a postmenopausal 70 y.o. female without previous h/o fracture. On vitamin D. Results:  Lumbar spine (L1-L4) Femoral neck (FN) T-score  -0.1  RFN: -2.1 LFN: -2.0  Change in BMD from previous DXA test (%)  -1.6%   -13.8%* (*) statistically significant Assessment: the BMD is low according to the [facility] classification for osteoporosis (see below). Fracture risk: moderate FRAX score: 10 year major osteoporotic risk: 11.7 %. 10 year hip fracture risk: 2.4 %. These are under the thresholds for treatment of 20% and 3%, respectively. Comments: the technical quality of the study is good, however, the spine  is scoliotic and arthritic. Calcium accumulation in arthritic sites can confound the results of the bone density scan. Evaluation for secondary causes should be considered if clinically indicated. Recommend optimizing calcium (1200 mg/day) and vitamin D (800 IU/day) intake. Followup: Repeat BMD is appropriate after 2 years or after 1-2 years if starting treatment. WHO criteria for diagnosis of osteoporosis in postmenopausal women and in men 86 y/o or older: - normal: T-score -1.0 to + 1.0 - osteopenia/low bone density: T-score between -2.5 and -1.0 - osteoporosis: T-score below -2.5 - severe osteoporosis: T-score below -2.5 with history of fragility fracture Note: although not part of the WHO classification, the presence of a fragility fracture, regardless of the T-score, should be considered diagnostic of osteoporosis, provided other causes for the fracture have been excluded. Treatment: The National Osteoporosis Foundation recommends that treatment be considered in postmenopausal women and men age 20 or older with: 1. Hip or vertebral  (clinical or morphometric) fracture 2. T-score of - 2.5 or lower at the spine or hip 3. 10-year fracture probability by FRAX of at least 20% for a major osteoporotic fracture and 3% for a hip fracture Philemon Kingdom, MD [facility]    Assessment & Plan:   Leslie Edwards was seen today for hypertension.  Diagnoses and all orders for this visit:  Essential hypertension- blood pressure is well-controlled, electrolytes and renal function are stable. -     Comprehensive metabolic panel; Future  Kidney disease, chronic, stage IV (GFR 15-29 ml/min) (HCC)- her renal function is stable, she will continue to avoid nephrotoxic agents, will continue to achieve good blood pressure control. -     Comprehensive metabolic panel; Future  Onychomycosis of toenail- she has had no side effects and her liver enzymes are normal so asked her to continue terbinafine for an additional 3 months -     Comprehensive metabolic panel; Future  Asthma, intermittent, uncomplicated-  -     HYDROcodone-homatropine (HYCODAN) 5-1.5 MG/5ML syrup; Take 5 mLs by mouth every 8 (eight) hours as needed for cough.   I am having Leslie Edwards start on HYDROcodone-homatropine. I am also having her maintain her albuterol, budesonide-formoterol, fexofenadine-pseudoephedrine, ipratropium-albuterol, Omeprazole-Sodium Bicarbonate, tretinoin, hyoscyamine, HYDROcodone-acetaminophen, potassium chloride, buPROPion, levothyroxine, ALPRAZolam, Vilazodone HCl, omega-3 acid ethyl esters, liothyronine, furosemide, terbinafine, and NASONEX.  Meds ordered this encounter  Medications  . HYDROcodone-homatropine (HYCODAN) 5-1.5 MG/5ML syrup    Sig: Take 5 mLs by mouth every 8 (eight) hours as needed for cough.    Dispense:  120 mL    Refill:  0     Follow-up: Return in about 6 months (around 06/19/2016).  Scarlette Calico, MD

## 2015-12-21 ENCOUNTER — Ambulatory Visit (INDEPENDENT_AMBULATORY_CARE_PROVIDER_SITE_OTHER): Payer: Medicare Other | Admitting: Nurse Practitioner

## 2015-12-21 ENCOUNTER — Other Ambulatory Visit: Payer: Self-pay | Admitting: Nurse Practitioner

## 2015-12-21 ENCOUNTER — Encounter: Payer: Self-pay | Admitting: Internal Medicine

## 2015-12-21 ENCOUNTER — Encounter: Payer: Self-pay | Admitting: Nurse Practitioner

## 2015-12-21 ENCOUNTER — Ambulatory Visit (INDEPENDENT_AMBULATORY_CARE_PROVIDER_SITE_OTHER)
Admission: RE | Admit: 2015-12-21 | Discharge: 2015-12-21 | Disposition: A | Discharge status: Home / Self Care | Payer: Medicare Other | Source: Ambulatory Visit | Attending: Nurse Practitioner | Admitting: Nurse Practitioner

## 2015-12-21 VITALS — BP 144/72 | HR 81 | Temp 98.2°F | Resp 18 | Ht 64.0 in | Wt 220.0 lb

## 2015-12-21 DIAGNOSIS — M25571 Pain in right ankle and joints of right foot: Secondary | ICD-10-CM

## 2015-12-21 DIAGNOSIS — M7731 Calcaneal spur, right foot: Secondary | ICD-10-CM

## 2015-12-21 DIAGNOSIS — M25579 Pain in unspecified ankle and joints of unspecified foot: Secondary | ICD-10-CM | POA: Insufficient documentation

## 2015-12-21 DIAGNOSIS — L718 Other rosacea: Secondary | ICD-10-CM | POA: Insufficient documentation

## 2015-12-21 DIAGNOSIS — H2513 Age-related nuclear cataract, bilateral: Secondary | ICD-10-CM | POA: Diagnosis not present

## 2015-12-21 NOTE — Progress Notes (Signed)
Pre visit review using our clinic review tool, if applicable. No additional management support is needed unless otherwise documented below in the visit note. 

## 2015-12-21 NOTE — Progress Notes (Signed)
Patient ID: Leslie Edwards, female    DOB: 1946-03-16  Age: 70 y.o. MRN: [id number]  CC: Acute Visit   HPI Leslie Edwards presents for CC of right foot pain x 1 day.   1) Pt report right foot pain with past history of plantar fascitis of left foot.   She reports icing it, stretching the foot and soaking in warm water Vicodin last night was somewhat helpful   Aggravated by weight on the foot and walking   Using a pair of crutches she has had for a long time to help walk   Patient has poor Kidney function- no NSAIDs   She reports the pain as a severe ache that with weight bearing shoots up her right leg to her ankle and maybe above  Denies trauma of any kind even mild    History Dejiah has a past medical history of Allergy; Anxiety; Asthma; Hypertension; GERD (gastroesophageal reflux disease); Hypothyroidism; [place]; VENOUS INSUFFICIENCY, CHRONIC (04/28/2007); Parathyroid hormone excess ([employer]) (08/31/2013); OSTEOPOROSIS (05/06/2007); PLANTAR FASCIITIS, LEFT (07/05/2009); Rosacea (02/25/2009); ANEMIA, B12 DEFICIENCY (01/28/2009); and UNSPECIFIED IRON DEFICIENCY ANEMIA (01/28/2009).   She has past surgical history that includes Carpal tunnel release; Tonsillectomy; nissan fundiplication; Knee arthroscopy; and Lipoma excision (1965).   Her family history includes Arthritis in her father; Cancer in her maternal aunt, maternal grandfather, maternal uncle, mother, and paternal grandmother; Dementia in her maternal grandmother; Healthy in her brother and brother; Lymphoma in her son; Macular degeneration in her father; Stroke in her maternal grandmother.She reports that she quit smoking about 40 years ago. She has never used smokeless tobacco. She reports that she drinks about 1.8 oz of alcohol per week. She reports that she does not use illicit drugs.  Outpatient Prescriptions Prior to Visit  Medication Sig Dispense Refill  . albuterol (PROVENTIL HFA;VENTOLIN HFA) 108 (90 BASE) MCG/ACT inhaler  Inhale 2 puffs into the lungs every 6 (six) hours as needed.      . ALPRAZolam (XANAX) 0.25 MG tablet Take 1 tablet (0.25 mg total) by mouth 2 (two) times daily as needed for anxiety. 35 tablet 3  . budesonide-formoterol (SYMBICORT) 160-4.5 MCG/ACT inhaler Inhale 2 puffs into the lungs 2 (two) times daily.      Marland Kitchen buPROPion (WELLBUTRIN XL) 150 MG 24 hr tablet TAKE 2 TABLETS IN THE MORNING AND 1 TABLET IN THE EVENING. 90 tablet 11  . fexofenadine-pseudoephedrine (ALLEGRA-D) 60-120 MG per tablet Take 1 tablet by mouth daily as needed.      . furosemide (LASIX) 40 MG tablet TAKE 1 TABLET TWICE DAILY. 60 tablet 5  . HYDROcodone-acetaminophen (NORCO/VICODIN) 5-325 MG per tablet Take 1 tablet by mouth every 6 (six) hours as needed for moderate pain. 90 tablet 0  . hyoscyamine (LEVSIN SL) 0.125 MG SL tablet Place 1 tablet (0.125 mg total) under the tongue every 4 (four) hours as needed. 30 tablet 11  . ipratropium-albuterol (DUONEB) 0.5-2.5 (3) MG/3ML SOLN Take 3 mLs by nebulization as needed.      Marland Kitchen levothyroxine (SYNTHROID, LEVOTHROID) 75 MCG tablet TAKE 1 TABLET ONCE DAILY. 90 tablet 1  . liothyronine (CYTOMEL) 25 MCG tablet Take 1 tablet (25 mcg total) by mouth daily. (Patient taking differently: Take 30 mcg by mouth daily. ) 90 tablet 1  . NASONEX 50 MCG/ACT nasal spray USE 2 SPRAYS IN EACH NOSTRIL ONCE DAILY 17 g 5  . omega-3 acid ethyl esters (LOVAZA) 1 g capsule Take 2 capsules (2 g total) by mouth 2 (two) times daily. 120 capsule  11  . Omeprazole-Sodium Bicarbonate (ZEGERID) 20-1100 MG CAPS Take 1 capsule by mouth at bedtime as needed.     . potassium chloride (K-DUR) 10 MEQ tablet TAKE 1 TABLET DAILY. 90 tablet 1  . terbinafine (LAMISIL) 250 MG tablet TAKE 1 TABLET ONCE DAILY. 30 tablet 5  . Vilazodone HCl (VIIBRYD) 40 MG TABS Take 1 tablet (40 mg total) by mouth daily. 30 tablet 11  . tretinoin (RETIN-A) 0.025 % cream Reported on 12/21/2015    . HYDROcodone-homatropine (HYCODAN) 5-1.5 MG/5ML syrup  Take 5 mLs by mouth every 8 (eight) hours as needed for cough. (Patient not taking: Reported on 12/21/2015) 120 mL 0   No facility-administered medications prior to visit.    ROS Review of Systems  Constitutional: Negative for fever, chills, diaphoresis and fatigue.  Respiratory: Negative for chest tightness, shortness of breath and wheezing.   Cardiovascular: Negative for chest pain, palpitations and leg swelling.  Gastrointestinal: Negative for nausea, vomiting and diarrhea.  Musculoskeletal: Positive for myalgias and gait problem.  Skin: Negative for rash.  Neurological: Negative for dizziness, weakness, numbness and headaches.  Psychiatric/Behavioral: The patient is not nervous/anxious.     Objective:  BP 144/72 mmHg  Pulse 81  Temp(Src) 98.2 F (36.8 C) (Oral)  Resp 18  Ht 5\' 4"  (1.626 m)  Wt 220 lb (99.791 kg)  BMI 37.74 kg/m2  SpO2 97%  Physical Exam  Constitutional: She is oriented to person, place, and time. She appears well-developed and well-nourished. No distress.  HENT:  Head: Normocephalic and atraumatic.  Right Ear: External ear normal.  Left Ear: External ear normal.  Cardiovascular: Normal rate and regular rhythm.  Exam reveals no gallop and no friction rub.   Murmur heard. Pulmonary/Chest: Effort normal and breath sounds normal. No respiratory distress. She has no wheezes. She has no rales. She exhibits no tenderness.  Musculoskeletal: Normal range of motion. She exhibits edema and tenderness.  Slight swelling of right foot, 1+ pedal pulse, brisk cap refill, ROM without weight bearing is normal, tenderness of arch with palpation  Neurological: She is alert and oriented to person, place, and time.  Skin: Skin is warm and dry. No rash noted. She is not diaphoretic.  Psychiatric: She has a normal mood and affect. Her behavior is normal. Judgment and thought content normal.   Assessment & Plan:   [name] was seen today for acute visit.  Diagnoses and all  orders for this visit:  Pain in joint, ankle and foot, right -     DG Foot Complete Right; Future  I have discontinued Ms. Grisso's HYDROcodone-homatropine. I am also having her maintain her albuterol, budesonide-formoterol, fexofenadine-pseudoephedrine, ipratropium-albuterol, Omeprazole-Sodium Bicarbonate, tretinoin, hyoscyamine, HYDROcodone-acetaminophen, potassium chloride, buPROPion, levothyroxine, ALPRAZolam, Vilazodone HCl, omega-3 acid ethyl esters, liothyronine, furosemide, terbinafine, and NASONEX.  No orders of the defined types were placed in this encounter.     Follow-up: Return if symptoms worsen or fail to improve.

## 2015-12-21 NOTE — Patient Instructions (Signed)
We can refer you to [facility] if no findings- this is located on [address] and on [address]- two locations.   Please visit downstairs to get your right foot x-ray   Freeze water bottles and roll foot over them 2-3 x a day for help with pain.

## 2015-12-21 NOTE — Assessment & Plan Note (Signed)
X-ray today Supportive care- ice and stretching Crutch use  May refer to [place] [facility] prn worsening/failure to improve.

## 2016-01-05 ENCOUNTER — Ambulatory Visit (INDEPENDENT_AMBULATORY_CARE_PROVIDER_SITE_OTHER): Payer: Medicare Other | Admitting: Podiatry

## 2016-01-05 ENCOUNTER — Ambulatory Visit: Payer: Self-pay

## 2016-01-05 ENCOUNTER — Encounter: Payer: Self-pay | Admitting: Podiatry

## 2016-01-05 VITALS — BP 120/74 | HR 72 | Resp 12

## 2016-01-05 DIAGNOSIS — M722 Plantar fascial fibromatosis: Secondary | ICD-10-CM

## 2016-01-05 MED ORDER — MELOXICAM 15 MG PO TABS: 15.0000 mg | ORAL_TABLET | Freq: Every day | ORAL | Status: DC

## 2016-01-05 MED ORDER — METHYLPREDNISOLONE 4 MG PO TBPK: ORAL_TABLET | ORAL | Status: DC

## 2016-01-05 NOTE — Progress Notes (Signed)
   Subjective:    Patient ID: Leslie Edwards, female    DOB: 06-12-1946, 70 y.o.   MRN: [id number]  HPI: She presents today with a 3 week duration of pain to the plantar aspect of the bilateral foot. Right seemed to be worse than the left. States the morning pain seems to be worse. Denies any trauma and has done nothing to try to help her feet.    Review of Systems  Constitutional: Positive for fatigue and unexpected weight change.  Hematological: Bruises/bleeds easily.       Objective:   Physical Exam: Vital signs are stable alert and oriented 3 pulses are palpable. Neurologic sensorium is intact per Semmes-Weinstein monofilament. Deep tendon reflexes are intact. Muscle strength +5 over 5 dorsiflexion and plantar flexors and inverters everters all intrinsic musculature is intact. Orthopedic evaluation demonstrates all joints distal to the ankle for range of motion without crepitation. Cutaneous evaluation demonstrates supple well-hydrated cutis no erythema edema and cellulitis drainage or odor. Currently she has pain on palpation medial calcaneal tubercle bilaterally right greater than left. Radiograph confirms soft tissue increase in density of plantar fascial calcaneal insertion sites.        Assessment & Plan:  Assessment: Plantar fasciitis right greater than left.  Plan: Started her on a Medrol Dosepak to be followed by meloxicam. Injected 20 mg of Kenalog to the bilateral heels today with local anesthetic. Placed her into bilateral plantar fascial brace and a single night splint. Discussed appropriate shoe gear stretching exercises ice therapy and shoe gear modifications. I will follow-up with her in 1 month.

## 2016-01-05 NOTE — Patient Instructions (Signed)

## 2016-01-19 ENCOUNTER — Other Ambulatory Visit: Payer: Self-pay | Admitting: Internal Medicine

## 2016-01-20 ENCOUNTER — Telehealth: Payer: Self-pay

## 2016-01-20 NOTE — Telephone Encounter (Signed)
APPROVED through 01/19/2017

## 2016-01-20 NOTE — Telephone Encounter (Signed)
PA initiated via CoverMyMeds key [id number]

## 2016-02-02 ENCOUNTER — Ambulatory Visit: Payer: Medicare Other | Admitting: Podiatry

## 2016-02-07 ENCOUNTER — Other Ambulatory Visit: Payer: Self-pay | Admitting: Internal Medicine

## 2016-03-08 ENCOUNTER — Ambulatory Visit: Payer: Medicare Other | Admitting: Podiatry

## 2016-05-03 ENCOUNTER — Ambulatory Visit: Payer: Medicare Other | Admitting: Podiatry

## 2016-05-11 ENCOUNTER — Other Ambulatory Visit: Payer: Self-pay | Admitting: Internal Medicine

## 2016-05-11 DIAGNOSIS — E038 Other specified hypothyroidism: Secondary | ICD-10-CM

## 2016-05-11 NOTE — Telephone Encounter (Signed)
rx for 5 mg of cytomel was denied. This strength of medication is not on patients med list.

## 2016-05-14 DIAGNOSIS — L718 Other rosacea: Secondary | ICD-10-CM | POA: Diagnosis not present

## 2016-05-14 DIAGNOSIS — H2513 Age-related nuclear cataract, bilateral: Secondary | ICD-10-CM | POA: Diagnosis not present

## 2016-05-16 ENCOUNTER — Other Ambulatory Visit: Payer: Self-pay | Admitting: Internal Medicine

## 2016-05-31 ENCOUNTER — Other Ambulatory Visit: Payer: Self-pay | Admitting: Internal Medicine

## 2016-06-12 ENCOUNTER — Ambulatory Visit (INDEPENDENT_AMBULATORY_CARE_PROVIDER_SITE_OTHER): Payer: Medicare Other

## 2016-06-12 DIAGNOSIS — Z23 Encounter for immunization: Secondary | ICD-10-CM | POA: Diagnosis not present

## 2016-06-18 ENCOUNTER — Ambulatory Visit: Payer: Medicare Other | Admitting: Internal Medicine

## 2016-06-22 ENCOUNTER — Other Ambulatory Visit: Payer: Self-pay | Admitting: Internal Medicine

## 2016-07-26 ENCOUNTER — Other Ambulatory Visit: Payer: Self-pay | Admitting: Internal Medicine

## 2016-07-30 ENCOUNTER — Ambulatory Visit (INDEPENDENT_AMBULATORY_CARE_PROVIDER_SITE_OTHER)
Admission: RE | Admit: 2016-07-30 | Discharge: 2016-07-30 | Disposition: A | Discharge status: Home / Self Care | Payer: Medicare Other | Source: Ambulatory Visit | Attending: Internal Medicine | Admitting: Internal Medicine

## 2016-07-30 ENCOUNTER — Ambulatory Visit (INDEPENDENT_AMBULATORY_CARE_PROVIDER_SITE_OTHER): Payer: Medicare Other | Admitting: Internal Medicine

## 2016-07-30 ENCOUNTER — Encounter: Payer: Self-pay | Admitting: Internal Medicine

## 2016-07-30 ENCOUNTER — Other Ambulatory Visit (INDEPENDENT_AMBULATORY_CARE_PROVIDER_SITE_OTHER): Payer: Medicare Other

## 2016-07-30 VITALS — BP 130/82 | HR 76 | Temp 97.9°F | Ht 64.0 in | Wt 214.0 lb

## 2016-07-30 DIAGNOSIS — E038 Other specified hypothyroidism: Secondary | ICD-10-CM | POA: Diagnosis not present

## 2016-07-30 DIAGNOSIS — E213 Hyperparathyroidism, unspecified: Secondary | ICD-10-CM

## 2016-07-30 DIAGNOSIS — I10 Essential (primary) hypertension: Secondary | ICD-10-CM

## 2016-07-30 DIAGNOSIS — M47812 Spondylosis without myelopathy or radiculopathy, cervical region: Secondary | ICD-10-CM | POA: Diagnosis not present

## 2016-07-30 DIAGNOSIS — N184 Chronic kidney disease, stage 4 (severe): Secondary | ICD-10-CM | POA: Diagnosis not present

## 2016-07-30 DIAGNOSIS — M5412 Radiculopathy, cervical region: Secondary | ICD-10-CM | POA: Diagnosis not present

## 2016-07-30 DIAGNOSIS — J4520 Mild intermittent asthma, uncomplicated: Secondary | ICD-10-CM

## 2016-07-30 DIAGNOSIS — M48062 Spinal stenosis, lumbar region with neurogenic claudication: Secondary | ICD-10-CM

## 2016-07-30 LAB — CBC WITH DIFFERENTIAL/PLATELET
BASOS ABS: 0.1 10*3/uL (ref 0.0–0.1)
BASOS PCT: 0.8 % (ref 0.0–3.0)
Eosinophils Absolute: 0.3 10*3/uL (ref 0.0–0.7)
Eosinophils Relative: 4.6 % (ref 0.0–5.0)
HCT: 39.8 % (ref 36.0–46.0)
Hemoglobin: 13.7 g/dL (ref 12.0–15.0)
LYMPHS ABS: 1.7 10*3/uL (ref 0.7–4.0)
Lymphocytes Relative: 28 % (ref 12.0–46.0)
MCHC: 34.4 g/dL (ref 30.0–36.0)
MCV: 98.1 fl (ref 78.0–100.0)
MONOS PCT: 10.5 % (ref 3.0–12.0)
Monocytes Absolute: 0.6 10*3/uL (ref 0.1–1.0)
NEUTROS ABS: 3.4 10*3/uL (ref 1.4–7.7)
NEUTROS PCT: 56.1 % (ref 43.0–77.0)
PLATELETS: 254 10*3/uL (ref 150.0–400.0)
RBC: 4.06 Mil/uL (ref 3.87–5.11)
RDW: 13.6 % (ref 11.5–15.5)
WBC: 6 10*3/uL (ref 4.0–10.5)

## 2016-07-30 LAB — COMPREHENSIVE METABOLIC PANEL
ALT: 21 U/L (ref 0–35)
AST: 17 U/L (ref 0–37)
Albumin: 4.7 g/dL (ref 3.5–5.2)
Alkaline Phosphatase: 59 U/L (ref 39–117)
BILIRUBIN TOTAL: 0.5 mg/dL (ref 0.2–1.2)
BUN: 28 mg/dL — ABNORMAL HIGH (ref 6–23)
CHLORIDE: 103 meq/L (ref 96–112)
CO2: 30 meq/L (ref 19–32)
Calcium: 9.8 mg/dL (ref 8.4–10.5)
Creatinine, Ser: 1.22 mg/dL — ABNORMAL HIGH (ref 0.40–1.20)
GFR: 46.11 mL/min — AB (ref >60.00)
GLUCOSE: 107 mg/dL — AB (ref 70–99)
Potassium: 4.7 mEq/L (ref 3.5–5.1)
Sodium: 140 mEq/L (ref 135–145)
Total Protein: 7.2 g/dL (ref 6.0–8.3)

## 2016-07-30 MED ORDER — BUDESONIDE-FORMOTEROL FUMARATE 160-4.5 MCG/ACT IN AERO: 2.0000 | INHALATION_SPRAY | Freq: Two times a day (BID) | RESPIRATORY_TRACT | 11 refills | Status: DC

## 2016-07-30 MED ORDER — OXYCODONE HCL 5 MG PO TABS: 5.0000 mg | ORAL_TABLET | Freq: Four times a day (QID) | ORAL | 0 refills | Status: DC | PRN

## 2016-07-30 NOTE — Progress Notes (Signed)
Pre visit review using our clinic review tool, if applicable. No additional management support is needed unless otherwise documented below in the visit note. 

## 2016-07-30 NOTE — Patient Instructions (Signed)

## 2016-07-30 NOTE — Progress Notes (Signed)
Subjective:  Patient ID: Leslie Edwards, female    DOB: 01/07/1946  Age: 70 y.o. MRN: [id number]  CC: Cough; Hypothyroidism; Hypertension; and Back Pain   HPI Jalah H Hewlett presents for Multiple concerns.  She complains of chronic, intermittent nonproductive cough and wheezing. For some reason she has not been using Symbicort recently.  She also complains of a several week history of right lower sided neck pain that radiates into her right upper extremity with numbness and tingling in her right upper extremity. She has gotten some symptom relief with meloxicam and hydrocodone/Tylenol but tells me that she has pain that keeps her awake at night and she would like to try something stronger than hydrocodone for pain relief.  Outpatient Medications Prior to Visit  Medication Sig Dispense Refill  . albuterol (PROVENTIL HFA;VENTOLIN HFA) 108 (90 BASE) MCG/ACT inhaler Inhale 2 puffs into the lungs every 6 (six) hours as needed.      . ALPRAZolam (XANAX) 0.25 MG tablet Take 1 tablet (0.25 mg total) by mouth 2 (two) times daily as needed for anxiety. 35 tablet 3  . buPROPion (WELLBUTRIN XL) 150 MG 24 hr tablet TAKE 2 TABLETS IN THE MORNING AND 1 TABLET IN THE EVENING. 90 tablet 5  . fexofenadine-pseudoephedrine (ALLEGRA-D) 60-120 MG per tablet Take 1 tablet by mouth daily as needed.      . furosemide (LASIX) 40 MG tablet TAKE 1 TABLET TWICE DAILY. 60 tablet 11  . hyoscyamine (LEVSIN SL) 0.125 MG SL tablet Place 1 tablet (0.125 mg total) under the tongue every 4 (four) hours as needed. 30 tablet 11  . ipratropium-albuterol (DUONEB) 0.5-2.5 (3) MG/3ML SOLN Take 3 mLs by nebulization as needed.      Marland Kitchen liothyronine (CYTOMEL) 25 MCG tablet Take 1 tablet (25 mcg total) by mouth daily. 90 tablet 1  . liothyronine (CYTOMEL) 5 MCG tablet TAKE 1 TABLET ONCE DAILY. 90 tablet 1  . meloxicam (MOBIC) 15 MG tablet Take 1 tablet (15 mg total) by mouth daily. 30 tablet 3  . NASONEX 50 MCG/ACT nasal spray USE 2  SPRAYS IN EACH NOSTRIL ONCE DAILY 17 g 5  . omega-3 acid ethyl esters (LOVAZA) 1 g capsule Take 2 capsules (2 g total) by mouth 2 (two) times daily. 120 capsule 11  . Omeprazole-Sodium Bicarbonate (ZEGERID) 20-1100 MG CAPS Take 1 capsule by mouth at bedtime as needed.     . potassium chloride (K-DUR) 10 MEQ tablet TAKE 1 TABLET DAILY. 90 tablet 1  . tretinoin (RETIN-A) 0.025 % cream Reported on 12/21/2015    . Vilazodone HCl (VIIBRYD) 40 MG TABS Take 1 tablet (40 mg total) by mouth daily. 30 tablet 11  . budesonide-formoterol (SYMBICORT) 160-4.5 MCG/ACT inhaler Inhale 2 puffs into the lungs 2 (two) times daily.      Marland Kitchen levothyroxine (SYNTHROID, LEVOTHROID) 75 MCG tablet TAKE 1 TABLET ONCE DAILY. 90 tablet 0  . terbinafine (LAMISIL) 250 MG tablet TAKE 1 TABLET ONCE DAILY. 30 tablet 5  . methylPREDNISolone (MEDROL) 4 MG TBPK tablet Tapering 6 day dose pack 21 tablet 0  . potassium chloride (K-DUR) 10 MEQ tablet TAKE 1 TABLET DAILY. 90 tablet 0   No facility-administered medications prior to visit.     ROS Review of Systems  Constitutional: Negative.  Negative for activity change, appetite change, chills, diaphoresis, fatigue, fever and unexpected weight change.  HENT: Negative.   Eyes: Negative.   Respiratory: Positive for cough and wheezing. Negative for choking, chest tightness, shortness of breath and  stridor.   Cardiovascular: Negative.  Negative for chest pain and leg swelling.  Gastrointestinal: Negative.  Negative for abdominal pain, constipation, diarrhea, nausea and vomiting.  Endocrine: Negative.  Negative for cold intolerance and heat intolerance.  Genitourinary: Negative.  Negative for difficulty urinating.  Musculoskeletal: Positive for back pain and neck pain. Negative for arthralgias, joint swelling, myalgias and neck stiffness.  Skin: Negative.   Neurological: Positive for numbness. Negative for weakness.  Hematological: Negative.  Negative for adenopathy. Does not bruise/bleed  easily.  Psychiatric/Behavioral: Negative for confusion, decreased concentration, dysphoric mood, sleep disturbance and suicidal ideas. The patient is nervous/anxious.     Objective:  BP 130/82 (BP Location: Left Arm, Patient Position: Sitting, Cuff Size: Normal)   Pulse 76   Temp 97.9 F (36.6 C) (Oral)   Ht 5\' 4"  (1.626 m)   Wt 214 lb (97.1 kg)   SpO2 98%   BMI 36.73 kg/m   BP Readings from Last 3 Encounters:  07/30/16 130/82  01/05/16 120/74  12/21/15 (!) 144/72    Wt Readings from Last 3 Encounters:  07/30/16 214 lb (97.1 kg)  12/21/15 220 lb (99.8 kg)  12/19/15 213 lb (96.6 kg)    Physical Exam  Constitutional: No distress.  HENT:  Mouth/Throat: Oropharynx is clear and moist. No oropharyngeal exudate.  Eyes: Conjunctivae are normal. Right eye exhibits no discharge. Left eye exhibits no discharge. No scleral icterus.  Neck: Normal range of motion. Neck supple. No JVD present. No tracheal deviation present. No thyromegaly present.  Cardiovascular: Normal rate, regular rhythm, normal heart sounds and intact distal pulses.  Exam reveals no gallop and no friction rub.   No murmur heard. Pulmonary/Chest: Effort normal and breath sounds normal. No stridor. No respiratory distress. She has no wheezes. She has no rales. She exhibits no tenderness.  Abdominal: Soft. Bowel sounds are normal. She exhibits no distension and no mass. There is no tenderness. There is no rebound and no guarding.  Musculoskeletal: Normal range of motion. She exhibits no edema, tenderness or deformity.       Cervical back: Normal. She exhibits normal range of motion, no tenderness, no bony tenderness, no swelling, no edema, no deformity and no pain.  Lymphadenopathy:    She has no cervical adenopathy.  Neurological: She displays abnormal reflex. She displays no atrophy and no tremor. No cranial nerve deficit or sensory deficit. She exhibits abnormal muscle tone. She displays a negative Romberg sign.  Coordination and gait normal.  Reflex Scores:      Tricep reflexes are 0 on the right side and 1+ on the left side.      Bicep reflexes are 2+ on the right side and 1+ on the left side.      Brachioradialis reflexes are 2+ on the right side and 1+ on the left side.      Patellar reflexes are 1+ on the right side and 1+ on the left side.      Achilles reflexes are 1+ on the right side and 1+ on the left side. There is mild weakness in her right hand  Skin: Skin is warm and dry. No rash noted. She is not diaphoretic. No erythema. No pallor.  Psychiatric: Her behavior is normal. Judgment and thought content normal.  Vitals reviewed.   Lab Results  Component Value Date   WBC 6.0 07/30/2016   HGB 13.7 07/30/2016   HCT 39.8 07/30/2016   PLT 254.0 07/30/2016   GLUCOSE 107 (H) 07/30/2016   CHOL 246 (  H) 10/31/2015   TRIG 265.0 (H) 10/31/2015   HDL 84.40 10/31/2015   LDLDIRECT 111.0 10/31/2015   LDLCALC 104 (H) 06/30/2015   ALT 21 07/30/2016   AST 17 07/30/2016   NA 140 07/30/2016   K 4.7 07/30/2016   CL 103 07/30/2016   CREATININE 1.22 (H) 07/30/2016   BUN 28 (H) 07/30/2016   CO2 30 07/30/2016   TSH 0.30 (L) 07/30/2016   HGBA1C 5.3 04/28/2009    Dg Foot Complete Right  Result Date: 12/21/2015 CLINICAL DATA:  Right foot pain and swelling in the arch for 2 days, no known injury EXAM: RIGHT FOOT COMPLETE - 3+ VIEW COMPARISON:  None. FINDINGS: Three views of the right foot submitted. No acute fracture or subluxation. There is large plantar spur of calcaneus. Small posterior spur of calcaneus. Spurring at the base of fifth metatarsal. IMPRESSION: No acute fracture or subluxation. Large plantar spur of calcaneus is noted. Small posterior spur of calcaneus. Electronically Signed   By: Lahoma Crocker M.D.   On: 12/21/2015 09:25    Assessment & Plan:   Luberta was seen today for cough, hypothyroidism, hypertension and back pain.  Diagnoses and all orders for this visit:  Essential  hypertension- her blood pressure is adequately well controlled, electrolytes and renal function are stable. -     Comprehensive metabolic panel; Future -     CBC with Differential/Platelet; Future  Kidney disease, chronic, stage IV (GFR 15-29 ml/min) (HCC)- her renal function is stable -     Comprehensive metabolic panel; Future -     CBC with Differential/Platelet; Future  Parathyroid hormone excess ([place])- I will monitor her calcium and parathyroid hormone level. -     Comprehensive metabolic panel; Future -     PTH, intact and calcium; Future  Other specified hypothyroidism- her TSH is suppressed so I've asked her to decrease her levothyroxine dose from 75 g a day to 50 g a day. -     Thyroid Panel With TSH; Future -     levothyroxine (SYNTHROID, LEVOTHROID) 50 MCG tablet; Take 1 tablet (50 mcg total) by mouth daily.  Mild intermittent asthma without complication- she has chronic, recurrent symptoms and a recent exacerbation. I've asked her to restart Symbicort and will also try a short course of systemic steroids. -     budesonide-formoterol (SYMBICORT) 160-4.5 MCG/ACT inhaler; Inhale 2 puffs into the lungs 2 (two) times daily. -     methylPREDNISolone (MEDROL DOSEPAK) 4 MG TBPK tablet; TAKE AS DIRECTED  Radiculitis of right cervical region- plain films are positive for a significant amount of DDD, she has abnormal reflexes in her right upper extremity, I'm concerned she may have spinal stenosis, nerve impingement, or tumor. I have ordered an MRI to get a better picture of her cervical spine. For now, will attempt to get better control of her pain with oxycodone and will also also try a short course of systemic steroids. -     DG Cervical Spine Complete; Future -     MR CERVICAL SPINE WO CONTRAST; Future -     oxyCODONE (OXY IR/ROXICODONE) 5 MG immediate release tablet; Take 1 tablet (5 mg total) by mouth every 6 (six) hours as needed for severe pain. -     methylPREDNISolone (MEDROL  DOSEPAK) 4 MG TBPK tablet; TAKE AS DIRECTED  Spinal stenosis, lumbar region, with neurogenic claudication- she tells me her back pain is stable. -     oxyCODONE (OXY IR/ROXICODONE) 5 MG immediate release tablet;  Take 1 tablet (5 mg total) by mouth every 6 (six) hours as needed for severe pain.   I have discontinued Ms. Dunshee's terbinafine, methylPREDNISolone, and levothyroxine. I am also having her start on oxyCODONE, levothyroxine, and methylPREDNISolone. Additionally, I am having her maintain her albuterol, fexofenadine-pseudoephedrine, ipratropium-albuterol, Omeprazole-Sodium Bicarbonate, tretinoin, hyoscyamine, potassium chloride, ALPRAZolam, Vilazodone HCl, omega-3 acid ethyl esters, NASONEX, meloxicam, liothyronine, liothyronine, buPROPion, furosemide, and budesonide-formoterol.  Meds ordered this encounter  Medications  . budesonide-formoterol (SYMBICORT) 160-4.5 MCG/ACT inhaler    Sig: Inhale 2 puffs into the lungs 2 (two) times daily.    Dispense:  1 Inhaler    Refill:  11  . oxyCODONE (OXY IR/ROXICODONE) 5 MG immediate release tablet    Sig: Take 1 tablet (5 mg total) by mouth every 6 (six) hours as needed for severe pain.    Dispense:  90 tablet    Refill:  0  . levothyroxine (SYNTHROID, LEVOTHROID) 50 MCG tablet    Sig: Take 1 tablet (50 mcg total) by mouth daily.    Dispense:  90 tablet    Refill:  1  . methylPREDNISolone (MEDROL DOSEPAK) 4 MG TBPK tablet    Sig: TAKE AS DIRECTED    Dispense:  21 tablet    Refill:  0     Follow-up: Return in about 4 weeks (around 08/27/2016).  Scarlette Calico, MD

## 2016-07-31 ENCOUNTER — Encounter: Payer: Self-pay | Admitting: Internal Medicine

## 2016-07-31 LAB — PTH, INTACT AND CALCIUM
Calcium: 9.9 mg/dL (ref 8.6–10.4)
PTH: 63 pg/mL (ref 14–64)

## 2016-07-31 LAB — THYROID PANEL WITH TSH
FREE THYROXINE INDEX: 1.2 — AB (ref 1.4–3.8)
T3 Uptake: 32 % (ref 22–35)
T4, Total: 3.9 ug/dL — ABNORMAL LOW (ref 4.5–12.0)
TSH: 0.3 mIU/L — ABNORMAL LOW

## 2016-07-31 MED ORDER — LEVOTHYROXINE SODIUM 50 MCG PO TABS: 50.0000 ug | ORAL_TABLET | Freq: Every day | ORAL | 1 refills | Status: DC

## 2016-07-31 MED ORDER — METHYLPREDNISOLONE 4 MG PO TBPK: ORAL_TABLET | ORAL | 0 refills | Status: DC

## 2016-08-13 ENCOUNTER — Encounter: Payer: Self-pay | Admitting: Internal Medicine

## 2016-08-13 ENCOUNTER — Ambulatory Visit
Admission: RE | Admit: 2016-08-13 | Discharge: 2016-08-13 | Disposition: A | Discharge status: Home / Self Care | Payer: Medicare Other | Source: Ambulatory Visit | Attending: Internal Medicine | Admitting: Internal Medicine

## 2016-08-13 DIAGNOSIS — M4802 Spinal stenosis, cervical region: Secondary | ICD-10-CM | POA: Diagnosis not present

## 2016-08-13 DIAGNOSIS — M5412 Radiculopathy, cervical region: Secondary | ICD-10-CM

## 2016-08-28 ENCOUNTER — Ambulatory Visit (INDEPENDENT_AMBULATORY_CARE_PROVIDER_SITE_OTHER): Payer: Medicare Other | Admitting: Internal Medicine

## 2016-08-28 ENCOUNTER — Encounter: Payer: Self-pay | Admitting: Internal Medicine

## 2016-08-28 VITALS — BP 140/80 | HR 76 | Temp 97.9°F | Resp 16 | Ht 64.0 in | Wt 218.0 lb

## 2016-08-28 DIAGNOSIS — M48062 Spinal stenosis, lumbar region with neurogenic claudication: Secondary | ICD-10-CM | POA: Diagnosis not present

## 2016-08-28 DIAGNOSIS — M170 Bilateral primary osteoarthritis of knee: Secondary | ICD-10-CM | POA: Diagnosis not present

## 2016-08-28 DIAGNOSIS — B351 Tinea unguium: Secondary | ICD-10-CM

## 2016-08-28 DIAGNOSIS — M5412 Radiculopathy, cervical region: Secondary | ICD-10-CM | POA: Diagnosis not present

## 2016-08-28 MED ORDER — TERBINAFINE HCL 250 MG PO TABS: 250.0000 mg | ORAL_TABLET | Freq: Every day | ORAL | 2 refills | Status: DC

## 2016-08-28 NOTE — Progress Notes (Signed)
Subjective:  Patient ID: Leslie Edwards, female    DOB: [birth year]  Age: 70 y.o. MRN: [id number]  CC: Back Pain; Knee Pain; and Osteoarthritis   HPI Leslie Edwards presents for follow-up. She complains of persistent toenail fungus in her great toes and says it causes some discomfort. She also complains of worsening pain in both knees and wants to know if she is a candidate for knee replacement surgery. She complains of persistent pain in her neck and lower back and wants to know if she can be referred to pain management to consider epidural steroid injections. She is currently getting symptom relief with meloxicam and Norco. She offers no other complaints today.  Outpatient Medications Prior to Visit  Medication Sig Dispense Refill  . albuterol (PROVENTIL HFA;VENTOLIN HFA) 108 (90 BASE) MCG/ACT inhaler Inhale 2 puffs into the lungs every 6 (six) hours as needed.      . ALPRAZolam (XANAX) 0.25 MG tablet Take 1 tablet (0.25 mg total) by mouth 2 (two) times daily as needed for anxiety. 35 tablet 3  . budesonide-formoterol (SYMBICORT) 160-4.5 MCG/ACT inhaler Inhale 2 puffs into the lungs 2 (two) times daily. 1 Inhaler 11  . buPROPion (WELLBUTRIN XL) 150 MG 24 hr tablet TAKE 2 TABLETS IN THE MORNING AND 1 TABLET IN THE EVENING. 90 tablet 5  . fexofenadine-pseudoephedrine (ALLEGRA-D) 60-120 MG per tablet Take 1 tablet by mouth daily as needed.      . furosemide (LASIX) 40 MG tablet TAKE 1 TABLET TWICE DAILY. 60 tablet 11  . hyoscyamine (LEVSIN SL) 0.125 MG SL tablet Place 1 tablet (0.125 mg total) under the tongue every 4 (four) hours as needed. 30 tablet 11  . ipratropium-albuterol (DUONEB) 0.5-2.5 (3) MG/3ML SOLN Take 3 mLs by nebulization as needed.      Marland Kitchen levothyroxine (SYNTHROID, LEVOTHROID) 50 MCG tablet Take 1 tablet (50 mcg total) by mouth daily. 90 tablet 1  . liothyronine (CYTOMEL) 25 MCG tablet Take 1 tablet (25 mcg total) by mouth daily. 90 tablet 1  . liothyronine (CYTOMEL) 5 MCG  tablet TAKE 1 TABLET ONCE DAILY. 90 tablet 1  . meloxicam (MOBIC) 15 MG tablet Take 1 tablet (15 mg total) by mouth daily. 30 tablet 3  . methylPREDNISolone (MEDROL DOSEPAK) 4 MG TBPK tablet TAKE AS DIRECTED 21 tablet 0  . NASONEX 50 MCG/ACT nasal spray USE 2 SPRAYS IN EACH NOSTRIL ONCE DAILY 17 g 5  . omega-3 acid ethyl esters (LOVAZA) 1 g capsule Take 2 capsules (2 g total) by mouth 2 (two) times daily. 120 capsule 11  . Omeprazole-Sodium Bicarbonate (ZEGERID) 20-1100 MG CAPS Take 1 capsule by mouth at bedtime as needed.     Marland Kitchen oxyCODONE (OXY IR/ROXICODONE) 5 MG immediate release tablet Take 1 tablet (5 mg total) by mouth every 6 (six) hours as needed for severe pain. 90 tablet 0  . potassium chloride (K-DUR) 10 MEQ tablet TAKE 1 TABLET DAILY. 90 tablet 1  . tretinoin (RETIN-A) 0.025 % cream Reported on 12/21/2015    . Vilazodone HCl (VIIBRYD) 40 MG TABS Take 1 tablet (40 mg total) by mouth daily. 30 tablet 11   No facility-administered medications prior to visit.     ROS Review of Systems  Constitutional: Negative for activity change, appetite change, diaphoresis, fatigue and unexpected weight change.  HENT: Negative.  Negative for trouble swallowing.   Eyes: Negative.   Respiratory: Negative for cough, chest tightness, shortness of breath and wheezing.   Cardiovascular: Negative for chest pain  and palpitations.  Gastrointestinal: Negative for abdominal pain, blood in stool, constipation, diarrhea, nausea and vomiting.  Genitourinary: Negative.  Negative for difficulty urinating.  Musculoskeletal: Positive for arthralgias, back pain and neck pain. Negative for myalgias.  Skin: Negative.   Allergic/Immunologic: Negative.   Neurological: Negative.  Negative for weakness and numbness.  Hematological: Negative.  Negative for adenopathy. Does not bruise/bleed easily.  Psychiatric/Behavioral: Negative.  Negative for decreased concentration, dysphoric mood and sleep disturbance. The patient is  not nervous/anxious.     Objective:  BP 140/80 (BP Location: Left Arm, Patient Position: Sitting, Cuff Size: Large)   Pulse 76   Temp 97.9 F (36.6 C) (Oral)   Resp 16   Ht 5\' 4"  (1.626 m)   Wt 218 lb (98.9 kg)   SpO2 96%   BMI 37.42 kg/m   BP Readings from Last 3 Encounters:  08/28/16 140/80  07/30/16 130/82  01/05/16 120/74    Wt Readings from Last 3 Encounters:  08/28/16 218 lb (98.9 kg)  07/30/16 214 lb (97.1 kg)  12/21/15 220 lb (99.8 kg)    Physical Exam  Constitutional: She is oriented to person, place, and time. No distress.  HENT:  Mouth/Throat: Oropharynx is clear and moist. No oropharyngeal exudate.  Eyes: Conjunctivae are normal. Right eye exhibits no discharge. Left eye exhibits no discharge. No scleral icterus.  Neck: Normal range of motion. Neck supple. No JVD present. No tracheal deviation present. No thyromegaly present.  Cardiovascular: Normal rate, regular rhythm, normal heart sounds and intact distal pulses.  Exam reveals no gallop and no friction rub.   No murmur heard. Pulmonary/Chest: Effort normal and breath sounds normal. No stridor. No respiratory distress. She has no wheezes. She has no rales. She exhibits no tenderness.  Abdominal: Soft. Bowel sounds are normal. She exhibits no distension and no mass. There is no tenderness. There is no rebound and no guarding.  Musculoskeletal: Normal range of motion. She exhibits no edema, tenderness or deformity.  Lymphadenopathy:    She has no cervical adenopathy.  Neurological: She is oriented to person, place, and time.  Skin: Skin is warm and dry. No rash noted. She is not diaphoretic. No erythema. No pallor.  Both great toenails show 25% distal involvement with nailbed thickening, nail lysis, and subungual debris.  Vitals reviewed.   Lab Results  Component Value Date   WBC 6.0 07/30/2016   HGB 13.7 07/30/2016   HCT 39.8 07/30/2016   PLT 254.0 07/30/2016   GLUCOSE 107 (H) 07/30/2016   CHOL 246  (H) 10/31/2015   TRIG 265.0 (H) 10/31/2015   HDL 84.40 10/31/2015   LDLDIRECT 111.0 10/31/2015   LDLCALC 104 (H) 06/30/2015   ALT 21 07/30/2016   AST 17 07/30/2016   NA 140 07/30/2016   K 4.7 07/30/2016   CL 103 07/30/2016   CREATININE 1.22 (H) 07/30/2016   BUN 28 (H) 07/30/2016   CO2 30 07/30/2016   TSH 0.30 (L) 07/30/2016   HGBA1C 5.3 04/28/2009    Mr Cervical Spine Wo Contrast  Result Date: 08/13/2016 CLINICAL DATA:  Radiculitis in the right cervical region EXAM: MRI CERVICAL SPINE WITHOUT CONTRAST TECHNIQUE: Multiplanar, multisequence MR imaging of the cervical spine was performed. No intravenous contrast was administered. COMPARISON:  None. FINDINGS: Intermittently degraded by motion. Alignment: Mild C3-4 and C7-T1 anterolisthesis, facet mediated. Vertebrae: No fracture, evidence of discitis, or bone lesion. Cord: Normal signal and morphology. Posterior Fossa, vertebral arteries, paraspinal tissues: Negative Disc levels: C2-3: Mild facet arthropathy with spurring.  No impingement C3-4: Facet arthropathy with spurring on the left and slight anterolisthesis. Uncovertebral ridging greater on the left. Patent canal and foramina C4-5: Degenerative disc narrowing with posterior disc osteophyte complex and uncovertebral ridging. Negative facets. Partial ventral subarachnoid space effacement. Mild bilateral foraminal narrowing C5-6: Degenerative disc narrowing with endplate and uncovertebral ridging. Partial ventral subarachnoid space effacement. Wide foraminal patency. C6-7: Degenerative disc narrowing with endplate and uncovertebral ridging. Mild ligamentum flavum thickening with negative facets. Near circumferential subarachnoid space effacement. Mild foraminal narrowing bilaterally. C7-T1:Assessment limited by motion. Facet arthropathy with slight anterolisthesis mildly distorting the bilateral foramina. Wide canal patency. IMPRESSION: 1. Disc degeneration from C4-5 to C6-7. Facet arthropathy at  C3-4 and C7-T1 with slight anterolisthesis. 2. C6-7 mild spinal stenosis. 3. No focal or advanced right foraminal narrowing to explain right radiculopathy. Electronically Signed   By: Monte Fantasia M.D.   On: 08/13/2016 08:32    Assessment & Plan:   Temica was seen today for back pain, knee pain and osteoarthritis.  Diagnoses and all orders for this visit:  Radiculitis of right cervical region -     Ambulatory referral to Pain Clinic  Spinal stenosis, lumbar region, with neurogenic claudication -     Ambulatory referral to Pain Clinic  Primary osteoarthritis of both knees -     Ambulatory referral to Orthopedic Surgery  Onychomycosis of toenail- her recent liver enzymes are normal, she will restart terbinafine, I've asked return in one month to recheck her liver enzymes and she will let me know sooner if she develops any concerning signs or symptoms. -     terbinafine (LAMISIL) 250 MG tablet; Take 1 tablet (250 mg total) by mouth daily.   I am having Ms. Vieau start on terbinafine. I am also having her maintain her albuterol, fexofenadine-pseudoephedrine, ipratropium-albuterol, Omeprazole-Sodium Bicarbonate, tretinoin, hyoscyamine, potassium chloride, ALPRAZolam, Vilazodone HCl, omega-3 acid ethyl esters, NASONEX, meloxicam, liothyronine, liothyronine, buPROPion, furosemide, budesonide-formoterol, oxyCODONE, levothyroxine, and methylPREDNISolone.  Meds ordered this encounter  Medications  . terbinafine (LAMISIL) 250 MG tablet    Sig: Take 1 tablet (250 mg total) by mouth daily.    Dispense:  30 tablet    Refill:  2     Follow-up: Return in about 4 weeks (around 09/25/2016).  Scarlette Calico, MD

## 2016-08-28 NOTE — Patient Instructions (Signed)
Fungal Nail Infection Introduction Fungal nail infection is a common fungal infection of the toenails or fingernails. This condition affects toenails more often than fingernails. More than one nail may be infected. The condition can be passed from person to person (is contagious). What are the causes? This condition is caused by a fungus. Several types of funguses can cause the infection. These funguses are common in moist and warm areas. If your hands or feet come into contact with the fungus, it may get into a crack in your fingernail or toenail and cause the infection. What increases the risk? The following factors may make you more likely to develop this condition:  Being female.  Having diabetes.  Being of older age.  Living with someone who has the fungus.  Walking barefoot in areas where the fungus thrives, such as showers or locker rooms.  Having poor circulation.  Wearing shoes and socks that cause your feet to sweat.  Having athlete's foot.  Having a nail injury or history of a recent nail surgery.  Having psoriasis.  Having a weak body defense system (immune system). What are the signs or symptoms? Symptoms of this condition include:  A pale spot on the nail.  Thickening of the nail.  A nail that becomes yellow or brown.  A brittle or ragged nail edge.  A crumbling nail.  A nail that has lifted away from the nail bed. How is this diagnosed? This condition is diagnosed with a physical exam. Your health care provider may take a scraping or clipping from your nail to test for the fungus. How is this treated? Mild infections do not need treatment. If you have significant nail changes, treatment may include:  Oral antifungal medicines. You may need to take the medicine for several weeks or several months, and you may not see the results for a long time. These medicines can cause side effects. Ask your health care provider what problems to watch for.  Antifungal  nail polish and nail cream. These may be used along with oral antifungal medicines.  Laser treatment of the nail.  Surgery to remove the nail. This may be needed for the most severe infections. Treatment takes a long time, and the infection may come back. Follow these instructions at home: Medicines  Take or apply over-the-counter and prescription medicines only as told by your health care provider.  Ask your health care provider about using over-the-counter mentholated ointment on your nails. Lifestyle  Do not share personal items, such as towels or nail clippers.  Trim your nails often.  Wash and dry your hands and feet every day.  Wear absorbent socks, and change your socks frequently.  Wear shoes that allow air to circulate, such as sandals or canvas tennis shoes. Throw out old shoes.  Wear rubber gloves if you are working with your hands in wet areas.  Do not walk barefoot in shower rooms or locker rooms.  Do not use a nail salon that does not use clean instruments.  Do not use artificial nails. General instructions  Keep all follow-up visits as told by your health care provider. This is important.  Use antifungal foot powder on your feet and in your shoes. Contact a health care provider if: Your infection is not getting better or it is getting worse after several months. This information is not intended to replace advice given to you by your health care provider. Make sure you discuss any questions you have with your health care provider. Document  Released: 08/31/2000 Document Revised: 02/09/2016 Document Reviewed: 03/07/2015  2017 [employer]

## 2016-08-28 NOTE — Progress Notes (Signed)
Pre visit review using our clinic review tool, if applicable. No additional management support is needed unless otherwise documented below in the visit note. 

## 2016-09-19 DIAGNOSIS — M4807 Spinal stenosis, lumbosacral region: Secondary | ICD-10-CM | POA: Diagnosis not present

## 2016-09-19 DIAGNOSIS — M50822 Other cervical disc disorders at C5-C6 level: Secondary | ICD-10-CM | POA: Diagnosis not present

## 2016-09-19 DIAGNOSIS — M50821 Other cervical disc disorders at C4-C5 level: Secondary | ICD-10-CM | POA: Diagnosis not present

## 2016-09-19 DIAGNOSIS — M5416 Radiculopathy, lumbar region: Secondary | ICD-10-CM | POA: Diagnosis not present

## 2016-09-24 ENCOUNTER — Other Ambulatory Visit: Payer: Self-pay | Admitting: Internal Medicine

## 2016-09-24 DIAGNOSIS — E038 Other specified hypothyroidism: Secondary | ICD-10-CM

## 2016-09-24 DIAGNOSIS — F418 Other specified anxiety disorders: Secondary | ICD-10-CM

## 2016-09-25 ENCOUNTER — Other Ambulatory Visit: Payer: Self-pay | Admitting: Internal Medicine

## 2016-09-25 DIAGNOSIS — M50822 Other cervical disc disorders at C5-C6 level: Secondary | ICD-10-CM | POA: Diagnosis not present

## 2016-09-25 DIAGNOSIS — E038 Other specified hypothyroidism: Secondary | ICD-10-CM

## 2016-09-25 DIAGNOSIS — M50821 Other cervical disc disorders at C4-C5 level: Secondary | ICD-10-CM | POA: Diagnosis not present

## 2016-09-25 DIAGNOSIS — M4807 Spinal stenosis, lumbosacral region: Secondary | ICD-10-CM | POA: Diagnosis not present

## 2016-09-25 DIAGNOSIS — M50823 Other cervical disc disorders at C6-C7 level: Secondary | ICD-10-CM | POA: Diagnosis not present

## 2016-09-26 ENCOUNTER — Ambulatory Visit: Payer: Medicare Other | Admitting: Internal Medicine

## 2016-09-27 ENCOUNTER — Ambulatory Visit (INDEPENDENT_AMBULATORY_CARE_PROVIDER_SITE_OTHER): Payer: Medicare Other | Admitting: Internal Medicine

## 2016-09-27 ENCOUNTER — Telehealth: Payer: Self-pay

## 2016-09-27 ENCOUNTER — Encounter: Payer: Self-pay | Admitting: Internal Medicine

## 2016-09-27 VITALS — BP 128/90 | HR 74 | Temp 98.1°F | Resp 16 | Ht 64.0 in | Wt 208.5 lb

## 2016-09-27 DIAGNOSIS — J45998 Other asthma: Secondary | ICD-10-CM | POA: Diagnosis not present

## 2016-09-27 DIAGNOSIS — J4520 Mild intermittent asthma, uncomplicated: Secondary | ICD-10-CM | POA: Diagnosis not present

## 2016-09-27 DIAGNOSIS — M170 Bilateral primary osteoarthritis of knee: Secondary | ICD-10-CM

## 2016-09-27 DIAGNOSIS — M5412 Radiculopathy, cervical region: Secondary | ICD-10-CM | POA: Diagnosis not present

## 2016-09-27 DIAGNOSIS — J0100 Acute maxillary sinusitis, unspecified: Secondary | ICD-10-CM | POA: Diagnosis not present

## 2016-09-27 MED ORDER — IPRATROPIUM-ALBUTEROL 0.5-2.5 (3) MG/3ML IN SOLN: 3.0000 mL | RESPIRATORY_TRACT | 1 refills | Status: DC | PRN

## 2016-09-27 MED ORDER — CLINDAMYCIN HCL 300 MG PO CAPS: 300.0000 mg | ORAL_CAPSULE | Freq: Three times a day (TID) | ORAL | 1 refills | Status: AC

## 2016-09-27 MED ORDER — HYDROCODONE-HOMATROPINE 5-1.5 MG/5ML PO SYRP: 5.0000 mL | ORAL_SOLUTION | Freq: Three times a day (TID) | ORAL | 0 refills | Status: DC | PRN

## 2016-09-27 MED ORDER — MELOXICAM 15 MG PO TABS: 15.0000 mg | ORAL_TABLET | Freq: Every day | ORAL | 1 refills | Status: DC

## 2016-09-27 MED ORDER — ALBUTEROL SULFATE HFA 108 (90 BASE) MCG/ACT IN AERS: 2.0000 | INHALATION_SPRAY | Freq: Four times a day (QID) | RESPIRATORY_TRACT | 11 refills | Status: AC | PRN

## 2016-09-27 NOTE — Progress Notes (Signed)
Subjective:  Patient ID: Leslie Edwards, female    DOB: 1945/11/22  Age: 71 y.o. MRN: [id number]  CC: Cough and Sinusitis   HPI Leslie Edwards presents for a seven-day history of facial pain, green bloody phlegm from her nose, cough productive of thick green phlegm, diffuse tooth pain, low-grade fever and chills. She has started taking steroid tablets and as gotten some symptom relief.  Outpatient Medications Prior to Visit  Medication Sig Dispense Refill  . ALPRAZolam (XANAX) 0.25 MG tablet Take 1 tablet (0.25 mg total) by mouth 2 (two) times daily as needed for anxiety. 35 tablet 3  . budesonide-formoterol (SYMBICORT) 160-4.5 MCG/ACT inhaler Inhale 2 puffs into the lungs 2 (two) times daily. 1 Inhaler 11  . buPROPion (WELLBUTRIN XL) 150 MG 24 hr tablet TAKE 2 TABLETS IN THE MORNING AND 1 TABLET IN THE EVENING. 90 tablet 3  . fexofenadine-pseudoephedrine (ALLEGRA-D) 60-120 MG per tablet Take 1 tablet by mouth daily as needed.      . furosemide (LASIX) 40 MG tablet TAKE 1 TABLET TWICE DAILY. 60 tablet 11  . hyoscyamine (LEVSIN SL) 0.125 MG SL tablet Place 1 tablet (0.125 mg total) under the tongue every 4 (four) hours as needed. 30 tablet 11  . levothyroxine (SYNTHROID, LEVOTHROID) 50 MCG tablet Take 1 tablet (50 mcg total) by mouth daily. 90 tablet 1  . liothyronine (CYTOMEL) 25 MCG tablet TAKE 1 TABLET ONCE DAILY. 90 tablet 1  . liothyronine (CYTOMEL) 5 MCG tablet TAKE 1 TABLET ONCE DAILY. 90 tablet 3  . NASONEX 50 MCG/ACT nasal spray USE 2 SPRAYS IN EACH NOSTRIL ONCE DAILY 17 g 5  . omega-3 acid ethyl esters (LOVAZA) 1 g capsule Take 2 capsules (2 g total) by mouth 2 (two) times daily. 120 capsule 11  . Omeprazole-Sodium Bicarbonate (ZEGERID) 20-1100 MG CAPS Take 1 capsule by mouth at bedtime as needed.     . potassium chloride (K-DUR) 10 MEQ tablet TAKE 1 TABLET DAILY. 90 tablet 3  . tretinoin (RETIN-A) 0.025 % cream Reported on 12/21/2015    . VIIBRYD 40 MG TABS TAKE 1 TABLET ONCE  DAILY. 90 tablet 3  . albuterol (PROVENTIL HFA;VENTOLIN HFA) 108 (90 BASE) MCG/ACT inhaler Inhale 2 puffs into the lungs every 6 (six) hours as needed.      Marland Kitchen ipratropium-albuterol (DUONEB) 0.5-2.5 (3) MG/3ML SOLN Take 3 mLs by nebulization as needed.      . meloxicam (MOBIC) 15 MG tablet Take 1 tablet (15 mg total) by mouth daily. 30 tablet 3  . methylPREDNISolone (MEDROL DOSEPAK) 4 MG TBPK tablet TAKE AS DIRECTED 21 tablet 0  . oxyCODONE (OXY IR/ROXICODONE) 5 MG immediate release tablet Take 1 tablet (5 mg total) by mouth every 6 (six) hours as needed for severe pain. 90 tablet 0  . potassium chloride (K-DUR) 10 MEQ tablet TAKE 1 TABLET DAILY. 90 tablet 1  . terbinafine (LAMISIL) 250 MG tablet Take 1 tablet (250 mg total) by mouth daily. 30 tablet 2   No facility-administered medications prior to visit.     ROS Review of Systems  Constitutional: Positive for chills and fever. Negative for diaphoresis, fatigue and unexpected weight change.  HENT: Positive for congestion, rhinorrhea and sinus pain. Negative for facial swelling, sneezing and trouble swallowing.   Eyes: Negative.   Respiratory: Positive for cough and wheezing. Negative for chest tightness, shortness of breath and stridor.   Cardiovascular: Negative for chest pain, palpitations and leg swelling.  Gastrointestinal: Negative for abdominal pain, constipation,  diarrhea and nausea.  Endocrine: Negative.   Genitourinary: Negative.  Negative for difficulty urinating.  Musculoskeletal: Positive for arthralgias, back pain and neck pain. Negative for myalgias.  Skin: Negative.  Negative for rash.  Allergic/Immunologic: Negative.   Neurological: Negative.  Negative for dizziness and headaches.  Hematological: Negative for adenopathy. Does not bruise/bleed easily.  Psychiatric/Behavioral: Negative.     Objective:  BP 128/90 (BP Location: Left Arm, Patient Position: Sitting, Cuff Size: Large)   Pulse 74   Temp 98.1 F (36.7 C)  (Oral)   Resp 16   Ht 5\' 4"  (1.626 m)   Wt 208 lb 8 oz (94.6 kg)   SpO2 98%   BMI 35.79 kg/m   BP Readings from Last 3 Encounters:  09/27/16 128/90  08/28/16 140/80  07/30/16 130/82    Wt Readings from Last 3 Encounters:  09/27/16 208 lb 8 oz (94.6 kg)  08/28/16 218 lb (98.9 kg)  07/30/16 214 lb (97.1 kg)    Physical Exam  Constitutional: She is oriented to person, place, and time.  Non-toxic appearance. She does not have a sickly appearance. She does not appear ill. No distress.  HENT:  Right Ear: Hearing, tympanic membrane, external ear and ear canal normal.  Left Ear: Hearing, tympanic membrane, external ear and ear canal normal.  Nose: Rhinorrhea present. No mucosal edema or nasal septal hematoma. No epistaxis. Right sinus exhibits maxillary sinus tenderness. Right sinus exhibits no frontal sinus tenderness. Left sinus exhibits maxillary sinus tenderness. Left sinus exhibits no frontal sinus tenderness.  Mouth/Throat: Oropharynx is clear and moist and mucous membranes are normal. Mucous membranes are not pale, not dry and not cyanotic. No oral lesions. No trismus in the jaw. No uvula swelling. No oropharyngeal exudate, posterior oropharyngeal edema, posterior oropharyngeal erythema or tonsillar abscesses.  Eyes: Conjunctivae are normal. Right eye exhibits no discharge. Left eye exhibits no discharge. No scleral icterus.  Neck: Normal range of motion. Neck supple. No JVD present. No tracheal deviation present. No thyromegaly present.  Cardiovascular: Normal rate, regular rhythm, normal heart sounds and intact distal pulses.  Exam reveals no gallop and no friction rub.   No murmur heard. Pulmonary/Chest: Effort normal and breath sounds normal. No stridor. No respiratory distress. She has no wheezes. She has no rales. She exhibits no tenderness.  Abdominal: Soft. Bowel sounds are normal. She exhibits no distension and no mass. There is no tenderness. There is no rebound and no  guarding.  Musculoskeletal: Normal range of motion. She exhibits no edema, tenderness or deformity.  Lymphadenopathy:    She has no cervical adenopathy.  Neurological: She is oriented to person, place, and time.  Skin: Skin is warm and dry. No rash noted. She is not diaphoretic. No erythema. No pallor.  Vitals reviewed.   Lab Results  Component Value Date   WBC 6.0 07/30/2016   HGB 13.7 07/30/2016   HCT 39.8 07/30/2016   PLT 254.0 07/30/2016   GLUCOSE 107 (H) 07/30/2016   CHOL 246 (H) 10/31/2015   TRIG 265.0 (H) 10/31/2015   HDL 84.40 10/31/2015   LDLDIRECT 111.0 10/31/2015   LDLCALC 104 (H) 06/30/2015   ALT 21 07/30/2016   AST 17 07/30/2016   NA 140 07/30/2016   K 4.7 07/30/2016   CL 103 07/30/2016   CREATININE 1.22 (H) 07/30/2016   BUN 28 (H) 07/30/2016   CO2 30 07/30/2016   TSH 0.30 (L) 07/30/2016   HGBA1C 5.3 04/28/2009    Mr Cervical Spine Wo Contrast  Result  Date: 08/13/2016 CLINICAL DATA:  Radiculitis in the right cervical region EXAM: MRI CERVICAL SPINE WITHOUT CONTRAST TECHNIQUE: Multiplanar, multisequence MR imaging of the cervical spine was performed. No intravenous contrast was administered. COMPARISON:  None. FINDINGS: Intermittently degraded by motion. Alignment: Mild C3-4 and C7-T1 anterolisthesis, facet mediated. Vertebrae: No fracture, evidence of discitis, or bone lesion. Cord: Normal signal and morphology. Posterior Fossa, vertebral arteries, paraspinal tissues: Negative Disc levels: C2-3: Mild facet arthropathy with spurring.  No impingement C3-4: Facet arthropathy with spurring on the left and slight anterolisthesis. Uncovertebral ridging greater on the left. Patent canal and foramina C4-5: Degenerative disc narrowing with posterior disc osteophyte complex and uncovertebral ridging. Negative facets. Partial ventral subarachnoid space effacement. Mild bilateral foraminal narrowing C5-6: Degenerative disc narrowing with endplate and uncovertebral ridging.  Partial ventral subarachnoid space effacement. Wide foraminal patency. C6-7: Degenerative disc narrowing with endplate and uncovertebral ridging. Mild ligamentum flavum thickening with negative facets. Near circumferential subarachnoid space effacement. Mild foraminal narrowing bilaterally. C7-T1:Assessment limited by motion. Facet arthropathy with slight anterolisthesis mildly distorting the bilateral foramina. Wide canal patency. IMPRESSION: 1. Disc degeneration from C4-5 to C6-7. Facet arthropathy at C3-4 and C7-T1 with slight anterolisthesis. 2. C6-7 mild spinal stenosis. 3. No focal or advanced right foraminal narrowing to explain right radiculopathy. Electronically Signed   By: Monte Fantasia M.D.   On: 08/13/2016 08:32    Assessment & Plan:   Darly was seen today for cough and sinusitis.  Diagnoses and all orders for this visit:  Mild intermittent asthma without complication- she appears to be well controlled, will continue the current inhalers to control the disease -     HYDROcodone-homatropine (HYCODAN) 5-1.5 MG/5ML syrup; Take 5 mLs by mouth every 8 (eight) hours as needed for cough. -     albuterol (PROVENTIL HFA;VENTOLIN HFA) 108 (90 Base) MCG/ACT inhaler; Inhale 2 puffs into the lungs every 6 (six) hours as needed. -     Discontinue: ipratropium-albuterol (DUONEB) 0.5-2.5 (3) MG/3ML SOLN; Take 3 mLs by nebulization as needed. -     ipratropium-albuterol (DUONEB) 0.5-2.5 (3) MG/3ML SOLN; Take 3 mLs by nebulization as needed.  Acute non-recurrent maxillary sinusitis- I'm concerned she right have a resistant organism so I've asked her to start taking clindamycin to treat gram-positive and resistant bacteria. In the meantime will try to control her symptoms with Hycodan as needed. -     HYDROcodone-homatropine (HYCODAN) 5-1.5 MG/5ML syrup; Take 5 mLs by mouth every 8 (eight) hours as needed for cough. -     clindamycin (CLEOCIN) 300 MG capsule; Take 1 capsule (300 mg total) by mouth 3  (three) times daily.  Radiculitis of right cervical region -     meloxicam (MOBIC) 15 MG tablet; Take 1 tablet (15 mg total) by mouth daily.  Primary osteoarthritis of both knees -     meloxicam (MOBIC) 15 MG tablet; Take 1 tablet (15 mg total) by mouth daily.   I have discontinued Leslie Edwards's oxyCODONE, methylPREDNISolone, and terbinafine. I am also having her start on HYDROcodone-homatropine and clindamycin. Additionally, I am having her maintain her fexofenadine-pseudoephedrine, Omeprazole-Sodium Bicarbonate, tretinoin, hyoscyamine, ALPRAZolam, omega-3 acid ethyl esters, NASONEX, furosemide, budesonide-formoterol, levothyroxine, VIIBRYD, liothyronine, potassium chloride, buPROPion, liothyronine, albuterol, ipratropium-albuterol, and meloxicam.  Meds ordered this encounter  Medications  . HYDROcodone-homatropine (HYCODAN) 5-1.5 MG/5ML syrup    Sig: Take 5 mLs by mouth every 8 (eight) hours as needed for cough.    Dispense:  120 mL    Refill:  0  . clindamycin (CLEOCIN)  300 MG capsule    Sig: Take 1 capsule (300 mg total) by mouth 3 (three) times daily.    Dispense:  21 capsule    Refill:  1  . albuterol (PROVENTIL HFA;VENTOLIN HFA) 108 (90 Base) MCG/ACT inhaler    Sig: Inhale 2 puffs into the lungs every 6 (six) hours as needed.    Dispense:  18 g    Refill:  11  . DISCONTD: ipratropium-albuterol (DUONEB) 0.5-2.5 (3) MG/3ML SOLN    Sig: Take 3 mLs by nebulization as needed.    Dispense:  360 mL    Refill:  1  . ipratropium-albuterol (DUONEB) 0.5-2.5 (3) MG/3ML SOLN    Sig: Take 3 mLs by nebulization as needed.    Dispense:  360 mL    Refill:  1  . meloxicam (MOBIC) 15 MG tablet    Sig: Take 1 tablet (15 mg total) by mouth daily.    Dispense:  90 tablet    Refill:  1     Follow-up: Return in about 3 weeks (around 10/18/2016).  Scarlette Calico, MD

## 2016-09-27 NOTE — Patient Instructions (Signed)

## 2016-09-27 NOTE — Telephone Encounter (Signed)
Pt is rq rf for meloxicam for her plantar fascitis. Please advise.

## 2016-09-27 NOTE — Progress Notes (Signed)
Pre visit review using our clinic review tool, if applicable. No additional management support is needed unless otherwise documented below in the visit note. 

## 2016-09-27 NOTE — Telephone Encounter (Signed)
Completed and faxed [facility] doctor form to [phone]

## 2016-10-08 ENCOUNTER — Other Ambulatory Visit: Payer: Self-pay | Admitting: Internal Medicine

## 2016-10-08 ENCOUNTER — Encounter: Payer: Self-pay | Admitting: Internal Medicine

## 2016-10-08 DIAGNOSIS — J0100 Acute maxillary sinusitis, unspecified: Secondary | ICD-10-CM

## 2016-10-08 DIAGNOSIS — M50822 Other cervical disc disorders at C5-C6 level: Secondary | ICD-10-CM | POA: Diagnosis not present

## 2016-10-08 DIAGNOSIS — M50823 Other cervical disc disorders at C6-C7 level: Secondary | ICD-10-CM | POA: Diagnosis not present

## 2016-10-08 DIAGNOSIS — R040 Epistaxis: Secondary | ICD-10-CM | POA: Insufficient documentation

## 2016-10-08 DIAGNOSIS — M4807 Spinal stenosis, lumbosacral region: Secondary | ICD-10-CM | POA: Diagnosis not present

## 2016-10-08 DIAGNOSIS — M50821 Other cervical disc disorders at C4-C5 level: Secondary | ICD-10-CM | POA: Diagnosis not present

## 2016-10-09 ENCOUNTER — Other Ambulatory Visit: Payer: Self-pay | Admitting: Internal Medicine

## 2016-10-09 DIAGNOSIS — R040 Epistaxis: Secondary | ICD-10-CM

## 2016-10-09 DIAGNOSIS — J0100 Acute maxillary sinusitis, unspecified: Secondary | ICD-10-CM

## 2016-10-10 ENCOUNTER — Ambulatory Visit (INDEPENDENT_AMBULATORY_CARE_PROVIDER_SITE_OTHER)
Admission: RE | Admit: 2016-10-10 | Discharge: 2016-10-10 | Disposition: A | Discharge status: Home / Self Care | Payer: Medicare Other | Source: Ambulatory Visit | Attending: Internal Medicine | Admitting: Internal Medicine

## 2016-10-10 ENCOUNTER — Inpatient Hospital Stay: Admission: RE | Admit: 2016-10-10 | Payer: Medicare Other | Source: Ambulatory Visit

## 2016-10-10 ENCOUNTER — Other Ambulatory Visit: Payer: Self-pay | Admitting: Internal Medicine

## 2016-10-10 ENCOUNTER — Encounter: Payer: Self-pay | Admitting: Internal Medicine

## 2016-10-10 DIAGNOSIS — J0100 Acute maxillary sinusitis, unspecified: Secondary | ICD-10-CM

## 2016-10-10 DIAGNOSIS — R040 Epistaxis: Secondary | ICD-10-CM

## 2016-10-10 DIAGNOSIS — R0981 Nasal congestion: Secondary | ICD-10-CM | POA: Diagnosis not present

## 2016-10-18 ENCOUNTER — Other Ambulatory Visit (INDEPENDENT_AMBULATORY_CARE_PROVIDER_SITE_OTHER): Payer: Medicare Other

## 2016-10-18 ENCOUNTER — Encounter: Payer: Self-pay | Admitting: Internal Medicine

## 2016-10-18 ENCOUNTER — Ambulatory Visit (INDEPENDENT_AMBULATORY_CARE_PROVIDER_SITE_OTHER): Payer: Medicare Other | Admitting: Internal Medicine

## 2016-10-18 VITALS — BP 128/80 | HR 70 | Temp 98.0°F | Resp 16 | Ht 64.0 in | Wt 210.8 lb

## 2016-10-18 DIAGNOSIS — E781 Pure hyperglyceridemia: Secondary | ICD-10-CM

## 2016-10-18 DIAGNOSIS — E038 Other specified hypothyroidism: Secondary | ICD-10-CM

## 2016-10-18 DIAGNOSIS — J301 Allergic rhinitis due to pollen: Secondary | ICD-10-CM | POA: Diagnosis not present

## 2016-10-18 DIAGNOSIS — N184 Chronic kidney disease, stage 4 (severe): Secondary | ICD-10-CM

## 2016-10-18 DIAGNOSIS — E213 Hyperparathyroidism, unspecified: Secondary | ICD-10-CM

## 2016-10-18 LAB — LIPID PANEL
CHOL/HDL RATIO: 3
CHOLESTEROL: 238 mg/dL — AB (ref 0–200)
HDL: 92.5 mg/dL (ref >39.00)
LDL Cholesterol: 129 mg/dL — ABNORMAL HIGH (ref 0–99)
NonHDL: 145.62
TRIGLYCERIDES: 84 mg/dL (ref 0.0–149.0)
VLDL: 16.8 mg/dL (ref 0.0–40.0)

## 2016-10-18 LAB — COMPREHENSIVE METABOLIC PANEL
ALT: 18 U/L (ref 0–35)
AST: 8 U/L (ref 0–37)
Albumin: 4.7 g/dL (ref 3.5–5.2)
Alkaline Phosphatase: 64 U/L (ref 39–117)
BILIRUBIN TOTAL: 0.6 mg/dL (ref 0.2–1.2)
BUN: 37 mg/dL — ABNORMAL HIGH (ref 6–23)
CALCIUM: 9.6 mg/dL (ref 8.4–10.5)
CO2: 31 meq/L (ref 19–32)
Chloride: 102 mEq/L (ref 96–112)
Creatinine, Ser: 1.46 mg/dL — ABNORMAL HIGH (ref 0.40–1.20)
GFR: 37.45 mL/min — AB (ref >60.00)
Glucose, Bld: 104 mg/dL — ABNORMAL HIGH (ref 70–99)
Potassium: 4.4 mEq/L (ref 3.5–5.1)
Sodium: 138 mEq/L (ref 135–145)
Total Protein: 6.8 g/dL (ref 6.0–8.3)

## 2016-10-18 LAB — THYROID PANEL WITH TSH
FREE THYROXINE INDEX: 1.1 — AB (ref 1.4–3.8)
T3 Uptake: 32 % (ref 22–35)
T4, Total: 3.4 ug/dL — ABNORMAL LOW (ref 4.5–12.0)
TSH: 1.71 m[IU]/L

## 2016-10-18 MED ORDER — METHYLPREDNISOLONE ACETATE 80 MG/ML IJ SUSP
120.0000 mg | Freq: Once | INTRAMUSCULAR | Status: AC
  Administered 2016-10-18: 120 mg via INTRAMUSCULAR

## 2016-10-18 MED ORDER — MOMETASONE FUROATE 50 MCG/ACT NA SUSP: NASAL | 11 refills | Status: DC

## 2016-10-18 MED ORDER — MOMETASONE FUROATE 50 MCG/ACT NA SUSP
NASAL | 11 refills | Status: DC
Start: 2016-10-18 — End: 2016-10-18

## 2016-10-18 NOTE — Progress Notes (Signed)
Subjective:  Patient ID: Leslie Edwards, female    DOB: 13-Jul-1946  Age: 71 y.o. MRN: [id number]  CC: Allergic Rhinitis ; Hyperlipidemia; Hypothyroidism; and Asthma   HPI Raveena H Furber presents for follow-up on multiple medical problems. She tells me her cough is finally much better but she complains of severe right nasal congestion, runny nose, and sneezing.  Outpatient Medications Prior to Visit  Medication Sig Dispense Refill  . albuterol (PROVENTIL HFA;VENTOLIN HFA) 108 (90 Base) MCG/ACT inhaler Inhale 2 puffs into the lungs every 6 (six) hours as needed. 18 g 11  . ALPRAZolam (XANAX) 0.25 MG tablet Take 1 tablet (0.25 mg total) by mouth 2 (two) times daily as needed for anxiety. 35 tablet 3  . budesonide-formoterol (SYMBICORT) 160-4.5 MCG/ACT inhaler Inhale 2 puffs into the lungs 2 (two) times daily. 1 Inhaler 11  . buPROPion (WELLBUTRIN XL) 150 MG 24 hr tablet TAKE 2 TABLETS IN THE MORNING AND 1 TABLET IN THE EVENING. 90 tablet 3  . fexofenadine-pseudoephedrine (ALLEGRA-D) 60-120 MG per tablet Take 1 tablet by mouth daily as needed.      . furosemide (LASIX) 40 MG tablet TAKE 1 TABLET TWICE DAILY. 60 tablet 11  . hyoscyamine (LEVSIN SL) 0.125 MG SL tablet Place 1 tablet (0.125 mg total) under the tongue every 4 (four) hours as needed. 30 tablet 11  . ipratropium-albuterol (DUONEB) 0.5-2.5 (3) MG/3ML SOLN Take 3 mLs by nebulization as needed. 360 mL 1  . levothyroxine (SYNTHROID, LEVOTHROID) 50 MCG tablet Take 1 tablet (50 mcg total) by mouth daily. 90 tablet 1  . liothyronine (CYTOMEL) 25 MCG tablet TAKE 1 TABLET ONCE DAILY. 90 tablet 1  . liothyronine (CYTOMEL) 5 MCG tablet TAKE 1 TABLET ONCE DAILY. 90 tablet 3  . meloxicam (MOBIC) 15 MG tablet Take 1 tablet (15 mg total) by mouth daily. 90 tablet 1  . omega-3 acid ethyl esters (LOVAZA) 1 g capsule Take 2 capsules (2 g total) by mouth 2 (two) times daily. 120 capsule 11  . Omeprazole-Sodium Bicarbonate (ZEGERID) 20-1100 MG CAPS  Take 1 capsule by mouth at bedtime as needed.     . potassium chloride (K-DUR) 10 MEQ tablet TAKE 1 TABLET DAILY. 90 tablet 3  . tretinoin (RETIN-A) 0.025 % cream Reported on 12/21/2015    . VIIBRYD 40 MG TABS TAKE 1 TABLET ONCE DAILY. 90 tablet 3  . HYDROcodone-homatropine (HYCODAN) 5-1.5 MG/5ML syrup Take 5 mLs by mouth every 8 (eight) hours as needed for cough. 120 mL 0  . NASONEX 50 MCG/ACT nasal spray USE 2 SPRAYS IN EACH NOSTRIL ONCE DAILY 17 g 5   No facility-administered medications prior to visit.     ROS Review of Systems  Constitutional: Negative for chills, fatigue and unexpected weight change.  HENT: Positive for congestion, postnasal drip and rhinorrhea. Negative for facial swelling, sinus pain, sinus pressure, sneezing, sore throat and trouble swallowing.   Eyes: Negative for visual disturbance.  Respiratory: Negative for cough, chest tightness, shortness of breath and wheezing.   Cardiovascular: Negative for chest pain, palpitations and leg swelling.  Gastrointestinal: Negative for abdominal pain, constipation, diarrhea, nausea and vomiting.  Endocrine: Negative.  Negative for cold intolerance and heat intolerance.  Genitourinary: Negative.   Musculoskeletal: Positive for back pain. Negative for myalgias and neck pain.  Skin: Negative.  Negative for rash.  Neurological: Negative.   Hematological: Negative for adenopathy. Does not bruise/bleed easily.  Psychiatric/Behavioral: Negative.     Objective:  BP 128/80 (BP Location: Left Arm,  Patient Position: Sitting, Cuff Size: Large)   Pulse 70   Temp 98 F (36.7 C) (Oral)   Resp 16   Ht 5\' 4"  (1.626 m)   Wt 210 lb 12 oz (95.6 kg)   SpO2 97%   BMI 36.18 kg/m   BP Readings from Last 3 Encounters:  10/18/16 128/80  09/27/16 128/90  08/28/16 140/80    Wt Readings from Last 3 Encounters:  10/18/16 210 lb 12 oz (95.6 kg)  09/27/16 208 lb 8 oz (94.6 kg)  08/28/16 218 lb (98.9 kg)    Physical Exam  Constitutional:  She is oriented to person, place, and time. No distress.  HENT:  Nose: Mucosal edema and rhinorrhea present. No sinus tenderness. No epistaxis. Right sinus exhibits no maxillary sinus tenderness and no frontal sinus tenderness. Left sinus exhibits no maxillary sinus tenderness and no frontal sinus tenderness.  Mouth/Throat: Oropharynx is clear and moist. No oropharyngeal exudate.  Eyes: Conjunctivae are normal. Right eye exhibits no discharge. Left eye exhibits no discharge. No scleral icterus.  Neck: Normal range of motion. Neck supple. No JVD present. No tracheal deviation present. No thyromegaly present.  Cardiovascular: Normal rate, regular rhythm, normal heart sounds and intact distal pulses.  Exam reveals no gallop and no friction rub.   No murmur heard. Pulmonary/Chest: Effort normal and breath sounds normal. No stridor. No respiratory distress. She has no wheezes. She has no rales. She exhibits no tenderness.  Abdominal: Soft. Bowel sounds are normal. She exhibits no distension and no mass. There is no tenderness. There is no rebound and no guarding.  Musculoskeletal: Normal range of motion. She exhibits no edema, tenderness or deformity.  Lymphadenopathy:    She has no cervical adenopathy.  Neurological: She is oriented to person, place, and time.  Skin: Skin is warm and dry. No rash noted. She is not diaphoretic. No erythema.  Vitals reviewed.   Lab Results  Component Value Date   WBC 6.0 07/30/2016   HGB 13.7 07/30/2016   HCT 39.8 07/30/2016   PLT 254.0 07/30/2016   GLUCOSE 104 (H) 10/18/2016   CHOL 238 (H) 10/18/2016   TRIG 84.0 10/18/2016   HDL 92.50 10/18/2016   LDLDIRECT 111.0 10/31/2015   LDLCALC 129 (H) 10/18/2016   ALT 18 10/18/2016   AST 8 10/18/2016   NA 138 10/18/2016   K 4.4 10/18/2016   CL 102 10/18/2016   CREATININE 1.46 (H) 10/18/2016   BUN 37 (H) 10/18/2016   CO2 31 10/18/2016   TSH 1.71 10/18/2016   HGBA1C 5.3 04/28/2009    Ct Maxillofacial Ltd Wo  Cm  Result Date: 10/10/2016 CLINICAL DATA:  Sinus drainage, hoarseness, cough, nasal congestion. EXAM: CT PARANASAL SINUS LIMITED WITHOUT CONTRAST TECHNIQUE: Non-contiguous multidetector CT images of the paranasal sinuses were obtained in a single plane without contrast. COMPARISON:  None. FINDINGS: No mucosal thickening or air-fluid levels in the paranasal sinuses. Visualized orbital and facial soft tissues and bony structures unremarkable. IMPRESSION: No evidence of sinusitis. Electronically Signed   By: Rolm Baptise M.D.   On: 10/10/2016 09:26    Assessment & Plan:   Cynethia was seen today for allergic rhinitis , hyperlipidemia, hypothyroidism and asthma.  Diagnoses and all orders for this visit:  Other specified hypothyroidism- Her TFTs are within normal limits, will continue the current dose. -     Lipid panel; Future -     Thyroid Panel With TSH; Future  Pure hyperglyceridemia- improvement noted, she was praised for lifestyle modifications. -  Lipid panel; Future  Kidney disease, chronic, stage IV (GFR 15-29 ml/min) ([place])- her renal function is stable, she will continue to avoid nephrotoxic agents. -     Comprehensive metabolic panel; Future  Parathyroid hormone excess ([place])- her calcium level is normal now, will continue to follow this. -     Comprehensive metabolic panel; Future  Acute seasonal allergic rhinitis due to pollen- she is having a moderately severe exacerbation so I gave her an injection of Depo-Medrol and will start an inhaled nasal steroid. -     Discontinue: mometasone (NASONEX) 50 MCG/ACT nasal spray; USE 2 SPRAYS IN EACH NOSTRIL ONCE DAILY -     mometasone (NASONEX) 50 MCG/ACT nasal spray; USE 2 SPRAYS IN EACH NOSTRIL ONCE DAILY -     methylPREDNISolone acetate (DEPO-MEDROL) injection 120 mg; Inject 1.5 mLs (120 mg total) into the muscle once.   I have discontinued Ms. Krohn's NASONEX and HYDROcodone-homatropine. I am also having her maintain her  fexofenadine-pseudoephedrine, Omeprazole-Sodium Bicarbonate, tretinoin, hyoscyamine, ALPRAZolam, omega-3 acid ethyl esters, furosemide, budesonide-formoterol, levothyroxine, VIIBRYD, liothyronine, potassium chloride, buPROPion, liothyronine, albuterol, ipratropium-albuterol, meloxicam, and mometasone. We administered methylPREDNISolone acetate.  Meds ordered this encounter  Medications  . DISCONTD: mometasone (NASONEX) 50 MCG/ACT nasal spray    Sig: USE 2 SPRAYS IN EACH NOSTRIL ONCE DAILY    Dispense:  17 g    Refill:  11  . mometasone (NASONEX) 50 MCG/ACT nasal spray    Sig: USE 2 SPRAYS IN EACH NOSTRIL ONCE DAILY    Dispense:  17 g    Refill:  11  . methylPREDNISolone acetate (DEPO-MEDROL) injection 120 mg     Follow-up: Return in about 6 months (around 04/17/2017).  Scarlette Calico, MD

## 2016-10-18 NOTE — Patient Instructions (Signed)
Hypothyroidism Hypothyroidism is a disorder of the thyroid. The thyroid is a large gland that is located in the lower front of the neck. The thyroid releases hormones that control how the body works. With hypothyroidism, the thyroid does not make enough of these hormones. What are the causes? Causes of hypothyroidism may include:  Viral infections.  Pregnancy.  Your own defense system (immune system) attacking your thyroid.  Certain medicines.  Birth defects.  Past radiation treatments to your head or neck.  Past treatment with radioactive iodine.  Past surgical removal of part or all of your thyroid.  Problems with the gland that is located in the center of your brain (pituitary).  What are the signs or symptoms? Signs and symptoms of hypothyroidism may include:  Feeling as though you have no energy (lethargy).  Inability to tolerate cold.  Weight gain that is not explained by a change in diet or exercise habits.  Dry skin.  Coarse hair.  Menstrual irregularity.  Slowing of thought processes.  Constipation.  Sadness or depression.  How is this diagnosed? Your health care provider may diagnose hypothyroidism with blood tests and ultrasound tests. How is this treated? Hypothyroidism is treated with medicine that replaces the hormones that your body does not make. After you begin treatment, it may take several weeks for symptoms to go away. Follow these instructions at home:  Take medicines only as directed by your health care provider.  If you start taking any new medicines, tell your health care provider.  Keep all follow-up visits as directed by your health care provider. This is important. As your condition improves, your dosage needs may change. You will need to have blood tests regularly so that your health care provider can watch your condition. Contact a health care provider if:  Your symptoms do not get better with treatment.  You are taking thyroid  replacement medicine and: ? You sweat excessively. ? You have tremors. ? You feel anxious. ? You lose weight rapidly. ? You cannot tolerate heat. ? You have emotional swings. ? You have diarrhea. ? You feel weak. Get help right away if:  You develop chest pain.  You develop an irregular heartbeat.  You develop a rapid heartbeat. This information is not intended to replace advice given to you by your health care provider. Make sure you discuss any questions you have with your health care provider. Document Released: 09/03/2005 Document Revised: 02/09/2016 Document Reviewed: 01/19/2014 Elsevier Interactive Patient Education  2017 Elsevier Inc.  

## 2016-10-18 NOTE — Progress Notes (Signed)
Pre visit review using our clinic review tool, if applicable. No additional management support is needed unless otherwise documented below in the visit note. 

## 2016-10-22 ENCOUNTER — Encounter: Payer: Self-pay | Admitting: Internal Medicine

## 2016-11-28 ENCOUNTER — Encounter: Payer: Self-pay | Admitting: Internal Medicine

## 2016-12-03 ENCOUNTER — Other Ambulatory Visit: Payer: Self-pay | Admitting: Internal Medicine

## 2016-12-03 DIAGNOSIS — B351 Tinea unguium: Secondary | ICD-10-CM

## 2016-12-03 MED ORDER — TERBINAFINE HCL 250 MG PO TABS: 250.0000 mg | ORAL_TABLET | Freq: Every day | ORAL | 0 refills | Status: DC

## 2016-12-20 ENCOUNTER — Other Ambulatory Visit: Payer: Self-pay | Admitting: Internal Medicine

## 2016-12-20 DIAGNOSIS — E038 Other specified hypothyroidism: Secondary | ICD-10-CM

## 2017-01-01 ENCOUNTER — Other Ambulatory Visit: Payer: Self-pay | Admitting: Internal Medicine

## 2017-01-01 DIAGNOSIS — E781 Pure hyperglyceridemia: Secondary | ICD-10-CM

## 2017-01-30 DIAGNOSIS — L718 Other rosacea: Secondary | ICD-10-CM | POA: Diagnosis not present

## 2017-01-30 DIAGNOSIS — H2513 Age-related nuclear cataract, bilateral: Secondary | ICD-10-CM | POA: Diagnosis not present

## 2017-03-19 ENCOUNTER — Encounter: Payer: Self-pay | Admitting: Internal Medicine

## 2017-03-19 ENCOUNTER — Other Ambulatory Visit: Payer: Self-pay | Admitting: Internal Medicine

## 2017-03-19 MED ORDER — ZOSTER VAC RECOMB ADJUVANTED 50 MCG/0.5ML IM SUSR: 0.5000 mL | Freq: Once | INTRAMUSCULAR | 1 refills | Status: AC

## 2017-03-21 ENCOUNTER — Other Ambulatory Visit: Payer: Self-pay | Admitting: Internal Medicine

## 2017-03-21 DIAGNOSIS — E038 Other specified hypothyroidism: Secondary | ICD-10-CM

## 2017-04-17 ENCOUNTER — Ambulatory Visit (INDEPENDENT_AMBULATORY_CARE_PROVIDER_SITE_OTHER): Payer: Medicare Other | Admitting: Internal Medicine

## 2017-04-17 ENCOUNTER — Other Ambulatory Visit (INDEPENDENT_AMBULATORY_CARE_PROVIDER_SITE_OTHER): Payer: Medicare Other

## 2017-04-17 ENCOUNTER — Encounter: Payer: Self-pay | Admitting: Internal Medicine

## 2017-04-17 VITALS — BP 138/88 | HR 78 | Temp 98.2°F | Resp 16 | Ht 64.0 in | Wt 217.1 lb

## 2017-04-17 DIAGNOSIS — M48062 Spinal stenosis, lumbar region with neurogenic claudication: Secondary | ICD-10-CM | POA: Diagnosis not present

## 2017-04-17 DIAGNOSIS — M5412 Radiculopathy, cervical region: Secondary | ICD-10-CM

## 2017-04-17 DIAGNOSIS — L719 Rosacea, unspecified: Secondary | ICD-10-CM | POA: Diagnosis not present

## 2017-04-17 DIAGNOSIS — E213 Hyperparathyroidism, unspecified: Secondary | ICD-10-CM

## 2017-04-17 DIAGNOSIS — I10 Essential (primary) hypertension: Secondary | ICD-10-CM

## 2017-04-17 DIAGNOSIS — M170 Bilateral primary osteoarthritis of knee: Secondary | ICD-10-CM | POA: Diagnosis not present

## 2017-04-17 DIAGNOSIS — E038 Other specified hypothyroidism: Secondary | ICD-10-CM

## 2017-04-17 DIAGNOSIS — Z1231 Encounter for screening mammogram for malignant neoplasm of breast: Secondary | ICD-10-CM

## 2017-04-17 LAB — COMPREHENSIVE METABOLIC PANEL
ALK PHOS: 58 U/L (ref 39–117)
ALT: 17 U/L (ref 0–35)
AST: 15 U/L (ref 0–37)
Albumin: 4.5 g/dL (ref 3.5–5.2)
BILIRUBIN TOTAL: 0.8 mg/dL (ref 0.2–1.2)
BUN: 24 mg/dL — ABNORMAL HIGH (ref 6–23)
CALCIUM: 9.6 mg/dL (ref 8.4–10.5)
CO2: 26 mEq/L (ref 19–32)
Chloride: 104 mEq/L (ref 96–112)
Creatinine, Ser: 1.28 mg/dL — ABNORMAL HIGH (ref 0.40–1.20)
GFR: 43.53 mL/min — AB (ref >60.00)
GLUCOSE: 103 mg/dL — AB (ref 70–99)
POTASSIUM: 4.1 meq/L (ref 3.5–5.1)
Sodium: 138 mEq/L (ref 135–145)
TOTAL PROTEIN: 6.9 g/dL (ref 6.0–8.3)

## 2017-04-17 LAB — VITAMIN D 25 HYDROXY (VIT D DEFICIENCY, FRACTURES): VITD: 67.69 ng/mL (ref 30.00–100.00)

## 2017-04-17 MED ORDER — HYDROCODONE-ACETAMINOPHEN 5-325 MG PO TABS: 1.0000 | ORAL_TABLET | Freq: Four times a day (QID) | ORAL | 0 refills | Status: DC | PRN

## 2017-04-17 MED ORDER — TRETINOIN 0.025 % EX CREA: TOPICAL_CREAM | Freq: Every day | CUTANEOUS | 5 refills | Status: DC

## 2017-04-17 MED ORDER — MELOXICAM 7.5 MG PO TABS: 7.5000 mg | ORAL_TABLET | Freq: Every day | ORAL | 1 refills | Status: DC

## 2017-04-17 NOTE — Patient Instructions (Signed)
Hypothyroidism Hypothyroidism is a disorder of the thyroid. The thyroid is a large gland that is located in the lower front of the neck. The thyroid releases hormones that control how the body works. With hypothyroidism, the thyroid does not make enough of these hormones. What are the causes? Causes of hypothyroidism may include:  Viral infections.  Pregnancy.  Your own defense system (immune system) attacking your thyroid.  Certain medicines.  Birth defects.  Past radiation treatments to your head or neck.  Past treatment with radioactive iodine.  Past surgical removal of part or all of your thyroid.  Problems with the gland that is located in the center of your brain (pituitary).  What are the signs or symptoms? Signs and symptoms of hypothyroidism may include:  Feeling as though you have no energy (lethargy).  Inability to tolerate cold.  Weight gain that is not explained by a change in diet or exercise habits.  Dry skin.  Coarse hair.  Menstrual irregularity.  Slowing of thought processes.  Constipation.  Sadness or depression.  How is this diagnosed? Your health care provider may diagnose hypothyroidism with blood tests and ultrasound tests. How is this treated? Hypothyroidism is treated with medicine that replaces the hormones that your body does not make. After you begin treatment, it may take several weeks for symptoms to go away. Follow these instructions at home:  Take medicines only as directed by your health care provider.  If you start taking any new medicines, tell your health care provider.  Keep all follow-up visits as directed by your health care provider. This is important. As your condition improves, your dosage needs may change. You will need to have blood tests regularly so that your health care provider can watch your condition. Contact a health care provider if:  Your symptoms do not get better with treatment.  You are taking thyroid  replacement medicine and: ? You sweat excessively. ? You have tremors. ? You feel anxious. ? You lose weight rapidly. ? You cannot tolerate heat. ? You have emotional swings. ? You have diarrhea. ? You feel weak. Get help right away if:  You develop chest pain.  You develop an irregular heartbeat.  You develop a rapid heartbeat. This information is not intended to replace advice given to you by your health care provider. Make sure you discuss any questions you have with your health care provider. Document Released: 09/03/2005 Document Revised: 02/09/2016 Document Reviewed: 01/19/2014 Elsevier Interactive Patient Education  2017 Elsevier Inc.  

## 2017-04-17 NOTE — Progress Notes (Signed)
Subjective:  Patient ID: Leslie Edwards, female    DOB: [birth year]  Age: 71 y.o. MRN: [id number]  CC: Hypertension; Hypothyroidism; Back Pain; and Osteoarthritis   HPI Leslie Edwards presents for f/up - she offers no new complaints today. She asked that I refill her medication for rosacea because she tells me her dermatologist has suddenly retired and is no longer available.  Outpatient Medications Prior to Visit  Medication Sig Dispense Refill  . albuterol (PROVENTIL HFA;VENTOLIN HFA) 108 (90 Base) MCG/ACT inhaler Inhale 2 puffs into the lungs every 6 (six) hours as needed. 18 g 11  . budesonide-formoterol (SYMBICORT) 160-4.5 MCG/ACT inhaler Inhale 2 puffs into the lungs 2 (two) times daily. 1 Inhaler 11  . buPROPion (WELLBUTRIN XL) 150 MG 24 hr tablet TAKE 2 TABLETS IN THE MORNING AND 1 TABLET IN THE EVENING. 270 tablet 1  . fexofenadine-pseudoephedrine (ALLEGRA-D) 60-120 MG per tablet Take 1 tablet by mouth daily as needed.      . furosemide (LASIX) 40 MG tablet TAKE 1 TABLET TWICE DAILY. 60 tablet 11  . hyoscyamine (LEVSIN SL) 0.125 MG SL tablet Place 1 tablet (0.125 mg total) under the tongue every 4 (four) hours as needed. 30 tablet 11  . ipratropium-albuterol (DUONEB) 0.5-2.5 (3) MG/3ML SOLN Take 3 mLs by nebulization as needed. 360 mL 1  . levothyroxine (SYNTHROID, LEVOTHROID) 50 MCG tablet TAKE 1 TABLET ONCE DAILY. 90 tablet 1  . liothyronine (CYTOMEL) 25 MCG tablet TAKE 1 TABLET ONCE DAILY. 90 tablet 0  . liothyronine (CYTOMEL) 5 MCG tablet TAKE 1 TABLET ONCE DAILY. 90 tablet 3  . mometasone (NASONEX) 50 MCG/ACT nasal spray USE 2 SPRAYS IN EACH NOSTRIL ONCE DAILY 17 g 11  . omega-3 acid ethyl esters (LOVAZA) 1 g capsule TAKE (2) CAPSULES TWICE DAILY. 120 capsule 11  . Omeprazole-Sodium Bicarbonate (ZEGERID) 20-1100 MG CAPS Take 1 capsule by mouth at bedtime as needed.     . potassium chloride (K-DUR) 10 MEQ tablet TAKE 1 TABLET DAILY. 90 tablet 3  . terbinafine (LAMISIL) 250 MG  tablet Take 1 tablet (250 mg total) by mouth daily. Take one tablet QD for one week of each month for one year 84 tablet 0  . VIIBRYD 40 MG TABS TAKE 1 TABLET ONCE DAILY. 90 tablet 3  . ALPRAZolam (XANAX) 0.25 MG tablet Take 1 tablet (0.25 mg total) by mouth 2 (two) times daily as needed for anxiety. 35 tablet 3  . meloxicam (MOBIC) 15 MG tablet Take 1 tablet (15 mg total) by mouth daily. 90 tablet 1  . tretinoin (RETIN-A) 0.025 % cream Reported on 12/21/2015     No facility-administered medications prior to visit.     ROS Review of Systems  Constitutional: Positive for unexpected weight change (wt gain). Negative for appetite change, chills, diaphoresis and fatigue.  HENT: Negative.  Negative for trouble swallowing.   Eyes: Negative for visual disturbance.  Respiratory: Positive for cough. Negative for chest tightness, shortness of breath and wheezing.        Chronic, mild, intermittent nonproductive cough  Cardiovascular: Negative for chest pain, palpitations and leg swelling.  Gastrointestinal: Negative for abdominal pain.  Endocrine: Negative.   Genitourinary: Negative.  Negative for difficulty urinating.  Musculoskeletal: Positive for arthralgias, back pain and neck pain. Negative for neck stiffness.       She complains of chronic, bilateral knee pain. She takes Norco for pain. I previously prescribed meloxicam but she has not been taking it. She doesn't know why  she's not taking it.  Skin: Negative.  Negative for color change.  Allergic/Immunologic: Negative.   Neurological: Negative.  Negative for dizziness, weakness, numbness and headaches.  Hematological: Negative for adenopathy. Does not bruise/bleed easily.  Psychiatric/Behavioral: Negative.     Objective:  BP 138/88 (BP Location: Left Arm, Patient Position: Sitting, Cuff Size: Normal)   Pulse 78   Temp 98.2 F (36.8 C) (Oral)   Resp 16   Ht 5\' 4"  (1.626 m)   Wt 217 lb 2 oz (98.5 kg)   SpO2 98%   BMI 37.27 kg/m   BP  Readings from Last 3 Encounters:  04/17/17 138/88  10/18/16 128/80  09/27/16 128/90    Wt Readings from Last 3 Encounters:  04/17/17 217 lb 2 oz (98.5 kg)  10/18/16 210 lb 12 oz (95.6 kg)  09/27/16 208 lb 8 oz (94.6 kg)    Physical Exam  Constitutional: She is oriented to person, place, and time. No distress.  HENT:  Mouth/Throat: Oropharynx is clear and moist. No oropharyngeal exudate.  Eyes: Conjunctivae are normal. Right eye exhibits no discharge. Left eye exhibits no discharge. No scleral icterus.  Neck: Normal range of motion. Neck supple. No JVD present. No thyromegaly present.  Cardiovascular: Normal rate, regular rhythm and intact distal pulses.   No murmur heard. Pulmonary/Chest: Effort normal and breath sounds normal. No respiratory distress. She has no wheezes. She has no rales. She exhibits no tenderness.  Abdominal: Soft. Bowel sounds are normal. She exhibits no distension and no mass. There is no tenderness. There is no rebound and no guarding.  Musculoskeletal: Normal range of motion. She exhibits no edema, tenderness or deformity.  Lymphadenopathy:    She has no cervical adenopathy.  Neurological: She is alert and oriented to person, place, and time.  Skin: Skin is warm and dry. No rash noted. She is not diaphoretic. No erythema. No pallor.  Vitals reviewed.   Lab Results  Component Value Date   WBC 6.0 07/30/2016   HGB 13.7 07/30/2016   HCT 39.8 07/30/2016   PLT 254.0 07/30/2016   GLUCOSE 103 (H) 04/17/2017   CHOL 238 (H) 10/18/2016   TRIG 84.0 10/18/2016   HDL 92.50 10/18/2016   LDLDIRECT 111.0 10/31/2015   LDLCALC 129 (H) 10/18/2016   ALT 17 04/17/2017   AST 15 04/17/2017   NA 138 04/17/2017   K 4.1 04/17/2017   CL 104 04/17/2017   CREATININE 1.28 (H) 04/17/2017   BUN 24 (H) 04/17/2017   CO2 26 04/17/2017   TSH 0.50 04/17/2017   HGBA1C 5.3 04/28/2009    Ct Maxillofacial Ltd Wo Cm  Result Date: 10/10/2016 CLINICAL DATA:  Sinus drainage,  hoarseness, cough, nasal congestion. EXAM: CT PARANASAL SINUS LIMITED WITHOUT CONTRAST TECHNIQUE: Non-contiguous multidetector CT images of the paranasal sinuses were obtained in a single plane without contrast. COMPARISON:  None. FINDINGS: No mucosal thickening or air-fluid levels in the paranasal sinuses. Visualized orbital and facial soft tissues and bony structures unremarkable. IMPRESSION: No evidence of sinusitis. Electronically Signed   By: Rolm Baptise M.D.   On: 10/10/2016 09:26    Assessment & Plan:   Aziya was seen today for hypertension, hypothyroidism, back pain and osteoarthritis.  Diagnoses and all orders for this visit:  Essential hypertension- her blood pressure is adequately well controlled. Electrolytes are normal and renal function is stable. -     Comprehensive metabolic panel; Future  Other specified hypothyroidism- her TSH is in the normal range. She will remain on the  current dose of levothyroxine. -     Thyroid Panel With TSH; Future  Parathyroid hormone excess ([place])- her calcium and PTH level are normal now. No intervention needed. -     Comprehensive metabolic panel; Future -     VITAMIN D 25 Hydroxy (Vit-D Deficiency, Fractures); Future -     PTH, intact and calcium; Future  Rosacea, acne -     tretinoin (RETIN-A) 0.025 % cream; Apply topically at bedtime. Reported on 12/21/2015  Primary osteoarthritis of both knees- will restart meloxicam for relief of the pain. -     meloxicam (MOBIC) 7.5 MG tablet; Take 1 tablet (7.5 mg total) by mouth daily. -     HYDROcodone-acetaminophen (NORCO/VICODIN) 5-325 MG tablet; Take 1 tablet by mouth every 6 (six) hours as needed for moderate pain.  Radiculitis of right cervical region -     meloxicam (MOBIC) 7.5 MG tablet; Take 1 tablet (7.5 mg total) by mouth daily. -     HYDROcodone-acetaminophen (NORCO/VICODIN) 5-325 MG tablet; Take 1 tablet by mouth every 6 (six) hours as needed for moderate pain.  Spinal stenosis, lumbar  region, with neurogenic claudication -     meloxicam (MOBIC) 7.5 MG tablet; Take 1 tablet (7.5 mg total) by mouth daily. -     HYDROcodone-acetaminophen (NORCO/VICODIN) 5-325 MG tablet; Take 1 tablet by mouth every 6 (six) hours as needed for moderate pain.  Visit for screening mammogram -     MM DIGITAL SCREENING BILATERAL; Future   I have discontinued Ms. Childress's ALPRAZolam and meloxicam. I have also changed her tretinoin. Additionally, I am having her start on meloxicam and HYDROcodone-acetaminophen. Lastly, I am having her maintain her fexofenadine-pseudoephedrine, Omeprazole-Sodium Bicarbonate, hyoscyamine, furosemide, budesonide-formoterol, VIIBRYD, liothyronine, potassium chloride, albuterol, ipratropium-albuterol, mometasone, terbinafine, levothyroxine, buPROPion, omega-3 acid ethyl esters, and liothyronine.  Meds ordered this encounter  Medications  . tretinoin (RETIN-A) 0.025 % cream    Sig: Apply topically at bedtime. Reported on 12/21/2015    Dispense:  45 g    Refill:  5  . meloxicam (MOBIC) 7.5 MG tablet    Sig: Take 1 tablet (7.5 mg total) by mouth daily.    Dispense:  90 tablet    Refill:  1  . HYDROcodone-acetaminophen (NORCO/VICODIN) 5-325 MG tablet    Sig: Take 1 tablet by mouth every 6 (six) hours as needed for moderate pain.    Dispense:  90 tablet    Refill:  0     Follow-up: Return in about 6 months (around 10/18/2017).  Scarlette Calico, MD

## 2017-04-18 ENCOUNTER — Encounter: Payer: Self-pay | Admitting: Internal Medicine

## 2017-04-18 LAB — THYROID PANEL WITH TSH
FREE THYROXINE INDEX: 1.1 — AB (ref 1.4–3.8)
T3 UPTAKE: 31 % (ref 22–35)
T4, Total: 3.5 ug/dL — ABNORMAL LOW (ref 4.5–12.0)
TSH: 0.5 m[IU]/L

## 2017-04-18 LAB — PTH, INTACT AND CALCIUM
CALCIUM: 9.5 mg/dL (ref 8.6–10.4)
PTH: 52 pg/mL (ref 14–64)

## 2017-06-20 ENCOUNTER — Other Ambulatory Visit: Payer: Self-pay | Admitting: Internal Medicine

## 2017-06-20 DIAGNOSIS — E038 Other specified hypothyroidism: Secondary | ICD-10-CM

## 2017-06-24 ENCOUNTER — Other Ambulatory Visit: Payer: Self-pay

## 2017-06-24 MED ORDER — BUPROPION HCL ER (XL) 150 MG PO TB24: ORAL_TABLET | ORAL | 1 refills | Status: DC

## 2017-06-24 NOTE — Telephone Encounter (Signed)
Refaxed Buproprion with corrected SIG as was faxed for #[phone] with error of 1qd/corrected to 2qam; 1qhs/thx dmf

## 2017-07-02 ENCOUNTER — Ambulatory Visit (INDEPENDENT_AMBULATORY_CARE_PROVIDER_SITE_OTHER): Payer: Medicare Other

## 2017-07-02 DIAGNOSIS — Z23 Encounter for immunization: Secondary | ICD-10-CM | POA: Diagnosis not present

## 2017-08-06 ENCOUNTER — Encounter: Payer: Self-pay | Admitting: Internal Medicine

## 2017-09-16 ENCOUNTER — Other Ambulatory Visit: Payer: Self-pay | Admitting: Internal Medicine

## 2017-10-01 ENCOUNTER — Other Ambulatory Visit: Payer: Self-pay | Admitting: Internal Medicine

## 2017-10-01 DIAGNOSIS — F418 Other specified anxiety disorders: Secondary | ICD-10-CM

## 2017-10-15 ENCOUNTER — Other Ambulatory Visit: Payer: Self-pay | Admitting: Internal Medicine

## 2017-10-15 DIAGNOSIS — M5412 Radiculopathy, cervical region: Secondary | ICD-10-CM

## 2017-10-15 DIAGNOSIS — M170 Bilateral primary osteoarthritis of knee: Secondary | ICD-10-CM

## 2017-10-15 DIAGNOSIS — M48062 Spinal stenosis, lumbar region with neurogenic claudication: Secondary | ICD-10-CM

## 2017-10-21 ENCOUNTER — Encounter: Payer: Self-pay | Admitting: Internal Medicine

## 2017-10-21 ENCOUNTER — Other Ambulatory Visit (INDEPENDENT_AMBULATORY_CARE_PROVIDER_SITE_OTHER): Payer: Medicare Other

## 2017-10-21 ENCOUNTER — Ambulatory Visit: Payer: Medicare Other | Admitting: Internal Medicine

## 2017-10-21 VITALS — BP 136/80 | HR 64 | Temp 98.3°F | Resp 16 | Ht 64.0 in | Wt 190.2 lb

## 2017-10-21 DIAGNOSIS — E213 Hyperparathyroidism, unspecified: Secondary | ICD-10-CM

## 2017-10-21 DIAGNOSIS — I10 Essential (primary) hypertension: Secondary | ICD-10-CM | POA: Diagnosis not present

## 2017-10-21 DIAGNOSIS — N184 Chronic kidney disease, stage 4 (severe): Secondary | ICD-10-CM

## 2017-10-21 DIAGNOSIS — E038 Other specified hypothyroidism: Secondary | ICD-10-CM

## 2017-10-21 DIAGNOSIS — E781 Pure hyperglyceridemia: Secondary | ICD-10-CM

## 2017-10-21 LAB — CBC WITH DIFFERENTIAL/PLATELET
BASOS ABS: 0.1 10*3/uL (ref 0.0–0.1)
Basophils Relative: 1.3 % (ref 0.0–3.0)
EOS ABS: 0.4 10*3/uL (ref 0.0–0.7)
Eosinophils Relative: 5.7 % — ABNORMAL HIGH (ref 0.0–5.0)
HEMATOCRIT: 38.9 % (ref 36.0–46.0)
Hemoglobin: 13.4 g/dL (ref 12.0–15.0)
LYMPHS ABS: 1.7 10*3/uL (ref 0.7–4.0)
Lymphocytes Relative: 27.5 % (ref 12.0–46.0)
MCHC: 34.5 g/dL (ref 30.0–36.0)
MCV: 99.5 fl (ref 78.0–100.0)
MONO ABS: 0.6 10*3/uL (ref 0.1–1.0)
Monocytes Relative: 9.6 % (ref 3.0–12.0)
NEUTROS ABS: 3.5 10*3/uL (ref 1.4–7.7)
NEUTROS PCT: 55.9 % (ref 43.0–77.0)
PLATELETS: 270 10*3/uL (ref 150.0–400.0)
RBC: 3.91 Mil/uL (ref 3.87–5.11)
RDW: 13.4 % (ref 11.5–15.5)
WBC: 6.2 10*3/uL (ref 4.0–10.5)

## 2017-10-21 LAB — COMPREHENSIVE METABOLIC PANEL
ALK PHOS: 55 U/L (ref 39–117)
ALT: 17 U/L (ref 0–35)
AST: 15 U/L (ref 0–37)
Albumin: 4.6 g/dL (ref 3.5–5.2)
BUN: 29 mg/dL — AB (ref 6–23)
CHLORIDE: 102 meq/L (ref 96–112)
CO2: 28 mEq/L (ref 19–32)
Calcium: 9.6 mg/dL (ref 8.4–10.5)
Creatinine, Ser: 1.37 mg/dL — ABNORMAL HIGH (ref 0.40–1.20)
GFR: 40.19 mL/min — AB (ref >60.00)
Glucose, Bld: 106 mg/dL — ABNORMAL HIGH (ref 70–99)
POTASSIUM: 4.5 meq/L (ref 3.5–5.1)
SODIUM: 141 meq/L (ref 135–145)
TOTAL PROTEIN: 7.2 g/dL (ref 6.0–8.3)
Total Bilirubin: 0.5 mg/dL (ref 0.2–1.2)

## 2017-10-21 LAB — LIPID PANEL
CHOLESTEROL: 202 mg/dL — AB (ref 0–200)
HDL: 110.8 mg/dL (ref >39.00)
LDL Cholesterol: 79 mg/dL (ref 0–99)
NonHDL: 91.27
TRIGLYCERIDES: 63 mg/dL (ref 0.0–149.0)
Total CHOL/HDL Ratio: 2
VLDL: 12.6 mg/dL (ref 0.0–40.0)

## 2017-10-21 LAB — TSH: TSH: 0.72 u[IU]/mL (ref 0.35–4.50)

## 2017-10-21 NOTE — Progress Notes (Signed)
Subjective:  Patient ID: Leslie Edwards, female    DOB: 1946/03/05  Age: 72 y.o. MRN: [id number]  CC: Hypertension and Hypothyroidism   HPI Leslie Edwards presents for f/up - She has intentionally lost weight since I last saw her.  She complains of fatigue and cold intolerance.  She has her usual level of chronic pain.  She denies any recent episodes of CP, DOE, edema, or palpitations.  Outpatient Medications Prior to Visit  Medication Sig Dispense Refill  . albuterol (PROVENTIL HFA;VENTOLIN HFA) 108 (90 Base) MCG/ACT inhaler Inhale 2 puffs into the lungs every 6 (six) hours as needed. 18 g 11  . budesonide-formoterol (SYMBICORT) 160-4.5 MCG/ACT inhaler Inhale 2 puffs into the lungs 2 (two) times daily. 1 Inhaler 11  . buPROPion (WELLBUTRIN XL) 150 MG 24 hr tablet Take 2qam; take 1qpm 270 tablet 1  . fexofenadine-pseudoephedrine (ALLEGRA-D) 60-120 MG per tablet Take 1 tablet by mouth daily as needed.      . furosemide (LASIX) 40 MG tablet Take 1 tablet (40 mg total) by mouth 2 (two) times daily. 180 tablet 1  . HYDROcodone-acetaminophen (NORCO/VICODIN) 5-325 MG tablet Take 1 tablet by mouth every 6 (six) hours as needed for moderate pain. 90 tablet 0  . hyoscyamine (LEVSIN SL) 0.125 MG SL tablet Place 1 tablet (0.125 mg total) under the tongue every 4 (four) hours as needed. 30 tablet 11  . ipratropium-albuterol (DUONEB) 0.5-2.5 (3) MG/3ML SOLN Take 3 mLs by nebulization as needed. 360 mL 1  . levothyroxine (SYNTHROID, LEVOTHROID) 50 MCG tablet Take 1 tablet (50 mcg total) by mouth daily. 90 tablet 1  . liothyronine (CYTOMEL) 25 MCG tablet Take 1 tablet (25 mcg total) by mouth daily. 90 tablet 1  . liothyronine (CYTOMEL) 5 MCG tablet TAKE 1 TABLET ONCE DAILY. 90 tablet 0  . meloxicam (MOBIC) 7.5 MG tablet TAKE 1 TABLET ONCE DAILY. 90 tablet 0  . mometasone (NASONEX) 50 MCG/ACT nasal spray USE 2 SPRAYS IN EACH NOSTRIL ONCE DAILY 17 g 11  . omega-3 acid ethyl esters (LOVAZA) 1 g capsule  TAKE (2) CAPSULES TWICE DAILY. 120 capsule 11  . Omeprazole-Sodium Bicarbonate (ZEGERID) 20-1100 MG CAPS Take 1 capsule by mouth at bedtime as needed.     . potassium chloride (K-DUR) 10 MEQ tablet TAKE 1 TABLET BY MOUTH DAILY. 90 tablet 0  . terbinafine (LAMISIL) 250 MG tablet Take 1 tablet (250 mg total) by mouth daily. Take one tablet QD for one week of each month for one year 84 tablet 0  . tretinoin (RETIN-A) 0.025 % cream Apply topically at bedtime. Reported on 12/21/2015 45 g 5  . VIIBRYD 40 MG TABS TAKE 1 TABLET ONCE DAILY. 90 tablet 1  . potassium chloride (K-DUR) 10 MEQ tablet TAKE 1 TABLET DAILY. 90 tablet 3   No facility-administered medications prior to visit.     ROS Review of Systems  Constitutional: Positive for fatigue and unexpected weight change. Negative for appetite change, chills, diaphoresis and fever.  HENT: Negative for trouble swallowing.   Eyes: Negative for visual disturbance.  Respiratory: Negative for cough, chest tightness, shortness of breath and wheezing.   Cardiovascular: Negative for chest pain, palpitations and leg swelling.  Gastrointestinal: Negative for abdominal pain, constipation, diarrhea, nausea and vomiting.  Endocrine: Positive for cold intolerance. Negative for heat intolerance.  Genitourinary: Negative.  Negative for difficulty urinating.  Musculoskeletal: Positive for back pain and neck pain. Negative for gait problem and myalgias.  Skin: Negative.  Negative for  color change and rash.  Allergic/Immunologic: Negative.   Neurological: Negative.  Negative for dizziness, weakness, light-headedness and numbness.  Hematological: Negative for adenopathy. Does not bruise/bleed easily.  Psychiatric/Behavioral: Negative.     Objective:  BP 136/80 (BP Location: Left Arm, Patient Position: Sitting, Cuff Size: Large)   Pulse 64   Temp 98.3 F (36.8 C) (Oral)   Resp 16   Ht 5\' 4"  (1.626 m)   Wt 190 lb 4 oz (86.3 kg)   SpO2 98%   BMI 32.66 kg/m    BP Readings from Last 3 Encounters:  10/21/17 136/80  04/17/17 138/88  10/18/16 128/80    Wt Readings from Last 3 Encounters:  10/21/17 190 lb 4 oz (86.3 kg)  04/17/17 217 lb 2 oz (98.5 kg)  10/18/16 210 lb 12 oz (95.6 kg)    Physical Exam  Constitutional: She is oriented to person, place, and time. No distress.  HENT:  Mouth/Throat: Oropharynx is clear and moist. No oropharyngeal exudate.  Eyes: Conjunctivae are normal. Left eye exhibits no discharge. No scleral icterus.  Neck: Normal range of motion. Neck supple. No JVD present. No thyromegaly present.  Cardiovascular: Normal rate, regular rhythm and normal heart sounds. Exam reveals no gallop.  No murmur heard. Pulmonary/Chest: Effort normal and breath sounds normal. No respiratory distress. She has no wheezes. She has no rales.  Abdominal: Soft. Bowel sounds are normal. She exhibits no distension and no mass. There is no tenderness.  Musculoskeletal: Normal range of motion. She exhibits no edema, tenderness or deformity.  Lymphadenopathy:    She has no cervical adenopathy.  Neurological: She is alert and oriented to person, place, and time.  Skin: Skin is warm and dry. No rash noted. She is not diaphoretic. No erythema. No pallor.  Psychiatric: She has a normal mood and affect. Her behavior is normal. Judgment and thought content normal.  Vitals reviewed.   Lab Results  Component Value Date   WBC 6.2 10/21/2017   HGB 13.4 10/21/2017   HCT 38.9 10/21/2017   PLT 270.0 10/21/2017   GLUCOSE 106 (H) 10/21/2017   CHOL 202 (H) 10/21/2017   TRIG 63.0 10/21/2017   HDL 110.80 10/21/2017   LDLDIRECT 111.0 10/31/2015   LDLCALC 79 10/21/2017   ALT 17 10/21/2017   AST 15 10/21/2017   NA 141 10/21/2017   K 4.5 10/21/2017   CL 102 10/21/2017   CREATININE 1.37 (H) 10/21/2017   BUN 29 (H) 10/21/2017   CO2 28 10/21/2017   TSH 0.72 10/21/2017   HGBA1C 5.3 04/28/2009    Ct Maxillofacial Ltd Wo Cm  Result Date:  10/10/2016 CLINICAL DATA:  Sinus drainage, hoarseness, cough, nasal congestion. EXAM: CT PARANASAL SINUS LIMITED WITHOUT CONTRAST TECHNIQUE: Non-contiguous multidetector CT images of the paranasal sinuses were obtained in a single plane without contrast. COMPARISON:  None. FINDINGS: No mucosal thickening or air-fluid levels in the paranasal sinuses. Visualized orbital and facial soft tissues and bony structures unremarkable. IMPRESSION: No evidence of sinusitis. Electronically Signed   By: Rolm Baptise M.D.   On: 10/10/2016 09:26    Assessment & Plan:   Amaka was seen today for hypertension and hypothyroidism.  Diagnoses and all orders for this visit:  Parathyroid hormone excess ([place])- Her calcium level remains normal.  She is having no complications related to this. -     Comprehensive metabolic panel; Future  Other specified hypothyroidism- Her TSH is in the normal range.  She will remain on the current dose of levothyroxine. -  Lipid panel; Future  Essential hypertension- Her blood pressure is well controlled.  Electrolytes and renal function are normal. -     CBC with Differential/Platelet; Future -     TSH; Future  Kidney disease, chronic, stage IV (GFR 15-29 ml/min) ([place])- Her renal function is stable.  Will continue to maintain good blood pressure control.  She agrees to avoid nephrotoxic agents. -     Comprehensive metabolic panel; Future  Pure hyperglyceridemia   I am having Ineta H. Fournier maintain her fexofenadine-pseudoephedrine, Omeprazole-Sodium Bicarbonate, hyoscyamine, budesonide-formoterol, albuterol, ipratropium-albuterol, mometasone, terbinafine, omega-3 acid ethyl esters, tretinoin, HYDROcodone-acetaminophen, levothyroxine, liothyronine, furosemide, buPROPion, liothyronine, potassium chloride, VIIBRYD, and meloxicam.  No orders of the defined types were placed in this encounter.    Follow-up: Return in about 6 months (around 04/20/2018).  Scarlette Calico, MD

## 2017-10-21 NOTE — Patient Instructions (Signed)
Hypothyroidism Hypothyroidism is a disorder of the thyroid. The thyroid is a large gland that is located in the lower front of the neck. The thyroid releases hormones that control how the body works. With hypothyroidism, the thyroid does not make enough of these hormones. What are the causes? Causes of hypothyroidism may include:  Viral infections.  Pregnancy.  Your own defense system (immune system) attacking your thyroid.  Certain medicines.  Birth defects.  Past radiation treatments to your head or neck.  Past treatment with radioactive iodine.  Past surgical removal of part or all of your thyroid.  Problems with the gland that is located in the center of your brain (pituitary).  What are the signs or symptoms? Signs and symptoms of hypothyroidism may include:  Feeling as though you have no energy (lethargy).  Inability to tolerate cold.  Weight gain that is not explained by a change in diet or exercise habits.  Dry skin.  Coarse hair.  Menstrual irregularity.  Slowing of thought processes.  Constipation.  Sadness or depression.  How is this diagnosed? Your health care provider may diagnose hypothyroidism with blood tests and ultrasound tests. How is this treated? Hypothyroidism is treated with medicine that replaces the hormones that your body does not make. After you begin treatment, it may take several weeks for symptoms to go away. Follow these instructions at home:  Take medicines only as directed by your health care provider.  If you start taking any new medicines, tell your health care provider.  Keep all follow-up visits as directed by your health care provider. This is important. As your condition improves, your dosage needs may change. You will need to have blood tests regularly so that your health care provider can watch your condition. Contact a health care provider if:  Your symptoms do not get better with treatment.  You are taking thyroid  replacement medicine and: ? You sweat excessively. ? You have tremors. ? You feel anxious. ? You lose weight rapidly. ? You cannot tolerate heat. ? You have emotional swings. ? You have diarrhea. ? You feel weak. Get help right away if:  You develop chest pain.  You develop an irregular heartbeat.  You develop a rapid heartbeat. This information is not intended to replace advice given to you by your health care provider. Make sure you discuss any questions you have with your health care provider. Document Released: 09/03/2005 Document Revised: 02/09/2016 Document Reviewed: 01/19/2014 Elsevier Interactive Patient Education  2018 [employer]  

## 2017-10-24 ENCOUNTER — Encounter: Payer: Self-pay | Admitting: Internal Medicine

## 2017-12-17 ENCOUNTER — Other Ambulatory Visit: Payer: Self-pay | Admitting: Internal Medicine

## 2017-12-17 DIAGNOSIS — E038 Other specified hypothyroidism: Secondary | ICD-10-CM

## 2017-12-19 ENCOUNTER — Other Ambulatory Visit: Payer: Self-pay | Admitting: Internal Medicine

## 2017-12-19 DIAGNOSIS — B351 Tinea unguium: Secondary | ICD-10-CM

## 2018-01-15 ENCOUNTER — Other Ambulatory Visit: Payer: Self-pay | Admitting: Internal Medicine

## 2018-01-15 DIAGNOSIS — M5412 Radiculopathy, cervical region: Secondary | ICD-10-CM

## 2018-01-15 DIAGNOSIS — M48062 Spinal stenosis, lumbar region with neurogenic claudication: Secondary | ICD-10-CM

## 2018-01-15 DIAGNOSIS — M170 Bilateral primary osteoarthritis of knee: Secondary | ICD-10-CM

## 2018-03-15 ENCOUNTER — Other Ambulatory Visit: Payer: Self-pay | Admitting: Internal Medicine

## 2018-03-15 DIAGNOSIS — E781 Pure hyperglyceridemia: Secondary | ICD-10-CM

## 2018-03-28 DIAGNOSIS — L718 Other rosacea: Secondary | ICD-10-CM | POA: Diagnosis not present

## 2018-03-28 DIAGNOSIS — H2513 Age-related nuclear cataract, bilateral: Secondary | ICD-10-CM | POA: Diagnosis not present

## 2018-04-15 ENCOUNTER — Other Ambulatory Visit: Payer: Self-pay | Admitting: Internal Medicine

## 2018-04-15 DIAGNOSIS — F418 Other specified anxiety disorders: Secondary | ICD-10-CM

## 2018-04-21 ENCOUNTER — Ambulatory Visit: Payer: Medicare Other | Admitting: Internal Medicine

## 2018-04-21 ENCOUNTER — Ambulatory Visit: Payer: Medicare Other

## 2018-04-28 LAB — HM DIABETES EYE EXAM

## 2018-04-29 ENCOUNTER — Other Ambulatory Visit: Payer: Self-pay | Admitting: Internal Medicine

## 2018-04-29 DIAGNOSIS — M48062 Spinal stenosis, lumbar region with neurogenic claudication: Secondary | ICD-10-CM

## 2018-04-29 DIAGNOSIS — M170 Bilateral primary osteoarthritis of knee: Secondary | ICD-10-CM

## 2018-04-29 DIAGNOSIS — M5412 Radiculopathy, cervical region: Secondary | ICD-10-CM

## 2018-04-30 NOTE — Progress Notes (Addendum)
Subjective:   Leslie Edwards is a 72 y.o. female who presents for an Initial Medicare Annual Wellness Visit.  Review of Systems    No ROS.  Medicare Wellness Visit. Additional risk factors are reflected in the social history.   Cardiac Risk Factors include: advanced age (>68men, >13 women);dyslipidemia;hypertension;obesity (BMI >30kg/m2) Sleep patterns: gets up 1-2 times nightly to void and sleeps 4-6 hours nightly. Patient reports insomnia issues, discussed recommended sleep tips and stress reduction tips, Relevant patient education assigned to patient using Emmi.  Home Safety/Smoke Alarms: Feels safe in home. Smoke alarms in place.  Living environment; residence and Firearm Safety: 1-story house/ trailer, no firearms. Lives with husband, no needs for DME, good support system Seat Belt Safety/Bike Helmet: Wears seat belt.     Objective:    Today's Vitals   05/01/18 1447 05/01/18 1454  BP: (!) 142/80   Pulse: 75   Resp: 18   Temp: 98.4 F (36.9 C)   SpO2: 99%   Weight: 199 lb (90.3 kg)   Height: 5\' 4"  (1.626 m)   PainSc:  2    Body mass index is 34.16 kg/m.  Advanced Directives 05/01/2018  Does Patient Have a Medical Advance Directive? Yes  Type of Paramedic of [place];Living will  Copy of [place] in Chart? No - copy requested    Current Medications (verified) Outpatient Encounter Medications as of 05/01/2018  Medication Sig  . albuterol (PROVENTIL HFA;VENTOLIN HFA) 108 (90 Base) MCG/ACT inhaler Inhale 2 puffs into the lungs every 6 (six) hours as needed.  . budesonide-formoterol (SYMBICORT) 160-4.5 MCG/ACT inhaler Inhale 2 puffs into the lungs 2 (two) times daily.  Marland Kitchen buPROPion (WELLBUTRIN XL) 150 MG 24 hr tablet TAKE 2 TABLETS IN THE MORNING AND 1 TABLET IN THE EVENING.  . fexofenadine-pseudoephedrine (ALLEGRA-D) 60-120 MG per tablet Take 1 tablet by mouth daily as needed.    . furosemide (LASIX) 40 MG tablet TAKE 1  TABLET BY MOUTH TWICE DAILY.  Marland Kitchen HYDROcodone-acetaminophen (NORCO/VICODIN) 5-325 MG tablet Take 1 tablet by mouth every 6 (six) hours as needed for moderate pain.  . hyoscyamine (LEVSIN SL) 0.125 MG SL tablet Place 1 tablet (0.125 mg total) under the tongue every 4 (four) hours as needed.  Marland Kitchen ipratropium-albuterol (DUONEB) 0.5-2.5 (3) MG/3ML SOLN Take 3 mLs by nebulization as needed.  Marland Kitchen levothyroxine (SYNTHROID, LEVOTHROID) 50 MCG tablet TAKE 1 TABLET ONCE DAILY.  Marland Kitchen liothyronine (CYTOMEL) 25 MCG tablet TAKE 1 TABLET ONCE DAILY.  Marland Kitchen liothyronine (CYTOMEL) 5 MCG tablet TAKE 1 TABLET ONCE DAILY.  . meloxicam (MOBIC) 7.5 MG tablet TAKE 1 TABLET ONCE DAILY.  . mometasone (NASONEX) 50 MCG/ACT nasal spray USE 2 SPRAYS IN EACH NOSTRIL ONCE DAILY  . omega-3 acid ethyl esters (LOVAZA) 1 g capsule TAKE (2) CAPSULES BY MOUTH TWICE DAILY.  Marland Kitchen Omeprazole-Sodium Bicarbonate (ZEGERID) 20-1100 MG CAPS Take 1 capsule by mouth at bedtime as needed.   . potassium chloride (K-DUR) 10 MEQ tablet TAKE 1 TABLET BY MOUTH DAILY.  Marland Kitchen tretinoin (RETIN-A) 0.025 % cream Apply topically at bedtime. Reported on 12/21/2015  . VIIBRYD 40 MG TABS TAKE 1 TABLET ONCE DAILY.  . [DISCONTINUED] K-DUR 10 MEQ tablet TAKE 1 TABLET DAILY.  . [DISCONTINUED] potassium chloride (K-DUR) 10 MEQ tablet TAKE 1 TABLET BY MOUTH DAILY. (Patient not taking: Reported on 05/01/2018)  . [DISCONTINUED] terbinafine (LAMISIL) 250 MG tablet Take 1 tablet (250 mg total) by mouth daily. Take one tablet QD for one week of  each month for one year (Patient not taking: Reported on 05/01/2018)   No facility-administered encounter medications on file as of 05/01/2018.     Allergies (verified) Penicillins and Prochlorperazine edisylate   History: Past Medical History:  Diagnosis Date  . Allergy   . ANEMIA, B12 DEFICIENCY 01/28/2009   Qualifier: Diagnosis of  By: Arnoldo Morale MD, Balinda Quails   . Anxiety   . Asthma   . GERD (gastroesophageal reflux disease)   . Hypertension     . Hypothyroidism   . OSTEOPOROSIS 05/06/2007   Qualifier: Diagnosis of  By: Arnoldo Morale MD, Balinda Quails   . Parathyroid hormone excess ([place]) 08/31/2013   Secondary to low dietary calcium/calcium malabsorption   . PLANTAR FASCIITIS, LEFT 07/05/2009   Qualifier: Diagnosis of  By: Arnoldo Morale MD, [place] 02/25/2009   Qualifier: Diagnosis of  By: Arnoldo Morale MD, Abelino Derrick   . UNSPECIFIED IRON DEFICIENCY ANEMIA 01/28/2009   Qualifier: Diagnosis of  By: Arnoldo Morale MD, Balinda Quails VENOUS INSUFFICIENCY, CHRONIC 04/28/2007   Qualifier: Diagnosis of  By: Scherrie Gerlach     Past Surgical History:  Procedure Laterality Date  . CARPAL TUNNEL RELEASE    . KNEE ARTHROSCOPY     right  . LIPOMA EXCISION  1965   axilary  . nissan fundiplication    . TONSILLECTOMY     Family History  Problem Relation Age of Onset  . Cancer Mother        lung  . Macular degeneration Father   . Arthritis Father   . Lymphoma Son        died age 96  . Dementia Maternal Grandmother   . Stroke Maternal Grandmother   . Healthy Brother   . Cancer Maternal Grandfather        esophageal cancer  . Cancer Paternal Grandmother        leukemia  . Healthy Brother   . Cancer Maternal Uncle        colon  . Cancer Maternal Aunt        esophageal cancer   Social History   Socioeconomic History  . Marital status: Married    Spouse name: Not on file  . Number of children: 1  . Years of education: Not on file  . Highest education level: Not on file  Occupational History  . Not on file  Social Needs  . Financial resource strain: Somewhat hard  . Food insecurity:    Worry: Never true    Inability: Never true  . Transportation needs:    Medical: No    Non-medical: No  Tobacco Use  . Smoking status: Former Smoker    Last attempt to quit: 01/22/1975    Years since quitting: 43.3  . Smokeless tobacco: Never Used  Substance and Sexual Activity  . Alcohol use: Yes    Alcohol/week: 3.0 standard drinks    Types: 3  Glasses of Konica Stankowski per week  . Drug use: No  . Sexual activity: Not Currently  Lifestyle  . Physical activity:    Days per week: 2 days    Minutes per session: 30 min  . Stress: Rather much  Relationships  . Social connections:    Talks on phone: More than three times a week    Gets together: More than three times a week    Attends religious service: 1 to 4 times per year    Active member of club or organization:  No    Attends meetings of clubs or organizations: Never    Relationship status: Married  Other Topics Concern  . Not on file  Social History Narrative   Regular exercise: Not walking   Works 3 days per week at the [employer]   Caffeine use: yes    Tobacco Counseling Counseling given: Not Answered  Immunizations and Health Maintenance Immunization History  Administered Date(s) Administered  . Influenza Split 07/23/2011, 06/24/2012  . Influenza Whole 07/18/2007, 06/14/2008, 06/13/2010  . Influenza, High Dose Seasonal PF 07/24/2013, 06/12/2016, 07/02/2017  . Influenza,inj,Quad PF,6+ Mos 06/03/2014, 06/30/2015  . Pneumococcal Conjugate-13 07/24/2013  . Pneumococcal Polysaccharide-23 06/30/2015  . Td 12/17/1998  . Tdap 06/24/2012  . Zoster 05/01/2011  . Zoster Recombinat (Shingrix) 03/18/2018   Health Maintenance Due  Topic Date Due  . Hepatitis C Screening  1946/07/01  . MAMMOGRAM  07/15/2016  . INFLUENZA VACCINE  04/17/2018    Patient Care Team: Janith Lima, MD as PCP - General (Internal Medicine)  Indicate any recent Medical Services you may have received from other than [facility] providers in the past year (date may be approximate).     Assessment:   This is a routine wellness examination for Dhwani. Physical assessment deferred to PCP.   Hearing/Vision screen Hearing Screening Comments: Able to hear conversational tones w/o difficulty. No issues reported.  Passed whisper test Vision Screening Comments: appointment yearly Dr.  Darlys Gales  Dietary issues and exercise activities discussed: Current Exercise Habits: Home exercise routine(chair exercise print-outs provided), Type of exercise: walking, Time (Minutes): 30, Frequency (Times/Week): 2, Weekly Exercise (Minutes/Week): 60, Intensity: Mild, Exercise limited by: orthopedic condition(s)  Diet (meal preparation, eat out, water intake, caffeinated beverages, dairy products, fruits and vegetables): in general, a "healthy" diet   Poor appetite at times  Reviewed heart healthy diet. Encouraged patient to increase daily water and healthy fluid intake. Discussed supplementing with ensure (samples and coupons provided)  Goals    . Patient Stated     I want to start to do yoga and take at least 30 minutes per day just for me.       Depression Screen PHQ 2/9 Scores 05/01/2018 10/21/2017 04/17/2017 06/30/2015 06/03/2014 11/30/2013  PHQ - 2 Score 2 2 3  0 0 1  PHQ- 9 Score 8 11 14  - - -    Fall Risk Fall Risk  05/01/2018 04/17/2017 06/30/2015 06/03/2014 11/30/2013  Falls in the past year? Yes No Yes No Yes  Number falls in past yr: 1 - 2 or more - 2 or more  Injury with Fall? No - No - -  Risk for fall due to : Impaired balance/gait;Impaired mobility - - - -  Follow up Falls prevention discussed - - - -   Cognitive Function:       Ad8 score reviewed for issues:  Issues making decisions: no  Less interest in hobbies / activities: no  Repeats questions, stories (family complaining): no  Trouble using ordinary gadgets (microwave, computer, phone):no  Forgets the month or year: no  Mismanaging finances: no  Remembering appts: no  Daily problems with thinking and/or memory: no Ad8 score is= 0  Screening Tests Health Maintenance  Topic Date Due  . Hepatitis C Screening  Sep 24, 1945  . MAMMOGRAM  07/15/2016  . INFLUENZA VACCINE  04/17/2018  . COLONOSCOPY  04/01/2019  . TETANUS/TDAP  06/24/2022  . DEXA SCAN  Completed  . PNA vac Low Risk Adult  Completed  Plan:     Continue doing brain stimulating activities (puzzles, reading, adult coloring books, staying active) to keep memory sharp.   Continue to eat heart healthy diet (full of fruits, vegetables, whole grains, lean protein, water--limit salt, fat, and sugar intake) and increase physical activity as tolerated.   I have personally reviewed and noted the following in the patient's chart:   . Medical and social history . Use of alcohol, tobacco or illicit drugs  . Current medications and supplements . Functional ability and status . Nutritional status . Physical activity . Advanced directives . List of other physicians . Vitals . Screenings to include cognitive, depression, and falls . Referrals and appointments  In addition, I have reviewed and discussed with patient certain preventive protocols, quality metrics, and best practice recommendations. A written personalized care plan for preventive services as well as general preventive health recommendations were provided to patient.     Michiel Cowboy, RN   05/01/2018    Medical screening examination/treatment/procedure(s) were performed by non-physician practitioner and as supervising physician I was immediately available for consultation/collaboration. I agree with above. Scarlette Calico, MD

## 2018-05-01 ENCOUNTER — Other Ambulatory Visit (INDEPENDENT_AMBULATORY_CARE_PROVIDER_SITE_OTHER): Payer: Medicare Other

## 2018-05-01 ENCOUNTER — Encounter: Payer: Self-pay | Admitting: Internal Medicine

## 2018-05-01 ENCOUNTER — Ambulatory Visit (INDEPENDENT_AMBULATORY_CARE_PROVIDER_SITE_OTHER): Payer: Medicare Other | Admitting: *Deleted

## 2018-05-01 ENCOUNTER — Ambulatory Visit: Payer: Medicare Other | Admitting: Internal Medicine

## 2018-05-01 VITALS — BP 142/80 | HR 75 | Temp 98.4°F | Resp 18 | Ht 64.0 in | Wt 199.0 lb

## 2018-05-01 VITALS — BP 142/80 | HR 75 | Temp 98.4°F | Resp 16 | Ht 64.0 in | Wt 199.0 lb

## 2018-05-01 DIAGNOSIS — E213 Hyperparathyroidism, unspecified: Secondary | ICD-10-CM | POA: Diagnosis not present

## 2018-05-01 DIAGNOSIS — E038 Other specified hypothyroidism: Secondary | ICD-10-CM

## 2018-05-01 DIAGNOSIS — N184 Chronic kidney disease, stage 4 (severe): Secondary | ICD-10-CM

## 2018-05-01 DIAGNOSIS — I10 Essential (primary) hypertension: Secondary | ICD-10-CM

## 2018-05-01 DIAGNOSIS — Z0000 Encounter for general adult medical examination without abnormal findings: Secondary | ICD-10-CM

## 2018-05-01 LAB — BASIC METABOLIC PANEL
BUN: 30 mg/dL — AB (ref 6–23)
CO2: 29 mEq/L (ref 19–32)
Calcium: 10 mg/dL (ref 8.4–10.5)
Chloride: 105 mEq/L (ref 96–112)
Creatinine, Ser: 1.36 mg/dL — ABNORMAL HIGH (ref 0.40–1.20)
GFR: 40.48 mL/min — AB (ref >60.00)
Glucose, Bld: 90 mg/dL (ref 70–99)
POTASSIUM: 4.1 meq/L (ref 3.5–5.1)
SODIUM: 142 meq/L (ref 135–145)

## 2018-05-01 LAB — CBC WITH DIFFERENTIAL/PLATELET
Basophils Absolute: 0.1 10*3/uL (ref 0.0–0.1)
Basophils Relative: 1.4 % (ref 0.0–3.0)
EOS ABS: 0.5 10*3/uL (ref 0.0–0.7)
EOS PCT: 7.2 % — AB (ref 0.0–5.0)
HEMATOCRIT: 39.6 % (ref 36.0–46.0)
HEMOGLOBIN: 13.5 g/dL (ref 12.0–15.0)
Lymphocytes Relative: 24.8 % (ref 12.0–46.0)
Lymphs Abs: 1.7 10*3/uL (ref 0.7–4.0)
MCHC: 34 g/dL (ref 30.0–36.0)
MCV: 99 fl (ref 78.0–100.0)
MONO ABS: 0.9 10*3/uL (ref 0.1–1.0)
Monocytes Relative: 13 % — ABNORMAL HIGH (ref 3.0–12.0)
Neutro Abs: 3.7 10*3/uL (ref 1.4–7.7)
Neutrophils Relative %: 53.6 % (ref 43.0–77.0)
Platelets: 248 10*3/uL (ref 150.0–400.0)
RBC: 4 Mil/uL (ref 3.87–5.11)
RDW: 14.4 % (ref 11.5–15.5)
WBC: 6.9 10*3/uL (ref 4.0–10.5)

## 2018-05-01 NOTE — Patient Instructions (Signed)
Hypothyroidism Hypothyroidism is a disorder of the thyroid. The thyroid is a large gland that is located in the lower front of the neck. The thyroid releases hormones that control how the body works. With hypothyroidism, the thyroid does not make enough of these hormones. What are the causes? Causes of hypothyroidism may include:  Viral infections.  Pregnancy.  Your own defense system (immune system) attacking your thyroid.  Certain medicines.  Birth defects.  Past radiation treatments to your head or neck.  Past treatment with radioactive iodine.  Past surgical removal of part or all of your thyroid.  Problems with the gland that is located in the center of your brain (pituitary).  What are the signs or symptoms? Signs and symptoms of hypothyroidism may include:  Feeling as though you have no energy (lethargy).  Inability to tolerate cold.  Weight gain that is not explained by a change in diet or exercise habits.  Dry skin.  Coarse hair.  Menstrual irregularity.  Slowing of thought processes.  Constipation.  Sadness or depression.  How is this diagnosed? Your health care provider may diagnose hypothyroidism with blood tests and ultrasound tests. How is this treated? Hypothyroidism is treated with medicine that replaces the hormones that your body does not make. After you begin treatment, it may take several weeks for symptoms to go away. Follow these instructions at home:  Take medicines only as directed by your health care provider.  If you start taking any new medicines, tell your health care provider.  Keep all follow-up visits as directed by your health care provider. This is important. As your condition improves, your dosage needs may change. You will need to have blood tests regularly so that your health care provider can watch your condition. Contact a health care provider if:  Your symptoms do not get better with treatment.  You are taking thyroid  replacement medicine and: ? You sweat excessively. ? You have tremors. ? You feel anxious. ? You lose weight rapidly. ? You cannot tolerate heat. ? You have emotional swings. ? You have diarrhea. ? You feel weak. Get help right away if:  You develop chest pain.  You develop an irregular heartbeat.  You develop a rapid heartbeat. This information is not intended to replace advice given to you by your health care provider. Make sure you discuss any questions you have with your health care provider. Document Released: 09/03/2005 Document Revised: 02/09/2016 Document Reviewed: 01/19/2014 Elsevier Interactive Patient Education  2018 [employer]  

## 2018-05-01 NOTE — Progress Notes (Signed)
Subjective:  Patient ID: Leslie Edwards, female    DOB: 1946/08/18  Age: 72 y.o. MRN: [id number]  CC: Hypertension and Hypothyroidism   HPI [name] presents for f/up - She complains of fatigue and chronic pain.  She tells me her blood pressure has been well controlled.  She denies any recent episodes of CP, DOE, palpitations, or edema.  Outpatient Medications Prior to Visit  Medication Sig Dispense Refill  . albuterol (PROVENTIL HFA;VENTOLIN HFA) 108 (90 Base) MCG/ACT inhaler Inhale 2 puffs into the lungs every 6 (six) hours as needed. 18 g 11  . budesonide-formoterol (SYMBICORT) 160-4.5 MCG/ACT inhaler Inhale 2 puffs into the lungs 2 (two) times daily. 1 Inhaler 11  . buPROPion (WELLBUTRIN XL) 150 MG 24 hr tablet TAKE 2 TABLETS IN THE MORNING AND 1 TABLET IN THE EVENING. 270 tablet 0  . fexofenadine-pseudoephedrine (ALLEGRA-D) 60-120 MG per tablet Take 1 tablet by mouth daily as needed.      . furosemide (LASIX) 40 MG tablet TAKE 1 TABLET BY MOUTH TWICE DAILY. 180 tablet 0  . HYDROcodone-acetaminophen (NORCO/VICODIN) 5-325 MG tablet Take 1 tablet by mouth every 6 (six) hours as needed for moderate pain. 90 tablet 0  . hyoscyamine (LEVSIN SL) 0.125 MG SL tablet Place 1 tablet (0.125 mg total) under the tongue every 4 (four) hours as needed. 30 tablet 11  . ipratropium-albuterol (DUONEB) 0.5-2.5 (3) MG/3ML SOLN Take 3 mLs by nebulization as needed. 360 mL 1  . levothyroxine (SYNTHROID, LEVOTHROID) 50 MCG tablet TAKE 1 TABLET ONCE DAILY. 90 tablet 1  . liothyronine (CYTOMEL) 25 MCG tablet TAKE 1 TABLET ONCE DAILY. 90 tablet 1  . liothyronine (CYTOMEL) 5 MCG tablet TAKE 1 TABLET ONCE DAILY. 90 tablet 1  . meloxicam (MOBIC) 7.5 MG tablet TAKE 1 TABLET ONCE DAILY. 90 tablet 0  . mometasone (NASONEX) 50 MCG/ACT nasal spray USE 2 SPRAYS IN EACH NOSTRIL ONCE DAILY 17 g 11  . omega-3 acid ethyl esters (LOVAZA) 1 g capsule TAKE (2) CAPSULES BY MOUTH TWICE DAILY. 360 capsule 0  .  Omeprazole-Sodium Bicarbonate (ZEGERID) 20-1100 MG CAPS Take 1 capsule by mouth at bedtime as needed.     . potassium chloride (K-DUR) 10 MEQ tablet TAKE 1 TABLET BY MOUTH DAILY. 90 tablet 0  . tretinoin (RETIN-A) 0.025 % cream Apply topically at bedtime. Reported on 12/21/2015 45 g 5  . VIIBRYD 40 MG TABS TAKE 1 TABLET ONCE DAILY. 90 tablet 1   No facility-administered medications prior to visit.     ROS Review of Systems  Constitutional: Positive for fatigue. Negative for diaphoresis.  HENT: Negative.  Negative for trouble swallowing.   Eyes: Negative for visual disturbance.  Gastrointestinal: Negative for abdominal pain, constipation, diarrhea, nausea and vomiting.  Endocrine: Negative.  Negative for cold intolerance and heat intolerance.  Genitourinary: Negative.  Negative for difficulty urinating and dysuria.  Musculoskeletal: Positive for back pain. Negative for myalgias and neck pain.  Skin: Negative.  Negative for color change and pallor.  Neurological: Negative.  Negative for dizziness, weakness and light-headedness.  Hematological: Negative for adenopathy. Does not bruise/bleed easily.  Psychiatric/Behavioral: Negative.     Objective:  BP (!) 142/80 (BP Location: Left Arm, Patient Position: Sitting, Cuff Size: Normal)   Pulse 75   Temp 98.4 F (36.9 C) (Oral)   Resp 16   Ht 5\' 4"  (1.626 m)   Wt 199 lb (90.3 kg)   SpO2 98%   BMI 34.16 kg/m   BP Readings from  Last 3 Encounters:  05/01/18 (!) 142/80  05/01/18 (!) 142/80  10/21/17 136/80    Wt Readings from Last 3 Encounters:  05/01/18 199 lb (90.3 kg)  05/01/18 199 lb (90.3 kg)  10/21/17 190 lb 4 oz (86.3 kg)    Physical Exam  Constitutional: She is oriented to person, place, and time. No distress.  HENT:  Mouth/Throat: Oropharynx is clear and moist. No oropharyngeal exudate.  Eyes: Conjunctivae are normal. No scleral icterus.  Neck: Normal range of motion. Neck supple. No JVD present. No thyromegaly present.    Cardiovascular: Normal rate, regular rhythm and normal heart sounds. Exam reveals no gallop and no friction rub.  No murmur heard. Pulmonary/Chest: Effort normal and breath sounds normal. No respiratory distress. She has no wheezes. She has no rales.  Abdominal: Soft. Bowel sounds are normal. She exhibits no mass. There is no hepatosplenomegaly. There is no tenderness.  Musculoskeletal: Normal range of motion. She exhibits no edema, tenderness or deformity.  Lymphadenopathy:    She has no cervical adenopathy.  Neurological: She is alert and oriented to person, place, and time.  Skin: Skin is warm and dry. She is not diaphoretic. No pallor.  Psychiatric: She has a normal mood and affect. Her behavior is normal. Judgment and thought content normal.  Vitals reviewed.   Lab Results  Component Value Date   WBC 6.9 05/01/2018   HGB 13.5 05/01/2018   HCT 39.6 05/01/2018   PLT 248.0 05/01/2018   GLUCOSE 90 05/01/2018   CHOL 202 (H) 10/21/2017   TRIG 63.0 10/21/2017   HDL 110.80 10/21/2017   LDLDIRECT 111.0 10/31/2015   LDLCALC 79 10/21/2017   ALT 17 10/21/2017   AST 15 10/21/2017   NA 142 05/01/2018   K 4.1 05/01/2018   CL 105 05/01/2018   CREATININE 1.36 (H) 05/01/2018   BUN 30 (H) 05/01/2018   CO2 29 05/01/2018   TSH 1.05 05/01/2018   HGBA1C 5.3 04/28/2009    Ct Maxillofacial Ltd Wo Cm  Result Date: 10/10/2016 CLINICAL DATA:  Sinus drainage, hoarseness, cough, nasal congestion. EXAM: CT PARANASAL SINUS LIMITED WITHOUT CONTRAST TECHNIQUE: Non-contiguous multidetector CT images of the paranasal sinuses were obtained in a single plane without contrast. COMPARISON:  None. FINDINGS: No mucosal thickening or air-fluid levels in the paranasal sinuses. Visualized orbital and facial soft tissues and bony structures unremarkable. IMPRESSION: No evidence of sinusitis. Electronically Signed   By: Rolm Baptise M.D.   On: 10/10/2016 09:26    Assessment & Plan:   Chee was seen today for  hypertension and hypothyroidism.  Diagnoses and all orders for this visit:  Essential hypertension- Her blood pressure is well controlled.  Electrolytes are normal.  Renal function is stable. -     CBC with Differential/Platelet; Future -     Basic metabolic panel; Future  Other specified hypothyroidism- Her TFTs are in the normal range.  She will remain on the current thyroid supplement. -     Thyroid Panel With TSH; Future  Parathyroid hormone excess ([place])- Her calcium level remains in the normal range. -     Basic metabolic panel; Future  Kidney disease, chronic, stage IV (GFR 15-29 ml/min) ([place])- Her renal function is stable.  She will avoid nephrotoxic agents and will maintain strict blood pressure control. -     CBC with Differential/Platelet; Future -     Basic metabolic panel; Future  Morbid obesity ([place])- She is working on her lifestyle modifications to lose weight.   I am  having Chareese H. Barella maintain her fexofenadine-pseudoephedrine, Omeprazole-Sodium Bicarbonate, hyoscyamine, budesonide-formoterol, albuterol, ipratropium-albuterol, mometasone, tretinoin, HYDROcodone-acetaminophen, potassium chloride, liothyronine, levothyroxine, liothyronine, furosemide, buPROPion, omega-3 acid ethyl esters, VIIBRYD, and meloxicam.  No orders of the defined types were placed in this encounter.    Follow-up: Return in about 6 months (around 11/01/2018).  Scarlette Calico, MD

## 2018-05-01 NOTE — Patient Instructions (Addendum)
Continue doing brain stimulating activities (puzzles, reading, adult coloring books, staying active) to keep memory sharp.   Continue to eat heart healthy diet (full of fruits, vegetables, whole grains, lean protein, water--limit salt, fat, and sugar intake) and increase physical activity as tolerated.   Leslie Edwards , Thank you for taking time to come for your Medicare Wellness Visit. I appreciate your ongoing commitment to your health goals. Please review the following plan we discussed and let me know if I can assist you in the future.   These are the goals we discussed: Goals    . Patient Stated     I want to start to do yoga and take at least 30 minutes per day just for me.        This is a list of the screening recommended for you and due dates:  Health Maintenance  Topic Date Due  .  Hepatitis C: One time screening is recommended by Center for Disease Control  (CDC) for  adults born from [birth year] through 1966.   10/18/1945  . Mammogram  07/15/2016  . Flu Shot  04/17/2018  . Colon Cancer Screening  04/01/2019  . Tetanus Vaccine  06/24/2022  . DEXA scan (bone density measurement)  Completed  . Pneumonia vaccines  Completed      Knee Exercises Ask your health care provider which exercises are safe for you. Do exercises exactly as told by your health care provider and adjust them as directed. It is normal to feel mild stretching, pulling, tightness, or discomfort as you do these exercises, but you should stop right away if you feel sudden pain or your pain gets worse.Do not begin these exercises until told by your health care provider. STRETCHING AND RANGE OF MOTION EXERCISES These exercises warm up your muscles and joints and improve the movement and flexibility of your knee. These exercises also help to relieve pain, numbness, and tingling. Exercise A: Knee Extension, Prone 1. Lie on your abdomen on a bed. 2. Place your left / right knee just beyond the edge of the surface so your  knee is not on the bed. You can put a towel under your left / right thigh just above your knee for comfort. 3. Relax your leg muscles and allow gravity to straighten your knee. You should feel a stretch behind your left / right knee. 4. Hold this position for __________ seconds. 5. Scoot up so your knee is supported between repetitions. Repeat __________ times. Complete this stretch __________ times a day. Exercise B: Knee Flexion, Active  1. Lie on your back with both knees straight. If this causes back discomfort, bend your left / right knee so your foot is flat on the floor. 2. Slowly slide your left / right heel back toward your buttocks until you feel a gentle stretch in the front of your knee or thigh. 3. Hold this position for __________ seconds. 4. Slowly slide your left / right heel back to the starting position. Repeat __________ times. Complete this exercise __________ times a day. Exercise C: Quadriceps, Prone  1. Lie on your abdomen on a firm surface, such as a bed or padded floor. 2. Bend your left / right knee and hold your ankle. If you cannot reach your ankle or pant leg, loop a belt around your foot and grab the belt instead. 3. Gently pull your heel toward your buttocks. Your knee should not slide out to the side. You should feel a stretch in the front of  your thigh and knee. 4. Hold this position for __________ seconds. Repeat __________ times. Complete this stretch __________ times a day. Exercise D: Hamstring, Supine 1. Lie on your back. 2. Loop a belt or towel over the ball of your left / right foot. The ball of your foot is on the walking surface, right under your toes. 3. Straighten your left / right knee and slowly pull on the belt to raise your leg until you feel a gentle stretch behind your knee. ? Do not let your left / right knee bend while you do this. ? Keep your other leg flat on the floor. 4. Hold this position for __________ seconds. Repeat __________  times. Complete this stretch __________ times a day. STRENGTHENING EXERCISES These exercises build strength and endurance in your knee. Endurance is the ability to use your muscles for a long time, even after they get tired. Exercise E: Quadriceps, Isometric  1. Lie on your back with your left / right leg extended and your other knee bent. Put a rolled towel or small pillow under your knee if told by your health care provider. 2. Slowly tense the muscles in the front of your left / right thigh. You should see your kneecap slide up toward your hip or see increased dimpling just above the knee. This motion will push the back of the knee toward the floor. 3. For __________ seconds, keep the muscle as tight as you can without increasing your pain. 4. Relax the muscles slowly and completely. Repeat __________ times. Complete this exercise __________ times a day. Exercise F: Straight Leg Raises - Quadriceps 1. Lie on your back with your left / right leg extended and your other knee bent. 2. Tense the muscles in the front of your left / right thigh. You should see your kneecap slide up or see increased dimpling just above the knee. Your thigh may even shake a bit. 3. Keep these muscles tight as you raise your leg 4-6 inches (10-15 cm) off the floor. Do not let your knee bend. 4. Hold this position for __________ seconds. 5. Keep these muscles tense as you lower your leg. 6. Relax your muscles slowly and completely after each repetition. Repeat __________ times. Complete this exercise __________ times a day. Exercise G: Hamstring, Isometric 1. Lie on your back on a firm surface. 2. Bend your left / right knee approximately __________ degrees. 3. Dig your left / right heel into the surface as if you are trying to pull it toward your buttocks. Tighten the muscles in the back of your thighs to dig as hard as you can without increasing any pain. 4. Hold this position for __________ seconds. 5. Release the  tension gradually and allow your muscles to relax completely for __________ seconds after each repetition. Repeat __________ times. Complete this exercise __________ times a day. Exercise H: Hamstring Curls  If told by your health care provider, do this exercise while wearing ankle weights. Begin with __________ weights. Then increase the weight by 1 lb (0.5 kg) increments. Do not wear ankle weights that are more than __________. 1. Lie on your abdomen with your legs straight. 2. Bend your left / right knee as far as you can without feeling pain. Keep your hips flat against the floor. 3. Hold this position for __________ seconds. 4. Slowly lower your leg to the starting position.  Repeat __________ times. Complete this exercise __________ times a day. Exercise I: Squats (Quadriceps) 1. Stand in front of a  table, with your feet and knees pointing straight ahead. You may rest your hands on the table for balance but not for support. 2. Slowly bend your knees and lower your hips like you are going to sit in a chair. ? Keep your weight over your heels, not over your toes. ? Keep your lower legs upright so they are parallel with the table legs. ? Do not let your hips go lower than your knees. ? Do not bend lower than told by your health care provider. ? If your knee pain increases, do not bend as low. 3. Hold the squat position for __________ seconds. 4. Slowly push with your legs to return to standing. Do not use your hands to pull yourself to standing. Repeat __________ times. Complete this exercise __________ times a day. Exercise J: Wall Slides (Quadriceps)  1. Lean your back against a smooth wall or door while you walk your feet out 18-24 inches (46-61 cm) from it. 2. Place your feet hip-width apart. 3. Slowly slide down the wall or door until your knees bend __________ degrees. Keep your knees over your heels, not over your toes. Keep your knees in line with your hips. 4. Hold for  __________ seconds. Repeat __________ times. Complete this exercise __________ times a day. Exercise K: Straight Leg Raises - Hip Abductors 1. Lie on your side with your left / right leg in the top position. Lie so your head, shoulder, knee, and hip line up. You may bend your bottom knee to help you keep your balance. 2. Roll your hips slightly forward so your hips are stacked directly over each other and your left / right knee is facing forward. 3. Leading with your heel, lift your top leg 4-6 inches (10-15 cm). You should feel the muscles in your outer hip lifting. ? Do not let your foot drift forward. ? Do not let your knee roll toward the ceiling. 4. Hold this position for __________ seconds. 5. Slowly return your leg to the starting position. 6. Let your muscles relax completely after each repetition. Repeat __________ times. Complete this exercise __________ times a day. Exercise L: Straight Leg Raises - Hip Extensors 1. Lie on your abdomen on a firm surface. You can put a pillow under your hips if that is more comfortable. 2. Tense the muscles in your buttocks and lift your left / right leg about 4-6 inches (10-15 cm). Keep your knee straight as you lift your leg. 3. Hold this position for __________ seconds. 4. Slowly lower your leg to the starting position. 5. Let your leg relax completely after each repetition. Repeat __________ times. Complete this exercise __________ times a day. This information is not intended to replace advice given to you by your health care provider. Make sure you discuss any questions you have with your health care provider. Document Released: 07/18/2005 Document Revised: 05/28/2016 Document Reviewed: 07/10/2015 Elsevier Interactive Patient Education  2018 [employer].

## 2018-05-02 ENCOUNTER — Encounter: Payer: Self-pay | Admitting: Internal Medicine

## 2018-05-02 LAB — THYROID PANEL WITH TSH
Free Thyroxine Index: 1.4 (ref 1.4–3.8)
T3 UPTAKE: 35 % (ref 22–35)
T4, Total: 3.9 ug/dL — ABNORMAL LOW (ref 5.1–11.9)
TSH: 1.05 m[IU]/L (ref 0.40–4.50)

## 2018-07-03 ENCOUNTER — Ambulatory Visit (INDEPENDENT_AMBULATORY_CARE_PROVIDER_SITE_OTHER): Payer: Medicare Other

## 2018-07-03 DIAGNOSIS — Z23 Encounter for immunization: Secondary | ICD-10-CM

## 2018-07-09 DIAGNOSIS — L718 Other rosacea: Secondary | ICD-10-CM | POA: Diagnosis not present

## 2018-07-09 DIAGNOSIS — H02883 Meibomian gland dysfunction of right eye, unspecified eyelid: Secondary | ICD-10-CM | POA: Diagnosis not present

## 2018-07-09 DIAGNOSIS — H2512 Age-related nuclear cataract, left eye: Secondary | ICD-10-CM | POA: Diagnosis not present

## 2018-07-09 DIAGNOSIS — H02403 Unspecified ptosis of bilateral eyelids: Secondary | ICD-10-CM | POA: Diagnosis not present

## 2018-07-09 DIAGNOSIS — H02886 Meibomian gland dysfunction of left eye, unspecified eyelid: Secondary | ICD-10-CM | POA: Diagnosis not present

## 2018-07-09 DIAGNOSIS — Z961 Presence of intraocular lens: Secondary | ICD-10-CM | POA: Diagnosis not present

## 2018-07-09 DIAGNOSIS — H2513 Age-related nuclear cataract, bilateral: Secondary | ICD-10-CM | POA: Diagnosis not present

## 2018-07-09 DIAGNOSIS — Z888 Allergy status to other drugs, medicaments and biological substances status: Secondary | ICD-10-CM | POA: Diagnosis not present

## 2018-07-09 DIAGNOSIS — Z880 Allergy status to penicillin: Secondary | ICD-10-CM | POA: Diagnosis not present

## 2018-07-15 ENCOUNTER — Other Ambulatory Visit: Payer: Self-pay | Admitting: Internal Medicine

## 2018-07-15 DIAGNOSIS — E038 Other specified hypothyroidism: Secondary | ICD-10-CM

## 2018-07-17 DIAGNOSIS — R600 Localized edema: Secondary | ICD-10-CM | POA: Diagnosis not present

## 2018-07-17 DIAGNOSIS — H2512 Age-related nuclear cataract, left eye: Secondary | ICD-10-CM | POA: Diagnosis not present

## 2018-07-17 DIAGNOSIS — H25812 Combined forms of age-related cataract, left eye: Secondary | ICD-10-CM | POA: Diagnosis not present

## 2018-07-17 DIAGNOSIS — I10 Essential (primary) hypertension: Secondary | ICD-10-CM | POA: Diagnosis not present

## 2018-07-18 DIAGNOSIS — H02883 Meibomian gland dysfunction of right eye, unspecified eyelid: Secondary | ICD-10-CM | POA: Diagnosis not present

## 2018-07-18 DIAGNOSIS — H02403 Unspecified ptosis of bilateral eyelids: Secondary | ICD-10-CM | POA: Diagnosis not present

## 2018-07-18 DIAGNOSIS — Z888 Allergy status to other drugs, medicaments and biological substances status: Secondary | ICD-10-CM | POA: Diagnosis not present

## 2018-07-18 DIAGNOSIS — L718 Other rosacea: Secondary | ICD-10-CM | POA: Diagnosis not present

## 2018-07-18 DIAGNOSIS — Z961 Presence of intraocular lens: Secondary | ICD-10-CM | POA: Diagnosis not present

## 2018-07-18 DIAGNOSIS — Z880 Allergy status to penicillin: Secondary | ICD-10-CM | POA: Diagnosis not present

## 2018-07-18 DIAGNOSIS — Z48810 Encounter for surgical aftercare following surgery on the sense organs: Secondary | ICD-10-CM | POA: Diagnosis not present

## 2018-07-18 DIAGNOSIS — H2511 Age-related nuclear cataract, right eye: Secondary | ICD-10-CM | POA: Diagnosis not present

## 2018-07-18 DIAGNOSIS — H02886 Meibomian gland dysfunction of left eye, unspecified eyelid: Secondary | ICD-10-CM | POA: Diagnosis not present

## 2018-07-25 ENCOUNTER — Other Ambulatory Visit: Payer: Self-pay | Admitting: Internal Medicine

## 2018-07-25 DIAGNOSIS — M170 Bilateral primary osteoarthritis of knee: Secondary | ICD-10-CM

## 2018-07-25 DIAGNOSIS — M48062 Spinal stenosis, lumbar region with neurogenic claudication: Secondary | ICD-10-CM

## 2018-07-25 DIAGNOSIS — M5412 Radiculopathy, cervical region: Secondary | ICD-10-CM

## 2018-07-28 ENCOUNTER — Ambulatory Visit (INDEPENDENT_AMBULATORY_CARE_PROVIDER_SITE_OTHER)
Admission: RE | Admit: 2018-07-28 | Discharge: 2018-07-28 | Disposition: A | Discharge status: Home / Self Care | Payer: Medicare Other | Source: Ambulatory Visit | Attending: Family | Admitting: Family

## 2018-07-28 ENCOUNTER — Encounter: Payer: Self-pay | Admitting: Family

## 2018-07-28 ENCOUNTER — Ambulatory Visit: Payer: Medicare Other | Admitting: Family

## 2018-07-28 VITALS — BP 146/84 | HR 89 | Temp 98.2°F | Ht 64.0 in | Wt 202.0 lb

## 2018-07-28 DIAGNOSIS — M25531 Pain in right wrist: Secondary | ICD-10-CM | POA: Diagnosis not present

## 2018-07-28 DIAGNOSIS — M79641 Pain in right hand: Secondary | ICD-10-CM

## 2018-07-28 DIAGNOSIS — S6991XA Unspecified injury of right wrist, hand and finger(s), initial encounter: Secondary | ICD-10-CM | POA: Diagnosis not present

## 2018-07-28 DIAGNOSIS — M7989 Other specified soft tissue disorders: Secondary | ICD-10-CM | POA: Diagnosis not present

## 2018-07-28 NOTE — Progress Notes (Signed)
Leslie Edwards is a 72 y.o. female with the following history as recorded in [employer]:  Patient Active Problem List   Diagnosis Date Noted  . Primary osteoarthritis of both knees 08/28/2016  . Radiculitis of right cervical region 07/30/2016  . Pure hyperglyceridemia 11/01/2015  . Nonspecific abnormal electrocardiogram (ECG) (EKG) 10/31/2015  . Long term current use of inhaled steroid 06/30/2015  . Heart murmur, systolic 02/72/5366  . Depression with anxiety 07/22/2014  . Asthma, intermittent 06/03/2014  . Visit for screening mammogram 06/03/2014  . Spinal stenosis, lumbar region, with neurogenic claudication 06/03/2014  . Kidney disease, chronic, stage IV (GFR 15-29 ml/min) ([place]) 06/03/2014  . Parathyroid hormone excess ([place]) 08/31/2013  . Celiac disease 08/18/2007  . Essential hypertension 05/06/2007  . Allergic rhinitis 05/06/2007  . OSTEOPOROSIS 05/06/2007  . Other specified hypothyroidism 04/28/2007  . Morbid obesity ([place]) 04/28/2007  . GERD 04/28/2007    Current Outpatient Medications  Medication Sig Dispense Refill  . albuterol (PROVENTIL HFA;VENTOLIN HFA) 108 (90 Base) MCG/ACT inhaler Inhale 2 puffs into the lungs every 6 (six) hours as needed. 18 g 11  . budesonide-formoterol (SYMBICORT) 160-4.5 MCG/ACT inhaler Inhale 2 puffs into the lungs 2 (two) times daily. 1 Inhaler 11  . buPROPion (WELLBUTRIN XL) 150 MG 24 hr tablet TAKE 2 TABLETS IN THE MORNING AND 1 TABLET IN THE EVENING. 270 tablet 1  . fexofenadine-pseudoephedrine (ALLEGRA-D) 60-120 MG per tablet Take 1 tablet by mouth daily as needed.      . furosemide (LASIX) 40 MG tablet TAKE 1 TABLET BY MOUTH TWICE DAILY. 180 tablet 1  . HYDROcodone-acetaminophen (NORCO/VICODIN) 5-325 MG tablet Take 1 tablet by mouth every 6 (six) hours as needed for moderate pain. 90 tablet 0  . hyoscyamine (LEVSIN SL) 0.125 MG SL tablet Place 1 tablet (0.125 mg total) under the tongue every 4 (four) hours as needed. 30 tablet 11  .  ipratropium-albuterol (DUONEB) 0.5-2.5 (3) MG/3ML SOLN Take 3 mLs by nebulization as needed. 360 mL 1  . levothyroxine (SYNTHROID, LEVOTHROID) 50 MCG tablet TAKE 1 TABLET ONCE DAILY. 90 tablet 1  . liothyronine (CYTOMEL) 25 MCG tablet TAKE 1 TABLET ONCE DAILY. 90 tablet 1  . liothyronine (CYTOMEL) 5 MCG tablet TAKE 1 TABLET ONCE DAILY. 90 tablet 1  . meloxicam (MOBIC) 7.5 MG tablet TAKE 1 TABLET ONCE DAILY. 90 tablet 0  . mometasone (NASONEX) 50 MCG/ACT nasal spray USE 2 SPRAYS IN EACH NOSTRIL ONCE DAILY 17 g 11  . naproxen sodium (ALEVE) 220 MG tablet Take by mouth.    . omega-3 acid ethyl esters (LOVAZA) 1 g capsule TAKE (2) CAPSULES BY MOUTH TWICE DAILY. 360 capsule 0  . Omeprazole-Sodium Bicarbonate (ZEGERID) 20-1100 MG CAPS Take 1 capsule by mouth at bedtime as needed.     . potassium chloride (K-DUR) 10 MEQ tablet TAKE 1 TABLET BY MOUTH DAILY. 90 tablet 0  . potassium chloride (K-DUR) 10 MEQ tablet TAKE 1 TABLET BY MOUTH DAILY. 90 tablet 1  . tretinoin (RETIN-A) 0.025 % cream Apply topically at bedtime. Reported on 12/21/2015 45 g 5  . VIIBRYD 40 MG TABS TAKE 1 TABLET ONCE DAILY. 90 tablet 1   No current facility-administered medications for this visit.     Allergies: Penicillins and Prochlorperazine edisylate  Past Medical History:  Diagnosis Date  . Allergy   . ANEMIA, B12 DEFICIENCY 01/28/2009   Qualifier: Diagnosis of  By: Arnoldo Morale MD, Balinda Quails   . Anxiety   . Asthma   . GERD (gastroesophageal reflux  disease)   . Hypertension   . Hypothyroidism   . OSTEOPOROSIS 05/06/2007   Qualifier: Diagnosis of  By: Arnoldo Morale MD, Balinda Quails   . Parathyroid hormone excess ([place]) 08/31/2013   Secondary to low dietary calcium/calcium malabsorption   . PLANTAR FASCIITIS, LEFT 07/05/2009   Qualifier: Diagnosis of  By: Arnoldo Morale MD, [place] 02/25/2009   Qualifier: Diagnosis of  By: Arnoldo Morale MD, Abelino Derrick   . UNSPECIFIED IRON DEFICIENCY ANEMIA 01/28/2009   Qualifier: Diagnosis of  By:  Arnoldo Morale MD, Balinda Quails VENOUS INSUFFICIENCY, CHRONIC 04/28/2007   Qualifier: Diagnosis of  By: Scherrie Gerlach      Past Surgical History:  Procedure Laterality Date  . CARPAL TUNNEL RELEASE    . KNEE ARTHROSCOPY     right  . LIPOMA EXCISION  1965   axilary  . nissan fundiplication    . TONSILLECTOMY      Family History  Problem Relation Age of Onset  . Cancer Mother        lung  . Macular degeneration Father   . Arthritis Father   . Lymphoma Son        died age 49  . Dementia Maternal Grandmother   . Stroke Maternal Grandmother   . Healthy Brother   . Cancer Maternal Grandfather        esophageal cancer  . Cancer Paternal Grandmother        leukemia  . Healthy Brother   . Cancer Maternal Uncle        colon  . Cancer Maternal Aunt        esophageal cancer    Social History   Tobacco Use  . Smoking status: Former Smoker    Last attempt to quit: 01/22/1975    Years since quitting: 43.5  . Smokeless tobacco: Never Used  Substance Use Topics  . Alcohol use: Yes    Alcohol/week: 3.0 standard drinks    Types: 3 Glasses of wine per week    Subjective:  Patient presents with concerns for right hand/ wrist pain x 10 days; did fall prior to onset of symptoms; notes that area is "still throbbing." Has tried icing, wrapping, using NSAIDs with no benefit;      Objective:  Vitals:   07/28/18 1400  BP: (!) 146/84  Pulse: 89  Temp: 98.2 F (36.8 C)  TempSrc: Oral  SpO2: 98%  Weight: 202 lb (91.6 kg)  Height: 5\' 4"  (1.626 m)    General: Well developed, well nourished, in no acute distress  Skin : Warm and dry.  Head: Normocephalic and atraumatic  Lungs: Respirations unlabored;  Musculoskeletal: No deformities; swelling noted base of right thumb extending down into wrist; limited range of motion due to pain Extremities: No edema, cyanosis, clubbing  Vessels: Symmetric bilaterally  Neurologic: Alert and oriented; speech intact; face symmetrical; moves all extremities  well; CNII-XII intact without focal deficit   Assessment:  1. Pain of right hand   2. Right wrist pain     Plan:  Hand and wrist X-ray indicate no fracture; suspect severe strain/ tendonitis; ? If she could benefit from steroid injection- recommend follow-up with sports medicine. She is to get clearance from her ophthalmologist due to upcoming eye surgery later this week.   No follow-ups on file.  Orders Placed This Encounter  Procedures  . DG Wrist Complete Right    Standing Status:   Future    Standing  Expiration Date:   09/28/2019    Order Specific Question:   Reason for Exam (SYMPTOM  OR DIAGNOSIS REQUIRED)    Answer:   right wrist pain    Order Specific Question:   Preferred imaging location?    Answer:   [facility]    Order Specific Question:   Radiology Contrast Protocol - do NOT remove file path    Answer:   \\[email or web address]  . DG Hand Complete Right    Standing Status:   Future    Standing Expiration Date:   09/28/2019    Order Specific Question:   Reason for Exam (SYMPTOM  OR DIAGNOSIS REQUIRED)    Answer:   hand pain    Order Specific Question:   Preferred imaging location?    Answer:   [facility]    Order Specific Question:   Radiology Contrast Protocol - do NOT remove file path    Answer:   \\[email or web address]    Requested Prescriptions    No prescriptions requested or ordered in this encounter

## 2018-07-28 NOTE — Patient Instructions (Signed)
Please schedule with Dr. Raeford Razor tomorrow at [place]- 10:10 am

## 2018-07-29 ENCOUNTER — Ambulatory Visit: Payer: Medicare Other | Admitting: Family Medicine

## 2018-07-29 ENCOUNTER — Encounter: Payer: Self-pay | Admitting: Family Medicine

## 2018-07-29 ENCOUNTER — Ambulatory Visit (INDEPENDENT_AMBULATORY_CARE_PROVIDER_SITE_OTHER): Payer: Medicare Other

## 2018-07-29 ENCOUNTER — Encounter: Payer: Self-pay | Admitting: *Deleted

## 2018-07-29 VITALS — BP 122/80 | HR 82 | Ht 64.0 in | Wt 202.0 lb

## 2018-07-29 DIAGNOSIS — M25531 Pain in right wrist: Secondary | ICD-10-CM

## 2018-07-29 MED ORDER — PREDNISONE 5 MG PO TABS: ORAL_TABLET | ORAL | 0 refills | Status: DC

## 2018-07-29 NOTE — Patient Instructions (Signed)
Nice to meet you  Please try to use ice and tylenol  Please try the brace  Please see me back in 2 weeks  Please call your eye doctor before using the prednisone.

## 2018-07-29 NOTE — Progress Notes (Signed)
Leslie Edwards - 72 y.o. female MRN [id number]  Date of birth: 19-Dec-1945  SUBJECTIVE:  Including CC & ROS.  Chief Complaint  Patient presents with  . Wrist Pain    R wrist, pt fell on 10/31    Leslie Edwards is a 72 y.o. female that is presenting with right wrist and hand pain.  She fell on Halloween.  When she fell she landed backwards with her arm outstretched behind her.  She has had pain over the dorsal carpal bones as well as the ulnar aspect.  Denies any significant swelling.  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  Denies any numbness or tingling.  She is right-handed.  The pain is moderate to severe.  It is localized to the wrist.  She has not tried a brace.  Independent review of the right wrist x-ray from 1111 shows degenerative changes of the carpal bones most notably at the scaphoid with the trapezium and trapezoid.   Review of Systems  Constitutional: Negative for fever.  HENT: Negative for congestion.   Respiratory: Negative for cough.   Cardiovascular: Negative for chest pain.  Gastrointestinal: Negative for abdominal pain.  Musculoskeletal: Positive for arthralgias.  Skin: Negative for color change.  Neurological: Negative for weakness.  Hematological: Negative for adenopathy.  Psychiatric/Behavioral: Negative for agitation.    HISTORY: Past Medical, Surgical, Social, and Family History Reviewed & Updated per EMR.   Pertinent Historical Findings include:  Past Medical History:  Diagnosis Date  . Allergy   . ANEMIA, B12 DEFICIENCY 01/28/2009   Qualifier: Diagnosis of  By: Arnoldo Morale MD, Balinda Quails   . Anxiety   . Asthma   . GERD (gastroesophageal reflux disease)   . Hypertension   . Hypothyroidism   . OSTEOPOROSIS 05/06/2007   Qualifier: Diagnosis of  By: Arnoldo Morale MD, Balinda Quails   . Parathyroid hormone excess ([place]) 08/31/2013   Secondary to low dietary calcium/calcium malabsorption   . PLANTAR FASCIITIS, LEFT 07/05/2009   Qualifier:  Diagnosis of  By: Arnoldo Morale MD, [place] 02/25/2009   Qualifier: Diagnosis of  By: Arnoldo Morale MD, Abelino Derrick   . UNSPECIFIED IRON DEFICIENCY ANEMIA 01/28/2009   Qualifier: Diagnosis of  By: Arnoldo Morale MD, Balinda Quails VENOUS INSUFFICIENCY, CHRONIC 04/28/2007   Qualifier: Diagnosis of  By: Scherrie Gerlach      Past Surgical History:  Procedure Laterality Date  . CARPAL TUNNEL RELEASE    . KNEE ARTHROSCOPY     right  . LIPOMA EXCISION  1965   axilary  . nissan fundiplication    . TONSILLECTOMY      Allergies  Allergen Reactions  . Penicillins     REACTION: difficulty breathing  . Prochlorperazine Edisylate     Family History  Problem Relation Age of Onset  . Cancer Mother        lung  . Macular degeneration Father   . Arthritis Father   . Lymphoma Son        died age 34  . Dementia Maternal Grandmother   . Stroke Maternal Grandmother   . Healthy Brother   . Cancer Maternal Grandfather        esophageal cancer  . Cancer Paternal Grandmother        leukemia  . Healthy Brother   . Cancer Maternal Uncle        colon  . Cancer Maternal Aunt  esophageal cancer     Social History   Socioeconomic History  . Marital status: Married    Spouse name: Not on file  . Number of children: 1  . Years of education: Not on file  . Highest education level: Not on file  Occupational History  . Not on file  Social Needs  . Financial resource strain: Somewhat hard  . Food insecurity:    Worry: Never true    Inability: Never true  . Transportation needs:    Medical: No    Non-medical: No  Tobacco Use  . Smoking status: Former Smoker    Last attempt to quit: 01/22/1975    Years since quitting: 43.5  . Smokeless tobacco: Never Used  Substance and Sexual Activity  . Alcohol use: Yes    Alcohol/week: 3.0 standard drinks    Types: 3 Glasses of wine per week  . Drug use: No  . Sexual activity: Not Currently  Lifestyle  . Physical activity:    Days per week: 2  days    Minutes per session: 30 min  . Stress: Rather much  Relationships  . Social connections:    Talks on phone: More than three times a week    Gets together: More than three times a week    Attends religious service: 1 to 4 times per year    Active member of club or organization: No    Attends meetings of clubs or organizations: Never    Relationship status: Married  . Intimate partner violence:    Fear of current or ex partner: No    Emotionally abused: No    Physically abused: No    Forced sexual activity: No  Other Topics Concern  . Not on file  Social History Narrative   Regular exercise: Not walking   Works 3 days per week at [employer]   Caffeine use: yes     PHYSICAL EXAM:  VS: BP 122/80   Pulse 82   Ht 5\' 4"  (1.626 m)   Wt 202 lb (91.6 kg)   SpO2 97%   BMI 34.67 kg/m  Physical Exam Gen: NAD, alert, cooperative with exam, well-appearing ENT: normal lips, normal nasal mucosa,  Eye: normal EOM, normal conjunctiva and lids CV:  no edema, +2 pedal pulses   Resp: no accessory muscle use, non-labored,   Skin: no rashes, no areas of induration  Neuro: normal tone, normal sensation to touch Psych:  normal insight, alert and oriented MSK:  Right wrist: No ecchymosis. No tenderness to palpation over the distal radius or ulna. Pain reproduced with wrist extension and ulnar deviation. No tenderness to palpation of the snuffbox. Normal thumb resistance to extension. Normal strength resistance with finger abduction and abduction. Pain with ulnar compression. Tenderness to palpation over the ulnar-sided wrist and over the TFCC. Neurovascularly intact  Limited ultrasound: Right wrist:  Mild degenerative changes in the [facility]. Moderate to severe changes between the scaphoid and trapezium. Effusion noted around the ECU.  Has a normal insertion into the base of the fifth metacarpal. Effusion noted within the TFCC  Summary: Findings suggest  degenerative changes within the carpal bones. The ECU with an effusion and this could suggest that the TFCC has a tear  Ultrasound and interpretation by Clearance Coots, MD      ASSESSMENT & PLAN:   Right wrist pain Is having pain diffusely throughout the wrist on the dorsal aspect as well as on the ulnar aspect.  Pain is likely coming from a couple of problems.  Does have significant arthritis within the carpal bones that is contributing.  Also likely has sustained a TFCC injury. -Initiate prednisone. -Placed in a thumb spica splint -Counseled on supportive care. -If no improvement consider an injection to the TFCC  -Could consider physical therapy

## 2018-07-31 DIAGNOSIS — M25531 Pain in right wrist: Secondary | ICD-10-CM | POA: Insufficient documentation

## 2018-07-31 DIAGNOSIS — N184 Chronic kidney disease, stage 4 (severe): Secondary | ICD-10-CM | POA: Diagnosis not present

## 2018-07-31 DIAGNOSIS — H2512 Age-related nuclear cataract, left eye: Secondary | ICD-10-CM | POA: Diagnosis not present

## 2018-07-31 DIAGNOSIS — H25811 Combined forms of age-related cataract, right eye: Secondary | ICD-10-CM | POA: Diagnosis not present

## 2018-07-31 NOTE — Assessment & Plan Note (Signed)
Is having pain diffusely throughout the wrist on the dorsal aspect as well as on the ulnar aspect.  Pain is likely coming from a couple of problems.  Does have significant arthritis within the carpal bones that is contributing.  Also likely has sustained a TFCC injury. -Initiate prednisone. -Placed in a thumb spica splint -Counseled on supportive care. -If no improvement consider an injection to the TFCC  -Could consider physical therapy

## 2018-08-01 DIAGNOSIS — Z48810 Encounter for surgical aftercare following surgery on the sense organs: Secondary | ICD-10-CM | POA: Diagnosis not present

## 2018-08-01 DIAGNOSIS — Z79899 Other long term (current) drug therapy: Secondary | ICD-10-CM | POA: Diagnosis not present

## 2018-08-01 DIAGNOSIS — L718 Other rosacea: Secondary | ICD-10-CM | POA: Diagnosis not present

## 2018-08-01 DIAGNOSIS — Z961 Presence of intraocular lens: Secondary | ICD-10-CM | POA: Diagnosis not present

## 2018-08-08 ENCOUNTER — Ambulatory Visit: Payer: Self-pay

## 2018-08-08 ENCOUNTER — Encounter: Payer: Self-pay | Admitting: Family Medicine

## 2018-08-08 ENCOUNTER — Ambulatory Visit: Payer: Medicare Other | Admitting: Family Medicine

## 2018-08-08 VITALS — BP 150/66 | HR 91 | Resp 16 | Wt 201.0 lb

## 2018-08-08 DIAGNOSIS — M25531 Pain in right wrist: Secondary | ICD-10-CM

## 2018-08-08 MED ORDER — PREDNISONE 5 MG PO TABS: ORAL_TABLET | ORAL | 0 refills | Status: DC

## 2018-08-08 NOTE — Progress Notes (Signed)
Leslie Edwards - 72 y.o. female MRN [id number]  Date of birth: [birth year]  SUBJECTIVE:  Including CC & ROS.  No chief complaint on file.   Leslie Edwards is a 72 y.o. female that is presenting with ongoing right wrist pain.  She initially experienced the wrist pain when she fell backwards.  Initially seen on 11/12.  She had improvement with the prednisone but once the medicine was stopped, the pain returned.  She is having pain over the dorsal carpal bones.  She also experiences significant pain with radial or ulnar deviation.  Denies any weakness.  Pain is constant and ranges from moderate to severe.  Has been using the splint during the day.  Has not been wearing it at night.  This helps with her pain as well.  Denies any radiation.  Independent review of the right wrist x-ray from 11/11 shows degenerative changes of the carpal bones but no fracture.   Review of Systems  Constitutional: Negative for fever.  HENT: Negative for congestion.   Respiratory: Negative for cough.   Cardiovascular: Negative for chest pain.  Gastrointestinal: Negative for abdominal pain.  Musculoskeletal: Positive for arthralgias.  Skin: Negative for color change.  Neurological: Negative for weakness.  Hematological: Negative for adenopathy.  Psychiatric/Behavioral: Negative for agitation.    HISTORY: Past Medical, Surgical, Social, and Family History Reviewed & Updated per EMR.   Pertinent Historical Findings include:  Past Medical History:  Diagnosis Date  . Allergy   . ANEMIA, B12 DEFICIENCY 01/28/2009   Qualifier: Diagnosis of  By: Arnoldo Morale MD, Balinda Quails   . Anxiety   . Asthma   . GERD (gastroesophageal reflux disease)   . Hypertension   . Hypothyroidism   . OSTEOPOROSIS 05/06/2007   Qualifier: Diagnosis of  By: Arnoldo Morale MD, Balinda Quails   . Parathyroid hormone excess ([place]) 08/31/2013   Secondary to low dietary calcium/calcium malabsorption   . PLANTAR FASCIITIS, LEFT 07/05/2009   Qualifier: Diagnosis of   By: Arnoldo Morale MD, [place] 02/25/2009   Qualifier: Diagnosis of  By: Arnoldo Morale MD, Abelino Derrick   . UNSPECIFIED IRON DEFICIENCY ANEMIA 01/28/2009   Qualifier: Diagnosis of  By: Arnoldo Morale MD, Balinda Quails VENOUS INSUFFICIENCY, CHRONIC 04/28/2007   Qualifier: Diagnosis of  By: Scherrie Gerlach      Past Surgical History:  Procedure Laterality Date  . CARPAL TUNNEL RELEASE    . KNEE ARTHROSCOPY     right  . LIPOMA EXCISION  1965   axilary  . nissan fundiplication    . TONSILLECTOMY      Allergies  Allergen Reactions  . Penicillins     REACTION: difficulty breathing  . Prochlorperazine Edisylate     Family History  Problem Relation Age of Onset  . Cancer Mother        lung  . Macular degeneration Father   . Arthritis Father   . Lymphoma Son        died age 67  . Dementia Maternal Grandmother   . Stroke Maternal Grandmother   . Healthy Brother   . Cancer Maternal Grandfather        esophageal cancer  . Cancer Paternal Grandmother        leukemia  . Healthy Brother   . Cancer Maternal Uncle        colon  . Cancer Maternal Aunt        esophageal cancer     Social  History   Socioeconomic History  . Marital status: Married    Spouse name: Not on file  . Number of children: 1  . Years of education: Not on file  . Highest education level: Not on file  Occupational History  . Not on file  Social Needs  . Financial resource strain: Somewhat hard  . Food insecurity:    Worry: Never true    Inability: Never true  . Transportation needs:    Medical: No    Non-medical: No  Tobacco Use  . Smoking status: Former Smoker    Last attempt to quit: 01/22/1975    Years since quitting: 43.5  . Smokeless tobacco: Never Used  Substance and Sexual Activity  . Alcohol use: Yes    Alcohol/week: 3.0 standard drinks    Types: 3 Glasses of wine per week  . Drug use: No  . Sexual activity: Not Currently  Lifestyle  . Physical activity:    Days per week: 2 days     Minutes per session: 30 min  . Stress: Rather much  Relationships  . Social connections:    Talks on phone: More than three times a week    Gets together: More than three times a week    Attends religious service: 1 to 4 times per year    Active member of club or organization: No    Attends meetings of clubs or organizations: Never    Relationship status: Married  . Intimate partner violence:    Fear of current or ex partner: No    Emotionally abused: No    Physically abused: No    Forced sexual activity: No  Other Topics Concern  . Not on file  Social History Narrative   Regular exercise: Not walking   Works 3 days per week at [employer]   Caffeine use: yes     PHYSICAL EXAM:  VS: BP (!) 150/66   Pulse 91   Resp 16   Wt 201 lb (91.2 kg)   SpO2 98%   BMI 34.50 kg/m  Physical Exam Gen: NAD, alert, cooperative with exam, well-appearing ENT: normal lips, normal nasal mucosa,  Eye: normal EOM, normal conjunctiva and lids CV:  no edema, +2 pedal pulses   Resp: no accessory muscle use, non-labored,  Skin: no rashes, no areas of induration  Neuro: normal tone, normal sensation to touch Psych:  normal insight, alert and oriented MSK:  Right Hand and Wrist:  No signs of atrophy of the thenar or hypothenar eminence Normal wrist range of motion in flexion, extension,  Pain with ulnar and radial deviation No finger misalignment or malrotation Normal thumb opposition. Normal finger abduction and abduction. Normal grip strength. No scapholunate instability Negative Finkelstein's test. Neurovascularly intact  Limited ultrasound: right wrist:  Normal appearing first dorsal compartment. No fracture appreciated in the scaphoid. Degenerative changes appreciated within the carpal joints. Ongoing effusion of the ECU versus tear of the TFCC  Summary: Degenerative changes within the carpal joints appreciated.  Effusion over the lateral 6 dorsal  compartment versus TFCC tear.  Ultrasound and interpretation by Clearance Coots, MD       ASSESSMENT & PLAN:   Right wrist pain Pain is ongoing and had significant improvement with the prednisone.  Pain is still generalized with no specific area of pain.  I still feel the pain is a exacerbation of her underlying arthritis of the carpal joints as well as potential TFCC tear. -Try prednisone -  Continue splint and try wearing it at night.  Counseled on range of motion exercises since being in the splint more. -If no improvement consider repeat x-ray versus injection versus physical therapy -Follow-up in 2 weeks

## 2018-08-08 NOTE — Patient Instructions (Signed)
Good to see you  Take tylenol 650 mg three times a day is the best evidence based medicine we have for arthritis.  Glucosamine sulfate 750mg  twice a day is a supplement that has been shown to help moderate to severe arthritis. Vitamin D 2000 IU daily Fish oil 2 grams daily.  Tumeric 500mg  twice daily.  Capsaicin topically up to four times a day may also help with pain. Please use ice  Please use the brace  Please see me back in 2 weeks.

## 2018-08-11 NOTE — Assessment & Plan Note (Addendum)
Pain is ongoing and had significant improvement with the prednisone.  Pain is still generalized with no specific area of pain.  I still feel the pain is a exacerbation of her underlying arthritis of the carpal joints as well as potential TFCC tear. -Try prednisone -Continue splint and try wearing it at night.  Counseled on range of motion exercises since being in the splint more. -If no improvement consider repeat x-ray versus injection versus physical therapy -Follow-up in 2 weeks

## 2018-08-22 ENCOUNTER — Ambulatory Visit: Payer: Medicare Other | Admitting: Family Medicine

## 2018-08-22 ENCOUNTER — Encounter: Payer: Self-pay | Admitting: Family Medicine

## 2018-08-22 DIAGNOSIS — M25531 Pain in right wrist: Secondary | ICD-10-CM | POA: Diagnosis not present

## 2018-08-22 NOTE — Progress Notes (Signed)
Leslie Edwards - 72 y.o. female MRN [id number]  Date of birth: December 07, 1945  SUBJECTIVE:  Including CC & ROS.  No chief complaint on file.   Leslie Edwards is a 72 y.o. female that is following up for right wrist pain.  Reports her symptoms have significantly improved with a second round of prednisone.  Has not had to use the thumb spica splint very often.  She is very pleased with how she feels today.  She has been to get back to her regular activities.  Denies any numbness or tingling.   Review of Systems  Constitutional: Negative for fever.  HENT: Negative for congestion.   Respiratory: Negative for cough.   Cardiovascular: Negative for chest pain.  Gastrointestinal: Negative for abdominal distention.  Musculoskeletal: Positive for arthralgias.  Skin: Negative for color change.  Neurological: Negative for weakness.  Hematological: Negative for adenopathy.  Psychiatric/Behavioral: Negative for agitation.    HISTORY: Past Medical, Surgical, Social, and Family History Reviewed & Updated per EMR.   Pertinent Historical Findings include:  Past Medical History:  Diagnosis Date  . Allergy   . ANEMIA, B12 DEFICIENCY 01/28/2009   Qualifier: Diagnosis of  By: Arnoldo Morale MD, Balinda Quails   . Anxiety   . Asthma   . GERD (gastroesophageal reflux disease)   . Hypertension   . Hypothyroidism   . OSTEOPOROSIS 05/06/2007   Qualifier: Diagnosis of  By: Arnoldo Morale MD, Balinda Quails   . Parathyroid hormone excess ([place]) 08/31/2013   Secondary to low dietary calcium/calcium malabsorption   . PLANTAR FASCIITIS, LEFT 07/05/2009   Qualifier: Diagnosis of  By: Arnoldo Morale MD, [place] 02/25/2009   Qualifier: Diagnosis of  By: Arnoldo Morale MD, Abelino Derrick   . UNSPECIFIED IRON DEFICIENCY ANEMIA 01/28/2009   Qualifier: Diagnosis of  By: Arnoldo Morale MD, Balinda Quails VENOUS INSUFFICIENCY, CHRONIC 04/28/2007   Qualifier: Diagnosis of  By: Scherrie Gerlach      Past Surgical History:  Procedure Laterality Date  .  CARPAL TUNNEL RELEASE    . KNEE ARTHROSCOPY     right  . LIPOMA EXCISION  1965   axilary  . nissan fundiplication    . TONSILLECTOMY      Allergies  Allergen Reactions  . Penicillins     REACTION: difficulty breathing  . Prochlorperazine Edisylate     Family History  Problem Relation Age of Onset  . Cancer Mother        lung  . Macular degeneration Father   . Arthritis Father   . Lymphoma Son        died age 67  . Dementia Maternal Grandmother   . Stroke Maternal Grandmother   . Healthy Brother   . Cancer Maternal Grandfather        esophageal cancer  . Cancer Paternal Grandmother        leukemia  . Healthy Brother   . Cancer Maternal Uncle        colon  . Cancer Maternal Aunt        esophageal cancer     Social History   Socioeconomic History  . Marital status: Married    Spouse name: Not on file  . Number of children: 1  . Years of education: Not on file  . Highest education level: Not on file  Occupational History  . Not on file  Social Needs  . Financial resource strain: Somewhat hard  . Food insecurity:  Worry: Never true    Inability: Never true  . Transportation needs:    Medical: No    Non-medical: No  Tobacco Use  . Smoking status: Former Smoker    Last attempt to quit: 01/22/1975    Years since quitting: 43.6  . Smokeless tobacco: Never Used  Substance and Sexual Activity  . Alcohol use: Yes    Alcohol/week: 3.0 standard drinks    Types: 3 Glasses of wine per week  . Drug use: No  . Sexual activity: Not Currently  Lifestyle  . Physical activity:    Days per week: 2 days    Minutes per session: 30 min  . Stress: Rather much  Relationships  . Social connections:    Talks on phone: More than three times a week    Gets together: More than three times a week    Attends religious service: 1 to 4 times per year    Active member of club or organization: No    Attends meetings of clubs or organizations: Never    Relationship status:  Married  . Intimate partner violence:    Fear of current or ex partner: No    Emotionally abused: No    Physically abused: No    Forced sexual activity: No  Other Topics Concern  . Not on file  Social History Narrative   Regular exercise: Not walking   Works 3 days per week at [employer]   Caffeine use: yes     PHYSICAL EXAM:  VS: BP 138/76   Pulse 89   Resp 16   Wt 202 lb (91.6 kg)   SpO2 98%   BMI 34.67 kg/m  Physical Exam Gen: NAD, alert, cooperative with exam, well-appearing ENT: normal lips, normal nasal mucosa,  Eye: normal EOM, normal conjunctiva and lids CV:  no edema, +2 pedal pulses   Resp: no accessory muscle use, non-labored,  Skin: no rashes, no areas of induration  Neuro: normal tone, normal sensation to touch Psych:  normal insight, alert and oriented MSK:  Right hand/wrist: Normal range of motion. No specific area of tenderness. Normal grip strength. No signs of atrophy. Neurovascular intact     ASSESSMENT & PLAN:   Right wrist pain Symptoms seem to have resolved with a second round of prednisone.  Likely associated with her underlying arthritis is that her pain. -Can get back to her regular activities. -Can use a splint if needed. -Follow-up as needed.

## 2018-08-22 NOTE — Patient Instructions (Signed)
Good to see you  Glad to hear you're feeling better  You may want to ice your wrist every so often  Please try more of the exercises  You can see me back as needed if your pain flares up Happy Holidays!

## 2018-08-25 NOTE — Assessment & Plan Note (Signed)
Symptoms seem to have resolved with a second round of prednisone.  Likely associated with her underlying arthritis is that her pain. -Can get back to her regular activities. -Can use a splint if needed. -Follow-up as needed.

## 2018-10-17 ENCOUNTER — Other Ambulatory Visit: Payer: Self-pay | Admitting: Internal Medicine

## 2018-10-17 DIAGNOSIS — E781 Pure hyperglyceridemia: Secondary | ICD-10-CM

## 2018-10-28 ENCOUNTER — Other Ambulatory Visit: Payer: Self-pay | Admitting: Internal Medicine

## 2018-10-28 DIAGNOSIS — M5412 Radiculopathy, cervical region: Secondary | ICD-10-CM

## 2018-10-28 DIAGNOSIS — M48062 Spinal stenosis, lumbar region with neurogenic claudication: Secondary | ICD-10-CM

## 2018-10-28 DIAGNOSIS — M170 Bilateral primary osteoarthritis of knee: Secondary | ICD-10-CM

## 2018-10-28 DIAGNOSIS — F418 Other specified anxiety disorders: Secondary | ICD-10-CM

## 2018-10-30 ENCOUNTER — Encounter: Payer: Self-pay | Admitting: Internal Medicine

## 2018-10-30 ENCOUNTER — Other Ambulatory Visit (INDEPENDENT_AMBULATORY_CARE_PROVIDER_SITE_OTHER): Payer: Medicare Other

## 2018-10-30 ENCOUNTER — Ambulatory Visit (INDEPENDENT_AMBULATORY_CARE_PROVIDER_SITE_OTHER): Payer: Medicare Other | Admitting: Internal Medicine

## 2018-10-30 VITALS — BP 160/86 | HR 77 | Temp 97.6°F | Resp 16 | Ht 64.0 in | Wt 208.0 lb

## 2018-10-30 DIAGNOSIS — E781 Pure hyperglyceridemia: Secondary | ICD-10-CM

## 2018-10-30 DIAGNOSIS — E038 Other specified hypothyroidism: Secondary | ICD-10-CM

## 2018-10-30 DIAGNOSIS — N184 Chronic kidney disease, stage 4 (severe): Secondary | ICD-10-CM

## 2018-10-30 DIAGNOSIS — R1319 Other dysphagia: Secondary | ICD-10-CM | POA: Insufficient documentation

## 2018-10-30 DIAGNOSIS — F418 Other specified anxiety disorders: Secondary | ICD-10-CM

## 2018-10-30 DIAGNOSIS — R1310 Dysphagia, unspecified: Secondary | ICD-10-CM

## 2018-10-30 DIAGNOSIS — E213 Hyperparathyroidism, unspecified: Secondary | ICD-10-CM

## 2018-10-30 DIAGNOSIS — Z1211 Encounter for screening for malignant neoplasm of colon: Secondary | ICD-10-CM

## 2018-10-30 DIAGNOSIS — I10 Essential (primary) hypertension: Secondary | ICD-10-CM

## 2018-10-30 DIAGNOSIS — Z1231 Encounter for screening mammogram for malignant neoplasm of breast: Secondary | ICD-10-CM

## 2018-10-30 LAB — LIPID PANEL
Cholesterol: 209 mg/dL — ABNORMAL HIGH (ref 0–200)
HDL: 83.2 mg/dL (ref >39.00)
LDL CALC: 90 mg/dL (ref 0–99)
NONHDL: 126.11
Total CHOL/HDL Ratio: 3
Triglycerides: 180 mg/dL — ABNORMAL HIGH (ref 0.0–149.0)
VLDL: 36 mg/dL (ref 0.0–40.0)

## 2018-10-30 LAB — URINALYSIS, ROUTINE W REFLEX MICROSCOPIC
Bilirubin Urine: NEGATIVE
HGB URINE DIPSTICK: NEGATIVE
Ketones, ur: NEGATIVE
Leukocytes,Ua: NEGATIVE
NITRITE: NEGATIVE
PH: 8.5 — AB (ref 5.0–8.0)
Specific Gravity, Urine: 1.01 (ref 1.000–1.030)
TOTAL PROTEIN, URINE-UPE24: NEGATIVE
Urine Glucose: NEGATIVE
Urobilinogen, UA: 0.2 (ref 0.0–1.0)

## 2018-10-30 LAB — CBC WITH DIFFERENTIAL/PLATELET
BASOS PCT: 1.3 % (ref 0.0–3.0)
Basophils Absolute: 0.1 10*3/uL (ref 0.0–0.1)
EOS ABS: 0.3 10*3/uL (ref 0.0–0.7)
Eosinophils Relative: 5.4 % — ABNORMAL HIGH (ref 0.0–5.0)
HCT: 38.7 % (ref 36.0–46.0)
Hemoglobin: 13.2 g/dL (ref 12.0–15.0)
Lymphocytes Relative: 24.5 % (ref 12.0–46.0)
Lymphs Abs: 1.5 10*3/uL (ref 0.7–4.0)
MCHC: 34.1 g/dL (ref 30.0–36.0)
MCV: 101 fl — ABNORMAL HIGH (ref 78.0–100.0)
MONO ABS: 0.7 10*3/uL (ref 0.1–1.0)
Monocytes Relative: 12 % (ref 3.0–12.0)
NEUTROS ABS: 3.4 10*3/uL (ref 1.4–7.7)
Neutrophils Relative %: 56.8 % (ref 43.0–77.0)
PLATELETS: 237 10*3/uL (ref 150.0–400.0)
RBC: 3.83 Mil/uL — ABNORMAL LOW (ref 3.87–5.11)
RDW: 13.4 % (ref 11.5–15.5)
WBC: 5.9 10*3/uL (ref 4.0–10.5)

## 2018-10-30 LAB — COMPREHENSIVE METABOLIC PANEL
ALK PHOS: 59 U/L (ref 39–117)
ALT: 17 U/L (ref 0–35)
AST: 18 U/L (ref 0–37)
Albumin: 4.6 g/dL (ref 3.5–5.2)
BILIRUBIN TOTAL: 0.7 mg/dL (ref 0.2–1.2)
BUN: 37 mg/dL — ABNORMAL HIGH (ref 6–23)
CO2: 28 mEq/L (ref 19–32)
Calcium: 9.5 mg/dL (ref 8.4–10.5)
Chloride: 104 mEq/L (ref 96–112)
Creatinine, Ser: 1.38 mg/dL — ABNORMAL HIGH (ref 0.40–1.20)
GFR: 37.39 mL/min — AB (ref >60.00)
GLUCOSE: 95 mg/dL (ref 70–99)
Potassium: 4.4 mEq/L (ref 3.5–5.1)
Sodium: 141 mEq/L (ref 135–145)
TOTAL PROTEIN: 7 g/dL (ref 6.0–8.3)

## 2018-10-30 LAB — T3, FREE: T3, Free: 2.8 pg/mL (ref 2.3–4.2)

## 2018-10-30 LAB — TSH: TSH: 0.19 u[IU]/mL — AB (ref 0.35–4.50)

## 2018-10-30 MED ORDER — LEVOTHYROXINE SODIUM 25 MCG PO TABS: 25.0000 ug | ORAL_TABLET | Freq: Every day | ORAL | 1 refills | Status: DC

## 2018-10-30 MED ORDER — TORSEMIDE 20 MG PO TABS: 20.0000 mg | ORAL_TABLET | Freq: Every day | ORAL | 1 refills | Status: DC

## 2018-10-30 MED ORDER — VILAZODONE HCL 40 MG PO TABS: 40.0000 mg | ORAL_TABLET | Freq: Every day | ORAL | 1 refills | Status: DC

## 2018-10-30 NOTE — Progress Notes (Signed)
g

## 2018-10-30 NOTE — Patient Instructions (Signed)

## 2018-10-30 NOTE — Progress Notes (Signed)
Subjective:  Patient ID: Leslie Edwards, female    DOB: 1945-09-27  Age: 73 y.o. MRN: [id number]  CC: Hypertension and Hypothyroidism   HPI Leslie Edwards presents for f/up - She complains that her hair is thinning out.  She also complains of chronic fatigue, weight gain, and lower extremity edema.  She denies any recent episodes of CP or DOE.  Outpatient Medications Prior to Visit  Medication Sig Dispense Refill  . albuterol (PROVENTIL HFA;VENTOLIN HFA) 108 (90 Base) MCG/ACT inhaler Inhale 2 puffs into the lungs every 6 (six) hours as needed. 18 g 11  . budesonide-formoterol (SYMBICORT) 160-4.5 MCG/ACT inhaler Inhale 2 puffs into the lungs 2 (two) times daily. 1 Inhaler 11  . buPROPion (WELLBUTRIN XL) 150 MG 24 hr tablet TAKE 2 TABLETS IN THE MORNING AND 1 TABLET IN THE EVENING. 270 tablet 1  . HYDROcodone-acetaminophen (NORCO/VICODIN) 5-325 MG tablet Take 1 tablet by mouth every 6 (six) hours as needed for moderate pain. 90 tablet 0  . hyoscyamine (LEVSIN SL) 0.125 MG SL tablet Place 1 tablet (0.125 mg total) under the tongue every 4 (four) hours as needed. 30 tablet 11  . ipratropium-albuterol (DUONEB) 0.5-2.5 (3) MG/3ML SOLN Take 3 mLs by nebulization as needed. 360 mL 1  . liothyronine (CYTOMEL) 25 MCG tablet TAKE 1 TABLET ONCE DAILY. 90 tablet 1  . liothyronine (CYTOMEL) 5 MCG tablet TAKE 1 TABLET ONCE DAILY. 90 tablet 1  . meloxicam (MOBIC) 7.5 MG tablet TAKE 1 TABLET ONCE DAILY. 90 tablet 0  . mometasone (NASONEX) 50 MCG/ACT nasal spray USE 2 SPRAYS IN EACH NOSTRIL ONCE DAILY 17 g 11  . omega-3 acid ethyl esters (LOVAZA) 1 g capsule TAKE (2) CAPSULES BY MOUTH TWICE DAILY. 360 capsule 1  . potassium chloride (K-DUR) 10 MEQ tablet TAKE 1 TABLET BY MOUTH DAILY. (Patient taking differently: Take 10 mEq by mouth daily. ) 90 tablet 1  . tretinoin (RETIN-A) 0.025 % cream Apply topically at bedtime. Reported on 12/21/2015 45 g 5  . fexofenadine-pseudoephedrine (ALLEGRA-D) 60-120 MG per  tablet Take 1 tablet by mouth daily as needed.      . furosemide (LASIX) 40 MG tablet TAKE 1 TABLET BY MOUTH TWICE DAILY. 180 tablet 1  . levothyroxine (SYNTHROID, LEVOTHROID) 50 MCG tablet TAKE 1 TABLET ONCE DAILY. 90 tablet 1  . Omeprazole-Sodium Bicarbonate (ZEGERID) 20-1100 MG CAPS Take 1 capsule by mouth at bedtime as needed.     Marland Kitchen VIIBRYD 40 MG TABS TAKE 1 TABLET ONCE DAILY. 90 tablet 1  . naproxen sodium (ALEVE) 220 MG tablet Take by mouth.    . predniSONE (DELTASONE) 5 MG tablet Take 6 pills for first day, 5 pills second day, 4 pills third day, 3 pills fourth day, 2 pills the fifth day, and 1 pill sixth day. 21 tablet 0   No facility-administered medications prior to visit.     ROS Review of Systems  Constitutional: Positive for unexpected weight change (wt gain). Negative for appetite change, diaphoresis and fatigue.  HENT: Positive for trouble swallowing. Negative for sore throat and voice change.        She complains that food is getting stuck in her throat  Respiratory: Negative for cough, chest tightness, shortness of breath and wheezing.   Cardiovascular: Positive for leg swelling. Negative for chest pain and palpitations.  Gastrointestinal: Negative for abdominal pain, constipation, diarrhea, nausea and vomiting.  Endocrine: Negative for cold intolerance and heat intolerance.  Genitourinary: Negative.  Negative for difficulty urinating, dysuria  and hematuria.  Musculoskeletal: Positive for arthralgias and back pain. Negative for myalgias and neck pain.  Skin: Negative.  Negative for color change, pallor and rash.  Neurological: Negative.  Negative for dizziness, weakness, light-headedness, numbness and headaches.  Hematological: Negative for adenopathy. Does not bruise/bleed easily.  Psychiatric/Behavioral: Positive for dysphoric mood and sleep disturbance. Negative for self-injury and suicidal ideas. The patient is nervous/anxious.     Objective:  BP (!) 160/86 (BP  Location: Left Arm, Patient Position: Sitting, Cuff Size: Large)   Pulse 77   Temp 97.6 F (36.4 C) (Oral)   Resp 16   Ht 5\' 4"  (1.626 m)   Wt 208 lb (94.3 kg)   SpO2 96%   BMI 35.70 kg/m   BP Readings from Last 3 Encounters:  10/30/18 (!) 160/86  08/22/18 138/76  08/08/18 (!) 150/66    Wt Readings from Last 3 Encounters:  10/30/18 208 lb (94.3 kg)  08/22/18 202 lb (91.6 kg)  08/08/18 201 lb (91.2 kg)    Physical Exam Vitals signs reviewed.  Constitutional:      Appearance: She is obese. She is not ill-appearing or diaphoretic.  HENT:     Nose: Nose normal. No congestion or rhinorrhea.     Mouth/Throat:     Mouth: Mucous membranes are moist.     Pharynx: Oropharynx is clear. No oropharyngeal exudate or posterior oropharyngeal erythema.  Eyes:     General: No scleral icterus.    Conjunctiva/sclera: Conjunctivae normal.  Neck:     Musculoskeletal: Normal range of motion and neck supple. No neck rigidity or muscular tenderness.     Vascular: No carotid bruit.  Cardiovascular:     Rate and Rhythm: Normal rate and regular rhythm.     Heart sounds: Murmur present. Systolic murmur present with a grade of 1/6. No diastolic murmur. No gallop.   Pulmonary:     Effort: Pulmonary effort is normal. No respiratory distress.     Breath sounds: Normal breath sounds. No stridor. No wheezing, rhonchi or rales.  Abdominal:     General: Abdomen is flat. Bowel sounds are normal. There is no distension.     Palpations: There is no mass.     Tenderness: There is no abdominal tenderness. There is no guarding.  Musculoskeletal: Normal range of motion.        General: No swelling.     Right lower leg: Edema (trace pitting) present.     Left lower leg: Edema (trace pitting) present.  Lymphadenopathy:     Cervical: No cervical adenopathy.  Skin:    General: Skin is warm and dry.     Coloration: Skin is not pale.     Findings: No erythema or rash.  Neurological:     General: No focal  deficit present.     Mental Status: She is oriented to person, place, and time. Mental status is at baseline.     Lab Results  Component Value Date   WBC 5.9 10/30/2018   HGB 13.2 10/30/2018   HCT 38.7 10/30/2018   PLT 237.0 10/30/2018   GLUCOSE 95 10/30/2018   CHOL 209 (H) 10/30/2018   TRIG 180.0 (H) 10/30/2018   HDL 83.20 10/30/2018   LDLDIRECT 111.0 10/31/2015   LDLCALC 90 10/30/2018   ALT 17 10/30/2018   AST 18 10/30/2018   NA 141 10/30/2018   K 4.4 10/30/2018   CL 104 10/30/2018   CREATININE 1.38 (H) 10/30/2018   BUN 37 (H) 10/30/2018   CO2  28 10/30/2018   TSH 0.19 (L) 10/30/2018   HGBA1C 5.3 04/28/2009    Dg Wrist Complete Right  Result Date: 07/28/2018 CLINICAL DATA:  [place] 10 days ago with persistent pain and swelling especially laterally. History of carpal tunnel surgery. EXAM: RIGHT WRIST - COMPLETE 3+ VIEW COMPARISON:  Right hand series of today's date FINDINGS: The bones are subjectively adequately mineralized. The radiocarpal and ulnocarpal joint spaces are well maintained. There is some calcification of the triangular fibrocartilage. The articulation of the distal pole of the scaphoid with the trapezium and trapezoid exhibits moderate narrowing in the burn A shin. There is moderate narrowing of the first CMC joint. The other Jackson joints and intercarpal joints appear normal. There is mild soft tissue swelling over the dorsum of the wrist. IMPRESSION: No acute fracture or dislocation. Degenerative changes as described. Electronically Signed   By: David  Martinique M.D.   On: 07/28/2018 14:28   [place] Limited Joint Space Structures Up Right  Result Date: 08/06/2018 Limited ultrasound: Right wrist:  Mild degenerative changes in the [facility]. Moderate to severe changes between the scaphoid and trapezium. Effusion noted around the ECU.  Has a normal insertion into the base of the fifth metacarpal. Effusion noted within the TFCC  Summary: Findings suggest degenerative changes within  the carpal bones. The ECU with an effusion and this could suggest that the TFCC has a tear  Ultrasound and interpretation by Clearance Coots, MD  Dg Hand Complete Right  Result Date: 07/28/2018 CLINICAL DATA:  [place] 10 days ago with persistent generalized pain and swelling laterally. EXAM: RIGHT HAND - COMPLETE 3+ VIEW COMPARISON:  None. FINDINGS: The bones are subjectively adequately mineralized for age. The interphalangeal joint spaces are well maintained. There is mild narrowing of the second and third MCP joints. No acute fracture of the phalanges or metacarpals is observed. There is moderate degenerative change of the first [facility] joint and of the articulation of the distal pole of the scaphoid with the trapezium and trapezoid. The other CMC joints appear normal. The carpal bones are grossly intact. The radiocarpal and ulnocarpal joint spaces are reasonably well-maintained. There is calcification of the triangular fibrocartilage. The soft tissues of the hand are unremarkable. IMPRESSION: No acute bony abnormality of the right hand is observed. There are degenerative changes of the second and third MCP joints, first CMC joint, and the articulation of the distal pole of the scaphoid with the trapezium and trapezoid. Electronically Signed   By: David  Martinique M.D.   On: 07/28/2018 14:27    Assessment & Plan:   Modene was seen today for hypertension and hypothyroidism.  Diagnoses and all orders for this visit:  Essential hypertension- Her blood pressure is not adequately well controlled and she complains of lower extremity edema.  I have asked her to change her loop diuretic to [facility] for better bioaviability and better efficacy. -     CBC with Differential/Platelet; Future -     Comprehensive metabolic panel; Future -     torsemide (DEMADEX) 20 MG tablet; Take 1 tablet (20 mg total) by mouth daily.  Other specified hypothyroidism- Her TSH is suppressed.  I have asked her to lower her dose of  levothyroxine. -     TSH; Future -     T3, free; Future -     levothyroxine (SYNTHROID, LEVOTHROID) 25 MCG tablet; Take 1 tablet (25 mcg total) by mouth daily before breakfast.  Parathyroid hormone excess ([place])- Her calcium is normal but her PTH level  remains mildly elevated.  She is asymptomatic with respect to this so for now we will monitor this. -     Comprehensive metabolic panel; Future -     PTH, intact and calcium; Future  Kidney disease, chronic, stage IV (GFR 15-29 ml/min) (HCC) - Her renal function is stable.  I will try to gain better blood pressure control. -     CBC with Differential/Platelet; Future -     Comprehensive metabolic panel; Future -     Urinalysis, Routine w reflex microscopic; Future  Visit for screening mammogram -     MM DIGITAL SCREENING BILATERAL; Future  Pure hyperglyceridemia -     Lipid panel; Future  Esophageal dysphagia -     Ambulatory referral to Gastroenterology  Colon cancer screening -     Ambulatory referral to Gastroenterology  Depression with anxiety -     Vilazodone HCl (VIIBRYD) 40 MG TABS; Take 1 tablet (40 mg total) by mouth daily.   I have discontinued Aleisa H. Cornia's fexofenadine-pseudoephedrine, Omeprazole-Sodium Bicarbonate, levothyroxine, furosemide, naproxen sodium, and predniSONE. I have also changed her VIIBRYD to Vilazodone HCl. Additionally, I am having her start on torsemide and levothyroxine. Lastly, I am having her maintain her hyoscyamine, budesonide-formoterol, albuterol, ipratropium-albuterol, mometasone, tretinoin, HYDROcodone-acetaminophen, liothyronine, potassium chloride, liothyronine, buPROPion, omega-3 acid ethyl esters, and meloxicam.  Meds ordered this encounter  Medications  . torsemide (DEMADEX) 20 MG tablet    Sig: Take 1 tablet (20 mg total) by mouth daily.    Dispense:  90 tablet    Refill:  1  . Vilazodone HCl (VIIBRYD) 40 MG TABS    Sig: Take 1 tablet (40 mg total) by mouth daily.    Dispense:   90 tablet    Refill:  1  . levothyroxine (SYNTHROID, LEVOTHROID) 25 MCG tablet    Sig: Take 1 tablet (25 mcg total) by mouth daily before breakfast.    Dispense:  90 tablet    Refill:  1     Follow-up: Return in about 3 months (around 01/28/2019).  Scarlette Calico, MD

## 2018-10-31 ENCOUNTER — Encounter: Payer: Self-pay | Admitting: Internal Medicine

## 2018-10-31 LAB — PTH, INTACT AND CALCIUM
Calcium: 9.5 mg/dL (ref 8.6–10.4)
PTH: 83 pg/mL — AB (ref 14–64)

## 2018-11-14 DIAGNOSIS — Z9841 Cataract extraction status, right eye: Secondary | ICD-10-CM | POA: Diagnosis not present

## 2018-11-14 DIAGNOSIS — H26491 Other secondary cataract, right eye: Secondary | ICD-10-CM | POA: Diagnosis not present

## 2018-11-14 DIAGNOSIS — Z961 Presence of intraocular lens: Secondary | ICD-10-CM | POA: Diagnosis not present

## 2018-11-20 ENCOUNTER — Encounter: Payer: Self-pay | Admitting: Internal Medicine

## 2018-11-20 ENCOUNTER — Other Ambulatory Visit: Payer: Self-pay | Admitting: Internal Medicine

## 2018-11-20 DIAGNOSIS — I10 Essential (primary) hypertension: Secondary | ICD-10-CM

## 2018-11-20 DIAGNOSIS — I110 Hypertensive heart disease with heart failure: Secondary | ICD-10-CM

## 2018-11-20 DIAGNOSIS — I5032 Chronic diastolic (congestive) heart failure: Secondary | ICD-10-CM | POA: Insufficient documentation

## 2018-11-20 MED ORDER — METOLAZONE 2.5 MG PO TABS: 2.5000 mg | ORAL_TABLET | Freq: Every day | ORAL | 0 refills | Status: DC

## 2018-11-26 ENCOUNTER — Telehealth: Payer: Self-pay | Admitting: Internal Medicine

## 2018-11-26 NOTE — Telephone Encounter (Signed)
Key: [id number]

## 2018-11-26 NOTE — Telephone Encounter (Signed)
Duplicate encounter. Pt has already been informed via [employer].

## 2018-11-26 NOTE — Telephone Encounter (Signed)
Copied from [place] [phone]. Topic: Quick Communication - See Telephone Encounter >> Nov 26, 2018 11:23 AM Ahmed Prima L wrote: CRM for notification. See Telephone encounter for: 11/26/18.  [employer] called and said the medication metolazone (ZAROXOLYN) 2.5 MG tablet was approved on 11/26/2018 and good for one year.

## 2018-11-27 ENCOUNTER — Ambulatory Visit (HOSPITAL_COMMUNITY): Payer: Medicare Other | Attending: Cardiology

## 2018-11-27 ENCOUNTER — Other Ambulatory Visit: Payer: Self-pay

## 2018-11-27 DIAGNOSIS — I110 Hypertensive heart disease with heart failure: Secondary | ICD-10-CM | POA: Diagnosis not present

## 2018-11-27 DIAGNOSIS — I5032 Chronic diastolic (congestive) heart failure: Secondary | ICD-10-CM | POA: Insufficient documentation

## 2018-11-28 ENCOUNTER — Encounter: Payer: Self-pay | Admitting: Internal Medicine

## 2018-12-03 ENCOUNTER — Telehealth: Payer: Self-pay

## 2018-12-03 NOTE — Telephone Encounter (Signed)
Key: [id number]   PA was approved.   Informed pt via mychart.

## 2018-12-04 ENCOUNTER — Encounter: Payer: Self-pay | Admitting: Internal Medicine

## 2018-12-04 NOTE — Telephone Encounter (Signed)
Approved per [facility]

## 2018-12-26 ENCOUNTER — Other Ambulatory Visit: Payer: Self-pay | Admitting: Internal Medicine

## 2018-12-26 DIAGNOSIS — I110 Hypertensive heart disease with heart failure: Secondary | ICD-10-CM

## 2018-12-26 DIAGNOSIS — I5032 Chronic diastolic (congestive) heart failure: Secondary | ICD-10-CM

## 2018-12-26 DIAGNOSIS — I10 Essential (primary) hypertension: Secondary | ICD-10-CM

## 2019-01-16 ENCOUNTER — Other Ambulatory Visit: Payer: Self-pay | Admitting: Internal Medicine

## 2019-01-16 DIAGNOSIS — E038 Other specified hypothyroidism: Secondary | ICD-10-CM

## 2019-01-21 ENCOUNTER — Other Ambulatory Visit: Payer: Self-pay | Admitting: Internal Medicine

## 2019-01-21 DIAGNOSIS — I110 Hypertensive heart disease with heart failure: Secondary | ICD-10-CM

## 2019-01-21 DIAGNOSIS — I10 Essential (primary) hypertension: Secondary | ICD-10-CM

## 2019-01-21 DIAGNOSIS — I5032 Chronic diastolic (congestive) heart failure: Secondary | ICD-10-CM

## 2019-01-29 ENCOUNTER — Ambulatory Visit: Payer: Medicare Other | Admitting: Internal Medicine

## 2019-02-12 ENCOUNTER — Ambulatory Visit: Payer: Medicare Other | Admitting: Internal Medicine

## 2019-02-18 DIAGNOSIS — H26492 Other secondary cataract, left eye: Secondary | ICD-10-CM | POA: Diagnosis not present

## 2019-02-25 ENCOUNTER — Other Ambulatory Visit: Payer: Self-pay | Admitting: Internal Medicine

## 2019-02-25 DIAGNOSIS — I10 Essential (primary) hypertension: Secondary | ICD-10-CM

## 2019-02-25 DIAGNOSIS — I5032 Chronic diastolic (congestive) heart failure: Secondary | ICD-10-CM

## 2019-02-25 DIAGNOSIS — I110 Hypertensive heart disease with heart failure: Secondary | ICD-10-CM

## 2019-03-05 ENCOUNTER — Ambulatory Visit: Payer: Medicare Other | Admitting: Internal Medicine

## 2019-03-16 ENCOUNTER — Other Ambulatory Visit (INDEPENDENT_AMBULATORY_CARE_PROVIDER_SITE_OTHER): Payer: Medicare Other

## 2019-03-16 ENCOUNTER — Ambulatory Visit (INDEPENDENT_AMBULATORY_CARE_PROVIDER_SITE_OTHER): Payer: Medicare Other | Admitting: Internal Medicine

## 2019-03-16 ENCOUNTER — Other Ambulatory Visit: Payer: Self-pay

## 2019-03-16 ENCOUNTER — Encounter: Payer: Self-pay | Admitting: Internal Medicine

## 2019-03-16 VITALS — BP 124/76 | HR 100 | Temp 99.4°F | Resp 16 | Ht 64.0 in | Wt 189.8 lb

## 2019-03-16 DIAGNOSIS — N184 Chronic kidney disease, stage 4 (severe): Secondary | ICD-10-CM

## 2019-03-16 DIAGNOSIS — E038 Other specified hypothyroidism: Secondary | ICD-10-CM

## 2019-03-16 DIAGNOSIS — I10 Essential (primary) hypertension: Secondary | ICD-10-CM

## 2019-03-16 DIAGNOSIS — M10371 Gout due to renal impairment, right ankle and foot: Secondary | ICD-10-CM

## 2019-03-16 DIAGNOSIS — E213 Hyperparathyroidism, unspecified: Secondary | ICD-10-CM

## 2019-03-16 MED ORDER — OXYCODONE HCL 5 MG PO TABS: 5.0000 mg | ORAL_TABLET | ORAL | 0 refills | Status: DC | PRN

## 2019-03-16 MED ORDER — COLCHICINE 0.6 MG PO CAPS: 1.0000 | ORAL_CAPSULE | Freq: Two times a day (BID) | ORAL | 0 refills | Status: DC

## 2019-03-16 MED ORDER — METHYLPREDNISOLONE ACETATE 80 MG/ML IJ SUSP
80.0000 mg | Freq: Once | INTRAMUSCULAR | Status: AC
  Administered 2019-03-16: 80 mg via INTRAMUSCULAR

## 2019-03-16 NOTE — Progress Notes (Signed)
Subjective:  Patient ID: Leslie Edwards, female    DOB: 10/19/1945  Age: 73 y.o. MRN: [id number]  CC: Hypertension and Hypothyroidism   HPI Leslie Edwards presents for f/up - She complains of a 3-day history of nontraumatic pain, redness, swelling in her right foot around the of base of the first toe.  Outpatient Medications Prior to Visit  Medication Sig Dispense Refill  . albuterol (PROVENTIL HFA;VENTOLIN HFA) 108 (90 Base) MCG/ACT inhaler Inhale 2 puffs into the lungs every 6 (six) hours as needed. 18 g 11  . budesonide-formoterol (SYMBICORT) 160-4.5 MCG/ACT inhaler Inhale 2 puffs into the lungs 2 (two) times daily. 1 Inhaler 11  . buPROPion (WELLBUTRIN XL) 150 MG 24 hr tablet TAKE 2 TABLETS IN THE MORNING AND 1 TABLET IN THE EVENING. 270 tablet 0  . hyoscyamine (LEVSIN SL) 0.125 MG SL tablet Place 1 tablet (0.125 mg total) under the tongue every 4 (four) hours as needed. 30 tablet 11  . ipratropium-albuterol (DUONEB) 0.5-2.5 (3) MG/3ML SOLN Take 3 mLs by nebulization as needed. 360 mL 1  . levothyroxine (SYNTHROID, LEVOTHROID) 25 MCG tablet Take 1 tablet (25 mcg total) by mouth daily before breakfast. 90 tablet 1  . liothyronine (CYTOMEL) 25 MCG tablet TAKE 1 TABLET ONCE DAILY. 90 tablet 0  . liothyronine (CYTOMEL) 5 MCG tablet TAKE 1 TABLET ONCE DAILY. 90 tablet 0  . mometasone (NASONEX) 50 MCG/ACT nasal spray USE 2 SPRAYS IN EACH NOSTRIL ONCE DAILY 17 g 11  . omega-3 acid ethyl esters (LOVAZA) 1 g capsule TAKE (2) CAPSULES BY MOUTH TWICE DAILY. 360 capsule 1  . potassium chloride (K-DUR) 10 MEQ tablet TAKE 1 TABLET BY MOUTH DAILY. 90 tablet 0  . torsemide (DEMADEX) 20 MG tablet Take 1 tablet (20 mg total) by mouth daily. 90 tablet 1  . tretinoin (RETIN-A) 0.025 % cream Apply topically at bedtime. Reported on 12/21/2015 45 g 5  . Vilazodone HCl (VIIBRYD) 40 MG TABS Take 1 tablet (40 mg total) by mouth daily. 90 tablet 1  . HYDROcodone-acetaminophen (NORCO/VICODIN) 5-325 MG tablet Take  1 tablet by mouth every 6 (six) hours as needed for moderate pain. 90 tablet 0  . metolazone (ZAROXOLYN) 2.5 MG tablet TAKE 1 TABLET ONCE DAILY. 30 tablet 0   No facility-administered medications prior to visit.     ROS Review of Systems  Constitutional: Positive for fatigue. Negative for chills, diaphoresis and fever.  HENT: Negative.   Eyes: Negative for visual disturbance.  Respiratory: Negative.  Negative for cough, chest tightness, shortness of breath and wheezing.   Cardiovascular: Negative for chest pain, palpitations and leg swelling.  Gastrointestinal: Negative for abdominal pain, constipation, diarrhea, nausea and vomiting.  Endocrine: Negative.  Negative for cold intolerance and heat intolerance.  Genitourinary: Negative.  Negative for difficulty urinating and dysuria.  Musculoskeletal: Positive for arthralgias and back pain. Negative for myalgias.  Skin: Positive for color change. Negative for pallor and rash.  Neurological: Negative.  Negative for dizziness, weakness, light-headedness and headaches.  Hematological: Negative for adenopathy. Does not bruise/bleed easily.  Psychiatric/Behavioral: Negative.     Objective:  BP 124/76 (BP Location: Left Arm, Patient Position: Sitting, Cuff Size: Normal)   Pulse 100   Temp 99.4 F (37.4 C) (Oral)   Resp 16   Ht 5\' 4"  (1.626 m)   Wt 189 lb 12 oz (86.1 kg)   SpO2 97%   BMI 32.57 kg/m   BP Readings from Last 3 Encounters:  03/16/19 124/76  10/30/18 Marland Kitchen)  160/86  08/22/18 138/76    Wt Readings from Last 3 Encounters:  03/16/19 189 lb 12 oz (86.1 kg)  10/30/18 208 lb (94.3 kg)  08/22/18 202 lb (91.6 kg)    Physical Exam Vitals signs reviewed.  Constitutional:      Appearance: She is obese. She is not ill-appearing or diaphoretic.  HENT:     Nose: Nose normal.     Mouth/Throat:     Mouth: Mucous membranes are moist.     Pharynx: No oropharyngeal exudate.  Eyes:     General: No scleral icterus.     Conjunctiva/sclera: Conjunctivae normal.  Neck:     Musculoskeletal: Normal range of motion. No neck rigidity or muscular tenderness.  Cardiovascular:     Rate and Rhythm: Normal rate and regular rhythm.     Pulses: Normal pulses.     Heart sounds: No murmur.  Pulmonary:     Effort: Pulmonary effort is normal.     Breath sounds: No stridor. No wheezing, rhonchi or rales.  Abdominal:     General: Abdomen is protuberant. Bowel sounds are normal. There is no distension.     Palpations: There is no hepatomegaly or splenomegaly.     Tenderness: There is no abdominal tenderness.  Musculoskeletal:       Feet:  Lymphadenopathy:     Cervical: No cervical adenopathy.     Lab Results  Component Value Date   WBC 5.9 10/30/2018   HGB 13.2 10/30/2018   HCT 38.7 10/30/2018   PLT 237.0 10/30/2018   GLUCOSE 112 (H) 03/16/2019   CHOL 209 (H) 10/30/2018   TRIG 180.0 (H) 10/30/2018   HDL 83.20 10/30/2018   LDLDIRECT 111.0 10/31/2015   LDLCALC 90 10/30/2018   ALT 17 10/30/2018   AST 18 10/30/2018   NA 136 03/16/2019   K 3.4 (L) 03/16/2019   CL 94 (L) 03/16/2019   CREATININE 1.86 (H) 03/16/2019   BUN 35 (H) 03/16/2019   CO2 27 03/16/2019   TSH 0.38 03/16/2019   HGBA1C 5.3 04/28/2009    Dg Wrist Complete Right  Result Date: 07/28/2018 CLINICAL DATA:  [place] 10 days ago with persistent pain and swelling especially laterally. History of carpal tunnel surgery. EXAM: RIGHT WRIST - COMPLETE 3+ VIEW COMPARISON:  Right hand series of today's date FINDINGS: The bones are subjectively adequately mineralized. The radiocarpal and ulnocarpal joint spaces are well maintained. There is some calcification of the triangular fibrocartilage. The articulation of the distal pole of the scaphoid with the trapezium and trapezoid exhibits moderate narrowing in the burn A shin. There is moderate narrowing of the first CMC joint. The other [place] joints and intercarpal joints appear normal. There is mild soft tissue  swelling over the dorsum of the wrist. IMPRESSION: No acute fracture or dislocation. Degenerative changes as described. Electronically Signed   By: David  Martinique M.D.   On: 07/28/2018 14:28   [place] Limited Joint Space Structures Up Right  Result Date: 08/06/2018 Limited ultrasound: Right wrist:  Mild degenerative changes in the [facility]. Moderate to severe changes between the scaphoid and trapezium. Effusion noted around the ECU.  Has a normal insertion into the base of the fifth metacarpal. Effusion noted within the TFCC  Summary: Findings suggest degenerative changes within the carpal bones. The ECU with an effusion and this could suggest that the TFCC has a tear  Ultrasound and interpretation by Clearance Coots, MD  Dg Hand Complete Right  Result Date: 07/28/2018 CLINICAL DATA:  [place] 10 days  ago with persistent generalized pain and swelling laterally. EXAM: RIGHT HAND - COMPLETE 3+ VIEW COMPARISON:  None. FINDINGS: The bones are subjectively adequately mineralized for age. The interphalangeal joint spaces are well maintained. There is mild narrowing of the second and third MCP joints. No acute fracture of the phalanges or metacarpals is observed. There is moderate degenerative change of the first [facility] joint and of the articulation of the distal pole of the scaphoid with the trapezium and trapezoid. The other CMC joints appear normal. The carpal bones are grossly intact. The radiocarpal and ulnocarpal joint spaces are reasonably well-maintained. There is calcification of the triangular fibrocartilage. The soft tissues of the hand are unremarkable. IMPRESSION: No acute bony abnormality of the right hand is observed. There are degenerative changes of the second and third MCP joints, first CMC joint, and the articulation of the distal pole of the scaphoid with the trapezium and trapezoid. Electronically Signed   By: David  Martinique M.D.   On: 07/28/2018 14:27    Assessment & Plan:   Leslie Edwards was seen today for  hypertension and hypothyroidism.  Diagnoses and all orders for this visit:  Other specified hypothyroidism- Her TSH is in the normal range.  She will remain on the current dose of levothyroxine. -     TSH; Future  Parathyroid hormone excess ([place])- Her calcium level is mildly elevated again.  When I next see her I will monitor her PTH level. -     Basic metabolic panel; Future  Kidney disease, chronic, stage IV (GFR 15-29 ml/min) ([place])- She has had a significant decline in her renal function.  I have asked her to avoid nephrotoxic agents.  We will hold her loop diuretic for now.  I have also asked her to see nephrology for ongoing evaluation and treatment options. -     Basic metabolic panel; Future -     Ambulatory referral to Nephrology  Essential hypertension- Her blood pressure is adequately well controlled. -     Basic metabolic panel; Future  Acute gout due to renal impairment involving toe of right foot- She has acute podagra in the right foot.  Her uric acid level is elevated at 10.1.  I will wait for this gout flare to resolve before for starting a xanthine oxidase inhibitor.  For now, will treat her with a course of systemic steroids, colchicine, and oxycodone as needed for the pain. -     Colchicine (MITIGARE) 0.6 MG CAPS; Take 1 tablet by mouth 2 (two) times daily. -     Uric acid; Future -     oxyCODONE (OXY IR/ROXICODONE) 5 MG immediate release tablet; Take 1 tablet (5 mg total) by mouth every 4 (four) hours as needed for severe pain. -     methylPREDNISolone acetate (DEPO-MEDROL) injection 80 mg   I have discontinued Leslie Edwards's HYDROcodone-acetaminophen and metolazone. I am also having her start on Colchicine and oxyCODONE. Additionally, I am having her maintain her hyoscyamine, budesonide-formoterol, albuterol, ipratropium-albuterol, mometasone, tretinoin, omega-3 acid ethyl esters, torsemide, Vilazodone HCl, levothyroxine, liothyronine, potassium chloride,  liothyronine, and buPROPion. We administered methylPREDNISolone acetate.  Meds ordered this encounter  Medications  . Colchicine (MITIGARE) 0.6 MG CAPS    Sig: Take 1 tablet by mouth 2 (two) times daily.    Dispense:  180 capsule    Refill:  0  . oxyCODONE (OXY IR/ROXICODONE) 5 MG immediate release tablet    Sig: Take 1 tablet (5 mg total) by mouth every 4 (four) hours  as needed for severe pain.    Dispense:  35 tablet    Refill:  0  . methylPREDNISolone acetate (DEPO-MEDROL) injection 80 mg     Follow-up: Return in about 4 weeks (around 04/13/2019).  Scarlette Calico, MD

## 2019-03-16 NOTE — Patient Instructions (Signed)
Gout  Gout is a condition that causes painful swelling of the joints. Gout is a type of inflammation of the joints (arthritis). This condition is caused by having too much uric acid in the body. Uric acid is a chemical that forms when the body breaks down substances called purines. Purines are important for building body proteins. When the body has too much uric acid, sharp crystals can form and build up inside the joints. This causes pain and swelling. Gout attacks can happen quickly and may be very painful (acute gout). Over time, the attacks can affect more joints and become more frequent (chronic gout). Gout can also cause uric acid to build up under the skin and inside the kidneys. What are the causes? This condition is caused by too much uric acid in your blood. This can happen because:  Your kidneys do not remove enough uric acid from your blood. This is the most common cause.  Your body makes too much uric acid. This can happen with some cancers and cancer treatments. It can also occur if your body is breaking down too many red blood cells (hemolytic anemia).  You eat too many foods that are high in purines. These foods include organ meats and some seafood. Alcohol, especially beer, is also high in purines. A gout attack may be triggered by trauma or stress. What increases the risk? You are more likely to develop this condition if you:  Have a family history of gout.  Are female and middle-aged.  Are female and have gone through menopause.  Are obese.  Frequently drink alcohol, especially beer.  Are dehydrated.  Lose weight too quickly.  Have an organ transplant.  Have lead poisoning.  Take certain medicines, including aspirin, cyclosporine, diuretics, levodopa, and niacin.  Have kidney disease.  Have a skin condition called psoriasis. What are the signs or symptoms? An attack of acute gout happens quickly. It usually occurs in just one joint. The most common place is  the big toe. Attacks often start at night. Other joints that may be affected include joints of the feet, ankle, knee, fingers, wrist, or elbow. Symptoms of this condition may include:  Severe pain.  Warmth.  Swelling.  Stiffness.  Tenderness. The affected joint may be very painful to touch.  Shiny, red, or purple skin.  Chills and fever. Chronic gout may cause symptoms more frequently. More joints may be involved. You may also have white or yellow lumps (tophi) on your hands or feet or in other areas near your joints. How is this diagnosed? This condition is diagnosed based on your symptoms, medical history, and physical exam. You may have tests, such as:  Blood tests to measure uric acid levels.  Removal of joint fluid with a thin needle (aspiration) to look for uric acid crystals.  X-rays to look for joint damage. How is this treated? Treatment for this condition has two phases: treating an acute attack and preventing future attacks. Acute gout treatment may include medicines to reduce pain and swelling, including:  NSAIDs.  Steroids. These are strong anti-inflammatory medicines that can be taken by mouth (orally) or injected into a joint.  Colchicine. This medicine relieves pain and swelling when it is taken soon after an attack. It can be given by mouth or through an IV. Preventive treatment may include:  Daily use of smaller doses of NSAIDs or colchicine.  Use of a medicine that reduces uric acid levels in your blood.  Changes to your diet. You may   need to see a dietitian about what to eat and drink to prevent gout. Follow these instructions at home: During a gout attack   If directed, put ice on the affected area: ? Put ice in a plastic bag. ? Place a towel between your skin and the bag. ? Leave the ice on for 20 minutes, 2-3 times a day.  Raise (elevate) the affected joint above the level of your heart as often as possible.  Rest the joint as much as possible.  If the affected joint is in your leg, you may be given crutches to use.  Follow instructions from your health care provider about eating or drinking restrictions. Avoiding future gout attacks  Follow a low-purine diet as told by your dietitian or health care provider. Avoid foods and drinks that are high in purines, including liver, kidney, anchovies, asparagus, herring, mushrooms, mussels, and beer.  Maintain a healthy weight or lose weight if you are overweight. If you want to lose weight, talk with your health care provider. It is important that you do not lose weight too quickly.  Start or maintain an exercise program as told by your health care provider. Eating and drinking  Drink enough fluids to keep your urine pale yellow.  If you drink alcohol: ? Limit how much you use to:  0-1 drink a day for women.  0-2 drinks a day for men. ? Be aware of how much alcohol is in your drink. In the [place] one drink equals one 12 oz bottle of beer (355 mL) one 5 oz glass of wine (148 mL), or one 1 oz glass of hard liquor (44 mL). General instructions  Take over-the-counter and prescription medicines only as told by your health care provider.  Do not drive or use heavy machinery while taking prescription pain medicine.  Return to your normal activities as told by your health care provider. Ask your health care provider what activities are safe for you.  Keep all follow-up visits as told by your health care provider. This is important. Contact a health care provider if you have:  Another gout attack.  Continuing symptoms of a gout attack after 10 days of treatment.  Side effects from your medicines.  Chills or a fever.  Burning pain when you urinate.  Pain in your lower back or belly. Get help right away if you:  Have severe or uncontrolled pain.  Cannot urinate. Summary  Gout is painful swelling of the joints caused by inflammation.  The most common site of pain is the big  toe, but it can affect other joints in the body.  Medicines and dietary changes can help to prevent and treat gout attacks. This information is not intended to replace advice given to you by your health care provider. Make sure you discuss any questions you have with your health care provider. Document Released: 08/31/2000 Document Revised: 03/26/2018 Document Reviewed: 03/26/2018 Elsevier Patient Education  2020 [employer]  

## 2019-03-17 ENCOUNTER — Encounter: Payer: Self-pay | Admitting: Internal Medicine

## 2019-03-17 LAB — BASIC METABOLIC PANEL
BUN: 35 mg/dL — ABNORMAL HIGH (ref 6–23)
CO2: 27 mEq/L (ref 19–32)
Calcium: 10.8 mg/dL — ABNORMAL HIGH (ref 8.4–10.5)
Chloride: 94 mEq/L — ABNORMAL LOW (ref 96–112)
Creatinine, Ser: 1.86 mg/dL — ABNORMAL HIGH (ref 0.40–1.20)
GFR: 26.47 mL/min — ABNORMAL LOW (ref >60.00)
Glucose, Bld: 112 mg/dL — ABNORMAL HIGH (ref 70–99)
Potassium: 3.4 mEq/L — ABNORMAL LOW (ref 3.5–5.1)
Sodium: 136 mEq/L (ref 135–145)

## 2019-03-17 LAB — TSH: TSH: 0.38 u[IU]/mL (ref 0.35–4.50)

## 2019-03-17 LAB — URIC ACID: Uric Acid, Serum: 10.1 mg/dL — ABNORMAL HIGH (ref 2.4–7.0)

## 2019-04-13 ENCOUNTER — Encounter: Payer: Self-pay | Admitting: Internal Medicine

## 2019-04-13 ENCOUNTER — Ambulatory Visit (INDEPENDENT_AMBULATORY_CARE_PROVIDER_SITE_OTHER): Payer: Medicare Other | Admitting: Internal Medicine

## 2019-04-13 ENCOUNTER — Other Ambulatory Visit: Payer: Self-pay

## 2019-04-13 VITALS — BP 148/88 | HR 70 | Temp 98.4°F | Ht 64.0 in | Wt 194.0 lb

## 2019-04-13 DIAGNOSIS — M48062 Spinal stenosis, lumbar region with neurogenic claudication: Secondary | ICD-10-CM | POA: Diagnosis not present

## 2019-04-13 DIAGNOSIS — E781 Pure hyperglyceridemia: Secondary | ICD-10-CM

## 2019-04-13 DIAGNOSIS — Z1231 Encounter for screening mammogram for malignant neoplasm of breast: Secondary | ICD-10-CM

## 2019-04-13 DIAGNOSIS — M1A39X0 Chronic gout due to renal impairment, multiple sites, without tophus (tophi): Secondary | ICD-10-CM

## 2019-04-13 DIAGNOSIS — M5412 Radiculopathy, cervical region: Secondary | ICD-10-CM | POA: Diagnosis not present

## 2019-04-13 DIAGNOSIS — Z1211 Encounter for screening for malignant neoplasm of colon: Secondary | ICD-10-CM

## 2019-04-13 DIAGNOSIS — M170 Bilateral primary osteoarthritis of knee: Secondary | ICD-10-CM | POA: Diagnosis not present

## 2019-04-13 DIAGNOSIS — M10371 Gout due to renal impairment, right ankle and foot: Secondary | ICD-10-CM

## 2019-04-13 MED ORDER — ALLOPURINOL 100 MG PO TABS: 100.0000 mg | ORAL_TABLET | Freq: Every day | ORAL | 1 refills | Status: DC

## 2019-04-13 MED ORDER — OXYCODONE HCL 5 MG PO TABS: 5.0000 mg | ORAL_TABLET | Freq: Four times a day (QID) | ORAL | 0 refills | Status: DC | PRN

## 2019-04-13 NOTE — Patient Instructions (Signed)
Gout  Gout is a condition that causes painful swelling of the joints. Gout is a type of inflammation of the joints (arthritis). This condition is caused by having too much uric acid in the body. Uric acid is a chemical that forms when the body breaks down substances called purines. Purines are important for building body proteins. When the body has too much uric acid, sharp crystals can form and build up inside the joints. This causes pain and swelling. Gout attacks can happen quickly and may be very painful (acute gout). Over time, the attacks can affect more joints and become more frequent (chronic gout). Gout can also cause uric acid to build up under the skin and inside the kidneys. What are the causes? This condition is caused by too much uric acid in your blood. This can happen because:  Your kidneys do not remove enough uric acid from your blood. This is the most common cause.  Your body makes too much uric acid. This can happen with some cancers and cancer treatments. It can also occur if your body is breaking down too many red blood cells (hemolytic anemia).  You eat too many foods that are high in purines. These foods include organ meats and some seafood. Alcohol, especially beer, is also high in purines. A gout attack may be triggered by trauma or stress. What increases the risk? You are more likely to develop this condition if you:  Have a family history of gout.  Are female and middle-aged.  Are female and have gone through menopause.  Are obese.  Frequently drink alcohol, especially beer.  Are dehydrated.  Lose weight too quickly.  Have an organ transplant.  Have lead poisoning.  Take certain medicines, including aspirin, cyclosporine, diuretics, levodopa, and niacin.  Have kidney disease.  Have a skin condition called psoriasis. What are the signs or symptoms? An attack of acute gout happens quickly. It usually occurs in just one joint. The most common place is  the big toe. Attacks often start at night. Other joints that may be affected include joints of the feet, ankle, knee, fingers, wrist, or elbow. Symptoms of this condition may include:  Severe pain.  Warmth.  Swelling.  Stiffness.  Tenderness. The affected joint may be very painful to touch.  Shiny, red, or purple skin.  Chills and fever. Chronic gout may cause symptoms more frequently. More joints may be involved. You may also have white or yellow lumps (tophi) on your hands or feet or in other areas near your joints. How is this diagnosed? This condition is diagnosed based on your symptoms, medical history, and physical exam. You may have tests, such as:  Blood tests to measure uric acid levels.  Removal of joint fluid with a thin needle (aspiration) to look for uric acid crystals.  X-rays to look for joint damage. How is this treated? Treatment for this condition has two phases: treating an acute attack and preventing future attacks. Acute gout treatment may include medicines to reduce pain and swelling, including:  NSAIDs.  Steroids. These are strong anti-inflammatory medicines that can be taken by mouth (orally) or injected into a joint.  Colchicine. This medicine relieves pain and swelling when it is taken soon after an attack. It can be given by mouth or through an IV. Preventive treatment may include:  Daily use of smaller doses of NSAIDs or colchicine.  Use of a medicine that reduces uric acid levels in your blood.  Changes to your diet. You may   need to see a dietitian about what to eat and drink to prevent gout. Follow these instructions at home: During a gout attack   If directed, put ice on the affected area: ? Put ice in a plastic bag. ? Place a towel between your skin and the bag. ? Leave the ice on for 20 minutes, 2-3 times a day.  Raise (elevate) the affected joint above the level of your heart as often as possible.  Rest the joint as much as possible.  If the affected joint is in your leg, you may be given crutches to use.  Follow instructions from your health care provider about eating or drinking restrictions. Avoiding future gout attacks  Follow a low-purine diet as told by your dietitian or health care provider. Avoid foods and drinks that are high in purines, including liver, kidney, anchovies, asparagus, herring, mushrooms, mussels, and beer.  Maintain a healthy weight or lose weight if you are overweight. If you want to lose weight, talk with your health care provider. It is important that you do not lose weight too quickly.  Start or maintain an exercise program as told by your health care provider. Eating and drinking  Drink enough fluids to keep your urine pale yellow.  If you drink alcohol: ? Limit how much you use to:  0-1 drink a day for women.  0-2 drinks a day for men. ? Be aware of how much alcohol is in your drink. In the [place] one drink equals one 12 oz bottle of beer (355 mL) one 5 oz glass of wine (148 mL), or one 1 oz glass of hard liquor (44 mL). General instructions  Take over-the-counter and prescription medicines only as told by your health care provider.  Do not drive or use heavy machinery while taking prescription pain medicine.  Return to your normal activities as told by your health care provider. Ask your health care provider what activities are safe for you.  Keep all follow-up visits as told by your health care provider. This is important. Contact a health care provider if you have:  Another gout attack.  Continuing symptoms of a gout attack after 10 days of treatment.  Side effects from your medicines.  Chills or a fever.  Burning pain when you urinate.  Pain in your lower back or belly. Get help right away if you:  Have severe or uncontrolled pain.  Cannot urinate. Summary  Gout is painful swelling of the joints caused by inflammation.  The most common site of pain is the big  toe, but it can affect other joints in the body.  Medicines and dietary changes can help to prevent and treat gout attacks. This information is not intended to replace advice given to you by your health care provider. Make sure you discuss any questions you have with your health care provider. Document Released: 08/31/2000 Document Revised: 03/26/2018 Document Reviewed: 03/26/2018 Elsevier Patient Education  2020 [employer]  

## 2019-04-13 NOTE — Progress Notes (Signed)
Subjective:  Patient ID: Leslie Edwards, female    DOB: May 28, 1946  Age: 73 y.o. MRN: [id number]  CC: Back Pain, Osteoarthritis, and Hypertension   HPI Jessina H Koon presents for f/up - She tells me that the pain and the swelling in the right foot has resolved.  She feels like the gout has been adequately treated.  She continues to take oxycodone for back and joint pain.  She has stopped taking colchicine.  She is aware that her uric acid level is elevated and she is willing to start taking a xanthine oxidase inhibitor.  Outpatient Medications Prior to Visit  Medication Sig Dispense Refill  . albuterol (PROVENTIL HFA;VENTOLIN HFA) 108 (90 Base) MCG/ACT inhaler Inhale 2 puffs into the lungs every 6 (six) hours as needed. 18 g 11  . budesonide-formoterol (SYMBICORT) 160-4.5 MCG/ACT inhaler Inhale 2 puffs into the lungs 2 (two) times daily. 1 Inhaler 11  . buPROPion (WELLBUTRIN XL) 150 MG 24 hr tablet TAKE 2 TABLETS IN THE MORNING AND 1 TABLET IN THE EVENING. 270 tablet 0  . Colchicine (MITIGARE) 0.6 MG CAPS Take 1 tablet by mouth 2 (two) times daily. 180 capsule 0  . hyoscyamine (LEVSIN SL) 0.125 MG SL tablet Place 1 tablet (0.125 mg total) under the tongue every 4 (four) hours as needed. 30 tablet 11  . ipratropium-albuterol (DUONEB) 0.5-2.5 (3) MG/3ML SOLN Take 3 mLs by nebulization as needed. 360 mL 1  . levothyroxine (SYNTHROID, LEVOTHROID) 25 MCG tablet Take 1 tablet (25 mcg total) by mouth daily before breakfast. 90 tablet 1  . liothyronine (CYTOMEL) 25 MCG tablet TAKE 1 TABLET ONCE DAILY. 90 tablet 0  . liothyronine (CYTOMEL) 5 MCG tablet TAKE 1 TABLET ONCE DAILY. 90 tablet 0  . mometasone (NASONEX) 50 MCG/ACT nasal spray USE 2 SPRAYS IN EACH NOSTRIL ONCE DAILY 17 g 11  . omega-3 acid ethyl esters (LOVAZA) 1 g capsule TAKE (2) CAPSULES BY MOUTH TWICE DAILY. 360 capsule 1  . potassium chloride (K-DUR) 10 MEQ tablet TAKE 1 TABLET BY MOUTH DAILY. 90 tablet 0  . torsemide (DEMADEX) 20 MG  tablet Take 1 tablet (20 mg total) by mouth daily. 90 tablet 1  . tretinoin (RETIN-A) 0.025 % cream Apply topically at bedtime. Reported on 12/21/2015 45 g 5  . Vilazodone HCl (VIIBRYD) 40 MG TABS Take 1 tablet (40 mg total) by mouth daily. 90 tablet 1  . oxyCODONE (OXY IR/ROXICODONE) 5 MG immediate release tablet Take 1 tablet (5 mg total) by mouth every 4 (four) hours as needed for severe pain. 35 tablet 0   No facility-administered medications prior to visit.     ROS Review of Systems  Constitutional: Negative for appetite change, diaphoresis, fatigue and unexpected weight change.  HENT: Negative.   Eyes: Negative for visual disturbance.  Respiratory: Negative for cough, chest tightness and shortness of breath.   Cardiovascular: Negative for chest pain and palpitations.  Gastrointestinal: Negative for abdominal pain, constipation, diarrhea, nausea and vomiting.  Genitourinary: Negative.  Negative for difficulty urinating and dysuria.  Musculoskeletal: Positive for arthralgias, back pain and neck pain. Negative for myalgias.  Skin: Negative for color change, pallor and rash.  Neurological: Negative.  Negative for dizziness and weakness.  Hematological: Negative for adenopathy. Does not bruise/bleed easily.  Psychiatric/Behavioral: Negative.     Objective:  BP (!) 148/88 (BP Location: Left Arm, Patient Position: Sitting, Cuff Size: Normal)   Pulse 70   Temp 98.4 F (36.9 C) (Oral)   Ht 5\' 4"  (  1.626 m)   Wt 194 lb (88 kg)   SpO2 97%   BMI 33.30 kg/m   BP Readings from Last 3 Encounters:  04/13/19 (!) 148/88  03/16/19 124/76  10/30/18 (!) 160/86    Wt Readings from Last 3 Encounters:  04/13/19 194 lb (88 kg)  03/16/19 189 lb 12 oz (86.1 kg)  10/30/18 208 lb (94.3 kg)    Physical Exam Vitals signs reviewed.  Constitutional:      Appearance: She is obese. She is not ill-appearing or diaphoretic.  HENT:     Nose: Nose normal.     Mouth/Throat:     Mouth: Mucous  membranes are moist.  Eyes:     General: No scleral icterus.    Conjunctiva/sclera: Conjunctivae normal.  Neck:     Musculoskeletal: Normal range of motion. No neck rigidity.  Cardiovascular:     Rate and Rhythm: Normal rate and regular rhythm.     Heart sounds: No murmur.  Pulmonary:     Effort: Pulmonary effort is normal. No respiratory distress.     Breath sounds: No stridor. No wheezing, rhonchi or rales.  Abdominal:     General: Abdomen is protuberant. Bowel sounds are normal. There is no distension.     Palpations: Abdomen is soft. There is no hepatomegaly or splenomegaly.     Tenderness: There is no abdominal tenderness.  Musculoskeletal: Normal range of motion.     Right lower leg: No edema.     Left lower leg: No edema.  Lymphadenopathy:     Cervical: No cervical adenopathy.  Skin:    General: Skin is warm and dry.  Neurological:     General: No focal deficit present.     Mental Status: She is oriented to person, place, and time. Mental status is at baseline.     Lab Results  Component Value Date   WBC 5.9 10/30/2018   HGB 13.2 10/30/2018   HCT 38.7 10/30/2018   PLT 237.0 10/30/2018   GLUCOSE 112 (H) 03/16/2019   CHOL 209 (H) 10/30/2018   TRIG 180.0 (H) 10/30/2018   HDL 83.20 10/30/2018   LDLDIRECT 111.0 10/31/2015   LDLCALC 90 10/30/2018   ALT 17 10/30/2018   AST 18 10/30/2018   NA 136 03/16/2019   K 3.4 (L) 03/16/2019   CL 94 (L) 03/16/2019   CREATININE 1.86 (H) 03/16/2019   BUN 35 (H) 03/16/2019   CO2 27 03/16/2019   TSH 0.38 03/16/2019   HGBA1C 5.3 04/28/2009    Dg Wrist Complete Right  Result Date: 07/28/2018 CLINICAL DATA:  [place] 10 days ago with persistent pain and swelling especially laterally. History of carpal tunnel surgery. EXAM: RIGHT WRIST - COMPLETE 3+ VIEW COMPARISON:  Right hand series of today's date FINDINGS: The bones are subjectively adequately mineralized. The radiocarpal and ulnocarpal joint spaces are well maintained. There is  some calcification of the triangular fibrocartilage. The articulation of the distal pole of the scaphoid with the trapezium and trapezoid exhibits moderate narrowing in the burn A shin. There is moderate narrowing of the first CMC joint. The other [place] joints and intercarpal joints appear normal. There is mild soft tissue swelling over the dorsum of the wrist. IMPRESSION: No acute fracture or dislocation. Degenerative changes as described. Electronically Signed   By: David  Martinique M.D.   On: 07/28/2018 14:28   [place] Limited Joint Space Structures Up Right  Result Date: 08/06/2018 Limited ultrasound: Right wrist:  Mild degenerative changes in the [facility]. Moderate to  severe changes between the scaphoid and trapezium. Effusion noted around the ECU.  Has a normal insertion into the base of the fifth metacarpal. Effusion noted within the TFCC  Summary: Findings suggest degenerative changes within the carpal bones. The ECU with an effusion and this could suggest that the TFCC has a tear  Ultrasound and interpretation by Clearance Coots, MD  Dg Hand Complete Right  Result Date: 07/28/2018 CLINICAL DATA:  [place] 10 days ago with persistent generalized pain and swelling laterally. EXAM: RIGHT HAND - COMPLETE 3+ VIEW COMPARISON:  None. FINDINGS: The bones are subjectively adequately mineralized for age. The interphalangeal joint spaces are well maintained. There is mild narrowing of the second and third MCP joints. No acute fracture of the phalanges or metacarpals is observed. There is moderate degenerative change of the first [facility] joint and of the articulation of the distal pole of the scaphoid with the trapezium and trapezoid. The other CMC joints appear normal. The carpal bones are grossly intact. The radiocarpal and ulnocarpal joint spaces are reasonably well-maintained. There is calcification of the triangular fibrocartilage. The soft tissues of the hand are unremarkable. IMPRESSION: No acute bony abnormality of the  right hand is observed. There are degenerative changes of the second and third MCP joints, first CMC joint, and the articulation of the distal pole of the scaphoid with the trapezium and trapezoid. Electronically Signed   By: David  Martinique M.D.   On: 07/28/2018 14:27    Assessment & Plan:   Murline was seen today for back pain, osteoarthritis and hypertension.  Diagnoses and all orders for this visit:  Chronic gout due to renal impairment of multiple sites without tophus- I have asked her to start taking a xanthine oxidase inhibitor to reduce the risk of recurrent gout attacks. -     allopurinol (ZYLOPRIM) 100 MG tablet; Take 1 tablet (100 mg total) by mouth daily.  Colon cancer screening -     Cologuard  Radiculitis of right cervical region -     oxyCODONE (OXY IR/ROXICODONE) 5 MG immediate release tablet; Take 1 tablet (5 mg total) by mouth every 6 (six) hours as needed for severe pain.  Primary osteoarthritis of both knees -     oxyCODONE (OXY IR/ROXICODONE) 5 MG immediate release tablet; Take 1 tablet (5 mg total) by mouth every 6 (six) hours as needed for severe pain.  Pure hyperglyceridemia  Visit for screening mammogram -     MM DIGITAL SCREENING BILATERAL; Future  Acute gout due to renal impairment involving toe of right foot  Spinal stenosis, lumbar region, with neurogenic claudication -     oxyCODONE (OXY IR/ROXICODONE) 5 MG immediate release tablet; Take 1 tablet (5 mg total) by mouth every 6 (six) hours as needed for severe pain.   I have changed Kameria H. Mccance "Leo Cancio"'s oxyCODONE. I am also having her start on allopurinol. Additionally, I am having her maintain her hyoscyamine, budesonide-formoterol, albuterol, ipratropium-albuterol, mometasone, tretinoin, omega-3 acid ethyl esters, torsemide, Vilazodone HCl, levothyroxine, liothyronine, potassium chloride, liothyronine, buPROPion, and Colchicine.  Meds ordered this encounter  Medications  . allopurinol  (ZYLOPRIM) 100 MG tablet    Sig: Take 1 tablet (100 mg total) by mouth daily.    Dispense:  90 tablet    Refill:  1  . oxyCODONE (OXY IR/ROXICODONE) 5 MG immediate release tablet    Sig: Take 1 tablet (5 mg total) by mouth every 6 (six) hours as needed for severe pain.    Dispense:  60  tablet    Refill:  0     Follow-up: Return in about 6 months (around 10/14/2019).  Scarlette Calico, MD

## 2019-04-15 ENCOUNTER — Other Ambulatory Visit: Payer: Self-pay | Admitting: Internal Medicine

## 2019-04-15 DIAGNOSIS — E038 Other specified hypothyroidism: Secondary | ICD-10-CM

## 2019-04-21 ENCOUNTER — Other Ambulatory Visit: Payer: Self-pay | Admitting: Internal Medicine

## 2019-05-13 ENCOUNTER — Other Ambulatory Visit: Payer: Self-pay | Admitting: Internal Medicine

## 2019-05-13 DIAGNOSIS — F418 Other specified anxiety disorders: Secondary | ICD-10-CM

## 2019-05-13 DIAGNOSIS — I10 Essential (primary) hypertension: Secondary | ICD-10-CM

## 2019-05-18 DIAGNOSIS — N184 Chronic kidney disease, stage 4 (severe): Secondary | ICD-10-CM | POA: Diagnosis not present

## 2019-05-18 DIAGNOSIS — R8281 Pyuria: Secondary | ICD-10-CM | POA: Diagnosis not present

## 2019-05-18 DIAGNOSIS — N183 Chronic kidney disease, stage 3 (moderate): Secondary | ICD-10-CM | POA: Diagnosis not present

## 2019-05-18 DIAGNOSIS — Z1159 Encounter for screening for other viral diseases: Secondary | ICD-10-CM | POA: Diagnosis not present

## 2019-05-21 ENCOUNTER — Other Ambulatory Visit: Payer: Self-pay | Admitting: Nephrology

## 2019-05-21 DIAGNOSIS — N183 Chronic kidney disease, stage 3 unspecified: Secondary | ICD-10-CM

## 2019-05-22 ENCOUNTER — Ambulatory Visit
Admission: RE | Admit: 2019-05-22 | Discharge: 2019-05-22 | Disposition: A | Discharge status: Home / Self Care | Payer: Medicare Other | Source: Ambulatory Visit | Attending: Nephrology | Admitting: Nephrology

## 2019-05-22 DIAGNOSIS — N189 Chronic kidney disease, unspecified: Secondary | ICD-10-CM | POA: Diagnosis not present

## 2019-05-22 DIAGNOSIS — N183 Chronic kidney disease, stage 3 unspecified: Secondary | ICD-10-CM

## 2019-05-27 ENCOUNTER — Encounter: Payer: Self-pay | Admitting: Internal Medicine

## 2019-05-28 ENCOUNTER — Other Ambulatory Visit: Payer: Self-pay | Admitting: Internal Medicine

## 2019-05-28 DIAGNOSIS — E038 Other specified hypothyroidism: Secondary | ICD-10-CM

## 2019-06-09 ENCOUNTER — Ambulatory Visit
Admission: RE | Admit: 2019-06-09 | Discharge: 2019-06-09 | Disposition: A | Discharge status: Home / Self Care | Payer: Medicare Other | Source: Ambulatory Visit | Attending: Internal Medicine | Admitting: Internal Medicine

## 2019-06-09 ENCOUNTER — Other Ambulatory Visit: Payer: Self-pay

## 2019-06-09 DIAGNOSIS — Z1231 Encounter for screening mammogram for malignant neoplasm of breast: Secondary | ICD-10-CM

## 2019-06-09 LAB — HM MAMMOGRAPHY

## 2019-06-10 ENCOUNTER — Other Ambulatory Visit: Payer: Self-pay | Admitting: Internal Medicine

## 2019-06-10 DIAGNOSIS — R928 Other abnormal and inconclusive findings on diagnostic imaging of breast: Secondary | ICD-10-CM

## 2019-06-12 ENCOUNTER — Other Ambulatory Visit: Payer: Self-pay | Admitting: Internal Medicine

## 2019-06-12 ENCOUNTER — Ambulatory Visit
Admission: RE | Admit: 2019-06-12 | Discharge: 2019-06-12 | Disposition: A | Discharge status: Home / Self Care | Payer: Medicare Other | Source: Ambulatory Visit | Attending: Internal Medicine | Admitting: Internal Medicine

## 2019-06-12 ENCOUNTER — Other Ambulatory Visit: Payer: Self-pay

## 2019-06-12 DIAGNOSIS — R921 Mammographic calcification found on diagnostic imaging of breast: Secondary | ICD-10-CM

## 2019-06-12 DIAGNOSIS — R928 Other abnormal and inconclusive findings on diagnostic imaging of breast: Secondary | ICD-10-CM

## 2019-06-18 ENCOUNTER — Ambulatory Visit
Admission: RE | Admit: 2019-06-18 | Discharge: 2019-06-18 | Disposition: A | Discharge status: Home / Self Care | Payer: Medicare Other | Source: Ambulatory Visit | Attending: Internal Medicine | Admitting: Internal Medicine

## 2019-06-18 ENCOUNTER — Other Ambulatory Visit: Payer: Self-pay

## 2019-06-18 DIAGNOSIS — R921 Mammographic calcification found on diagnostic imaging of breast: Secondary | ICD-10-CM

## 2019-06-18 DIAGNOSIS — N6315 Unspecified lump in the right breast, overlapping quadrants: Secondary | ICD-10-CM | POA: Diagnosis not present

## 2019-06-20 ENCOUNTER — Ambulatory Visit (INDEPENDENT_AMBULATORY_CARE_PROVIDER_SITE_OTHER): Payer: Medicare Other | Admitting: Family Medicine

## 2019-06-20 ENCOUNTER — Other Ambulatory Visit: Payer: Self-pay

## 2019-06-20 DIAGNOSIS — L03113 Cellulitis of right upper limb: Secondary | ICD-10-CM

## 2019-06-20 MED ORDER — MUPIROCIN 2 % EX OINT: 1.0000 "application " | TOPICAL_OINTMENT | Freq: Two times a day (BID) | CUTANEOUS | 0 refills | Status: DC

## 2019-06-20 MED ORDER — PREDNISONE 20 MG PO TABS: 60.0000 mg | ORAL_TABLET | Freq: Every day | ORAL | 0 refills | Status: AC

## 2019-06-20 MED ORDER — SULFAMETHOXAZOLE-TRIMETHOPRIM 800-160 MG PO TABS: 2.0000 | ORAL_TABLET | Freq: Two times a day (BID) | ORAL | 0 refills | Status: AC

## 2019-06-20 NOTE — Progress Notes (Signed)
Virtual Visit via Video Note  I connected with Leslie Edwards  on 06/20/19 at  9:40 AM EDT by a video enabled telemedicine application and verified that I am speaking with the correct person using two identifiers.  Location patient: home Location provider:work or home office Persons participating in the virtual visit: patient, provider  I discussed the limitations of evaluation and management by telemedicine and the availability of in person appointments. The patient expressed understanding and agreed to proceed.   Leslie Edwards DOB: [birth year] Encounter date: 06/20/2019  This is a 73 y.o. female who presents with No chief complaint on file.   History of present illness: On Thursday developed hot, red spot between wrist and elbow. Raised, hot, red. 12 hours later started to get tiny blisters. Now wrapping around arm. Hand swollen, hurts. Not sure if it was something that she did in yard, but states that blisters are smaller than those she has seen. Wakes her at night, but not unbearable. Worried that it was shingles.  No arm injury that she knew of. No scrapes or irritation prior to this.  Has had some chills, but no fever.   All the blisters are oozing, clear yellow liquid.    Allergies  Allergen Reactions  . Penicillins     REACTION: difficulty breathing  . Prochlorperazine Edisylate    No outpatient medications have been marked as taking for the 06/20/19 encounter (Office Visit) with Caren Macadam, MD.    Review of Systems  Constitutional: Positive for chills. Negative for fever.  Respiratory: Negative for cough, chest tightness and shortness of breath.   Cardiovascular: Negative for chest pain and leg swelling.  Skin: Positive for color change and rash.    Objective:  There were no vitals taken for this visit.      BP Readings from Last 3 Encounters:  04/13/19 (!) 148/88  03/16/19 124/76  10/30/18 (!) 160/86   Wt Readings from Last 3 Encounters:   04/13/19 194 lb (88 kg)  03/16/19 189 lb 12 oz (86.1 kg)  10/30/18 208 lb (94.3 kg)    EXAM:  GENERAL: alert, oriented, appears well and in no acute distress  HEENT: atraumatic, conjunctiva clear, no obvious abnormalities on inspection of external nose and ears  NECK: normal movements of the head and neck  LUNGS: on inspection no signs of respiratory distress, breathing rate appears normal, no obvious gross SOB, gasping or wheezing  CV: no obvious cyanosis  MS: moves all visible extremities without noticeable abnormality  PSYCH/NEURO: pleasant and cooperative, no obvious depression or anxiety, speech and thought processing grossly intact  Skin: Other difficult to fully assess via video, she has significant erythema right middle forearm with distinct border.  Skin has a diffuse blistering pattern.  This erythema wraps around the entire forearm with some sparing noted centrally on ventral aspect.  I am unable to appreciate a slight edema she reports in her upper forearm and upper aspect of hand.  Assessment/Plan  1. Cellulitis of right upper extremity We discussed that based on exam I do not feel that this is a shingles infection due to crossing over dermatomes in the forearm.  It appears to be a cellulitis with some blistering.  Rash is very itchy to her.  I am not clear if this started as a contact dermatitis and has progressed to an infection.  She has responded well in the past to prednisone and would like to try this to help with itching.  We discussed  that with infection present it would be ideal to avoid prednisone, but I am okay with a single dose to see if this helps her symptomatically.  We also discussed that itching and discomfort should improve with proper antibiotic treatment.  She has put arthritis cream and Voltaren over the rash.  I encouraged her not to put anything topically on the rash except Bactroban (which I will send to the pharmacy for her) to help keep skin  moisturized but without infection, especially as blisters are draining.  Encouraged her to cover her arm if going outside or if there is potential for skin getting dirty.  Let us know if any worsening of redness, pain.  I encouraged her to mark the edges of her erythema and monitor for worsening.  We will send a message to her PCP to get her in for follow-up this week for recheck.  - sulfamethoxazole-trimethoprim (BACTRIM DS) 800-160 MG tablet; Take 2 tablets by mouth 2 (two) times daily for 7 days.  Dispense: 28 tablet; Refill: 0 - mupirocin ointment (BACTROBAN) 2 %; Place 1 application into the nose 2 (two) times daily.  Dispense: 22 g; Refill: 0   Return in about 3 days (around 06/23/2019).  I discussed the assessment and treatment plan with the patient. The patient was provided an opportunity to ask questions and all were answered. The patient agreed with the plan and demonstrated an understanding of the instructions.   The patient was advised to call back or seek an in-person evaluation if the symptoms worsen or if the condition fails to improve as anticipated.  I provided 15 minutes of non-face-to-face time during this encounter.   Micheline Rough, MD

## 2019-06-22 ENCOUNTER — Encounter: Payer: Self-pay | Admitting: Internal Medicine

## 2019-06-22 ENCOUNTER — Telehealth: Payer: Self-pay | Admitting: *Deleted

## 2019-06-22 ENCOUNTER — Ambulatory Visit (INDEPENDENT_AMBULATORY_CARE_PROVIDER_SITE_OTHER): Payer: Medicare Other | Admitting: Internal Medicine

## 2019-06-22 ENCOUNTER — Other Ambulatory Visit: Payer: Self-pay

## 2019-06-22 VITALS — BP 150/70 | HR 83 | Temp 97.9°F | Resp 16 | Ht 64.0 in | Wt 192.8 lb

## 2019-06-22 DIAGNOSIS — L247 Irritant contact dermatitis due to plants, except food: Secondary | ICD-10-CM | POA: Insufficient documentation

## 2019-06-22 MED ORDER — LEVOCETIRIZINE DIHYDROCHLORIDE 5 MG PO TABS: 10.0000 mg | ORAL_TABLET | Freq: Every evening | ORAL | 0 refills | Status: DC

## 2019-06-22 MED ORDER — FLUOCINONIDE-E 0.05 % EX CREA: 1.0000 "application " | TOPICAL_CREAM | Freq: Two times a day (BID) | CUTANEOUS | 1 refills | Status: DC

## 2019-06-22 MED ORDER — METHYLPREDNISOLONE ACETATE 80 MG/ML IJ SUSP
80.0000 mg | Freq: Once | INTRAMUSCULAR | Status: AC
  Administered 2019-06-22: 120 mg via INTRAMUSCULAR

## 2019-06-22 NOTE — Telephone Encounter (Signed)
I called pt- she c/o red rash on lower right forearm and swelling up to her elbow. Her hand swelling is slightly better. She completed all the prednisone she was rx'd by Dr. Ethlyn Gallery on 06/20/19. She is still taking the Bactrim DS. She wonders if she needs more prednisone or just to come in today or tomorrow. Please advise.

## 2019-06-22 NOTE — Telephone Encounter (Signed)
Copied from [place] [id number]. Topic: General - Other >> Jun 22, 2019  8:48 AM Rainey Pines A wrote: Patient was advised to callback if her arm got worse. Patient would like a callback from Dr. Ronnald Ramp nurse in regards to band of blisters spreading up her arm from elbow all the way down. Patient stated that medication did not help

## 2019-06-22 NOTE — Telephone Encounter (Signed)
OV scheduled today @ 3:20.

## 2019-06-22 NOTE — Telephone Encounter (Signed)
Please come in today  TJ

## 2019-06-22 NOTE — Progress Notes (Signed)
Subjective:  Patient ID: Leslie Edwards, female    DOB: [birth year]  Age: 73 y.o. MRN: [id number]  CC: Rash   HPI [name] presents for concerns about a 4 day hx of rash on her right forearm and her left neck.  She noticed the rash 1 day after she had been gardening.  She tells me the rash causes a severe burning and itching sensation.  She saw someone a few days ago and has been treated for cellulitis but says the rash is not improving.  She says there is been faint blistering but the exudate has been clear.  Outpatient Medications Prior to Visit  Medication Sig Dispense Refill   albuterol (PROVENTIL HFA;VENTOLIN HFA) 108 (90 Base) MCG/ACT inhaler Inhale 2 puffs into the lungs every 6 (six) hours as needed. 18 g 11   allopurinol (ZYLOPRIM) 100 MG tablet Take 1 tablet (100 mg total) by mouth daily. 90 tablet 1   budesonide-formoterol (SYMBICORT) 160-4.5 MCG/ACT inhaler Inhale 2 puffs into the lungs 2 (two) times daily. 1 Inhaler 11   buPROPion (WELLBUTRIN XL) 150 MG 24 hr tablet TAKE 2 TABLETS IN THE MORNING AND 1 TABLET IN THE EVENING. 270 tablet 1   Colchicine (MITIGARE) 0.6 MG CAPS Take 1 tablet by mouth 2 (two) times daily. 180 capsule 0   hyoscyamine (LEVSIN SL) 0.125 MG SL tablet Place 1 tablet (0.125 mg total) under the tongue every 4 (four) hours as needed. 30 tablet 11   ipratropium-albuterol (DUONEB) 0.5-2.5 (3) MG/3ML SOLN Take 3 mLs by nebulization as needed. 360 mL 1   liothyronine (CYTOMEL) 25 MCG tablet TAKE 1 TABLET ONCE DAILY. 90 tablet 0   liothyronine (CYTOMEL) 5 MCG tablet TAKE 1 TABLET ONCE DAILY. 90 tablet 0   metolazone (ZAROXOLYN) 2.5 MG tablet TAKE 1 TABLET ONCE DAILY. 90 tablet 0   mometasone (NASONEX) 50 MCG/ACT nasal spray USE 2 SPRAYS IN EACH NOSTRIL ONCE DAILY 17 g 11   mupirocin ointment (BACTROBAN) 2 % Apply 1 application topically 2 (two) times daily. 22 g 0   omega-3 acid ethyl esters (LOVAZA) 1 g capsule TAKE (2) CAPSULES BY  MOUTH TWICE DAILY. 360 capsule 1   oxyCODONE (OXY IR/ROXICODONE) 5 MG immediate release tablet Take 1 tablet (5 mg total) by mouth every 6 (six) hours as needed for severe pain. 60 tablet 0   potassium chloride (K-DUR) 10 MEQ tablet TAKE 1 TABLET BY MOUTH DAILY. 90 tablet 1   predniSONE (DELTASONE) 20 MG tablet Take 3 tablets (60 mg total) by mouth daily with breakfast for 2 days. 6 tablet 0   sulfamethoxazole-trimethoprim (BACTRIM DS) 800-160 MG tablet Take 2 tablets by mouth 2 (two) times daily for 7 days. 28 tablet 0   SYNTHROID 25 MCG tablet TAKE 1 TABLET ONCE DAILY. 90 tablet 1   torsemide (DEMADEX) 20 MG tablet TAKE 1 TABLET ONCE DAILY. 90 tablet 1   tretinoin (RETIN-A) 0.025 % cream Apply topically at bedtime. Reported on 12/21/2015 45 g 5   VIIBRYD 40 MG TABS TAKE 1 TABLET ONCE DAILY. 90 tablet 1   No facility-administered medications prior to visit.     ROS Review of Systems  Constitutional: Negative for chills, fatigue and fever.  HENT: Negative.  Negative for trouble swallowing.   Eyes: Negative.   Respiratory: Negative for cough, chest tightness, shortness of breath and wheezing.   Cardiovascular: Negative for chest pain, palpitations and leg swelling.  Gastrointestinal: Negative for abdominal pain, diarrhea,  nausea and vomiting.  Endocrine: Negative.   Genitourinary: Negative.  Negative for difficulty urinating.  Musculoskeletal: Negative for arthralgias.  Skin: Positive for rash. Negative for color change.  Neurological: Negative.   Hematological: Negative for adenopathy. Does not bruise/bleed easily.  Psychiatric/Behavioral: Negative.     Objective:  BP (!) 150/70 (BP Location: Left Arm, Patient Position: Sitting, Cuff Size: Normal)    Pulse 83    Temp 97.9 F (36.6 C) (Oral)    Resp 16    Ht 5\' 4"  (1.626 m)    Wt 192 lb 12 oz (87.4 kg)    SpO2 97%    BMI 33.09 kg/m   BP Readings from Last 3 Encounters:  06/22/19 (!) 150/70  04/13/19 (!) 148/88  03/16/19  124/76    Wt Readings from Last 3 Encounters:  06/22/19 192 lb 12 oz (87.4 kg)  04/13/19 194 lb (88 kg)  03/16/19 189 lb 12 oz (86.1 kg)    Physical Exam Vitals signs reviewed.  Constitutional:      General: She is not in acute distress.    Appearance: Normal appearance. She is not ill-appearing, toxic-appearing or diaphoretic.  HENT:     Nose: Nose normal.     Mouth/Throat:     Mouth: Mucous membranes are moist.  Eyes:     General: No scleral icterus.    Conjunctiva/sclera: Conjunctivae normal.  Neck:     Musculoskeletal: Normal range of motion.  Cardiovascular:     Rate and Rhythm: Normal rate and regular rhythm.  Pulmonary:     Effort: Pulmonary effort is normal.     Breath sounds: No stridor. No wheezing, rhonchi or rales.  Abdominal:     General: Abdomen is protuberant. Bowel sounds are normal.     Palpations: There is no hepatomegaly or splenomegaly.     Tenderness: There is no abdominal tenderness.  Musculoskeletal: Normal range of motion.  Lymphadenopathy:     Cervical: No cervical adenopathy.  Skin:    Coloration: Skin is not pale.     Findings: Rash present.     Comments: Over the right distal forearm there is a confluent swath of coalesced papules, vesicles, and erythema.  This is more prominent on the dorsal side than the volar side.  There is no warmth, exudate, induration, fluctuance, or streaking.  The edges are very well demarcated.  See photo.  There are 2 erythematous macules on the left side of the neck with scale.  See photo.  Neurological:     General: No focal deficit present.     Mental Status: She is alert.     Lab Results  Component Value Date   WBC 5.9 10/30/2018   HGB 13.2 10/30/2018   HCT 38.7 10/30/2018   PLT 237.0 10/30/2018   GLUCOSE 112 (H) 03/16/2019   CHOL 209 (H) 10/30/2018   TRIG 180.0 (H) 10/30/2018   HDL 83.20 10/30/2018   LDLDIRECT 111.0 10/31/2015   LDLCALC 90 10/30/2018   ALT 17 10/30/2018   AST 18 10/30/2018   NA  136 03/16/2019   K 3.4 (L) 03/16/2019   CL 94 (L) 03/16/2019   CREATININE 1.86 (H) 03/16/2019   BUN 35 (H) 03/16/2019   CO2 27 03/16/2019   TSH 0.38 03/16/2019   HGBA1C 5.3 04/28/2009    Mm Clip Placement Right  Result Date: 06/18/2019 CLINICAL DATA:  Patient underwent biopsy of right breast calcifications. EXAM: DIAGNOSTIC RIGHT MAMMOGRAM POST STEREOTACTIC BIOPSY COMPARISON:  Previous exam(s). FINDINGS: Mammographic images were  obtained following stereotactic guided biopsy of calcifications in the right breast at 12 o'clock. There is placement of a coil shaped clip at the biopsy site which is located approximately 1.0 cm inferior to the residual calcifications on the MLO view. IMPRESSION: Postprocedure mammogram demonstrates placement of a biopsy clip located approximately 1 cm inferior to the residual calcifications that were biopsied in the right breast at 12 o'clock. Final Assessment: Post Procedure Mammograms for Marker Placement Electronically Signed   By: Audie Pinto M.D.   On: 06/18/2019 09:18   Mm Rt Breast Bx W Loc Dev 1st Lesion Image Bx Spec Stereo Guide  Addendum Date: 06/19/2019   ADDENDUM REPORT: 06/19/2019 13:15 ADDENDUM: Pathology revealed SCLEROTIC FIBROADENOMATOID NODULE WITH CALCIFICATIONS of the RIGHT breast, 12 o'clock. This was found to be concordant by Dr. Audie Pinto. Pathology results were discussed with the patient by telephone. The patient reported doing well after the biopsy with tenderness at the site. Post biopsy instructions and care were reviewed and questions were answered. The patient was encouraged to call [facility] for any additional concerns. The patient was instructed to return for annual screening mammography and informed a reminder notice would be sent regarding this appointment. Pathology results reported by Stacie Acres, RN on 06/19/2019. Electronically Signed   By: Audie Pinto M.D.   On: 06/19/2019 13:15    Result Date: 06/19/2019 CLINICAL DATA:  Patient presents for biopsy of right breast calcifications. EXAM: RIGHT BREAST STEREOTACTIC CORE NEEDLE BIOPSY COMPARISON:  Previous exams. FINDINGS: The patient and I discussed the procedure of stereotactic-guided biopsy including benefits and alternatives. We discussed the high likelihood of a successful procedure. We discussed the risks of the procedure including infection, bleeding, tissue injury, clip migration, and inadequate sampling. Informed written consent was given. The usual time out protocol was performed immediately prior to the procedure. Using sterile technique and 1% Lidocaine as local anesthetic, under stereotactic guidance, a 9 gauge vacuum assisted device was used to perform core needle biopsy of calcifications in the right breast at 12 o'clock using a superior approach. Specimen radiograph was performed showing 2 of 6 specimens with calcifications. Specimens with calcifications are identified for pathology. Lesion quadrant: Upper outer quadrant At the conclusion of the procedure, coil tissue marker clip was deployed into the biopsy cavity. Follow-up 2-view mammogram was performed and dictated separately. IMPRESSION: Stereotactic-guided biopsy of right breast calcifications at 12 o'clock. No apparent complications. Electronically Signed: By: Audie Pinto M.D. On: 06/18/2019 09:14    Assessment & Plan:   Lil was seen today for rash.  Diagnoses and all orders for this visit:  Contact dermatitis and eczema due to plant -     levocetirizine (XYZAL) 5 MG tablet; Take 2 tablets (10 mg total) by mouth every evening for 7 days. -     fluocinonide-emollient (LIDEX-E) 0.05 % cream; Apply 1 application topically 2 (two) times daily. -     methylPREDNISolone acetate (DEPO-MEDROL) injection 80 mg   I am having Novis H. Vinje start on levocetirizine and fluocinonide-emollient. I am also having her maintain her hyoscyamine,  budesonide-formoterol, albuterol, ipratropium-albuterol, mometasone, tretinoin, omega-3 acid ethyl esters, Colchicine, allopurinol, oxyCODONE, potassium chloride, buPROPion, Synthroid, metolazone, torsemide, Viibryd, liothyronine, liothyronine, sulfamethoxazole-trimethoprim, and mupirocin ointment. We administered methylPREDNISolone acetate.  Meds ordered this encounter  Medications   levocetirizine (XYZAL) 5 MG tablet    Sig: Take 2 tablets (10 mg total) by mouth every evening for 7 days.    Dispense:  14 tablet  Refill:  0   fluocinonide-emollient (LIDEX-E) 0.05 % cream    Sig: Apply 1 application topically 2 (two) times daily.    Dispense:  60 g    Refill:  1   methylPREDNISolone acetate (DEPO-MEDROL) injection 80 mg     Follow-up: Return if symptoms worsen or fail to improve.  Scarlette Calico, MD

## 2019-06-22 NOTE — Patient Instructions (Signed)
Poison Ivy Dermatitis Poison ivy dermatitis is inflammation of the skin that is caused by chemicals in the leaves of the poison ivy plant. The skin reaction often involves redness, swelling, blisters, and extreme itching. What are the causes? This condition is caused by a chemical (urushiol) found in the sap of the poison ivy plant. This chemical is sticky and can be easily spread to people, animals, and objects. You can get poison ivy dermatitis by:  Having direct contact with a poison ivy plant.  Touching animals, other people, or objects that have come in contact with poison ivy and have the chemical on them. What increases the risk? This condition is more likely to develop in people who:  Are outdoors often in wooded or [place] areas.  Go outdoors without wearing protective clothing, such as closed shoes, long pants, and a long-sleeved shirt. What are the signs or symptoms? Symptoms of this condition include:  Redness of the skin.  Extreme itching.  A rash that often includes bumps and blisters. The rash usually appears 48 hours after exposure, if you have been exposed before. If this is the first time you have been exposed, the rash may not appear until a week after exposure.  Swelling. This may occur if the reaction is more severe. Symptoms usually last for 1-2 weeks. However, the first time you develop this condition, symptoms may last 3-4 weeks. How is this diagnosed? This condition may be diagnosed based on your symptoms and a physical exam. Your health care provider may also ask you about any recent outdoor activity. How is this treated? Treatment for this condition will vary depending on how severe it is. Treatment may include:  Hydrocortisone cream or calamine lotion to relieve itching.  Oatmeal baths to soothe the skin.  Medicines, such as over-the-counter antihistamine tablets.  Oral steroid medicine, for more severe reactions. Follow these instructions at home:  Medicines  Take or apply over-the-counter and prescription medicines only as told by your health care provider.  Use hydrocortisone cream or calamine lotion as needed to soothe the skin and relieve itching. General instructions  Do not scratch or rub your skin.  Apply a cold, wet cloth (cold compress) to the affected areas or take baths in cool water. This will help with itching. Avoid hot baths and showers.  Take oatmeal baths as needed. Use colloidal oatmeal. You can get this at your local pharmacy or grocery store. Follow the instructions on the packaging.  While you have the rash, wash clothes right after you wear them.  Keep all follow-up visits as told by your health care provider. This is important. How is this prevented?   Learn to identify the poison ivy plant and avoid contact with the plant. This plant can be recognized by the number of leaves. Generally, poison ivy has three leaves with flowering branches on a single stem. The leaves are typically glossy, and they have jagged edges that come to a point at the front.  If you have been exposed to poison ivy, thoroughly wash with soap and water right away. You have about 30 minutes to remove the plant resin before it will cause the rash. Be sure to wash under your fingernails, because any plant resin there will continue to spread the rash.  When hiking or camping, wear clothes that will help you to avoid exposure on the skin. This includes long pants, a long-sleeved shirt, tall socks, and hiking boots. You can also apply preventive lotion to your skin to  help limit exposure.  If you suspect that your clothes or outdoor gear came in contact with poison ivy, rinse them off outside with a garden hose before you bring them inside your house.  When doing yard work or gardening, wear gloves, long sleeves, long pants, and boots. Wash your garden tools and gloves if they come in contact with poison ivy.  If you suspect that your pet  has come into contact with poison ivy, wash him or her with pet shampoo and water. Make sure to wear gloves while washing your pet. Contact a health care provider if you have:  Open sores in the rash area.  More redness, swelling, or pain in the affected area.  Redness that spreads beyond the rash area.  Fluid, blood, or pus coming from the affected area.  A fever.  A rash over a large area of your body.  A rash on your eyes, mouth, or genitals.  A rash that does not improve after a few weeks. Get help right away if:  Your face swells or your eyes swell shut.  You have trouble breathing.  You have trouble swallowing. These symptoms may represent a serious problem that is an emergency. Do not wait to see if the symptoms will go away. Get medical help right away. Call your local emergency services (911 in the [place] Do not drive yourself to the hospital. Summary  Poison ivy dermatitis is inflammation of the skin that is caused by chemicals in the leaves of the poison ivy plant.  Symptoms of this condition include redness, itching, a rash, and swelling.  Do not scratch or rub your skin.  Take or apply over-the-counter and prescription medicines only as told by your health care provider. This information is not intended to replace advice given to you by your health care provider. Make sure you discuss any questions you have with your health care provider. Document Released: 08/31/2000 Document Revised: 12/26/2018 Document Reviewed: 08/29/2018 Elsevier Patient Education  2020 [employer].

## 2019-06-30 ENCOUNTER — Other Ambulatory Visit: Payer: Self-pay

## 2019-06-30 ENCOUNTER — Ambulatory Visit (INDEPENDENT_AMBULATORY_CARE_PROVIDER_SITE_OTHER): Payer: Medicare Other

## 2019-06-30 DIAGNOSIS — Z23 Encounter for immunization: Secondary | ICD-10-CM | POA: Diagnosis not present

## 2019-07-13 ENCOUNTER — Other Ambulatory Visit: Payer: Self-pay | Admitting: Internal Medicine

## 2019-07-13 DIAGNOSIS — E038 Other specified hypothyroidism: Secondary | ICD-10-CM

## 2019-10-07 ENCOUNTER — Ambulatory Visit: Payer: Medicare Other | Attending: Internal Medicine

## 2019-10-07 DIAGNOSIS — Z23 Encounter for immunization: Secondary | ICD-10-CM

## 2019-10-07 NOTE — Progress Notes (Signed)
   Covid-[facility] Vaccination [facility]  Name:  DALORES WEGER    MRN: [id number] DOB: 1946-01-17  10/07/2019  Ms. Boberg was observed post Covid-19 immunization for 15 minutes without incidence. She was provided with Vaccine Information Sheet and instruction to access the V-Safe system.   Ms. Kirtley was instructed to call 911 with any severe reactions post vaccine: Marland Kitchen Difficulty breathing  . Swelling of your face and throat  . A fast heartbeat  . A bad rash all over your body  . Dizziness and weakness    Immunizations Administered    Name Date Dose VIS Date Route   Pfizer COVID-19 Vaccine 10/07/2019  3:10 PM 0.3 mL 08/28/2019 Intramuscular   Manufacturer: [place]   Lot: [id number]   [place]: [id number]

## 2019-10-14 ENCOUNTER — Other Ambulatory Visit: Payer: Self-pay

## 2019-10-14 ENCOUNTER — Ambulatory Visit (INDEPENDENT_AMBULATORY_CARE_PROVIDER_SITE_OTHER): Payer: Medicare Other | Admitting: Internal Medicine

## 2019-10-14 ENCOUNTER — Encounter: Payer: Self-pay | Admitting: Internal Medicine

## 2019-10-14 VITALS — BP 136/78 | HR 82 | Temp 98.3°F | Resp 16 | Ht 64.0 in | Wt 203.0 lb

## 2019-10-14 DIAGNOSIS — N184 Chronic kidney disease, stage 4 (severe): Secondary | ICD-10-CM

## 2019-10-14 DIAGNOSIS — Z0000 Encounter for general adult medical examination without abnormal findings: Secondary | ICD-10-CM | POA: Diagnosis not present

## 2019-10-14 DIAGNOSIS — E785 Hyperlipidemia, unspecified: Secondary | ICD-10-CM

## 2019-10-14 DIAGNOSIS — E213 Hyperparathyroidism, unspecified: Secondary | ICD-10-CM | POA: Diagnosis not present

## 2019-10-14 DIAGNOSIS — E781 Pure hyperglyceridemia: Secondary | ICD-10-CM | POA: Diagnosis not present

## 2019-10-14 DIAGNOSIS — M1A39X0 Chronic gout due to renal impairment, multiple sites, without tophus (tophi): Secondary | ICD-10-CM

## 2019-10-14 DIAGNOSIS — I10 Essential (primary) hypertension: Secondary | ICD-10-CM | POA: Diagnosis not present

## 2019-10-14 DIAGNOSIS — Z1159 Encounter for screening for other viral diseases: Secondary | ICD-10-CM | POA: Diagnosis not present

## 2019-10-14 DIAGNOSIS — E038 Other specified hypothyroidism: Secondary | ICD-10-CM | POA: Diagnosis not present

## 2019-10-14 LAB — URINALYSIS, ROUTINE W REFLEX MICROSCOPIC
Bilirubin Urine: NEGATIVE
Hgb urine dipstick: NEGATIVE
Ketones, ur: NEGATIVE
Leukocytes,Ua: NEGATIVE
Nitrite: NEGATIVE
RBC / HPF: NONE SEEN (ref 0–?)
Specific Gravity, Urine: 1.015 (ref 1.000–1.030)
Total Protein, Urine: NEGATIVE
Urine Glucose: NEGATIVE
Urobilinogen, UA: 0.2 (ref 0.0–1.0)
pH: 6.5 (ref 5.0–8.0)

## 2019-10-14 LAB — CBC WITH DIFFERENTIAL/PLATELET
Basophils Absolute: 0.1 10*3/uL (ref 0.0–0.1)
Basophils Relative: 1.2 % (ref 0.0–3.0)
Eosinophils Absolute: 0.3 10*3/uL (ref 0.0–0.7)
Eosinophils Relative: 4.6 % (ref 0.0–5.0)
HCT: 39.3 % (ref 36.0–46.0)
Hemoglobin: 13.3 g/dL (ref 12.0–15.0)
Lymphocytes Relative: 25.5 % (ref 12.0–46.0)
Lymphs Abs: 1.7 10*3/uL (ref 0.7–4.0)
MCHC: 33.7 g/dL (ref 30.0–36.0)
MCV: 99.5 fl (ref 78.0–100.0)
Monocytes Absolute: 0.6 10*3/uL (ref 0.1–1.0)
Monocytes Relative: 8.4 % (ref 3.0–12.0)
Neutro Abs: 3.9 10*3/uL (ref 1.4–7.7)
Neutrophils Relative %: 60.3 % (ref 43.0–77.0)
Platelets: 293 10*3/uL (ref 150.0–400.0)
RBC: 3.95 Mil/uL (ref 3.87–5.11)
RDW: 13.1 % (ref 11.5–15.5)
WBC: 6.5 10*3/uL (ref 4.0–10.5)

## 2019-10-14 LAB — LIPID PANEL
Cholesterol: 215 mg/dL — ABNORMAL HIGH (ref 0–200)
HDL: 71.8 mg/dL (ref >39.00)
NonHDL: 142.83
Total CHOL/HDL Ratio: 3
Triglycerides: 280 mg/dL — ABNORMAL HIGH (ref 0.0–149.0)
VLDL: 56 mg/dL — ABNORMAL HIGH (ref 0.0–40.0)

## 2019-10-14 LAB — BASIC METABOLIC PANEL
BUN: 24 mg/dL — ABNORMAL HIGH (ref 6–23)
CO2: 28 mEq/L (ref 19–32)
Calcium: 9.7 mg/dL (ref 8.4–10.5)
Chloride: 107 mEq/L (ref 96–112)
Creatinine, Ser: 1.35 mg/dL — ABNORMAL HIGH (ref 0.40–1.20)
GFR: 38.26 mL/min — ABNORMAL LOW (ref >60.00)
Glucose, Bld: 92 mg/dL (ref 70–99)
Potassium: 3.9 mEq/L (ref 3.5–5.1)
Sodium: 141 mEq/L (ref 135–145)

## 2019-10-14 LAB — LDL CHOLESTEROL, DIRECT: Direct LDL: 107 mg/dL

## 2019-10-14 LAB — URIC ACID: Uric Acid, Serum: 6.4 mg/dL (ref 2.4–7.0)

## 2019-10-14 LAB — VITAMIN D 25 HYDROXY (VIT D DEFICIENCY, FRACTURES): VITD: 34.42 ng/mL (ref 30.00–100.00)

## 2019-10-14 NOTE — Progress Notes (Signed)
Subjective:  Patient ID: Leslie Edwards, female    DOB: [birth year]  Age: 74 y.o. MRN: [id number]  CC: Annual Exam, Hypertension, Hypothyroidism, and Hyperlipidemia   This visit occurred during the SARS-CoV-2 public health emergency.  Safety protocols were in place, including screening questions prior to the visit, additional usage of staff PPE, and extensive cleaning of exam room while observing appropriate contact time as indicated for disinfecting solutions.    HPI Leslie Edwards presents for a CPX.  She complains of weight gain and cold intolerance.  She tells me she is compliant with her thyroid replacement therapy.  She tells me her blood pressure has been well controlled and she has had no lower extremity edema.  She is no longer taking metolazone.  Outpatient Medications Prior to Visit  Medication Sig Dispense Refill  . albuterol (PROVENTIL HFA;VENTOLIN HFA) 108 (90 Base) MCG/ACT inhaler Inhale 2 puffs into the lungs every 6 (six) hours as needed. 18 g 11  . allopurinol (ZYLOPRIM) 100 MG tablet Take 1 tablet (100 mg total) by mouth daily. 90 tablet 1  . budesonide-formoterol (SYMBICORT) 160-4.5 MCG/ACT inhaler Inhale 2 puffs into the lungs 2 (two) times daily. 1 Inhaler 11  . buPROPion (WELLBUTRIN XL) 150 MG 24 hr tablet TAKE 2 TABLETS IN THE MORNING AND 1 TABLET IN THE EVENING. 270 tablet 1  . Colchicine (MITIGARE) 0.6 MG CAPS Take 1 tablet by mouth 2 (two) times daily. 180 capsule 0  . hyoscyamine (LEVSIN SL) 0.125 MG SL tablet Place 1 tablet (0.125 mg total) under the tongue every 4 (four) hours as needed. 30 tablet 11  . ipratropium-albuterol (DUONEB) 0.5-2.5 (3) MG/3ML SOLN Take 3 mLs by nebulization as needed. 360 mL 1  . liothyronine (CYTOMEL) 25 MCG tablet TAKE 1 TABLET ONCE DAILY. 90 tablet 0  . liothyronine (CYTOMEL) 5 MCG tablet TAKE 1 TABLET ONCE DAILY. 90 tablet 0  . oxyCODONE (OXY IR/ROXICODONE) 5 MG immediate release tablet Take 1 tablet (5 mg total) by mouth  every 6 (six) hours as needed for severe pain. 60 tablet 0  . potassium chloride (K-DUR) 10 MEQ tablet TAKE 1 TABLET BY MOUTH DAILY. 90 tablet 1  . SYNTHROID 25 MCG tablet TAKE 1 TABLET ONCE DAILY. 90 tablet 1  . torsemide (DEMADEX) 20 MG tablet TAKE 1 TABLET ONCE DAILY. 90 tablet 1  . VIIBRYD 40 MG TABS TAKE 1 TABLET ONCE DAILY. 90 tablet 1  . omega-3 acid ethyl esters (LOVAZA) 1 g capsule TAKE (2) CAPSULES BY MOUTH TWICE DAILY. 360 capsule 1  . levocetirizine (XYZAL) 5 MG tablet Take 2 tablets (10 mg total) by mouth every evening for 7 days. 14 tablet 0  . fluocinonide-emollient (LIDEX-E) 0.05 % cream Apply 1 application topically 2 (two) times daily. 60 g 1  . metolazone (ZAROXOLYN) 2.5 MG tablet TAKE 1 TABLET ONCE DAILY. 90 tablet 0  . mometasone (NASONEX) 50 MCG/ACT nasal spray USE 2 SPRAYS IN EACH NOSTRIL ONCE DAILY 17 g 11  . mupirocin ointment (BACTROBAN) 2 % Apply 1 application topically 2 (two) times daily. 22 g 0  . tretinoin (RETIN-A) 0.025 % cream Apply topically at bedtime. Reported on 12/21/2015 45 g 5   No facility-administered medications prior to visit.    ROS Review of Systems  Constitutional: Positive for unexpected weight change (wt gain). Negative for diaphoresis and fatigue.  HENT: Negative.   Respiratory: Negative.  Negative for cough, chest tightness, shortness of breath and wheezing.   Cardiovascular: Negative for chest  pain, palpitations and leg swelling.  Gastrointestinal: Negative for abdominal pain, constipation, diarrhea, nausea and vomiting.  Endocrine: Positive for cold intolerance. Negative for heat intolerance.  Genitourinary: Negative.  Negative for difficulty urinating, dysuria and hematuria.  Musculoskeletal: Positive for back pain. Negative for arthralgias and myalgias.  Skin: Negative.   Neurological: Negative.  Negative for dizziness, weakness, light-headedness and headaches.  Hematological: Negative for adenopathy. Does not bruise/bleed easily.    Psychiatric/Behavioral: Negative.     Objective:  BP 136/78 (BP Location: Left Arm, Patient Position: Sitting, Cuff Size: Normal)   Pulse 82   Temp 98.3 F (36.8 C) (Oral)   Resp 16   Ht '5\' 4"'  (1.626 m)   Wt 203 lb (92.1 kg)   SpO2 95%   BMI 34.84 kg/m   BP Readings from Last 3 Encounters:  10/14/19 136/78  06/22/19 (!) 150/70  04/13/19 (!) 148/88    Wt Readings from Last 3 Encounters:  10/14/19 203 lb (92.1 kg)  06/22/19 192 lb 12 oz (87.4 kg)  04/13/19 194 lb (88 kg)    Physical Exam Vitals reviewed.  Constitutional:      Appearance: She is obese. She is not ill-appearing or diaphoretic.  HENT:     Nose: Nose normal.     Mouth/Throat:     Mouth: Mucous membranes are moist.  Eyes:     General: No scleral icterus.    Conjunctiva/sclera: Conjunctivae normal.  Cardiovascular:     Rate and Rhythm: Normal rate and regular rhythm.     Heart sounds: Murmur present. Systolic murmur present with a grade of 1/6. No diastolic murmur. No gallop.   Pulmonary:     Effort: Pulmonary effort is normal.     Breath sounds: No stridor. No wheezing, rhonchi or rales.  Abdominal:     General: Abdomen is protuberant. Bowel sounds are normal. There is no distension.     Palpations: Abdomen is soft. There is no hepatomegaly or splenomegaly.     Tenderness: There is no abdominal tenderness.  Musculoskeletal:        General: Normal range of motion.     Cervical back: Neck supple.     Right lower leg: No edema.     Left lower leg: No edema.  Lymphadenopathy:     Cervical: No cervical adenopathy.  Skin:    General: Skin is warm and dry.     Coloration: Skin is not pale.  Neurological:     General: No focal deficit present.     Mental Status: She is alert.  Psychiatric:        Mood and Affect: Mood normal.        Behavior: Behavior normal.     Lab Results  Component Value Date   WBC 6.5 10/14/2019   HGB 13.3 10/14/2019   HCT 39.3 10/14/2019   PLT 293.0 10/14/2019    GLUCOSE 92 10/14/2019   CHOL 215 (H) 10/14/2019   TRIG 280.0 (H) 10/14/2019   HDL 71.80 10/14/2019   LDLDIRECT 107.0 10/14/2019   LDLCALC 90 10/30/2018   ALT 17 10/30/2018   AST 18 10/30/2018   NA 141 10/14/2019   K 3.9 10/14/2019   CL 107 10/14/2019   CREATININE 1.35 (H) 10/14/2019   BUN 24 (H) 10/14/2019   CO2 28 10/14/2019   TSH 2.69 10/14/2019   HGBA1C 5.3 04/28/2009    MM CLIP PLACEMENT RIGHT  Result Date: 06/18/2019 CLINICAL DATA:  Patient underwent biopsy of right breast calcifications. EXAM: DIAGNOSTIC RIGHT  MAMMOGRAM POST STEREOTACTIC BIOPSY COMPARISON:  Previous exam(s). FINDINGS: Mammographic images were obtained following stereotactic guided biopsy of calcifications in the right breast at 12 o'clock. There is placement of a coil shaped clip at the biopsy site which is located approximately 1.0 cm inferior to the residual calcifications on the MLO view. IMPRESSION: Postprocedure mammogram demonstrates placement of a biopsy clip located approximately 1 cm inferior to the residual calcifications that were biopsied in the right breast at 12 o'clock. Final Assessment: Post Procedure Mammograms for Marker Placement Electronically Signed   By: Audie Pinto M.D.   On: 06/18/2019 09:18   MM RT BREAST BX W LOC DEV 1ST LESION IMAGE BX SPEC STEREO GUIDE  Addendum Date: 06/19/2019   ADDENDUM REPORT: 06/19/2019 13:15 ADDENDUM: Pathology revealed SCLEROTIC FIBROADENOMATOID NODULE WITH CALCIFICATIONS of the RIGHT breast, 12 o'clock. This was found to be concordant by Dr. Audie Pinto. Pathology results were discussed with the patient by telephone. The patient reported doing well after the biopsy with tenderness at the site. Post biopsy instructions and care were reviewed and questions were answered. The patient was encouraged to call [facility] for any additional concerns. The patient was instructed to return for annual screening mammography and informed a  reminder notice would be sent regarding this appointment. Pathology results reported by Stacie Acres, RN on 06/19/2019. Electronically Signed   By: Audie Pinto M.D.   On: 06/19/2019 13:15   Result Date: 06/19/2019 CLINICAL DATA:  Patient presents for biopsy of right breast calcifications. EXAM: RIGHT BREAST STEREOTACTIC CORE NEEDLE BIOPSY COMPARISON:  Previous exams. FINDINGS: The patient and I discussed the procedure of stereotactic-guided biopsy including benefits and alternatives. We discussed the high likelihood of a successful procedure. We discussed the risks of the procedure including infection, bleeding, tissue injury, clip migration, and inadequate sampling. Informed written consent was given. The usual time out protocol was performed immediately prior to the procedure. Using sterile technique and 1% Lidocaine as local anesthetic, under stereotactic guidance, a 9 gauge vacuum assisted device was used to perform core needle biopsy of calcifications in the right breast at 12 o'clock using a superior approach. Specimen radiograph was performed showing 2 of 6 specimens with calcifications. Specimens with calcifications are identified for pathology. Lesion quadrant: Upper outer quadrant At the conclusion of the procedure, coil tissue marker clip was deployed into the biopsy cavity. Follow-up 2-view mammogram was performed and dictated separately. IMPRESSION: Stereotactic-guided biopsy of right breast calcifications at 12 o'clock. No apparent complications. Electronically Signed: By: Audie Pinto M.D. On: 06/18/2019 09:14    Assessment & Plan:   [place] was seen today for annual exam, hypertension, hypothyroidism and hyperlipidemia.  Diagnoses and all orders for this visit:  Essential hypertension- Her blood pressure is adequately well controlled.  Her renal function has improved some.  Electrolytes are normal.  Will continue the current dose of the loop diuretic. -     CBC with  Differential/Platelet -     Thyroid Panel With TSH  Chronic gout due to renal impairment of multiple sites without tophus- She has achieved her uric acid goal with no recent exacerbations.  Will continue the current dose of allopurinol. -     Uric acid  Pure hyperglyceridemia- Her triglycerides remain mildly elevated.  I have asked her to improve her lifestyle modifications and continue taking the omega-3 fish oil. -     Lipid panel -     omega-3 acid ethyl esters (LOVAZA) 1 g capsule; TAKE (  2) CAPSULES BY MOUTH TWICE DAILY.  Kidney disease, chronic, stage IV (GFR 15-29 ml/min) ([place])- Her renal function has improved some.  She will avoid nephrotoxic agents. -     CBC with Differential/Platelet -     Urinalysis, Routine w reflex microscopic  Other specified hypothyroidism- Her TFTs are in the normal range.  She will continue the current dose of thyroid replacement. -     Cancel: TSH -     Thyroid Panel With TSH  Parathyroid hormone excess ([place])- Her calcium and PTH levels are normal now. -     Basic metabolic panel -     VITAMIN D 25 Hydroxy (Vit-D Deficiency, Fractures) -     PTH, intact and calcium  Need for hepatitis C screening test- Her hep C antibody is negative. -     Hepatitis C antibody  Routine general medical examination at a health care facility- Exam completed, labs reviewed, vaccines reviewed, cervical cancer screening and breast cancer screenings are up-to-date, she has a Cologuard kit which she can use to screen for colon cancer, patient education was given.  Hyperlipidemia with target LDL less than 100- She has a modestly elevated ASCVD risk score so have asked her to start taking a statin for CV risk reduction. -     rosuvastatin (CRESTOR) 10 MG tablet; Take 1 tablet (10 mg total) by mouth daily.  Other orders -     LDL cholesterol, direct   I have discontinued Rosia H. Leppo's mometasone, tretinoin, metolazone, mupirocin ointment, and fluocinonide-emollient. I  am also having her start on rosuvastatin. Additionally, I am having her maintain her hyoscyamine, budesonide-formoterol, albuterol, ipratropium-albuterol, Colchicine, allopurinol, oxyCODONE, potassium chloride, buPROPion, Synthroid, torsemide, Viibryd, levocetirizine, liothyronine, liothyronine, and omega-3 acid ethyl esters.  Meds ordered this encounter  Medications  . rosuvastatin (CRESTOR) 10 MG tablet    Sig: Take 1 tablet (10 mg total) by mouth daily.    Dispense:  90 tablet    Refill:  1  . omega-3 acid ethyl esters (LOVAZA) 1 g capsule    Sig: TAKE (2) CAPSULES BY MOUTH TWICE DAILY.    Dispense:  360 capsule    Refill:  1     Follow-up: Return in about 6 months (around 04/12/2020).  Scarlette Calico, MD

## 2019-10-14 NOTE — Patient Instructions (Signed)
Health Maintenance, Female Adopting a healthy lifestyle and getting preventive care are important in promoting health and wellness. Ask your health care provider about:  The right schedule for you to have regular tests and exams.  Things you can do on your own to prevent diseases and keep yourself healthy. What should I know about diet, weight, and exercise? Eat a healthy diet   Eat a diet that includes plenty of vegetables, fruits, low-fat dairy products, and lean protein.  Do not eat a lot of foods that are high in solid fats, added sugars, or sodium. Maintain a healthy weight Body mass index (BMI) is used to identify weight problems. It estimates body fat based on height and weight. Your health care provider can help determine your BMI and help you achieve or maintain a healthy weight. Get regular exercise Get regular exercise. This is one of the most important things you can do for your health. Most adults should:  Exercise for at least 150 minutes each week. The exercise should increase your heart rate and make you sweat (moderate-intensity exercise).  Do strengthening exercises at least twice a week. This is in addition to the moderate-intensity exercise.  Spend less time sitting. Even light physical activity can be beneficial. Watch cholesterol and blood lipids Have your blood tested for lipids and cholesterol at 75 years of age, then have this test every 5 years. Have your cholesterol levels checked more often if:  Your lipid or cholesterol levels are high.  You are older than 75 years of age.  You are at high risk for heart disease. What should I know about cancer screening? Depending on your health history and family history, you may need to have cancer screening at various ages. This may include screening for:  Breast cancer.  Cervical cancer.  Colorectal cancer.  Skin cancer.  Lung cancer. What should I know about heart disease, diabetes, and high blood  pressure? Blood pressure and heart disease  High blood pressure causes heart disease and increases the risk of stroke. This is more likely to develop in people who have high blood pressure readings, are of African descent, or are overweight.  Have your blood pressure checked: ? Every 3-5 years if you are 18-39 years of age. ? Every year if you are 40 years old or older. Diabetes Have regular diabetes screenings. This checks your fasting blood sugar level. Have the screening done:  Once every three years after age 40 if you are at a normal weight and have a low risk for diabetes.  More often and at a younger age if you are overweight or have a high risk for diabetes. What should I know about preventing infection? Hepatitis B If you have a higher risk for hepatitis B, you should be screened for this virus. Talk with your health care provider to find out if you are at risk for hepatitis B infection. Hepatitis C Testing is recommended for:  Everyone born from 1945 through 1965.  Anyone with known risk factors for hepatitis C. Sexually transmitted infections (STIs)  Get screened for STIs, including gonorrhea and chlamydia, if: ? You are sexually active and are younger than 75 years of age. ? You are older than 75 years of age and your health care provider tells you that you are at risk for this type of infection. ? Your sexual activity has changed since you were last screened, and you are at increased risk for chlamydia or gonorrhea. Ask your health care provider if   you are at risk.  Ask your health care provider about whether you are at high risk for HIV. Your health care provider may recommend a prescription medicine to help prevent HIV infection. If you choose to take medicine to prevent HIV, you should first get tested for HIV. You should then be tested every 3 months for as long as you are taking the medicine. Pregnancy  If you are about to stop having your period (premenopausal) and  you may become pregnant, seek counseling before you get pregnant.  Take 400 to 800 micrograms (mcg) of folic acid every day if you become pregnant.  Ask for birth control (contraception) if you want to prevent pregnancy. Osteoporosis and menopause Osteoporosis is a disease in which the bones lose minerals and strength with aging. This can result in bone fractures. If you are 65 years old or older, or if you are at risk for osteoporosis and fractures, ask your health care provider if you should:  Be screened for bone loss.  Take a calcium or vitamin D supplement to lower your risk of fractures.  Be given hormone replacement therapy (HRT) to treat symptoms of menopause. Follow these instructions at home: Lifestyle  Do not use any products that contain nicotine or tobacco, such as cigarettes, e-cigarettes, and chewing tobacco. If you need help quitting, ask your health care provider.  Do not use street drugs.  Do not share needles.  Ask your health care provider for help if you need support or information about quitting drugs. Alcohol use  Do not drink alcohol if: ? Your health care provider tells you not to drink. ? You are pregnant, may be pregnant, or are planning to become pregnant.  If you drink alcohol: ? Limit how much you use to 0-1 drink a day. ? Limit intake if you are breastfeeding.  Be aware of how much alcohol is in your drink. In the [place] one drink equals one 12 oz bottle of beer (355 mL), one 5 oz glass of wine (148 mL), or one 1 oz glass of hard liquor (44 mL). General instructions  Schedule regular health, dental, and eye exams.  Stay current with your vaccines.  Tell your health care provider if: ? You often feel depressed. ? You have ever been abused or do not feel safe at home. Summary  Adopting a healthy lifestyle and getting preventive care are important in promoting health and wellness.  Follow your health care provider's instructions about healthy  diet, exercising, and getting tested or screened for diseases.  Follow your health care provider's instructions on monitoring your cholesterol and blood pressure. This information is not intended to replace advice given to you by your health care provider. Make sure you discuss any questions you have with your health care provider. Document Revised: 08/27/2018 Document Reviewed: 08/27/2018 Elsevier Patient Education  2020 [employer]  

## 2019-10-15 ENCOUNTER — Encounter: Payer: Self-pay | Admitting: Internal Medicine

## 2019-10-15 DIAGNOSIS — E785 Hyperlipidemia, unspecified: Secondary | ICD-10-CM | POA: Insufficient documentation

## 2019-10-15 LAB — THYROID PANEL WITH TSH
Free Thyroxine Index: 1.3 — ABNORMAL LOW (ref 1.4–3.8)
T3 Uptake: 31 % (ref 22–35)
T4, Total: 4.3 ug/dL — ABNORMAL LOW (ref 5.1–11.9)
TSH: 2.69 mIU/L (ref 0.40–4.50)

## 2019-10-15 LAB — HEPATITIS C ANTIBODY
Hepatitis C Ab: NONREACTIVE
SIGNAL TO CUT-OFF: 0.01 (ref <1.00)

## 2019-10-15 LAB — PTH, INTACT AND CALCIUM
Calcium: 9.7 mg/dL (ref 8.6–10.4)
PTH: 48 pg/mL (ref 14–64)

## 2019-10-15 MED ORDER — OMEGA-3-ACID ETHYL ESTERS 1 G PO CAPS: ORAL_CAPSULE | ORAL | 1 refills | Status: DC

## 2019-10-15 MED ORDER — ROSUVASTATIN CALCIUM 10 MG PO TABS: 10.0000 mg | ORAL_TABLET | Freq: Every day | ORAL | 1 refills | Status: DC

## 2019-10-16 ENCOUNTER — Other Ambulatory Visit: Payer: Self-pay | Admitting: Internal Medicine

## 2019-10-16 DIAGNOSIS — E038 Other specified hypothyroidism: Secondary | ICD-10-CM

## 2019-10-28 ENCOUNTER — Ambulatory Visit: Payer: Medicare Other | Attending: Internal Medicine

## 2019-10-28 ENCOUNTER — Telehealth: Payer: Self-pay | Admitting: Internal Medicine

## 2019-10-28 DIAGNOSIS — I10 Essential (primary) hypertension: Secondary | ICD-10-CM

## 2019-10-28 NOTE — Addendum Note (Signed)
Addended by: Karle Barr on: 10/28/2019 04:14 PM   Modules accepted: Orders

## 2019-10-28 NOTE — Chronic Care Management (AMB) (Signed)
  Chronic Care Management   Note  10/28/2019 Name: Leslie Edwards MRN: [id number] DOB: [birth year]  Leslie Edwards is a 74 y.o. year old female who is a primary care patient of Janith Lima, MD. I reached out to [name] by phone today in response to a referral sent by Leslie Edwards's PCP, Janith Lima, MD.   Leslie Edwards was given information about Chronic Care Management services today including:  1. CCM service includes personalized support from designated clinical staff supervised by her physician, including individualized plan of care and coordination with other care providers 2. 24/7 contact phone numbers for assistance for urgent and routine care needs. 3. Service will only be billed when office clinical staff spend 20 minutes or more in a month to coordinate care. 4. Only one practitioner may furnish and bill the service in a calendar month. 5. The patient may stop CCM services at any time (effective at the end of the month) by phone call to the office staff. 6. The patient will be responsible for cost sharing (co-pay) of up to 20% of the service fee (after annual deductible is met).  Patient agreed to services and verbal consent obtained.   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

## 2019-10-28 NOTE — Progress Notes (Signed)
   Covid-19 Vaccination Clinic  Name:  Leslie Edwards    MRN: [id number] DOB: June 02, 1946  10/28/2019  [name] was observed post Covid-19 immunization for 30 minutes based on pre-vaccination screening without incidence. She was provided with Vaccine Information Sheet and instruction to access the V-Safe system.   [name] was instructed to call 911 with any severe reactions post vaccine: Marland Kitchen Difficulty breathing  . Swelling of your face and throat  . A fast heartbeat  . A bad rash all over your body  . Dizziness and weakness

## 2019-10-30 ENCOUNTER — Ambulatory Visit: Payer: Medicare Other | Admitting: Pharmacist

## 2019-10-30 ENCOUNTER — Other Ambulatory Visit: Payer: Self-pay

## 2019-10-30 DIAGNOSIS — E038 Other specified hypothyroidism: Secondary | ICD-10-CM

## 2019-10-30 DIAGNOSIS — F418 Other specified anxiety disorders: Secondary | ICD-10-CM

## 2019-10-30 DIAGNOSIS — N184 Chronic kidney disease, stage 4 (severe): Secondary | ICD-10-CM

## 2019-10-30 DIAGNOSIS — I5032 Chronic diastolic (congestive) heart failure: Secondary | ICD-10-CM

## 2019-10-30 DIAGNOSIS — M1A39X0 Chronic gout due to renal impairment, multiple sites, without tophus (tophi): Secondary | ICD-10-CM

## 2019-10-30 DIAGNOSIS — E785 Hyperlipidemia, unspecified: Secondary | ICD-10-CM

## 2019-10-30 DIAGNOSIS — J4520 Mild intermittent asthma, uncomplicated: Secondary | ICD-10-CM

## 2019-10-30 DIAGNOSIS — I10 Essential (primary) hypertension: Secondary | ICD-10-CM

## 2019-10-30 DIAGNOSIS — M170 Bilateral primary osteoarthritis of knee: Secondary | ICD-10-CM

## 2019-10-30 NOTE — Chronic Care Management (AMB) (Signed)
Chronic Care Management Pharmacy  Name: Leslie Edwards  MRN: [id number] DOB: 1946-01-30   Chief Complaint/ HPI  Leslie Edwards,  74 y.o. , female presents for their Initial CCM visit with the clinical pharmacist via telephone.  PCP : Leslie Lima, MD  Their chronic conditions include: diastolic HF, HTN, HLD, CKDIV, asthma, gout, hypothyroidism, depression/anxiety  Office Visits: 10/14/19 Dr Leslie Edwards OV: pt stopped metolazone. Started rosuvastatin 10 mg daily due to ASCVD risk.  Pt is worried about side effects of statin.  06/22/19 Dr Leslie Edwards: 4-day rash on neck. Dx'd poison ivy ontact dermatitis and eczema, rx'd levocetirizine x 7 days, fluocinonide cream, and depo-medrol IM.  Consult Visit: 05/20/19: Leslie Edwards eye exam.  Medications: Outpatient Encounter Medications as of 10/30/2019  Medication Sig  . albuterol (PROVENTIL HFA;VENTOLIN HFA) 108 (90 Base) MCG/ACT inhaler Inhale 2 puffs into the lungs every 6 (six) hours as needed.  Marland Kitchen allopurinol (ZYLOPRIM) 100 MG tablet Take 1 tablet (100 mg total) by mouth daily.  . budesonide-formoterol (SYMBICORT) 160-4.5 MCG/ACT inhaler Inhale 2 puffs into the lungs 2 (two) times daily.  Marland Kitchen buPROPion (WELLBUTRIN XL) 150 MG 24 hr tablet TAKE 2 TABLETS IN THE MORNING AND 1 TABLET IN THE EVENING.  . Colchicine (MITIGARE) 0.6 MG CAPS Take 1 tablet by mouth 2 (two) times daily.  . hyoscyamine (LEVSIN SL) 0.125 MG SL tablet Place 1 tablet (0.125 mg total) under the tongue every 4 (four) hours as needed.  Marland Kitchen ipratropium-albuterol (DUONEB) 0.5-2.5 (3) MG/3ML SOLN Take 3 mLs by nebulization as needed.  Marland Kitchen levocetirizine (XYZAL) 5 MG tablet Take 2 tablets (10 mg total) by mouth every evening for 7 days.  Marland Kitchen liothyronine (CYTOMEL) 25 MCG tablet TAKE 1 TABLET ONCE DAILY.  Marland Kitchen liothyronine (CYTOMEL) 5 MCG tablet TAKE 1 TABLET ONCE DAILY.  Marland Kitchen omega-3 acid ethyl esters (LOVAZA) 1 g capsule TAKE (2) CAPSULES BY MOUTH TWICE DAILY.  Marland Kitchen oxyCODONE (OXY IR/ROXICODONE) 5 MG  immediate release tablet Take 1 tablet (5 mg total) by mouth every 6 (six) hours as needed for severe pain.  . potassium chloride (KLOR-CON) 10 MEQ tablet TAKE 1 TABLET BY MOUTH DAILY.  . rosuvastatin (CRESTOR) 10 MG tablet Take 1 tablet (10 mg total) by mouth daily.  Marland Kitchen SYNTHROID 25 MCG tablet TAKE 1 TABLET ONCE DAILY.  Marland Kitchen torsemide (DEMADEX) 20 MG tablet TAKE 1 TABLET ONCE DAILY.  Marland Kitchen VIIBRYD 40 MG TABS TAKE 1 TABLET ONCE DAILY.   No facility-administered encounter medications on file as of 10/30/2019.    Current Diagnosis/Assessment:  Goals Addressed            This Visit's Progress   . Pharmacy Care Plan       Current Barriers:  . Chronic Disease Management support, education, and care coordination needs related to CHF, HTN, HLD, and CKD, asthma, gout, hypothyroidism, depression  Pharmacist Clinical Goal(s):  . Decrease risk for heart disease by: o Taking rosuvastatin daily o Monitoring BP at home to ensure BP < 140/80 . Improve medication affordability . Improve reflux symptoms . Improve sleep  Interventions: . Comprehensive medication review performed. . Discussed benefits and side effects of statins . Apply for patient assistance for Viibryd . Take Pepcid (famotidine) at first sign of reflux or 30 min before meal . Move PM bupropion dose to late afternoon  Patient Self Care Activities:  . Patient verbalizes understanding of plan to contine current medications, Self administers medications as prescribed, Calls pharmacy for medication refills, and Calls provider office for  new concerns or questions  Initial goal documentation        Asthma   Tobacco Status:  Social History   Tobacco Use  Smoking Status Former Smoker  . Quit date: 01/22/1975  . Years since quitting: 44.8  Smokeless Tobacco Never Used    Patient has failed these meds in past: montelukast Patient is currently controlled on the following medications: albuterol PRN  Using maintenance inhaler  regularly? No Frequency of rescue inhaler use:  infrequently  We discussed: pt uses rescue inhaler less than once a month, symptoms are well controlled without intervention.  Plan  Continue current medications   Hypertension/diastolic HF   Last ejection fraction: 60-65% (11/27/18)  Office blood pressures are  BP Readings from Last 3 Encounters:  10/14/19 136/78  06/22/19 (!) 150/70  04/13/19 (!) 148/88   Lab Results  Component Value Date   CREATININE 1.35 (H) 10/14/2019   CREATININE 1.86 (H) 03/16/2019   CREATININE 1.38 (H) 10/30/2018   Patient has failed these meds in the past: furosemide, losartan, metolazone, telmisartan Patient is currently controlled on the following medications: torsemide 20 mg daily, potassium 10 meq daily  Patient checks BP at home infrequently  Patient home BP readings are ranging: 120s-130s/70s-80s Pt denies issues with swelling currently.  We discussed how blood pressure is related to heart disease. Discussed benefits of monitoring BP at home. Pt does have a cuff, she is not sure it is very accurate for her since it is loose and falls off. Went over proper technique for checking BP.   Plan  Continue current medications  Check BP once a week  Hyperlipidemia   Lipid Panel     Component Value Date/Time   CHOL 215 (H) 10/14/2019 1155   TRIG 280.0 (H) 10/14/2019 1155   HDL 71.80 10/14/2019 1155   CHOLHDL 3 10/14/2019 1155   VLDL 56.0 (H) 10/14/2019 1155   LDLDIRECT 107.0 10/14/2019 1155    ASCVD 10-year risk: 21%  Patient has failed these meds in past: n/a Patient is currently controlled on the following medications: rosuvastatin 10 mg daily  We discussed: benefits and risks associated with statins, answered all questions. Pt also takes Co-Q-10 to prevent myalgias, she researched on the internet how to manage statin side effects. Pt does endorse compliance with rosuvastatin since starting a few weeks ago, denies any issues so  far.  Plan  Continue current medications and control with diet and exercise   Gout   Patient is currently controlled on the following medications: allopurinol 100 mg daily. She has not had to use colchicine since first attack.  We discussed: how allopurinol can prevent gout attacks.  Plan  Continue current medications   Hypothyroidism   TSH  Date Value Ref Range Status  10/14/2019 2.69 0.40 - 4.50 mIU/L Final     Patient has failed these meds in past: [place] thyroid Patient is currently controlled on the following medications: liothyronine 25 mcg daily, liothyronine 5 mcg daily, Synthroid 25 mcg daily  We discussed: proper administration of thyroid meds. Pt reports Synthroid is about $50/month.   Plan  Continue current medications   Depression/anxiety   Patient has failed these meds in past: alprazolam Patient is currently controlled on the following medications: Viibryd 40 mg daily, bupropion XL 300 mg qAM and 150 mg qPM  We discussed: pt believes this regimen is working well for her, she has needed help with depression since her son died at 13. She is having some issues staying asleep at  night, discussed that bupropion can be activating so moving PM dose to earlier in the day may help.  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 This causes her a lot of stress, she does have some family locally who help occasionally.  Plan  Continue current medications  Move PM dose of bupropion to late afternoon   GERD/Celiac disease   Patient has failed these meds in past: Amitiza, omeprazole Patient is currently controlled on the following medications: hyoscyamine 0.125 mg SL q4h PRN  We discussed:  Pt uses hyosycamine to help with swallowing issues, she needs it about once a month.  Plan  Continue current medications   Osteoarthritis/back and neck pain   Patient has failed these meds in past: hydrocodone,  meloxicam, naproxen Patient is currently controlled on the following medications: oxycodone 5 mg q6h prn  We discussed: pt is very worried about opioid use, she will only take it if she is in very severe pain. Takes 1 tablet about every other week or so. Discussed using opioids sparingly is wise, but it is ok to take more often if she is in pain.   Plan  Continue current medications   Medication Management   Pt uses [facility] for all medications Pt uses 7-day pill box that she sets up each week Pt endorses 100% compliance Pt would prefer 90 day supplies for all meds, cost may be prohibitive  We discussed: Most significant issue is cost, Viibryd is $121/month, Synthroid is $50/month, bupropion is $100/3 months. Patient assistance is available for some brand drugs, pt would like to pursue whatever is available.   Plan  Continue current medications  Patient Assistance for Viibryd    Follow up:  1 week for patient assistance 6 months phone visit   Charlene Brooke, PharmD Clinical Pharmacist [facility] [phone]

## 2019-10-30 NOTE — Patient Instructions (Addendum)
Visit Information  Thank you for meeting with me to discuss your medications! I look forward to working with you to achieve your health care goals. Below is a summary of what we talked about during the visit, plus some guidance about food choices to help with reflux.  Goals Addressed            This Visit's Progress   . Pharmacy Care Plan       Current Barriers:  . Chronic Disease Management support, education, and care coordination needs related to CHF, HTN, HLD, and CKD, asthma, gout, hypothyroidism, depression  Pharmacist Clinical Goal(s):  . Decrease risk for heart disease by: o Taking rosuvastatin daily o Monitoring BP at home to ensure BP < 140/80 . Improve medication affordability . Improve reflux symptoms . Improve sleep  Interventions: . Comprehensive medication review performed. . Discussed benefits and side effects of statins . Apply for patient assistance for Viibryd . Take Pepcid (famotidine) at first sign of reflux or 30 min before meal . Move PM bupropion dose to late afternoon  Patient Self Care Activities:  . Patient verbalizes understanding of plan to contine current medications, Self administers medications as prescribed, Calls pharmacy for medication refills, and Calls provider office for new concerns or questions  Initial goal documentation      Ms. Schirm was given information about Chronic Care Management services today including:  1. CCM service includes personalized support from designated clinical staff supervised by her physician, including individualized plan of care and coordination with other care providers 2. 24/7 contact phone numbers for assistance for urgent and routine care needs. 3. Service will only be billed when office clinical staff spend 20 minutes or more in a month to coordinate care. 4. Only one practitioner may furnish and bill the service in a calendar month. 5. The patient may stop CCM services at any time (effective at the end of  the month) by phone call to the office staff. 6. The patient will be responsible for cost sharing (co-pay) of up to 20% of the service fee (after annual deductible is met).  Patient agreed to services and verbal consent obtained.   Print copy of patient instructions provided.   The pharmacy team will reach out over the next 7 days to discuss patient assistance. Telephone follow up appointment with pharmacy team member scheduled for: 05/02/20 @ 1pm   Charlene Brooke, PharmD Clinical Pharmacist [facility] [phone]   Food Choices for Gastroesophageal Reflux Disease, Adult When you have gastroesophageal reflux disease (GERD), the foods you eat and your eating habits are very important. Choosing the right foods can help ease your discomfort. Think about working with a nutrition specialist (dietitian) to help you make good choices. What are tips for following this plan?  Meals  Choose healthy foods that are low in fat, such as fruits, vegetables, whole grains, low-fat dairy products, and lean meat, fish, and poultry.  Eat small meals often instead of 3 large meals a day. Eat your meals slowly, and in a place where you are relaxed. Avoid bending over or lying down until 2-3 hours after eating.  Avoid eating meals 2-3 hours before bed.  Avoid drinking a lot of liquid with meals.  Cook foods using methods other than frying. Bake, grill, or broil food instead.  Avoid or limit: ? Chocolate. ? Peppermint or spearmint. ? Alcohol. ? Pepper. ? Black and decaffeinated coffee. ? Black and decaffeinated tea. ? Bubbly (carbonated) soft drinks. ?  Caffeinated energy drinks and soft drinks.  Limit high-fat foods such as: ? Fatty meat or fried foods. ? Whole milk, cream, butter, or ice cream. ? Nuts and nut butters. ? Pastries, donuts, and sweets made with butter or shortening.  Avoid foods that cause symptoms. These foods may be different for everyone. Common  foods that cause symptoms include: ? Tomatoes. ? Oranges, lemons, and limes. ? Peppers. ? Spicy food. ? Onions and garlic. ? Vinegar. Lifestyle  Maintain a healthy weight. Ask your doctor what weight is healthy for you. If you need to lose weight, work with your doctor to do so safely.  Exercise for at least 30 minutes for 5 or more days each week, or as told by your doctor.  Wear loose-fitting clothes.  Do not smoke. If you need help quitting, ask your doctor.  Sleep with the head of your bed higher than your feet. Use a wedge under the mattress or blocks under the bed frame to raise the head of the bed. Summary  When you have gastroesophageal reflux disease (GERD), food and lifestyle choices are very important in easing your symptoms.  Eat small meals often instead of 3 large meals a day. Eat your meals slowly, and in a place where you are relaxed.  Limit high-fat foods such as fatty meat or fried foods.  Avoid bending over or lying down until 2-3 hours after eating.  Avoid peppermint and spearmint, caffeine, alcohol, and chocolate. This information is not intended to replace advice given to you by your health care provider. Make sure you discuss any questions you have with your health care provider. Document Revised: 12/25/2018 Document Reviewed: 10/09/2016 Elsevier Patient Education  [place].

## 2019-11-03 ENCOUNTER — Other Ambulatory Visit: Payer: Self-pay | Admitting: Internal Medicine

## 2019-11-03 DIAGNOSIS — M1A39X0 Chronic gout due to renal impairment, multiple sites, without tophus (tophi): Secondary | ICD-10-CM

## 2019-11-17 ENCOUNTER — Other Ambulatory Visit: Payer: Self-pay | Admitting: Internal Medicine

## 2019-11-17 DIAGNOSIS — F418 Other specified anxiety disorders: Secondary | ICD-10-CM

## 2019-11-17 DIAGNOSIS — I10 Essential (primary) hypertension: Secondary | ICD-10-CM

## 2020-01-21 ENCOUNTER — Telehealth: Payer: Self-pay

## 2020-01-21 NOTE — Telephone Encounter (Signed)
PA request for name brand Synthroid 25 mcg tablet.   Last rx'ed on 10/16/2019.   LVM for pt to call back.  RE: would like to know if patient prefers the name brand medication.

## 2020-02-08 NOTE — Telephone Encounter (Signed)
Printed and completed [employer] patient assistance for Synthroid. Mailed application to patient for her to complete patient portion. Faxed MD portion to assistance program.

## 2020-03-03 DIAGNOSIS — Z0120 Encounter for dental examination and cleaning without abnormal findings: Secondary | ICD-10-CM | POA: Diagnosis not present

## 2020-03-03 DIAGNOSIS — N183 Chronic kidney disease, stage 3 unspecified: Secondary | ICD-10-CM | POA: Diagnosis not present

## 2020-03-08 DIAGNOSIS — N183 Chronic kidney disease, stage 3 unspecified: Secondary | ICD-10-CM | POA: Diagnosis not present

## 2020-03-08 DIAGNOSIS — R609 Edema, unspecified: Secondary | ICD-10-CM | POA: Diagnosis not present

## 2020-04-11 ENCOUNTER — Other Ambulatory Visit: Payer: Self-pay | Admitting: Internal Medicine

## 2020-04-11 DIAGNOSIS — E038 Other specified hypothyroidism: Secondary | ICD-10-CM

## 2020-04-12 ENCOUNTER — Ambulatory Visit (INDEPENDENT_AMBULATORY_CARE_PROVIDER_SITE_OTHER): Payer: Medicare Other | Admitting: Internal Medicine

## 2020-04-12 ENCOUNTER — Other Ambulatory Visit: Payer: Self-pay

## 2020-04-12 ENCOUNTER — Encounter: Payer: Self-pay | Admitting: Internal Medicine

## 2020-04-12 VITALS — BP 138/80 | HR 69 | Temp 98.3°F | Resp 16 | Ht 64.0 in | Wt 208.0 lb

## 2020-04-12 DIAGNOSIS — I10 Essential (primary) hypertension: Secondary | ICD-10-CM

## 2020-04-12 DIAGNOSIS — E038 Other specified hypothyroidism: Secondary | ICD-10-CM

## 2020-04-12 DIAGNOSIS — M1A39X0 Chronic gout due to renal impairment, multiple sites, without tophus (tophi): Secondary | ICD-10-CM

## 2020-04-12 DIAGNOSIS — E213 Hyperparathyroidism, unspecified: Secondary | ICD-10-CM

## 2020-04-12 DIAGNOSIS — N184 Chronic kidney disease, stage 4 (severe): Secondary | ICD-10-CM | POA: Diagnosis not present

## 2020-04-12 NOTE — Patient Instructions (Signed)
Hypothyroidism  Hypothyroidism is when the thyroid gland does not make enough of certain hormones (it is underactive). The thyroid gland is a small gland located in the lower front part of the neck, just in front of the windpipe (trachea). This gland makes hormones that help control how the body uses food for energy (metabolism) as well as how the heart and brain function. These hormones also play a role in keeping your bones strong. When the thyroid is underactive, it produces too little of the hormones thyroxine (T4) and triiodothyronine (T3). What are the causes? This condition may be caused by:  Hashimoto's disease. This is a disease in which the body's disease-fighting system (immune system) attacks the thyroid gland. This is the most common cause.  Viral infections.  Pregnancy.  Certain medicines.  Birth defects.  Past radiation treatments to the head or neck for cancer.  Past treatment with radioactive iodine.  Past exposure to radiation in the environment.  Past surgical removal of part or all of the thyroid.  Problems with a gland in the center of the brain (pituitary gland).  Lack of enough iodine in the diet. What increases the risk? You are more likely to develop this condition if:  You are female.  You have a family history of thyroid conditions.  You use a medicine called lithium.  You take medicines that affect the immune system (immunosuppressants). What are the signs or symptoms? Symptoms of this condition include:  Feeling as though you have no energy (lethargy).  Not being able to tolerate cold.  Weight gain that is not explained by a change in diet or exercise habits.  Lack of appetite.  Dry skin.  Coarse hair.  Menstrual irregularity.  Slowing of thought processes.  Constipation.  Sadness or depression. How is this diagnosed? This condition may be diagnosed based on:  Your symptoms, your medical history, and a physical exam.  Blood  tests. You may also have imaging tests, such as an ultrasound or MRI. How is this treated? This condition is treated with medicine that replaces the thyroid hormones that your body does not make. After you begin treatment, it may take several weeks for symptoms to go away. Follow these instructions at home:  Take over-the-counter and prescription medicines only as told by your health care provider.  If you start taking any new medicines, tell your health care provider.  Keep all follow-up visits as told by your health care provider. This is important. ? As your condition improves, your dosage of thyroid hormone medicine may change. ? You will need to have blood tests regularly so that your health care provider can monitor your condition. Contact a health care provider if:  Your symptoms do not get better with treatment.  You are taking thyroid replacement medicine and you: ? Sweat a lot. ? Have tremors. ? Feel anxious. ? Lose weight rapidly. ? Cannot tolerate heat. ? Have emotional swings. ? Have diarrhea. ? Feel weak. Get help right away if you have:  Chest pain.  An irregular heartbeat.  A rapid heartbeat.  Difficulty breathing. Summary  Hypothyroidism is when the thyroid gland does not make enough of certain hormones (it is underactive).  When the thyroid is underactive, it produces too little of the hormones thyroxine (T4) and triiodothyronine (T3).  The most common cause is Hashimoto's disease, a disease in which the body's disease-fighting system (immune system) attacks the thyroid gland. The condition can also be caused by viral infections, medicine, pregnancy, or past   radiation treatment to the head or neck.  Symptoms may include weight gain, dry skin, constipation, feeling as though you do not have energy, and not being able to tolerate cold.  This condition is treated with medicine to replace the thyroid hormones that your body does not make. This information  is not intended to replace advice given to you by your health care provider. Make sure you discuss any questions you have with your health care provider. Document Revised: 08/16/2017 Document Reviewed: 08/14/2017 Elsevier Patient Education  2020 [employer]  

## 2020-04-12 NOTE — Progress Notes (Signed)
Subjective:  Patient ID: Leslie Edwards, female    DOB: 15-Feb-1946  Age: 74 y.o. MRN: [id number]  CC: Hypothyroidism and Hypertension  This visit occurred during the SARS-CoV-2 public health emergency.  Safety protocols were in place, including screening questions prior to the visit, additional usage of staff PPE, and extensive cleaning of exam room while observing appropriate contact time as indicated for disinfecting solutions.    HPI [name] presents for f/up - She complains of weight gain and other chronic unchanged symptoms including joint pains, fatigue, constipation, and insomnia.  She is currently taking 50 mcg of T4 and 30 mcg of T3.  She is controlling her pain with oxycodone.  Outpatient Medications Prior to Visit  Medication Sig Dispense Refill  . albuterol (PROVENTIL HFA;VENTOLIN HFA) 108 (90 Base) MCG/ACT inhaler Inhale 2 puffs into the lungs every 6 (six) hours as needed. 18 g 11  . allopurinol (ZYLOPRIM) 100 MG tablet TAKE 1 TABLET ONCE DAILY. 90 tablet 1  . Ascorbic Acid (VITAMIN C) 1000 MG tablet Take 1,000 mg by mouth daily.    Marland Kitchen b complex vitamins tablet Take 1 tablet by mouth daily.    . budesonide-formoterol (SYMBICORT) 160-4.5 MCG/ACT inhaler Inhale 2 puffs into the lungs 2 (two) times daily. 1 Inhaler 11  . buPROPion (WELLBUTRIN XL) 150 MG 24 hr tablet TAKE 2 TABLETS IN THE MORNING AND 1 TABLET IN THE EVENING. 270 tablet 0  . co-enzyme Q-10 30 MG capsule Take 30 mg by mouth 3 (three) times daily.    . Colchicine (MITIGARE) 0.6 MG CAPS Take 1 tablet by mouth 2 (two) times daily. 180 capsule 0  . famotidine (PEPCID) 10 MG tablet Take 10 mg by mouth 2 (two) times daily as needed for heartburn or indigestion.    . hyoscyamine (LEVSIN SL) 0.125 MG SL tablet Place 1 tablet (0.125 mg total) under the tongue every 4 (four) hours as needed. 30 tablet 11  . ipratropium-albuterol (DUONEB) 0.5-2.5 (3) MG/3ML SOLN Take 3 mLs by nebulization as needed. 360 mL 1  .  liothyronine (CYTOMEL) 25 MCG tablet TAKE 1 TABLET ONCE DAILY. 90 tablet 0  . liothyronine (CYTOMEL) 5 MCG tablet TAKE 1 TABLET ONCE DAILY. 90 tablet 0  . Multiple Vitamin (MULTIVITAMIN) tablet Take 1 tablet by mouth daily.    Marland Kitchen omega-3 acid ethyl esters (LOVAZA) 1 g capsule TAKE (2) CAPSULES BY MOUTH TWICE DAILY. 360 capsule 1  . oxyCODONE (OXY IR/ROXICODONE) 5 MG immediate release tablet Take 1 tablet (5 mg total) by mouth every 6 (six) hours as needed for severe pain. 60 tablet 0  . potassium chloride (KLOR-CON) 10 MEQ tablet TAKE 1 TABLET BY MOUTH DAILY. 90 tablet 0  . rosuvastatin (CRESTOR) 10 MG tablet Take 1 tablet (10 mg total) by mouth daily. 90 tablet 1  . torsemide (DEMADEX) 20 MG tablet TAKE 1 TABLET ONCE DAILY. 90 tablet 1  . VIIBRYD 40 MG TABS TAKE 1 TABLET ONCE DAILY. 90 tablet 1  . SYNTHROID 25 MCG tablet TAKE 1 TABLET ONCE DAILY. 90 tablet 0  . levocetirizine (XYZAL) 5 MG tablet Take 2 tablets (10 mg total) by mouth every evening for 7 days. 14 tablet 0   No facility-administered medications prior to visit.    ROS Review of Systems  Constitutional: Positive for fatigue and unexpected weight change. Negative for appetite change, chills and diaphoresis.  HENT: Negative.  Negative for trouble swallowing.   Eyes: Negative.   Respiratory: Negative for chest tightness,  shortness of breath and wheezing.   Cardiovascular: Negative for chest pain, palpitations and leg swelling.  Gastrointestinal: Negative for abdominal pain, constipation, diarrhea, nausea and vomiting.  Endocrine: Negative for cold intolerance and heat intolerance.  Genitourinary: Negative.  Negative for difficulty urinating.  Musculoskeletal: Positive for arthralgias. Negative for myalgias.  Skin: Negative.  Negative for color change and pallor.  Neurological: Negative.  Negative for dizziness, weakness, light-headedness and headaches.  Hematological: Negative for adenopathy. Does not bruise/bleed easily.    Psychiatric/Behavioral: Negative.     Objective:  BP (!) 138/80 (BP Location: Left Arm, Patient Position: Sitting, Cuff Size: Large)   Pulse 69   Temp 98.3 F (36.8 C) (Oral)   Resp 16   Ht 5\' 4"  (1.626 m)   Wt (!) 208 lb (94.3 kg)   SpO2 96%   BMI 35.70 kg/m   BP Readings from Last 3 Encounters:  04/12/20 (!) 138/80  10/14/19 136/78  06/22/19 (!) 150/70    Wt Readings from Last 3 Encounters:  04/12/20 (!) 208 lb (94.3 kg)  10/14/19 203 lb (92.1 kg)  06/22/19 192 lb 12 oz (87.4 kg)    Physical Exam Vitals reviewed.  Constitutional:      Appearance: She is obese.  HENT:     Nose: Nose normal.     Mouth/Throat:     Mouth: Mucous membranes are moist.  Eyes:     General: No scleral icterus.    Conjunctiva/sclera: Conjunctivae normal.  Cardiovascular:     Rate and Rhythm: Normal rate and regular rhythm.     Heart sounds: No murmur heard.   Pulmonary:     Effort: Pulmonary effort is normal.     Breath sounds: No stridor. No wheezing or rhonchi.  Abdominal:     General: Abdomen is protuberant. Bowel sounds are normal. There is no distension.     Palpations: Abdomen is soft. There is no hepatomegaly, splenomegaly or mass.     Tenderness: There is no abdominal tenderness.  Musculoskeletal:        General: Normal range of motion.     Cervical back: Neck supple.     Right lower leg: No edema.     Left lower leg: No edema.  Lymphadenopathy:     Cervical: No cervical adenopathy.  Skin:    General: Skin is warm and dry.  Neurological:     General: No focal deficit present.     Mental Status: She is alert. Mental status is at baseline.  Psychiatric:        Mood and Affect: Mood normal.        Behavior: Behavior normal.     Lab Results  Component Value Date   WBC 6.5 10/14/2019   HGB 13.3 10/14/2019   HCT 39.3 10/14/2019   PLT 293.0 10/14/2019   GLUCOSE 100 (H) 04/12/2020   CHOL 215 (H) 10/14/2019   TRIG 280.0 (H) 10/14/2019   HDL 71.80 10/14/2019    LDLDIRECT 107.0 10/14/2019   LDLCALC 90 10/30/2018   ALT 17 10/30/2018   AST 18 10/30/2018   NA 141 04/12/2020   K 4.6 04/12/2020   CL 106 04/12/2020   CREATININE 1.36 (H) 04/12/2020   BUN 25 04/12/2020   CO2 26 04/12/2020   TSH 0.60 04/12/2020   HGBA1C 5.3 04/28/2009    MM CLIP PLACEMENT RIGHT  Result Date: 06/18/2019 CLINICAL DATA:  Patient underwent biopsy of right breast calcifications. EXAM: DIAGNOSTIC RIGHT MAMMOGRAM POST STEREOTACTIC BIOPSY COMPARISON:  Previous exam(s). FINDINGS:  Mammographic images were obtained following stereotactic guided biopsy of calcifications in the right breast at 12 o'clock. There is placement of a coil shaped clip at the biopsy site which is located approximately 1.0 cm inferior to the residual calcifications on the MLO view. IMPRESSION: Postprocedure mammogram demonstrates placement of a biopsy clip located approximately 1 cm inferior to the residual calcifications that were biopsied in the right breast at 12 o'clock. Final Assessment: Post Procedure Mammograms for Marker Placement Electronically Signed   By: Audie Pinto M.D.   On: 06/18/2019 09:18   MM RT BREAST BX W LOC DEV 1ST LESION IMAGE BX SPEC STEREO GUIDE  Addendum Date: 06/19/2019   ADDENDUM REPORT: 06/19/2019 13:15 ADDENDUM: Pathology revealed SCLEROTIC FIBROADENOMATOID NODULE WITH CALCIFICATIONS of the RIGHT breast, 12 o'clock. This was found to be concordant by Dr. Audie Pinto. Pathology results were discussed with the patient by telephone. The patient reported doing well after the biopsy with tenderness at the site. Post biopsy instructions and care were reviewed and questions were answered. The patient was encouraged to call [facility] for any additional concerns. The patient was instructed to return for annual screening mammography and informed a reminder notice would be sent regarding this appointment. Pathology results reported by Stacie Acres, RN on  06/19/2019. Electronically Signed   By: Audie Pinto M.D.   On: 06/19/2019 13:15   Result Date: 06/19/2019 CLINICAL DATA:  Patient presents for biopsy of right breast calcifications. EXAM: RIGHT BREAST STEREOTACTIC CORE NEEDLE BIOPSY COMPARISON:  Previous exams. FINDINGS: The patient and I discussed the procedure of stereotactic-guided biopsy including benefits and alternatives. We discussed the high likelihood of a successful procedure. We discussed the risks of the procedure including infection, bleeding, tissue injury, clip migration, and inadequate sampling. Informed written consent was given. The usual time out protocol was performed immediately prior to the procedure. Using sterile technique and 1% Lidocaine as local anesthetic, under stereotactic guidance, a 9 gauge vacuum assisted device was used to perform core needle biopsy of calcifications in the right breast at 12 o'clock using a superior approach. Specimen radiograph was performed showing 2 of 6 specimens with calcifications. Specimens with calcifications are identified for pathology. Lesion quadrant: Upper outer quadrant At the conclusion of the procedure, coil tissue marker clip was deployed into the biopsy cavity. Follow-up 2-view mammogram was performed and dictated separately. IMPRESSION: Stereotactic-guided biopsy of right breast calcifications at 12 o'clock. No apparent complications. Electronically Signed: By: Audie Pinto M.D. On: 06/18/2019 09:14    Assessment & Plan:   Deaisha was seen today for hypothyroidism and hypertension.  Diagnoses and all orders for this visit:  Essential hypertension- Her blood pressure is adequately well controlled. -     BASIC METABOLIC PANEL WITH GFR; Future -     BASIC METABOLIC PANEL WITH GFR  Other specified hypothyroidism- Her TFTs are in the normal range.  Will continue current doses of T3 and T4. -     Thyroid Panel With TSH; Future -     Thyroid Panel With TSH -     levothyroxine  (SYNTHROID) 50 MCG tablet; Take 1 tablet (50 mcg total) by mouth daily before breakfast.  Parathyroid hormone excess ([place])- Her calcium and parathyroid hormone levels are normal now. -     PTH, intact and calcium; Future -     PTH, intact and calcium  Kidney disease, chronic, stage IV (GFR 15-29 ml/min) (HCC)- Her renal function has improved.  She agrees to avoid  nephrotoxic agents. -     BASIC METABOLIC PANEL WITH GFR; Future -     BASIC METABOLIC PANEL WITH GFR  Chronic gout due to renal impairment of multiple sites without tophus- She has achieved her uric acid goal. -     Uric acid; Future -     Uric acid   I have discontinued Rylah H. Crowson's Synthroid. I am also having her start on levothyroxine. Additionally, I am having her maintain her hyoscyamine, budesonide-formoterol, albuterol, ipratropium-albuterol, Colchicine, oxyCODONE, levocetirizine, rosuvastatin, omega-3 acid ethyl esters, vitamin C, co-enzyme Q-10, multivitamin, b complex vitamins, famotidine, allopurinol, Viibryd, torsemide, potassium chloride, buPROPion, liothyronine, and liothyronine.  Meds ordered this encounter  Medications  . levothyroxine (SYNTHROID) 50 MCG tablet    Sig: Take 1 tablet (50 mcg total) by mouth daily before breakfast.    Dispense:  90 tablet    Refill:  1     Follow-up: Return in about 6 months (around 10/13/2020).  Scarlette Calico, MD

## 2020-04-13 LAB — THYROID PANEL WITH TSH
Free Thyroxine Index: 1.1 — ABNORMAL LOW (ref 1.4–3.8)
T3 Uptake: 30 % (ref 22–35)
T4, Total: 3.7 ug/dL — ABNORMAL LOW (ref 5.1–11.9)
TSH: 0.6 mIU/L (ref 0.40–4.50)

## 2020-04-13 LAB — BASIC METABOLIC PANEL WITH GFR
BUN/Creatinine Ratio: 18 (calc) (ref 6–22)
BUN: 25 mg/dL (ref 7–25)
CO2: 26 mmol/L (ref 20–32)
Calcium: 9.9 mg/dL (ref 8.6–10.4)
Chloride: 106 mmol/L (ref 98–110)
Creat: 1.36 mg/dL — ABNORMAL HIGH (ref 0.60–0.93)
GFR, Est African American: 44 mL/min/{1.73_m2} — ABNORMAL LOW (ref >=60)
GFR, Est Non African American: 38 mL/min/{1.73_m2} — ABNORMAL LOW (ref >=60)
Glucose, Bld: 100 mg/dL — ABNORMAL HIGH (ref 65–99)
Potassium: 4.6 mmol/L (ref 3.5–5.3)
Sodium: 141 mmol/L (ref 135–146)

## 2020-04-13 LAB — PTH, INTACT AND CALCIUM
Calcium: 9.9 mg/dL (ref 8.6–10.4)
PTH: 51 pg/mL (ref 14–64)

## 2020-04-13 LAB — URIC ACID: Uric Acid, Serum: 6.5 mg/dL (ref 2.5–7.0)

## 2020-04-13 MED ORDER — LEVOTHYROXINE SODIUM 50 MCG PO TABS: 50.0000 ug | ORAL_TABLET | Freq: Every day | ORAL | 1 refills | Status: DC

## 2020-04-16 ENCOUNTER — Other Ambulatory Visit: Payer: Self-pay | Admitting: Internal Medicine

## 2020-04-16 DIAGNOSIS — M1A39X0 Chronic gout due to renal impairment, multiple sites, without tophus (tophi): Secondary | ICD-10-CM

## 2020-04-16 DIAGNOSIS — E785 Hyperlipidemia, unspecified: Secondary | ICD-10-CM

## 2020-04-25 ENCOUNTER — Other Ambulatory Visit: Payer: Self-pay | Admitting: Internal Medicine

## 2020-04-25 ENCOUNTER — Encounter: Payer: Self-pay | Admitting: Internal Medicine

## 2020-04-25 DIAGNOSIS — M48062 Spinal stenosis, lumbar region with neurogenic claudication: Secondary | ICD-10-CM

## 2020-04-25 MED ORDER — METHYLPREDNISOLONE 4 MG PO TBPK: ORAL_TABLET | ORAL | 0 refills | Status: AC

## 2020-04-27 ENCOUNTER — Encounter: Payer: Self-pay | Admitting: Internal Medicine

## 2020-04-27 ENCOUNTER — Other Ambulatory Visit: Payer: Self-pay | Admitting: Internal Medicine

## 2020-04-27 DIAGNOSIS — M48062 Spinal stenosis, lumbar region with neurogenic claudication: Secondary | ICD-10-CM

## 2020-04-27 DIAGNOSIS — M5412 Radiculopathy, cervical region: Secondary | ICD-10-CM

## 2020-04-27 DIAGNOSIS — M170 Bilateral primary osteoarthritis of knee: Secondary | ICD-10-CM

## 2020-04-27 MED ORDER — OXYCODONE HCL 5 MG PO TABS: 5.0000 mg | ORAL_TABLET | Freq: Four times a day (QID) | ORAL | 0 refills | Status: DC | PRN

## 2020-05-02 ENCOUNTER — Telehealth: Payer: Medicare Other

## 2020-05-30 ENCOUNTER — Other Ambulatory Visit: Payer: Self-pay | Admitting: Internal Medicine

## 2020-05-30 DIAGNOSIS — F418 Other specified anxiety disorders: Secondary | ICD-10-CM

## 2020-05-30 DIAGNOSIS — I10 Essential (primary) hypertension: Secondary | ICD-10-CM

## 2020-07-02 ENCOUNTER — Ambulatory Visit: Payer: Medicare Other | Attending: Internal Medicine

## 2020-07-02 DIAGNOSIS — Z23 Encounter for immunization: Secondary | ICD-10-CM

## 2020-07-02 NOTE — Progress Notes (Signed)
   Covid-19 Vaccination Clinic  Name:  Leslie Edwards    MRN: [id number] DOB: [birth year]  07/02/2020  Leslie Edwards was observed post Covid-19 immunization for 15 minutes without incident. She was provided with Vaccine Information Sheet and instruction to access the V-Safe system.   Leslie Edwards was instructed to call 911 with any severe reactions post vaccine: Marland Kitchen Difficulty breathing  . Swelling of face and throat  . A fast heartbeat  . A bad rash all over body  . Dizziness and weakness

## 2020-07-11 ENCOUNTER — Other Ambulatory Visit: Payer: Self-pay | Admitting: Internal Medicine

## 2020-07-11 DIAGNOSIS — E038 Other specified hypothyroidism: Secondary | ICD-10-CM

## 2020-07-20 ENCOUNTER — Other Ambulatory Visit: Payer: Self-pay | Admitting: Internal Medicine

## 2020-07-20 DIAGNOSIS — E038 Other specified hypothyroidism: Secondary | ICD-10-CM

## 2020-08-15 ENCOUNTER — Other Ambulatory Visit: Payer: Self-pay | Admitting: Internal Medicine

## 2020-08-15 ENCOUNTER — Telehealth: Payer: Self-pay | Admitting: Pharmacist

## 2020-08-15 DIAGNOSIS — M1A39X0 Chronic gout due to renal impairment, multiple sites, without tophus (tophi): Secondary | ICD-10-CM

## 2020-08-15 DIAGNOSIS — E785 Hyperlipidemia, unspecified: Secondary | ICD-10-CM

## 2020-08-15 NOTE — Progress Notes (Signed)
Chronic Care Management Pharmacy Assistant   Name: Leslie Edwards  MRN: [id number] DOB: Mar 19, 1946  Reason for Encounter: General Adherence Call  Patient Questions:  1.  Have you seen any other providers since your last visit? Yes, patient last seen Dr. Scarlette Calico on 04/12/20  2.  Any changes in your medicines or health? No    PCP : Janith Lima, MD  Allergies:   Allergies  Allergen Reactions  . Penicillins     REACTION: difficulty breathing  . Prochlorperazine Edisylate     Medications: Outpatient Encounter Medications as of 08/15/2020  Medication Sig  . albuterol (PROVENTIL HFA;VENTOLIN HFA) 108 (90 Base) MCG/ACT inhaler Inhale 2 puffs into the lungs every 6 (six) hours as needed.  Marland Kitchen allopurinol (ZYLOPRIM) 100 MG tablet TAKE 1 TABLET ONCE DAILY.  Marland Kitchen Ascorbic Acid (VITAMIN C) 1000 MG tablet Take 1,000 mg by mouth daily.  Marland Kitchen b complex vitamins tablet Take 1 tablet by mouth daily.  . budesonide-formoterol (SYMBICORT) 160-4.5 MCG/ACT inhaler Inhale 2 puffs into the lungs 2 (two) times daily.  Marland Kitchen buPROPion (WELLBUTRIN XL) 150 MG 24 hr tablet TAKE 2 TABLETS IN THE MORNING AND 1 TABLET IN THE EVENING.  Marland Kitchen co-enzyme Q-10 30 MG capsule Take 30 mg by mouth 3 (three) times daily.  . Colchicine (MITIGARE) 0.6 MG CAPS Take 1 tablet by mouth 2 (two) times daily.  . famotidine (PEPCID) 10 MG tablet Take 10 mg by mouth 2 (two) times daily as needed for heartburn or indigestion.  . hyoscyamine (LEVSIN SL) 0.125 MG SL tablet Place 1 tablet (0.125 mg total) under the tongue every 4 (four) hours as needed.  Marland Kitchen ipratropium-albuterol (DUONEB) 0.5-2.5 (3) MG/3ML SOLN Take 3 mLs by nebulization as needed.  Marland Kitchen levocetirizine (XYZAL) 5 MG tablet Take 2 tablets (10 mg total) by mouth every evening for 7 days.  Marland Kitchen levothyroxine (SYNTHROID) 50 MCG tablet Take 1 tablet (50 mcg total) by mouth daily before breakfast.  . liothyronine (CYTOMEL) 25 MCG tablet TAKE 1 TABLET ONCE DAILY.  Marland Kitchen liothyronine  (CYTOMEL) 5 MCG tablet TAKE 1 TABLET ONCE DAILY.  . Multiple Vitamin (MULTIVITAMIN) tablet Take 1 tablet by mouth daily.  Marland Kitchen omega-3 acid ethyl esters (LOVAZA) 1 g capsule TAKE (2) CAPSULES BY MOUTH TWICE DAILY.  Marland Kitchen oxyCODONE (OXY IR/ROXICODONE) 5 MG immediate release tablet Take 1 tablet (5 mg total) by mouth every 6 (six) hours as needed for severe pain.  . potassium chloride (KLOR-CON) 10 MEQ tablet TAKE 1 TABLET BY MOUTH DAILY.  . rosuvastatin (CRESTOR) 10 MG tablet TAKE 1 TABLET ONCE DAILY.  Marland Kitchen torsemide (DEMADEX) 20 MG tablet TAKE 1 TABLET ONCE DAILY.  Marland Kitchen VIIBRYD 40 MG TABS TAKE 1 TABLET ONCE DAILY.  . [DISCONTINUED] buPROPion (WELLBUTRIN XL) 150 MG 24 hr tablet TAKE 2 TABLETS IN THE MORNING AND 1 TABLET IN THE EVENING.  . [DISCONTINUED] K-DUR 10 MEQ tablet TAKE 1 TABLET DAILY.  . [DISCONTINUED] liothyronine (CYTOMEL) 25 MCG tablet TAKE 1 TABLET ONCE DAILY.  . [DISCONTINUED] liothyronine (CYTOMEL) 5 MCG tablet TAKE 1 TABLET ONCE DAILY.  . [DISCONTINUED] potassium chloride (K-DUR) 10 MEQ tablet TAKE 1 TABLET BY MOUTH DAILY.  . [DISCONTINUED] SYNTHROID 25 MCG tablet TAKE 1 TABLET ONCE DAILY.   No facility-administered encounter medications on file as of 08/15/2020.    Current Diagnosis: Patient Active Problem List   Diagnosis Date Noted  . Hyperlipidemia with target LDL less than 100 10/15/2019  . Routine general medical examination at a health care  facility 10/14/2019  . Chronic gout due to renal impairment of multiple sites without tophus 04/13/2019  . LVH (left ventricular hypertrophy) due to hypertensive disease, with heart failure ([place]) 11/20/2018  . Diastolic dysfunction with chronic heart failure ([place]) 11/20/2018  . Esophageal dysphagia 10/30/2018  . Primary osteoarthritis of both knees 08/28/2016  . Radiculitis of right cervical region 07/30/2016  . Ocular rosacea 12/21/2015  . Pure hyperglyceridemia 11/01/2015  . Nonspecific abnormal electrocardiogram (ECG) (EKG) 10/31/2015    . Nuclear sclerosis of both eyes 01/05/2015  . Heart murmur, systolic 33/54/5625  . Depression with anxiety 07/22/2014  . Asthma, intermittent 06/03/2014  . Visit for screening mammogram 06/03/2014  . Spinal stenosis, lumbar region, with neurogenic claudication 06/03/2014  . Kidney disease, chronic, stage IV (GFR 15-29 ml/min) ([place]) 06/03/2014  . Parathyroid hormone excess ([place]) 08/31/2013  . Celiac disease 08/18/2007  . Essential hypertension 05/06/2007  . Allergic rhinitis 05/06/2007  . OSTEOPOROSIS 05/06/2007  . Other specified hypothyroidism 04/28/2007  . Morbid obesity ([place]) 04/28/2007  . GERD 04/28/2007    Goals Addressed   None     Follow-Up:  Pharmacist Review  Called and spoke with patient to see if she was having any issues with her health since the last time she seen the clinical pharmacist Mendel Ryder. The patient stated that she is doing well. She also stated that she does not check her blood pressure on a regular basis and that she has not changed her diet or exercise. The patient stated that she does not have any concerns or issues to discuss at this time. I told the patient that I would pass along this information to [place].     Wendy Poet, Clinical Pharmacist Assistant [facility]

## 2020-09-17 ENCOUNTER — Other Ambulatory Visit: Payer: Self-pay | Admitting: Internal Medicine

## 2020-09-17 DIAGNOSIS — E038 Other specified hypothyroidism: Secondary | ICD-10-CM

## 2020-09-21 DIAGNOSIS — L718 Other rosacea: Secondary | ICD-10-CM | POA: Diagnosis not present

## 2020-09-21 DIAGNOSIS — Z961 Presence of intraocular lens: Secondary | ICD-10-CM | POA: Diagnosis not present

## 2020-10-10 ENCOUNTER — Other Ambulatory Visit: Payer: Self-pay | Admitting: Internal Medicine

## 2020-10-10 DIAGNOSIS — F418 Other specified anxiety disorders: Secondary | ICD-10-CM

## 2020-10-10 DIAGNOSIS — I10 Essential (primary) hypertension: Secondary | ICD-10-CM

## 2020-10-10 DIAGNOSIS — E781 Pure hyperglyceridemia: Secondary | ICD-10-CM

## 2020-10-10 MED ORDER — BUPROPION HCL ER (XL) 150 MG PO TB24: 450.0000 mg | ORAL_TABLET | Freq: Every day | ORAL | 1 refills | Status: DC

## 2020-10-10 MED ORDER — POTASSIUM CHLORIDE ER 10 MEQ PO TBCR: 10.0000 meq | EXTENDED_RELEASE_TABLET | Freq: Every day | ORAL | 0 refills | Status: DC

## 2020-10-13 ENCOUNTER — Ambulatory Visit: Payer: Medicare Other | Admitting: Internal Medicine

## 2020-10-14 ENCOUNTER — Ambulatory Visit (INDEPENDENT_AMBULATORY_CARE_PROVIDER_SITE_OTHER): Payer: Medicare Other

## 2020-10-14 ENCOUNTER — Encounter: Payer: Self-pay | Admitting: Internal Medicine

## 2020-10-14 ENCOUNTER — Other Ambulatory Visit: Payer: Self-pay

## 2020-10-14 ENCOUNTER — Ambulatory Visit (INDEPENDENT_AMBULATORY_CARE_PROVIDER_SITE_OTHER): Payer: Medicare Other | Admitting: Internal Medicine

## 2020-10-14 VITALS — BP 138/88 | HR 66 | Temp 98.6°F | Ht 64.0 in | Wt 222.0 lb

## 2020-10-14 DIAGNOSIS — M5441 Lumbago with sciatica, right side: Secondary | ICD-10-CM

## 2020-10-14 DIAGNOSIS — M5136 Other intervertebral disc degeneration, lumbar region: Secondary | ICD-10-CM | POA: Diagnosis not present

## 2020-10-14 DIAGNOSIS — M5412 Radiculopathy, cervical region: Secondary | ICD-10-CM | POA: Diagnosis not present

## 2020-10-14 DIAGNOSIS — M48062 Spinal stenosis, lumbar region with neurogenic claudication: Secondary | ICD-10-CM

## 2020-10-14 DIAGNOSIS — M5442 Lumbago with sciatica, left side: Secondary | ICD-10-CM | POA: Diagnosis not present

## 2020-10-14 DIAGNOSIS — M170 Bilateral primary osteoarthritis of knee: Secondary | ICD-10-CM | POA: Diagnosis not present

## 2020-10-14 DIAGNOSIS — M545 Low back pain, unspecified: Secondary | ICD-10-CM | POA: Diagnosis not present

## 2020-10-14 DIAGNOSIS — M51369 Other intervertebral disc degeneration, lumbar region without mention of lumbar back pain or lower extremity pain: Secondary | ICD-10-CM | POA: Insufficient documentation

## 2020-10-14 MED ORDER — METHYLPREDNISOLONE 4 MG PO TBPK: ORAL_TABLET | ORAL | 0 refills | Status: AC

## 2020-10-14 MED ORDER — OXYCODONE HCL 5 MG PO TABS: 5.0000 mg | ORAL_TABLET | Freq: Four times a day (QID) | ORAL | 0 refills | Status: DC | PRN

## 2020-10-14 MED ORDER — TIZANIDINE HCL 2 MG PO TABS: 2.0000 mg | ORAL_TABLET | Freq: Three times a day (TID) | ORAL | 1 refills | Status: DC | PRN

## 2020-10-14 NOTE — Patient Instructions (Signed)
Acute Back Pain, Adult Acute back pain is sudden and usually short-lived. It is often caused by an injury to the muscles and tissues in the back. The injury may result from:  A muscle or ligament getting overstretched or torn (strained). Ligaments are tissues that connect bones to each other. Lifting something improperly can cause a back strain.  Wear and tear (degeneration) of the spinal disks. Spinal disks are circular tissue that provide cushioning between the bones of the spine (vertebrae).  Twisting motions, such as while playing sports or doing yard work.  A hit to the back.  Arthritis. You may have a physical exam, lab tests, and imaging tests to find the cause of your pain. Acute back pain usually goes away with rest and home care. Follow these instructions at home: Managing pain, stiffness, and swelling  Treatment may include medicines for pain and inflammation that are taken by mouth or applied to the skin, prescription pain medicine, or muscle relaxants. Take over-the-counter and prescription medicines only as told by your health care provider.  Your health care provider may recommend applying ice during the first 24-48 hours after your pain starts. To do this: ? Put ice in a plastic bag. ? Place a towel between your skin and the bag. ? Leave the ice on for 20 minutes, 2-3 times a day.  If directed, apply heat to the affected area as often as told by your health care provider. Use the heat source that your health care provider recommends, such as a moist heat pack or a heating pad. ? Place a towel between your skin and the heat source. ? Leave the heat on for 20-30 minutes. ? Remove the heat if your skin turns bright red. This is especially important if you are unable to feel pain, heat, or cold. You have a greater risk of getting burned. Activity  Do not stay in bed. Staying in bed for more than 1-2 days can delay your recovery.  Sit up and stand up straight. Avoid leaning  forward when you sit or hunching over when you stand. ? If you work at a desk, sit close to it so you do not need to lean over. Keep your chin tucked in. Keep your neck drawn back, and keep your elbows bent at a 90-degree angle (right angle). ? Sit high and close to the steering wheel when you drive. Add lower back (lumbar) support to your car seat, if needed.  Take short walks on even surfaces as soon as you are able. Try to increase the length of time you walk each day.  Do not sit, drive, or stand in one place for more than 30 minutes at a time. Sitting or standing for long periods of time can put stress on your back.  Do not drive or use heavy machinery while taking prescription pain medicine.  Use proper lifting techniques. When you bend and lift, use positions that put less stress on your back: ? Bend your knees. ? Keep the load close to your body. ? Avoid twisting.  Exercise regularly as told by your health care provider. Exercising helps your back heal faster and helps prevent back injuries by keeping muscles strong and flexible.  Work with a physical therapist to make a safe exercise program, as recommended by your health care provider. Do any exercises as told by your physical therapist.   Lifestyle  Maintain a healthy weight. Extra weight puts stress on your back and makes it difficult to have   good posture.  Avoid activities or situations that make you feel anxious or stressed. Stress and anxiety increase muscle tension and can make back pain worse. Learn ways to manage anxiety and stress, such as through exercise. General instructions  Sleep on a firm mattress in a comfortable position. Try lying on your side with your knees slightly bent. If you lie on your back, put a pillow under your knees.  Follow your treatment plan as told by your health care provider. This may include: ? Cognitive or behavioral therapy. ? Acupuncture or massage therapy. ? Meditation or yoga. Contact  a health care provider if:  You have pain that is not relieved with rest or medicine.  You have increasing pain going down into your legs or buttocks.  Your pain does not improve after 2 weeks.  You have pain at night.  You lose weight without trying.  You have a fever or chills. Get help right away if:  You develop new bowel or bladder control problems.  You have unusual weakness or numbness in your arms or legs.  You develop nausea or vomiting.  You develop abdominal pain.  You feel faint. Summary  Acute back pain is sudden and usually short-lived.  Use proper lifting techniques. When you bend and lift, use positions that put less stress on your back.  Take over-the-counter and prescription medicines and apply heat or ice as directed by your health care provider. This information is not intended to replace advice given to you by your health care provider. Make sure you discuss any questions you have with your health care provider. Document Revised: 05/27/2020 Document Reviewed: 05/27/2020 Elsevier Patient Education  2021 [employer]  

## 2020-10-14 NOTE — Progress Notes (Signed)
Subjective:  Patient ID: Leslie Edwards, female    DOB: 1945-11-29  Age: 75 y.o. MRN: [id number]  CC: Back Pain  This visit occurred during the SARS-CoV-2 public health emergency.  Safety protocols were in place, including screening questions prior to the visit, additional usage of staff PPE, and extensive cleaning of exam room while observing appropriate contact time as indicated for disinfecting solutions.    HPI [name] presents for f/up - She complains of a 3-week history of worsening low back pain that radiates into her thighs.  She denies trauma, injury, or paresthesias.  She is not getting adequate symptom relief with oxycodone.  Outpatient Medications Prior to Visit  Medication Sig Dispense Refill  . albuterol (PROVENTIL HFA;VENTOLIN HFA) 108 (90 Base) MCG/ACT inhaler Inhale 2 puffs into the lungs every 6 (six) hours as needed. 18 g 11  . allopurinol (ZYLOPRIM) 100 MG tablet TAKE 1 TABLET ONCE DAILY. 90 tablet 0  . Ascorbic Acid (VITAMIN C) 1000 MG tablet Take 1,000 mg by mouth daily.    Marland Kitchen b complex vitamins tablet Take 1 tablet by mouth daily.    . budesonide-formoterol (SYMBICORT) 160-4.5 MCG/ACT inhaler Inhale 2 puffs into the lungs 2 (two) times daily. 1 Inhaler 11  . buPROPion (WELLBUTRIN XL) 150 MG 24 hr tablet Take 3 tablets (450 mg total) by mouth daily. 270 tablet 1  . co-enzyme Q-10 30 MG capsule Take 30 mg by mouth 3 (three) times daily.    . Colchicine (MITIGARE) 0.6 MG CAPS Take 1 tablet by mouth 2 (two) times daily. 180 capsule 0  . famotidine (PEPCID) 10 MG tablet Take 10 mg by mouth 2 (two) times daily as needed for heartburn or indigestion.    . hyoscyamine (LEVSIN SL) 0.125 MG SL tablet Place 1 tablet (0.125 mg total) under the tongue every 4 (four) hours as needed. 30 tablet 11  . ipratropium-albuterol (DUONEB) 0.5-2.5 (3) MG/3ML SOLN Take 3 mLs by nebulization as needed. 360 mL 1  . liothyronine (CYTOMEL) 25 MCG tablet TAKE 1 TABLET ONCE DAILY. 90 tablet  0  . liothyronine (CYTOMEL) 5 MCG tablet TAKE 1 TABLET ONCE DAILY. 90 tablet 0  . Multiple Vitamin (MULTIVITAMIN) tablet Take 1 tablet by mouth daily.    Marland Kitchen omega-3 acid ethyl esters (LOVAZA) 1 g capsule TAKE (2) CAPSULES BY MOUTH TWICE DAILY. 360 capsule 0  . potassium chloride (KLOR-CON) 10 MEQ tablet Take 1 tablet (10 mEq total) by mouth daily. 90 tablet 0  . rosuvastatin (CRESTOR) 10 MG tablet TAKE 1 TABLET ONCE DAILY. 90 tablet 0  . SYNTHROID 50 MCG tablet TAKE 1 TABLET DAILY BEFORE BREAKFAST FOR HYPOTHYROIDISM 90 tablet 1  . torsemide (DEMADEX) 20 MG tablet TAKE 1 TABLET ONCE DAILY. 90 tablet 1  . VIIBRYD 40 MG TABS TAKE 1 TABLET ONCE DAILY. 90 tablet 1  . oxyCODONE (OXY IR/ROXICODONE) 5 MG immediate release tablet Take 1 tablet (5 mg total) by mouth every 6 (six) hours as needed for severe pain. 60 tablet 0  . levocetirizine (XYZAL) 5 MG tablet Take 2 tablets (10 mg total) by mouth every evening for 7 days. 14 tablet 0   No facility-administered medications prior to visit.    ROS Review of Systems  Constitutional: Negative.  Negative for chills, diaphoresis, fatigue and fever.  HENT: Negative.   Eyes: Negative.   Respiratory: Negative for cough, chest tightness, shortness of breath and wheezing.   Cardiovascular: Negative for chest pain, palpitations and leg swelling.  Gastrointestinal: Negative for abdominal pain, constipation, diarrhea, nausea and vomiting.  Endocrine: Negative.   Genitourinary: Negative.  Negative for difficulty urinating.  Musculoskeletal: Positive for arthralgias, back pain and neck pain. Negative for myalgias.  Skin: Negative.   Neurological: Negative.  Negative for dizziness, weakness and light-headedness.  Hematological: Negative for adenopathy. Does not bruise/bleed easily.  Psychiatric/Behavioral: Negative.     Objective:  BP 138/88   Pulse 66   Temp 98.6 F (37 C) (Oral)   Ht 5\' 4"  (1.626 m)   Wt 222 lb (100.7 kg)   SpO2 97%   BMI 38.11 kg/m    BP Readings from Last 3 Encounters:  10/14/20 138/88  04/12/20 (!) 138/80  10/14/19 136/78    Wt Readings from Last 3 Encounters:  10/14/20 222 lb (100.7 kg)  04/12/20 (!) 208 lb (94.3 kg)  10/14/19 203 lb (92.1 kg)    Physical Exam Vitals reviewed.  HENT:     Nose: Nose normal.     Mouth/Throat:     Mouth: Mucous membranes are moist.  Eyes:     General: No scleral icterus.    Conjunctiva/sclera: Conjunctivae normal.  Cardiovascular:     Rate and Rhythm: Normal rate and regular rhythm.     Heart sounds: No murmur heard.   Pulmonary:     Effort: Pulmonary effort is normal.     Breath sounds: No wheezing, rhonchi or rales.  Abdominal:     General: Abdomen is flat.     Palpations: There is no mass.     Tenderness: There is no abdominal tenderness. There is no guarding.  Musculoskeletal:     Cervical back: Normal and neck supple.     Thoracic back: Normal.     Lumbar back: No signs of trauma, spasms or bony tenderness. Decreased range of motion. Negative right straight leg raise test and negative left straight leg raise test.     Right lower leg: No edema.     Left lower leg: No edema.  Lymphadenopathy:     Cervical: No cervical adenopathy.  Skin:    General: Skin is warm and dry.  Neurological:     Mental Status: She is alert.     Deep Tendon Reflexes: Reflexes normal.     Lab Results  Component Value Date   WBC 6.5 10/14/2019   HGB 13.3 10/14/2019   HCT 39.3 10/14/2019   PLT 293.0 10/14/2019   GLUCOSE 100 (H) 04/12/2020   CHOL 215 (H) 10/14/2019   TRIG 280.0 (H) 10/14/2019   HDL 71.80 10/14/2019   LDLDIRECT 107.0 10/14/2019   LDLCALC 90 10/30/2018   ALT 17 10/30/2018   AST 18 10/30/2018   NA 141 04/12/2020   K 4.6 04/12/2020   CL 106 04/12/2020   CREATININE 1.36 (H) 04/12/2020   BUN 25 04/12/2020   CO2 26 04/12/2020   TSH 0.60 04/12/2020   HGBA1C 5.3 04/28/2009    MM CLIP PLACEMENT RIGHT  Result Date: 06/18/2019 CLINICAL DATA:  Patient  underwent biopsy of right breast calcifications. EXAM: DIAGNOSTIC RIGHT MAMMOGRAM POST STEREOTACTIC BIOPSY COMPARISON:  Previous exam(s). FINDINGS: Mammographic images were obtained following stereotactic guided biopsy of calcifications in the right breast at 12 o'clock. There is placement of a coil shaped clip at the biopsy site which is located approximately 1.0 cm inferior to the residual calcifications on the MLO view. IMPRESSION: Postprocedure mammogram demonstrates placement of a biopsy clip located approximately 1 cm inferior to the residual calcifications that were biopsied in the  right breast at 12 o'clock. Final Assessment: Post Procedure Mammograms for Marker Placement Electronically Signed   By: Audie Pinto M.D.   On: 06/18/2019 09:18   MM RT BREAST BX W LOC DEV 1ST LESION IMAGE BX SPEC STEREO GUIDE  Addendum Date: 06/19/2019   ADDENDUM REPORT: 06/19/2019 13:15 ADDENDUM: Pathology revealed SCLEROTIC FIBROADENOMATOID NODULE WITH CALCIFICATIONS of the RIGHT breast, 12 o'clock. This was found to be concordant by Dr. Audie Pinto. Pathology results were discussed with the patient by telephone. The patient reported doing well after the biopsy with tenderness at the site. Post biopsy instructions and care were reviewed and questions were answered. The patient was encouraged to call [facility] for any additional concerns. The patient was instructed to return for annual screening mammography and informed a reminder notice would be sent regarding this appointment. Pathology results reported by Stacie Acres, RN on 06/19/2019. Electronically Signed   By: Audie Pinto M.D.   On: 06/19/2019 13:15   Result Date: 06/19/2019 CLINICAL DATA:  Patient presents for biopsy of right breast calcifications. EXAM: RIGHT BREAST STEREOTACTIC CORE NEEDLE BIOPSY COMPARISON:  Previous exams. FINDINGS: The patient and I discussed the procedure of stereotactic-guided biopsy including  benefits and alternatives. We discussed the high likelihood of a successful procedure. We discussed the risks of the procedure including infection, bleeding, tissue injury, clip migration, and inadequate sampling. Informed written consent was given. The usual time out protocol was performed immediately prior to the procedure. Using sterile technique and 1% Lidocaine as local anesthetic, under stereotactic guidance, a 9 gauge vacuum assisted device was used to perform core needle biopsy of calcifications in the right breast at 12 o'clock using a superior approach. Specimen radiograph was performed showing 2 of 6 specimens with calcifications. Specimens with calcifications are identified for pathology. Lesion quadrant: Upper outer quadrant At the conclusion of the procedure, coil tissue marker clip was deployed into the biopsy cavity. Follow-up 2-view mammogram was performed and dictated separately. IMPRESSION: Stereotactic-guided biopsy of right breast calcifications at 12 o'clock. No apparent complications. Electronically Signed: By: Audie Pinto M.D. On: 06/18/2019 09:14   EXAM: LUMBAR SPINE - COMPLETE 4+ VIEW  COMPARISON:  Lumbar radiographs 03/13/2012.  FINDINGS: Normal lumbar segmentation. Increased levoconvex lumbar scoliosis. Lumbar lordosis is similar to 2013. Grade 1 anterolisthesis of L4 on L5 is increased, now to 6 mm. Chronic mild retrolisthesis at L3-L4. No pars fracture identified. Visible sacrum and SI joints appear grossly intact. No acute osseous abnormality identified.  Chronic severe lumbar disc degeneration with multilevel chronic vacuum disc. Bulky endplate spurring at Z8-H8. Similar severe chronic disc and endplate degeneration At T11-T12. Moderate lower lumbar facet hypertrophy.  Chronic pelvic dystrophic calcifications likely a combination of phleboliths and uterine fibroid. Chronic epigastric surgical clips. Nonobstructed visible bowel gas  pattern.  IMPRESSION: 1. Chronic severe lumbar disc and endplate degeneration. Increased grade 1 anterolisthesis of L4 on L5 since 2013. 2. No acute osseous abnormality identified in the lumbar spine.   Assessment & Plan:   Chiyeko was seen today for back pain.  Diagnoses and all orders for this visit:  Acute bilateral low back pain with bilateral sciatica -     DG Lumbar Spine Complete; Future -     oxyCODONE (OXY IR/ROXICODONE) 5 MG immediate release tablet; Take 1 tablet (5 mg total) by mouth every 6 (six) hours as needed for severe pain. -     tiZANidine (ZANAFLEX) 2 MG tablet; Take 1 tablet (2 mg total) by  mouth every 8 (eight) hours as needed for muscle spasms. -     methylPREDNISolone (MEDROL DOSEPAK) 4 MG TBPK tablet; TAKE AS DIRECTED  DDD (degenerative disc disease), lumbar -     oxyCODONE (OXY IR/ROXICODONE) 5 MG immediate release tablet; Take 1 tablet (5 mg total) by mouth every 6 (six) hours as needed for severe pain. -     tiZANidine (ZANAFLEX) 2 MG tablet; Take 1 tablet (2 mg total) by mouth every 8 (eight) hours as needed for muscle spasms. -     methylPREDNISolone (MEDROL DOSEPAK) 4 MG TBPK tablet; TAKE AS DIRECTED  Radiculitis of right cervical region -     oxyCODONE (OXY IR/ROXICODONE) 5 MG immediate release tablet; Take 1 tablet (5 mg total) by mouth every 6 (six) hours as needed for severe pain. -     tiZANidine (ZANAFLEX) 2 MG tablet; Take 1 tablet (2 mg total) by mouth every 8 (eight) hours as needed for muscle spasms. -     methylPREDNISolone (MEDROL DOSEPAK) 4 MG TBPK tablet; TAKE AS DIRECTED  Primary osteoarthritis of both knees -     oxyCODONE (OXY IR/ROXICODONE) 5 MG immediate release tablet; Take 1 tablet (5 mg total) by mouth every 6 (six) hours as needed for severe pain. -     methylPREDNISolone (MEDROL DOSEPAK) 4 MG TBPK tablet; TAKE AS DIRECTED  Spinal stenosis, lumbar region, with neurogenic claudication-  She is having a flare up of pain but is  neurologically intact. -     oxyCODONE (OXY IR/ROXICODONE) 5 MG immediate release tablet; Take 1 tablet (5 mg total) by mouth every 6 (six) hours as needed for severe pain. -     tiZANidine (ZANAFLEX) 2 MG tablet; Take 1 tablet (2 mg total) by mouth every 8 (eight) hours as needed for muscle spasms. -     methylPREDNISolone (MEDROL DOSEPAK) 4 MG TBPK tablet; TAKE AS DIRECTED   I am having Skyanne H. Fecteau start on tiZANidine and methylPREDNISolone. I am also having her maintain her hyoscyamine, budesonide-formoterol, albuterol, ipratropium-albuterol, Colchicine, levocetirizine, vitamin C, co-enzyme Q-10, multivitamin, b complex vitamins, famotidine, Viibryd, torsemide, liothyronine, allopurinol, rosuvastatin, Synthroid, omega-3 acid ethyl esters, liothyronine, buPROPion, potassium chloride, and oxyCODONE.  Meds ordered this encounter  Medications  . oxyCODONE (OXY IR/ROXICODONE) 5 MG immediate release tablet    Sig: Take 1 tablet (5 mg total) by mouth every 6 (six) hours as needed for severe pain.    Dispense:  60 tablet    Refill:  0  . tiZANidine (ZANAFLEX) 2 MG tablet    Sig: Take 1 tablet (2 mg total) by mouth every 8 (eight) hours as needed for muscle spasms.    Dispense:  90 tablet    Refill:  1  . methylPREDNISolone (MEDROL DOSEPAK) 4 MG TBPK tablet    Sig: TAKE AS DIRECTED    Dispense:  21 tablet    Refill:  0     Follow-up: Return in about 3 weeks (around 11/04/2020).  Scarlette Calico, MD

## 2020-11-09 ENCOUNTER — Ambulatory Visit (INDEPENDENT_AMBULATORY_CARE_PROVIDER_SITE_OTHER): Payer: Medicare Other | Admitting: Internal Medicine

## 2020-11-09 ENCOUNTER — Other Ambulatory Visit: Payer: Self-pay

## 2020-11-09 ENCOUNTER — Encounter: Payer: Self-pay | Admitting: Internal Medicine

## 2020-11-09 VITALS — BP 160/92 | HR 69 | Temp 98.2°F | Resp 16 | Ht 64.0 in | Wt 223.0 lb

## 2020-11-09 DIAGNOSIS — E213 Hyperparathyroidism, unspecified: Secondary | ICD-10-CM | POA: Diagnosis not present

## 2020-11-09 DIAGNOSIS — Z23 Encounter for immunization: Secondary | ICD-10-CM

## 2020-11-09 DIAGNOSIS — M5136 Other intervertebral disc degeneration, lumbar region: Secondary | ICD-10-CM

## 2020-11-09 DIAGNOSIS — E038 Other specified hypothyroidism: Secondary | ICD-10-CM

## 2020-11-09 DIAGNOSIS — Z0000 Encounter for general adult medical examination without abnormal findings: Secondary | ICD-10-CM | POA: Diagnosis not present

## 2020-11-09 DIAGNOSIS — I10 Essential (primary) hypertension: Secondary | ICD-10-CM | POA: Diagnosis not present

## 2020-11-09 DIAGNOSIS — E785 Hyperlipidemia, unspecified: Secondary | ICD-10-CM

## 2020-11-09 DIAGNOSIS — Z1231 Encounter for screening mammogram for malignant neoplasm of breast: Secondary | ICD-10-CM

## 2020-11-09 DIAGNOSIS — Z0001 Encounter for general adult medical examination with abnormal findings: Secondary | ICD-10-CM | POA: Insufficient documentation

## 2020-11-09 DIAGNOSIS — N184 Chronic kidney disease, stage 4 (severe): Secondary | ICD-10-CM

## 2020-11-09 LAB — URINALYSIS, ROUTINE W REFLEX MICROSCOPIC
Bilirubin Urine: NEGATIVE
Hgb urine dipstick: NEGATIVE
Ketones, ur: NEGATIVE
Leukocytes,Ua: NEGATIVE
Nitrite: NEGATIVE
RBC / HPF: NONE SEEN (ref 0–?)
Specific Gravity, Urine: <=1.005 — AB (ref 1.000–1.030)
Total Protein, Urine: NEGATIVE
Urine Glucose: NEGATIVE
Urobilinogen, UA: 0.2 (ref 0.0–1.0)
pH: 6 (ref 5.0–8.0)

## 2020-11-09 LAB — CBC WITH DIFFERENTIAL/PLATELET
Basophils Absolute: 0.1 10*3/uL (ref 0.0–0.1)
Basophils Relative: 1 % (ref 0.0–3.0)
Eosinophils Absolute: 0.4 10*3/uL (ref 0.0–0.7)
Eosinophils Relative: 5.9 % — ABNORMAL HIGH (ref 0.0–5.0)
HCT: 38.3 % (ref 36.0–46.0)
Hemoglobin: 13.1 g/dL (ref 12.0–15.0)
Lymphocytes Relative: 26.1 % (ref 12.0–46.0)
Lymphs Abs: 1.7 10*3/uL (ref 0.7–4.0)
MCHC: 34.2 g/dL (ref 30.0–36.0)
MCV: 97 fl (ref 78.0–100.0)
Monocytes Absolute: 0.6 10*3/uL (ref 0.1–1.0)
Monocytes Relative: 10 % (ref 3.0–12.0)
Neutro Abs: 3.6 10*3/uL (ref 1.4–7.7)
Neutrophils Relative %: 57 % (ref 43.0–77.0)
Platelets: 264 10*3/uL (ref 150.0–400.0)
RBC: 3.95 Mil/uL (ref 3.87–5.11)
RDW: 13.9 % (ref 11.5–15.5)
WBC: 6.4 10*3/uL (ref 4.0–10.5)

## 2020-11-09 LAB — BASIC METABOLIC PANEL
BUN: 23 mg/dL (ref 6–23)
CO2: 28 mEq/L (ref 19–32)
Calcium: 9.9 mg/dL (ref 8.4–10.5)
Chloride: 102 mEq/L (ref 96–112)
Creatinine, Ser: 1.27 mg/dL — ABNORMAL HIGH (ref 0.40–1.20)
GFR: 41.28 mL/min — ABNORMAL LOW (ref >60.00)
Glucose, Bld: 100 mg/dL — ABNORMAL HIGH (ref 70–99)
Potassium: 4.2 mEq/L (ref 3.5–5.1)
Sodium: 137 mEq/L (ref 135–145)

## 2020-11-09 LAB — LIPID PANEL
Cholesterol: 198 mg/dL (ref 0–200)
HDL: 91.8 mg/dL (ref >39.00)
LDL Cholesterol: 73 mg/dL (ref 0–99)
NonHDL: 105.77
Total CHOL/HDL Ratio: 2
Triglycerides: 166 mg/dL — ABNORMAL HIGH (ref 0.0–149.0)
VLDL: 33.2 mg/dL (ref 0.0–40.0)

## 2020-11-09 LAB — HEPATIC FUNCTION PANEL
ALT: 32 U/L (ref 0–35)
AST: 25 U/L (ref 0–37)
Albumin: 4.5 g/dL (ref 3.5–5.2)
Alkaline Phosphatase: 76 U/L (ref 39–117)
Bilirubin, Direct: 0.2 mg/dL (ref 0.0–0.3)
Total Bilirubin: 0.8 mg/dL (ref 0.2–1.2)
Total Protein: 7.3 g/dL (ref 6.0–8.3)

## 2020-11-09 MED ORDER — IRBESARTAN 150 MG PO TABS: 150.0000 mg | ORAL_TABLET | Freq: Every day | ORAL | 1 refills | Status: DC

## 2020-11-09 NOTE — Progress Notes (Signed)
Subjective:  Patient ID: Leslie Edwards, female    DOB: 02/19/1946  Age: 75 y.o. MRN: [id number]  CC: Annual Exam, Hypothyroidism, Hypertension, Hyperlipidemia, and Back Pain  This visit occurred during the SARS-CoV-2 public health emergency.  Safety protocols were in place, including screening questions prior to the visit, additional usage of staff PPE, and extensive cleaning of exam room while observing appropriate contact time as indicated for disinfecting solutions.    HPI [employer] presents for a CPX.  Her low back pain has improved but persists.  It occasionally radiates into her lower extremities.  She denies paresthesias.  She is getting some symptom relief with oxycodone and tizanidine.  She has not been very active recently.  She denies any recent episodes of headache, blurred vision, chest pain, shortness of breath of breath, or diaphoresis.  She has persistent, unchanged lower extremity edema.  Outpatient Medications Prior to Visit  Medication Sig Dispense Refill  . albuterol (PROVENTIL HFA;VENTOLIN HFA) 108 (90 Base) MCG/ACT inhaler Inhale 2 puffs into the lungs every 6 (six) hours as needed. 18 g 11  . allopurinol (ZYLOPRIM) 100 MG tablet TAKE 1 TABLET ONCE DAILY. 90 tablet 0  . Ascorbic Acid (VITAMIN C) 1000 MG tablet Take 1,000 mg by mouth daily.    Marland Kitchen b complex vitamins tablet Take 1 tablet by mouth daily.    . budesonide-formoterol (SYMBICORT) 160-4.5 MCG/ACT inhaler Inhale 2 puffs into the lungs 2 (two) times daily. 1 Inhaler 11  . buPROPion (WELLBUTRIN XL) 150 MG 24 hr tablet Take 3 tablets (450 mg total) by mouth daily. 270 tablet 1  . co-enzyme Q-10 30 MG capsule Take 30 mg by mouth 3 (three) times daily.    . Colchicine (MITIGARE) 0.6 MG CAPS Take 1 tablet by mouth 2 (two) times daily. 180 capsule 0  . famotidine (PEPCID) 10 MG tablet Take 10 mg by mouth 2 (two) times daily as needed for heartburn or indigestion.    . hyoscyamine (LEVSIN SL) 0.125 MG SL tablet  Place 1 tablet (0.125 mg total) under the tongue every 4 (four) hours as needed. 30 tablet 11  . ipratropium-albuterol (DUONEB) 0.5-2.5 (3) MG/3ML SOLN Take 3 mLs by nebulization as needed. 360 mL 1  . liothyronine (CYTOMEL) 25 MCG tablet TAKE 1 TABLET ONCE DAILY. 90 tablet 0  . liothyronine (CYTOMEL) 5 MCG tablet TAKE 1 TABLET ONCE DAILY. 90 tablet 0  . Multiple Vitamin (MULTIVITAMIN) tablet Take 1 tablet by mouth daily.    Marland Kitchen omega-3 acid ethyl esters (LOVAZA) 1 g capsule TAKE (2) CAPSULES BY MOUTH TWICE DAILY. 360 capsule 0  . oxyCODONE (OXY IR/ROXICODONE) 5 MG immediate release tablet Take 1 tablet (5 mg total) by mouth every 6 (six) hours as needed for severe pain. 60 tablet 0  . potassium chloride (KLOR-CON) 10 MEQ tablet Take 1 tablet (10 mEq total) by mouth daily. 90 tablet 0  . rosuvastatin (CRESTOR) 10 MG tablet TAKE 1 TABLET ONCE DAILY. 90 tablet 0  . SYNTHROID 50 MCG tablet TAKE 1 TABLET DAILY BEFORE BREAKFAST FOR HYPOTHYROIDISM 90 tablet 1  . tiZANidine (ZANAFLEX) 2 MG tablet Take 1 tablet (2 mg total) by mouth every 8 (eight) hours as needed for muscle spasms. 90 tablet 1  . torsemide (DEMADEX) 20 MG tablet TAKE 1 TABLET ONCE DAILY. 90 tablet 1  . VIIBRYD 40 MG TABS TAKE 1 TABLET ONCE DAILY. 90 tablet 1  . levocetirizine (XYZAL) 5 MG tablet Take 2 tablets (10 mg total) by  mouth every evening for 7 days. 14 tablet 0   No facility-administered medications prior to visit.    ROS Review of Systems  Constitutional: Positive for unexpected weight change (wt gain). Negative for appetite change, chills, diaphoresis and fatigue.  HENT: Negative.   Eyes: Negative.   Respiratory: Negative for cough, chest tightness, shortness of breath and wheezing.   Cardiovascular: Positive for leg swelling. Negative for chest pain and palpitations.  Gastrointestinal: Positive for constipation. Negative for abdominal pain, diarrhea, nausea and vomiting.  Endocrine: Negative.   Genitourinary: Negative.   Negative for difficulty urinating.  Musculoskeletal: Positive for arthralgias and back pain. Negative for myalgias.  Skin: Negative.  Negative for color change and pallor.  Neurological: Negative.  Negative for dizziness, weakness and light-headedness.  Hematological: Negative for adenopathy. Does not bruise/bleed easily.  Psychiatric/Behavioral: Positive for sleep disturbance.    Objective:  BP (!) 160/92   Pulse 69   Temp 98.2 F (36.8 C) (Oral)   Resp 16   Ht 5\' 4"  (1.626 m)   Wt 223 lb (101.2 kg)   SpO2 96%   BMI 38.28 kg/m   BP Readings from Last 3 Encounters:  11/09/20 (!) 160/92  10/14/20 138/88  04/12/20 (!) 138/80    Wt Readings from Last 3 Encounters:  11/09/20 223 lb (101.2 kg)  10/14/20 222 lb (100.7 kg)  04/12/20 (!) 208 lb (94.3 kg)    Physical Exam Vitals reviewed.  Constitutional:      Appearance: She is obese.  HENT:     Nose: Nose normal.     Mouth/Throat:     Mouth: Mucous membranes are moist.  Eyes:     General: No scleral icterus.    Conjunctiva/sclera: Conjunctivae normal.  Cardiovascular:     Rate and Rhythm: Normal rate.     Heart sounds: Murmur heard.   Systolic murmur is present with a grade of 1/6.  No diastolic murmur is present. No friction rub. No gallop.   Pulmonary:     Effort: Pulmonary effort is normal.     Breath sounds: No stridor. No wheezing, rhonchi or rales.  Abdominal:     General: Abdomen is protuberant. Bowel sounds are normal. There is no distension.     Palpations: Abdomen is soft. There is no hepatomegaly, splenomegaly or mass.     Tenderness: There is no abdominal tenderness.  Musculoskeletal:     Cervical back: Neck supple.     Right lower leg: 1+ Edema (non-pitting) present.     Left lower leg: 1+ Edema (non-pitting) present.  Lymphadenopathy:     Cervical: No cervical adenopathy.  Skin:    General: Skin is warm and dry.     Findings: No lesion.  Neurological:     General: No focal deficit present.      Mental Status: She is alert.  Psychiatric:        Mood and Affect: Mood normal.        Behavior: Behavior normal.     Lab Results  Component Value Date   WBC 6.4 11/09/2020   HGB 13.1 11/09/2020   HCT 38.3 11/09/2020   PLT 264.0 11/09/2020   GLUCOSE 100 (H) 11/09/2020   CHOL 198 11/09/2020   TRIG 166.0 (H) 11/09/2020   HDL 91.80 11/09/2020   LDLDIRECT 107.0 10/14/2019   LDLCALC 73 11/09/2020   ALT 32 11/09/2020   AST 25 11/09/2020   NA 137 11/09/2020   K 4.2 11/09/2020   CL 102 11/09/2020  CREATININE 1.27 (H) 11/09/2020   BUN 23 11/09/2020   CO2 28 11/09/2020   TSH 0.60 04/12/2020   HGBA1C 5.3 04/28/2009    MM CLIP PLACEMENT RIGHT  Result Date: 06/18/2019 CLINICAL DATA:  Patient underwent biopsy of right breast calcifications. EXAM: DIAGNOSTIC RIGHT MAMMOGRAM POST STEREOTACTIC BIOPSY COMPARISON:  Previous exam(s). FINDINGS: Mammographic images were obtained following stereotactic guided biopsy of calcifications in the right breast at 12 o'clock. There is placement of a coil shaped clip at the biopsy site which is located approximately 1.0 cm inferior to the residual calcifications on the MLO view. IMPRESSION: Postprocedure mammogram demonstrates placement of a biopsy clip located approximately 1 cm inferior to the residual calcifications that were biopsied in the right breast at 12 o'clock. Final Assessment: Post Procedure Mammograms for Marker Placement Electronically Signed   By: Emmaline Kluver M.D.   On: 06/18/2019 09:18   MM RT BREAST BX W LOC DEV 1ST LESION IMAGE BX SPEC STEREO GUIDE  Addendum Date: 06/19/2019   ADDENDUM REPORT: 06/19/2019 13:15 ADDENDUM: Pathology revealed SCLEROTIC FIBROADENOMATOID NODULE WITH CALCIFICATIONS of the RIGHT breast, 12 o'clock. This was found to be concordant by Dr. Emmaline Kluver. Pathology results were discussed with the patient by telephone. The patient reported doing well after the biopsy with tenderness at the site. Post biopsy  instructions and care were reviewed and questions were answered. The patient was encouraged to call [facility] for any additional concerns. The patient was instructed to return for annual screening mammography and informed a reminder notice would be sent regarding this appointment. Pathology results reported by Collene Mares, RN on 06/19/2019. Electronically Signed   By: Emmaline Kluver M.D.   On: 06/19/2019 13:15   Result Date: 06/19/2019 CLINICAL DATA:  Patient presents for biopsy of right breast calcifications. EXAM: RIGHT BREAST STEREOTACTIC CORE NEEDLE BIOPSY COMPARISON:  Previous exams. FINDINGS: The patient and I discussed the procedure of stereotactic-guided biopsy including benefits and alternatives. We discussed the high likelihood of a successful procedure. We discussed the risks of the procedure including infection, bleeding, tissue injury, clip migration, and inadequate sampling. Informed written consent was given. The usual time out protocol was performed immediately prior to the procedure. Using sterile technique and 1% Lidocaine as local anesthetic, under stereotactic guidance, a 9 gauge vacuum assisted device was used to perform core needle biopsy of calcifications in the right breast at 12 o'clock using a superior approach. Specimen radiograph was performed showing 2 of 6 specimens with calcifications. Specimens with calcifications are identified for pathology. Lesion quadrant: Upper outer quadrant At the conclusion of the procedure, coil tissue marker clip was deployed into the biopsy cavity. Follow-up 2-view mammogram was performed and dictated separately. IMPRESSION: Stereotactic-guided biopsy of right breast calcifications at 12 o'clock. No apparent complications. Electronically Signed: By: Emmaline Kluver M.D. On: 06/18/2019 09:14    Assessment & Plan:   Leslie Edwards was seen today for annual exam, hypothyroidism, hypertension, hyperlipidemia and back  pain.  Diagnoses and all orders for this visit:  Essential hypertension- Her blood pressure is not adequately well controlled.  In addition to lifestyle modifications I have asked that she add an ARB to her current regimen. -     CBC with Differential/Platelet; Future -     Cancel: TSH; Future -     Urinalysis, Routine w reflex microscopic; Future -     Urinalysis, Routine w reflex microscopic -     CBC with Differential/Platelet -     irbesartan (AVAPRO)  150 MG tablet; Take 1 tablet (150 mg total) by mouth daily.  Other specified hypothyroidism- I will monitor her TFTs. -     Cancel: TSH; Future -     Thyroid Panel With TSH; Future -     Thyroid Panel With TSH  Parathyroid hormone excess (HCC)- Her calcium level is normal.  I will monitor her PTH level. -     PTH, intact and calcium; Future -     PTH, intact and calcium  Kidney disease, chronic, stage IV (GFR 15-29 ml/min) (HCC)-her renal function has improved slightly.  We will try to get better control of her blood sugar. -     Basic metabolic panel; Future -     Urinalysis, Routine w reflex microscopic; Future -     Urinalysis, Routine w reflex microscopic -     Basic metabolic panel  Hyperlipidemia with target LDL less than 100- She has achieved her LDL goal is doing well on the statin. -     Lipid panel; Future -     Cancel: TSH; Future -     Hepatic function panel; Future -     Hepatic function panel -     Lipid panel  DDD (degenerative disc disease), lumbar -     Ambulatory referral to Pain Clinic  Encounter for general adult medical examination with abnormal findings- Exam completed, labs reviewed, vaccines reviewed and updated, she has decided not to be screened for colon cancer, she is referred for breast cancer screening, patient education material was given..  Visit for screening mammogram -     MM DIGITAL SCREENING BILATERAL; Future  Other orders -     Pneumococcal polysaccharide vaccine 23-valent greater than  or equal to 2yo subcutaneous/IM   I am having Leslie Edwards start on irbesartan. I am also having her maintain her hyoscyamine, budesonide-formoterol, albuterol, ipratropium-albuterol, Colchicine, levocetirizine, vitamin C, co-enzyme Q-10, multivitamin, b complex vitamins, famotidine, Viibryd, torsemide, liothyronine, allopurinol, rosuvastatin, Synthroid, omega-3 acid ethyl esters, liothyronine, buPROPion, potassium chloride, oxyCODONE, and tiZANidine.  Meds ordered this encounter  Medications  . irbesartan (AVAPRO) 150 MG tablet    Sig: Take 1 tablet (150 mg total) by mouth daily.    Dispense:  90 tablet    Refill:  1     Follow-up: Return in about 3 months (around 02/06/2021).  Sanda Linger, MD

## 2020-11-09 NOTE — Patient Instructions (Signed)

## 2020-11-10 ENCOUNTER — Other Ambulatory Visit: Payer: Self-pay | Admitting: Internal Medicine

## 2020-11-10 LAB — PTH, INTACT AND CALCIUM
Calcium: 10 mg/dL (ref 8.6–10.4)
PTH: 58 pg/mL (ref 14–64)

## 2020-11-10 LAB — THYROID PANEL WITH TSH
Free Thyroxine Index: 1.4 (ref 1.4–3.8)
T3 Uptake: 30 % (ref 22–35)
T4, Total: 4.8 ug/dL — ABNORMAL LOW (ref 5.1–11.9)
TSH: 0.15 mIU/L — ABNORMAL LOW (ref 0.40–4.50)

## 2020-11-15 ENCOUNTER — Other Ambulatory Visit: Payer: Self-pay | Admitting: Internal Medicine

## 2020-11-15 DIAGNOSIS — F418 Other specified anxiety disorders: Secondary | ICD-10-CM

## 2020-11-17 ENCOUNTER — Encounter: Payer: Self-pay | Admitting: Physical Medicine and Rehabilitation

## 2020-11-18 ENCOUNTER — Other Ambulatory Visit: Payer: Self-pay | Admitting: Internal Medicine

## 2020-11-18 DIAGNOSIS — E785 Hyperlipidemia, unspecified: Secondary | ICD-10-CM

## 2020-11-18 DIAGNOSIS — M1A39X0 Chronic gout due to renal impairment, multiple sites, without tophus (tophi): Secondary | ICD-10-CM

## 2020-11-22 ENCOUNTER — Other Ambulatory Visit: Payer: Self-pay | Admitting: Internal Medicine

## 2020-11-22 DIAGNOSIS — I10 Essential (primary) hypertension: Secondary | ICD-10-CM

## 2020-12-01 ENCOUNTER — Other Ambulatory Visit: Payer: Self-pay

## 2020-12-01 ENCOUNTER — Encounter
Payer: Medicare Other | Attending: Physical Medicine and Rehabilitation | Admitting: Physical Medicine and Rehabilitation

## 2020-12-01 ENCOUNTER — Encounter: Payer: Self-pay | Admitting: Physical Medicine and Rehabilitation

## 2020-12-01 VITALS — BP 125/69 | HR 78 | Temp 98.4°F | Ht 64.0 in | Wt 225.0 lb

## 2020-12-01 DIAGNOSIS — M5412 Radiculopathy, cervical region: Secondary | ICD-10-CM

## 2020-12-01 DIAGNOSIS — M5442 Lumbago with sciatica, left side: Secondary | ICD-10-CM | POA: Diagnosis not present

## 2020-12-01 DIAGNOSIS — M48061 Spinal stenosis, lumbar region without neurogenic claudication: Secondary | ICD-10-CM

## 2020-12-01 DIAGNOSIS — M5441 Lumbago with sciatica, right side: Secondary | ICD-10-CM

## 2020-12-01 DIAGNOSIS — M48062 Spinal stenosis, lumbar region with neurogenic claudication: Secondary | ICD-10-CM | POA: Diagnosis not present

## 2020-12-01 DIAGNOSIS — M5136 Other intervertebral disc degeneration, lumbar region: Secondary | ICD-10-CM

## 2020-12-01 MED ORDER — TIZANIDINE HCL 2 MG PO TABS: 2.0000 mg | ORAL_TABLET | Freq: Every day | ORAL | 1 refills | Status: DC

## 2020-12-01 NOTE — Progress Notes (Signed)
Subjective:    Patient ID: Leslie Edwards, female    DOB: 01-25-1946, 75 y.o.   MRN: [id number]  HPI  Mrs. Leslie Edwards is a 75 year old woman who presents to establish care for lower back pain that radiates into bilateral lower extremities.  1) Lumbar spinal stenosis with neurogenic claudication -pain is worse with walking and standing -worst in the morning when she wakes and then improves throughout the day -average pain is 8/10, pain right now is 7/10 -pain is constant, sharp, stabbing, tingling and aching -Flexeril helps.  -She has never done PT -started in January without inciting event -back pain is worse than legs.   2) Obesity: -BMI 38.62, 225 lbs -discussed current diet and role of inflammation in pain  Pain Inventory Average Pain 8 Pain Right Now 7 My pain is constant, sharp, stabbing, tingling and aching  In the last 24 hours, has pain interfered with the following? General activity 9 Relation with others 9 Enjoyment of life 10 What TIME of day is your pain at its worst? morning  Sleep (in general) Fair  Pain is worse with: walking, bending, standing and some activites Pain improves with: rest, medication and heat Relief from Meds: 8  how many minutes can you walk? 20 mins ability to climb steps?  yes do you drive?  yes Do you have any goals in this area?  yes  retired I need assistance with the following:  meal prep, household duties and shopping Do you have any goals in this area?  yes  weakness numbness tremor tingling trouble walking depression  Any changes since last visit?  yes x-rays 10/2020 by Dr. Ronnald Ramp (lower back)  Any changes since last visit?  no New Patient    Family History  Problem Relation Age of Onset  . Cancer Mother        lung  . Macular degeneration Father   . Arthritis Father   . Lymphoma Son        died age 17  . Dementia Maternal Grandmother   . Stroke Maternal Grandmother   . Healthy Brother   . Cancer Maternal  Grandfather        esophageal cancer  . Cancer Paternal Grandmother        leukemia  . Healthy Brother   . Cancer Maternal Uncle        colon  . Cancer Maternal Aunt        esophageal cancer   Social History   Socioeconomic History  . Marital status: Married    Spouse name: Not on file  . Number of children: 1  . Years of education: Not on file  . Highest education level: Not on file  Occupational History  . Not on file  Tobacco Use  . Smoking status: Former Smoker    Quit date: 01/22/1975    Years since quitting: 45.8  . Smokeless tobacco: Never Used  Vaping Use  . Vaping Use: Never used  Substance and Sexual Activity  . Alcohol use: Yes    Alcohol/week: 3.0 standard drinks    Types: 3 Glasses of wine per week  . Drug use: No  . Sexual activity: Not Currently  Other Topics Concern  . Not on file  Social History Narrative   Regular exercise: Not walking   Works 3 days per week at [employer]   Caffeine use: yes   Social Determinants of Radio broadcast assistant Strain: Not  on file  Food Insecurity: Not on file  Transportation Needs: Not on file  Physical Activity: Not on file  Stress: Not on file  Social Connections: Not on file   Past Surgical History:  Procedure Laterality Date  . BREAST BIOPSY Left   . BREAST EXCISIONAL BIOPSY Right   . CARPAL TUNNEL RELEASE    . KNEE ARTHROSCOPY     right  . LIPOMA EXCISION  1965   axilary  . nissan fundiplication    . TONSILLECTOMY     Past Medical History:  Diagnosis Date  . Allergy   . ANEMIA, B12 DEFICIENCY 01/28/2009   Qualifier: Diagnosis of  By: Arnoldo Morale MD, Balinda Quails   . Anxiety   . Asthma   . GERD (gastroesophageal reflux disease)   . Hypertension   . Hypothyroidism   . OSTEOPOROSIS 05/06/2007   Qualifier: Diagnosis of  By: Arnoldo Morale MD, Balinda Quails   . Parathyroid hormone excess ([place]) 08/31/2013   Secondary to low dietary calcium/calcium malabsorption   . PLANTAR FASCIITIS, LEFT  07/05/2009   Qualifier: Diagnosis of  By: Arnoldo Morale MD, [place] 02/25/2009   Qualifier: Diagnosis of  By: Arnoldo Morale MD, Abelino Derrick   . UNSPECIFIED IRON DEFICIENCY ANEMIA 01/28/2009   Qualifier: Diagnosis of  By: Arnoldo Morale MD, Balinda Quails VENOUS INSUFFICIENCY, CHRONIC 04/28/2007   Qualifier: Diagnosis of  By: Sherren Mocha RN, [place]     BP 125/69   Pulse 78   Temp 98.4 F (36.9 C)   Ht 5\' 4"  (1.626 m)   Wt 225 lb (102.1 kg)   SpO2 95%   BMI 38.62 kg/m   Opioid Risk Score:   Fall Risk Score:  `1  Depression screen PHQ 2/9  Depression screen [facility] 2/9 12/01/2020 10/14/2020 04/12/2020 10/14/2019 03/16/2019 10/30/2018 05/01/2018  Decreased Interest 3 0 0 0 0 2 0  Down, Depressed, Hopeless 2 0 1 0 0 1 2  PHQ - 2 Score 5 0 1 0 0 3 2  Altered sleeping 3 0 1 1 1 3 2   Tired, decreased energy 2 0 1 1 1 3 2   Change in appetite 3 0 0 1 0 1 1  Feeling bad or failure about yourself  0 0 0 0 0 0 1  Trouble concentrating 3 0 0 0 1 2 0  Moving slowly or fidgety/restless 1 0 0 0 0 1 0  Suicidal thoughts 0 0 0 0 0 0 0  PHQ-9 Score 17 0 3 3 3 13 8   Difficult doing work/chores - - Not difficult at all Not difficult at all Not difficult at all Somewhat difficult Not difficult at all  Some recent data might be hidden   Review of Systems  Constitutional: Positive for unexpected weight change.       Weight gain  Respiratory: Positive for cough.   Cardiovascular: Positive for leg swelling.  Endocrine:       Night sweats  Musculoskeletal: Positive for back pain and gait problem.       Upper legs & buttock pain  Neurological: Positive for tremors, weakness and numbness.  Psychiatric/Behavioral:       Depression  All other systems reviewed and are negative.      Objective:   Physical Exam  Gen: no distress, normal appearing, BMI 38.62, weight 225lbs HEENT: oral mucosa pink and moist, NCAT Cardio: Reg rate Chest: normal effort, normal rate of breathing Abd: soft, non-distended Ext: no  edema Psych: pleasant, normal affect Skin: intact Neuro: Alert and oriented x3 Musculoskeletal: 5/5 strength throughout Lower back TTP  Worse with extension     Assessment & Plan:  Mrs. Leslie Edwards is a 75 year old woman who presents to establish care for lower back pain that radiates into bilateral lower extremities.  1) Lumbar spinal stenosis with neurogenic claudication, bilateral -Discussed current symptoms of pain and history of pain.  -Discussed benefits of exercise in reducing pain. -Prescribed Tizanidine 2mg  HS for muscle spasm. Discussed side effects -Prescribed PT for strengthening, balance.  -Discussed following foods that may reduce pain: 1) Ginger 2) Blueberries 3) Salmon 4) Pumpkin seeds 5) dark chocolate 6) turmeric 7) tart cherries 8) virgin olive oil 9) chilli peppers 10) mint 11) red wine 12) garlic  Link to further information on diet for chronic pain: [email or web address]   2) Obesity: -BMI 38.62, 225 lbs -discussed current diet and role of inflammation in pain -discussed what foods to avoid, and what foods to increase in diet -explained that dietary changes and improved mobility were key to recovery, and medications just a bandaid for pain

## 2020-12-01 NOTE — Patient Instructions (Signed)
-  Discussed following foods that may reduce pain: 1) Ginger 2) Blueberries 3) Salmon 4) Pumpkin seeds 5) dark chocolate 6) turmeric 7) tart cherries 8) virgin olive oil 9) chilli peppers 10) mint 11) red wine 12) garlic  Link to further information on diet for chronic pain: [email or web address]  

## 2020-12-02 ENCOUNTER — Encounter: Payer: Self-pay | Admitting: Physical Medicine and Rehabilitation

## 2020-12-08 ENCOUNTER — Telehealth: Payer: Self-pay | Admitting: Pharmacist

## 2020-12-08 NOTE — Progress Notes (Addendum)
Chronic Care Management Pharmacy Assistant   Name: Leslie Edwards  MRN: [id number] DOB: [birth year]   Reason for Encounter: Hypertension Disease State Call   Conditions to be addressed/monitored: HTN   Recent office visits:  10/14/20 Dr. Ronnald Ramp, started methylprednisolone and tizandine 11/09/20 Dr. Ronnald Ramp, started Irbesartan  Recent consult visits:  12/01/20 Dr. Ranell Patrick, changed tizandine to 1 tab at bedtime  Hospital visits:  None in previous 6 months  Medications: Outpatient Encounter Medications as of 12/08/2020  Medication Sig   albuterol (PROVENTIL HFA;VENTOLIN HFA) 108 (90 Base) MCG/ACT inhaler Inhale 2 puffs into the lungs every 6 (six) hours as needed.   allopurinol (ZYLOPRIM) 100 MG tablet TAKE 1 TABLET ONCE DAILY.   Ascorbic Acid (VITAMIN C) 1000 MG tablet Take 1,000 mg by mouth daily.   b complex vitamins tablet Take 1 tablet by mouth daily.   budesonide-formoterol (SYMBICORT) 160-4.5 MCG/ACT inhaler Inhale 2 puffs into the lungs 2 (two) times daily.   buPROPion (WELLBUTRIN XL) 150 MG 24 hr tablet Take 3 tablets (450 mg total) by mouth daily.   co-enzyme Q-10 30 MG capsule Take 30 mg by mouth 3 (three) times daily.   Colchicine (MITIGARE) 0.6 MG CAPS Take 1 tablet by mouth 2 (two) times daily.   famotidine (PEPCID) 10 MG tablet Take 10 mg by mouth 2 (two) times daily as needed for heartburn or indigestion.   hyoscyamine (LEVSIN SL) 0.125 MG SL tablet Place 1 tablet (0.125 mg total) under the tongue every 4 (four) hours as needed.   ipratropium-albuterol (DUONEB) 0.5-2.5 (3) MG/3ML SOLN Take 3 mLs by nebulization as needed.   irbesartan (AVAPRO) 150 MG tablet Take 1 tablet (150 mg total) by mouth daily.   levocetirizine (XYZAL) 5 MG tablet Take 2 tablets (10 mg total) by mouth every evening for 7 days.   liothyronine (CYTOMEL) 25 MCG tablet TAKE 1 TABLET ONCE DAILY.   Multiple Vitamin (MULTIVITAMIN) tablet Take 1 tablet by mouth daily.   omega-3 acid ethyl esters  (LOVAZA) 1 g capsule TAKE (2) CAPSULES BY MOUTH TWICE DAILY.   oxyCODONE (OXY IR/ROXICODONE) 5 MG immediate release tablet Take 1 tablet (5 mg total) by mouth every 6 (six) hours as needed for severe pain.   potassium chloride (KLOR-CON) 10 MEQ tablet Take 1 tablet (10 mEq total) by mouth daily.   rosuvastatin (CRESTOR) 10 MG tablet TAKE 1 TABLET ONCE DAILY.   tiZANidine (ZANAFLEX) 2 MG tablet Take 1 tablet (2 mg total) by mouth at bedtime.   torsemide (DEMADEX) 20 MG tablet TAKE 1 TABLET ONCE DAILY.   VIIBRYD 40 MG TABS TAKE 1 TABLET ONCE DAILY.   [DISCONTINUED] K-DUR 10 MEQ tablet TAKE 1 TABLET DAILY.   No facility-administered encounter medications on file as of 12/08/2020.    Pharmacist Review  Reviewed chart prior to disease state call. Spoke with patient regarding BP  Recent Office Vitals: BP Readings from Last 3 Encounters:  12/01/20 125/69  11/09/20 (!) 160/92  10/14/20 138/88   Pulse Readings from Last 3 Encounters:  12/01/20 78  11/09/20 69  10/14/20 66    Wt Readings from Last 3 Encounters:  12/01/20 225 lb (102.1 kg)  11/09/20 223 lb (101.2 kg)  10/14/20 222 lb (100.7 kg)     Kidney Function Lab Results  Component Value Date/Time   CREATININE 1.27 (H) 11/09/2020 10:18 AM   CREATININE 1.36 (H) 04/12/2020 10:26 AM   CREATININE 1.35 (H) 10/14/2019 11:55 AM   CREATININE 1.39 (H) 07/24/2013 04:45 PM  GFR 41.28 (L) 11/09/2020 10:18 AM   GFRNONAA 38 (L) 04/12/2020 10:26 AM   GFRAA 44 (L) 04/12/2020 10:26 AM    BMP Latest Ref Rng & Units 11/09/2020 11/09/2020 04/12/2020  Glucose 70 - 99 mg/dL - 100(H) 100(H)  BUN 6 - 23 mg/dL - 23 25  Creatinine 0.40 - 1.20 mg/dL - 1.27(H) 1.36(H)  BUN/Creat Ratio 6 - 22 (calc) - - 18  Sodium 135 - 145 mEq/L - 137 141  Potassium 3.5 - 5.1 mEq/L - 4.2 4.6  Chloride 96 - 112 mEq/L - 102 106  CO2 19 - 32 mEq/L - 28 26  Calcium 8.6 - 10.4 mg/dL 10.0 9.9 9.9    Current antihypertensive regimen: The patient is taking Irbesartan 150  mg daily  How often are you checking your Blood Pressure? The patient states that she does not take blood pressure on a regularly  Current home BP readings: The patient does not have any blood pressure reading  What recent interventions/DTPs have been made by any provider to improve Blood Pressure control since last CPP Visit: The patient states her blood pressure is under control with medication  Any recent hospitalizations or ED visits since last visit with CPP? The patient states that she has not had any hospital or ED visit  What diet changes have been made to improve Blood Pressure Control?  The patient states that she has not made any changes to her diet  What exercise is being done to improve your Blood Pressure Control?  The patient states that she can't exercise because she is in so much pain with arthritis   Adherence Review: Is the patient currently on ACE/ARB medication? Yes, Irbesartan 150 mg Does the patient have >5 day gap between last estimated fill dates? No, irbesartan was last filled on 11/09/20 for [place], [place] [phone]

## 2020-12-09 ENCOUNTER — Encounter: Payer: Self-pay | Admitting: Internal Medicine

## 2020-12-11 ENCOUNTER — Other Ambulatory Visit: Payer: Self-pay | Admitting: Internal Medicine

## 2020-12-11 DIAGNOSIS — M5412 Radiculopathy, cervical region: Secondary | ICD-10-CM

## 2020-12-11 DIAGNOSIS — M48062 Spinal stenosis, lumbar region with neurogenic claudication: Secondary | ICD-10-CM

## 2020-12-11 DIAGNOSIS — M5136 Other intervertebral disc degeneration, lumbar region: Secondary | ICD-10-CM

## 2020-12-11 DIAGNOSIS — M170 Bilateral primary osteoarthritis of knee: Secondary | ICD-10-CM

## 2020-12-11 DIAGNOSIS — M5441 Lumbago with sciatica, right side: Secondary | ICD-10-CM

## 2020-12-11 DIAGNOSIS — M5442 Lumbago with sciatica, left side: Secondary | ICD-10-CM

## 2020-12-11 MED ORDER — OXYCODONE HCL 5 MG PO TABS: 5.0000 mg | ORAL_TABLET | Freq: Four times a day (QID) | ORAL | 0 refills | Status: DC | PRN

## 2020-12-15 ENCOUNTER — Telehealth: Payer: Self-pay | Admitting: Pharmacist

## 2020-12-15 NOTE — Progress Notes (Signed)
    Chronic Care Management Pharmacy Assistant   Name: Leslie Edwards  MRN: [id number] DOB: 24-Dec-1945   Reason for Encounter: Chart Review    Medications: Outpatient Encounter Medications as of 12/15/2020  Medication Sig  . albuterol (PROVENTIL HFA;VENTOLIN HFA) 108 (90 Base) MCG/ACT inhaler Inhale 2 puffs into the lungs every 6 (six) hours as needed.  Marland Kitchen allopurinol (ZYLOPRIM) 100 MG tablet TAKE 1 TABLET ONCE DAILY.  Marland Kitchen Ascorbic Acid (VITAMIN C) 1000 MG tablet Take 1,000 mg by mouth daily.  Marland Kitchen b complex vitamins tablet Take 1 tablet by mouth daily.  . budesonide-formoterol (SYMBICORT) 160-4.5 MCG/ACT inhaler Inhale 2 puffs into the lungs 2 (two) times daily.  Marland Kitchen buPROPion (WELLBUTRIN XL) 150 MG 24 hr tablet Take 3 tablets (450 mg total) by mouth daily.  Marland Kitchen co-enzyme Q-10 30 MG capsule Take 30 mg by mouth 3 (three) times daily.  . Colchicine (MITIGARE) 0.6 MG CAPS Take 1 tablet by mouth 2 (two) times daily.  . famotidine (PEPCID) 10 MG tablet Take 10 mg by mouth 2 (two) times daily as needed for heartburn or indigestion.  . hyoscyamine (LEVSIN SL) 0.125 MG SL tablet Place 1 tablet (0.125 mg total) under the tongue every 4 (four) hours as needed.  Marland Kitchen ipratropium-albuterol (DUONEB) 0.5-2.5 (3) MG/3ML SOLN Take 3 mLs by nebulization as needed.  . irbesartan (AVAPRO) 150 MG tablet Take 1 tablet (150 mg total) by mouth daily.  Marland Kitchen levocetirizine (XYZAL) 5 MG tablet Take 2 tablets (10 mg total) by mouth every evening for 7 days.  Marland Kitchen liothyronine (CYTOMEL) 25 MCG tablet TAKE 1 TABLET ONCE DAILY.  . Multiple Vitamin (MULTIVITAMIN) tablet Take 1 tablet by mouth daily.  Marland Kitchen omega-3 acid ethyl esters (LOVAZA) 1 g capsule TAKE (2) CAPSULES BY MOUTH TWICE DAILY.  Marland Kitchen oxyCODONE (OXY IR/ROXICODONE) 5 MG immediate release tablet Take 1 tablet (5 mg total) by mouth every 6 (six) hours as needed for severe pain.  . potassium chloride (KLOR-CON) 10 MEQ tablet Take 1 tablet (10 mEq total) by mouth daily.  .  rosuvastatin (CRESTOR) 10 MG tablet TAKE 1 TABLET ONCE DAILY.  Marland Kitchen tiZANidine (ZANAFLEX) 2 MG tablet Take 1 tablet (2 mg total) by mouth at bedtime.  . torsemide (DEMADEX) 20 MG tablet TAKE 1 TABLET ONCE DAILY.  Marland Kitchen VIIBRYD 40 MG TABS TAKE 1 TABLET ONCE DAILY.  . [DISCONTINUED] K-DUR 10 MEQ tablet TAKE 1 TABLET DAILY.   No facility-administered encounter medications on file as of 12/15/2020.   Pharmacist Review:  Reviewed chart for medication changes and adherence.   No gaps in adherence identified. Patient has follow up scheduled with pharmacy team. No further action required.    [place] Pharmacist Assistant [phone]

## 2020-12-22 ENCOUNTER — Other Ambulatory Visit: Payer: Self-pay

## 2020-12-22 ENCOUNTER — Ambulatory Visit: Payer: Medicare Other | Attending: Physical Medicine and Rehabilitation

## 2020-12-22 DIAGNOSIS — M6281 Muscle weakness (generalized): Secondary | ICD-10-CM | POA: Diagnosis not present

## 2020-12-22 DIAGNOSIS — M545 Low back pain, unspecified: Secondary | ICD-10-CM | POA: Diagnosis not present

## 2020-12-22 DIAGNOSIS — R269 Unspecified abnormalities of gait and mobility: Secondary | ICD-10-CM | POA: Diagnosis not present

## 2020-12-22 NOTE — Therapy (Addendum)
[place], [place], [address] Phone: [phone]   Fax:  [phone]  Physical Therapy Evaluation  Patient Details  Name: Leslie Edwards MRN: [id number] Date of Birth: March 11, 1946 Referring Provider (PT): Ranell Patrick Clide Deutscher, MD   Encounter Date: 12/22/2020   PT End of Session - 12/22/20 1123    Visit Number 1    Number of Visits 25    Date for PT Re-Evaluation 03/16/21    Authorization Type [employer] MCR    Progress Note Due on Visit 10    PT Start Time 1130    PT Stop Time 1220    PT Time Calculation (min) 50 min    Activity Tolerance Patient tolerated treatment well;Patient limited by pain    Behavior During Therapy [facility] for tasks assessed/performed           Past Medical History:  Diagnosis Date  . Allergy   . ANEMIA, B12 DEFICIENCY 01/28/2009   Qualifier: Diagnosis of  By: Arnoldo Morale MD, Balinda Quails   . Anxiety   . Asthma   . GERD (gastroesophageal reflux disease)   . Hypertension   . Hypothyroidism   . OSTEOPOROSIS 05/06/2007   Qualifier: Diagnosis of  By: Arnoldo Morale MD, Balinda Quails   . Parathyroid hormone excess ([place]) 08/31/2013   Secondary to low dietary calcium/calcium malabsorption   . PLANTAR FASCIITIS, LEFT 07/05/2009   Qualifier: Diagnosis of  By: Arnoldo Morale MD, [place] 02/25/2009   Qualifier: Diagnosis of  By: Arnoldo Morale MD, Abelino Derrick   . UNSPECIFIED IRON DEFICIENCY ANEMIA 01/28/2009   Qualifier: Diagnosis of  By: Arnoldo Morale MD, Balinda Quails VENOUS INSUFFICIENCY, CHRONIC 04/28/2007   Qualifier: Diagnosis of  By: Scherrie Gerlach      Past Surgical History:  Procedure Laterality Date  . BREAST BIOPSY Left   . BREAST EXCISIONAL BIOPSY Right   . CARPAL TUNNEL RELEASE    . KNEE ARTHROSCOPY     right  . LIPOMA EXCISION  1965   axilary  . [employer] fundiplication    . TONSILLECTOMY      There were no vitals filed for this visit.    Subjective Assessment - 12/22/20 1132    Subjective Pt is a  pleasant 75 y/o F who presents to PT with acute-on-chronic LBP and and bilateral LE discomfort. She notes no MOI, just stated that she woke up one morning in January of 2022 was in excruciating LBP that referred to her bilateral posterior knees. She notes that heat and medications seem to releive her pain, while walking and standing increases pain and paresthesias. She does note that she has not had paresthesias in the last few weeks, but continues to promote significant pain in lower back.    Limitations Standing;Walking;Sitting    How long can you sit comfortably? 15 minutes    How long can you stand comfortably? 5 minutes    How long can you walk comfortably? 10 minutes    Patient Stated Goals Pt would like to decrease pain in lower back in order to improve comfort and get back to doing community activities she enjoys    Currently in Pain? Yes    Pain Score 6     Pain Location Back    Pain Orientation Right;Left;Posterior;Lower    Aggravating Factors  walking, standing - 9/10 at worst    Pain Relieving Factors heat, medications - 4/10  [facility] PT Assessment - 12/22/20 0001      Assessment   Medical Diagnosis M48.061 (ICD-10-CM) - Spinal stenosis of lumbar region without neurogenic claudication    Referring Provider (PT) Ranell Patrick, Clide Deutscher, MD    Next MD Visit 01/26/21      Precautions   Precautions None      Restrictions   Weight Bearing Restrictions No      Balance Screen   Has the patient fallen in the past 6 months Yes    How many times? once - recently fell walk wlaking down back steps to flower garden; no injuries    Has the patient had a decrease in activity level because of a fear of falling?  Yes    Is the patient reluctant to leave their home because of a fear of falling?  No      Home Ecologist residence    Living Arrangements Spouse/significant other    Type of [place] Two level    Alternate Level  Stairs-Number of Steps 5 STE w/ L sided handrail; 14 steps in home with L ascending handrail      Prior Function   Level of Independence Independent    Vocation Retired      Observation/Other Assessments   Focus on Therapeutic Outcomes (FOTO)  23% function      Sensation   Light Touch Appears Intact      Tone   Assessment Location --   Clonus Negative     ROM / Strength   AROM / PROM / Strength PROM;AROM;Strength      AROM   Lumbar Flexion 55    Lumbar Extension 10   pain   Lumbar - Right Side Bend 8    Lumbar - Left Side Bend 10      Strength   Right Hip Flexion 3/5    Right Hip ABduction 3/5    Right Hip ADduction 3/5    Left Hip Flexion 3/5    Left Hip ABduction 3/5    Right Knee Flexion 4-/5    Right Knee Extension 4-/5    Left Knee Flexion 4-/5    Left Knee Extension 4-/5    Right Ankle Dorsiflexion 4/5    Right Ankle Plantar Flexion 4+/5    Left Ankle Dorsiflexion 4/5    Left Ankle Plantar Flexion 4+/5      Palpation   Palpation comment TTP to bilateral lumbar paraspinals, PSIS, illiac crests      Transfers   Transfers Sit to Stand    Sit to Stand 4: Min assist    Sit to Stand Details --   required PT handhold assistance d/t sharp increase in LBP     Ambulation/Gait   Ambulation/Gait Yes    Ambulation/Gait Assistance 7: Independent    Ambulation Distance (Feet) 50 Feet    Gait Pattern Wide base of support;Antalgic    Ambulation Surface Level                      Objective measurements completed on examination: See above findings.       [facility] PT Treatment/Exercise - 12/22/20 0001      Lumbar Exercises: Stretches   Single Knee to Chest Stretch 30 seconds;Right;Left    Lower Trunk Rotation 10 seconds;5 reps      Lumbar Exercises: Supine   Pelvic Tilt 10 reps;5 seconds      Knee/Hip Exercises:  Supine   Hip Adduction Isometric 10 reps;Both    Other Supine Knee/Hip Exercises Supine clamshell RTB x 10                   PT Education - 12/22/20 1240    Education Details education on current condition, POC, HEP, and prognosis    Person(s) Educated Patient    Methods Explanation;Demonstration;Handout    Comprehension Verbalized understanding;Returned demonstration            PT Short Term Goals - 12/22/20 1317      PT SHORT TERM GOAL #1   Title Pt will be compliant and knowledgeable with 90% of HEP in order to improve carryover    Baseline Initial HEP given    Time 3    Period Weeks    Status New    Target Date 01/12/21      PT SHORT TERM GOAL #2   Title Pt will be able to perform sit>stand transfer independently with no increase in LBP or UE support in order to improve functional mobility    Baseline Min A    Time 3    Period Weeks    Status New    Target Date 01/12/21             PT Long Term Goals - 12/22/20 1318      PT LONG TERM GOAL #1   Title Pt will increase FOTO functional score to no less than 46% in order to improve confidence and functional ability    Baseline 23% functional score at evaluation    Time 12    Period Weeks    Status New    Target Date 03/16/21      PT LONG TERM GOAL #2   Title Pt will increase standing tolerance to 30 minutes in order to improve ADL performance and functional ability    Baseline 5 minutes at present    Time 12    Period Weeks    Status New    Target Date 03/16/21      PT LONG TERM GOAL #3   Title Pt will be able to ambulate 1056ft with no increase in LBP in order to improve comfort and functional mobility with community activities    Baseline pain with general ambulation at 48ft    Time 12    Period Weeks    Status New    Target Date 03/16/21      PT LONG TERM GOAL #4   Title Pt will self report LBP no greater than 3/10 at worst in order to improve comfort and functional ability    Baseline 9/10 at worst    Time 12    Period Weeks    Status New    Target Date 03/16/21                  Plan -  12/22/20 1248    Clinical Impression Statement Pt is a pleasant 75 y/o F who presents to PT with acute-on-chronic LBP and and bilateral LE discomfort. Physical findings are consistent with MD impression as pt is positive or 4/5 items in lumbar stenosis cluster and demonstrates decreased strength and functional mobility secondary to LBP. Her LE MMT was limited by LBP, as she quickly gave way with resistance during testing. She also demonstrates marked decrease in funcitonal transfer ability, requiring min assistance and may VCs for sit to stand transfer. Pt would benefit from skilled PT services working on improve core  and proximal hip strength and lumbar mobility in order to decrease pain and impove function.    Personal Factors and Comorbidities Age;Comorbidity 2    Comorbidities See PMH    Examination-Activity Limitations Squat;Stairs;Stand;Sit;Carry;Bend;Transfers    Examination-Participation Restrictions Shop;Yard Work;Community Activity    Stability/Clinical Decision Making Stable/Uncomplicated    Clinical Decision Making Low    Rehab Potential Good    PT Frequency 2x / week    PT Duration 12 weeks    PT Treatment/Interventions ADLs/Self Care Home Management;Electrical Stimulation;Moist Heat;Ultrasound;Traction;Gait training;Stair training;Therapeutic activities;Therapeutic exercise;Neuromuscular re-education;Balance training;Patient/family education;Manual techniques;Passive range of motion;Dry needling;Spinal Manipulations;Joint Manipulations    PT Next Visit Plan Assess balance and transfers such as sit to stand; assess response to HEP and continue to progress exercises as tolerated    PT Home Exercise Plan Access Code [email or web address]    Consulted and Agree with Plan of Care Patient           Patient will benefit from skilled therapeutic intervention in order to improve the following deficits and impairments:  Abnormal gait,Decreased activity tolerance,Decreased balance,Decreased  endurance,Decreased range of motion,Decreased strength,Difficulty walking,Pain  Visit Diagnosis: Muscle weakness (generalized) - Plan: PT plan of care cert/re-cert  Impaired gait - Plan: PT plan of care cert/re-cert  Low back pain, non-specific - Plan: PT plan of care cert/re-cert     Problem List Patient Active Problem List   Diagnosis Date Noted  . Encounter for general adult medical examination with abnormal findings 11/09/2020  . DDD (degenerative disc disease), lumbar 10/14/2020  . Hyperlipidemia with target LDL less than 100 10/15/2019  . Routine general medical examination at a health care facility 10/14/2019  . Chronic gout due to renal impairment of multiple sites without tophus 04/13/2019  . LVH (left ventricular hypertrophy) due to hypertensive disease, with heart failure ([place]) 11/20/2018  . Diastolic dysfunction with chronic heart failure ([place]) 11/20/2018  . Esophageal dysphagia 10/30/2018  . Primary osteoarthritis of both knees 08/28/2016  . Radiculitis of right cervical region 07/30/2016  . Ocular rosacea 12/21/2015  . Pure hyperglyceridemia 11/01/2015  . Nonspecific abnormal electrocardiogram (ECG) (EKG) 10/31/2015  . Nuclear sclerosis of both eyes 01/05/2015  . Heart murmur, systolic 09/98/3382  . Depression with anxiety 07/22/2014  . Asthma, intermittent 06/03/2014  . Visit for screening mammogram 06/03/2014  . Spinal stenosis, lumbar region, with neurogenic claudication 06/03/2014  . Kidney disease, chronic, stage IV (GFR 15-29 ml/min) ([place]) 06/03/2014  . Parathyroid hormone excess ([place]) 08/31/2013  . Celiac disease 08/18/2007  . Essential hypertension 05/06/2007  . Allergic rhinitis 05/06/2007  . OSTEOPOROSIS 05/06/2007  . Other specified hypothyroidism 04/28/2007  . Morbid obesity ([place]) 04/28/2007  . GERD 04/28/2007     Ward Chatters, PT, DPT 12/22/20 1:27 PM  [facility] [address] [place], [place], [address] Phone: [phone]   Fax:  [phone]  Name: GEORGENE KOPPER MRN: [id number] Date of Birth: [birth year]

## 2020-12-26 ENCOUNTER — Ambulatory Visit: Payer: Medicare Other

## 2020-12-26 ENCOUNTER — Other Ambulatory Visit: Payer: Self-pay

## 2020-12-26 DIAGNOSIS — R269 Unspecified abnormalities of gait and mobility: Secondary | ICD-10-CM | POA: Diagnosis not present

## 2020-12-26 DIAGNOSIS — M545 Low back pain, unspecified: Secondary | ICD-10-CM

## 2020-12-26 DIAGNOSIS — M6281 Muscle weakness (generalized): Secondary | ICD-10-CM | POA: Diagnosis not present

## 2020-12-26 NOTE — Therapy (Signed)
[place], [place], [address] Phone: [phone]   Fax:  [phone]  Physical Therapy Treatment  Patient Details  Name: Leslie Edwards MRN: [id number] Date of Birth: Mar 07, 1946 Referring Provider (PT): Ranell Patrick Clide Deutscher, MD   Encounter Date: 12/26/2020   PT End of Session - 12/26/20 1051    Visit Number 2    Number of Visits 25    Date for PT Re-Evaluation 03/16/21    Authorization Type BCBS MCR    Progress Note Due on Visit 10    PT Start Time 1046    PT Stop Time 1130    PT Time Calculation (min) 44 min    Activity Tolerance Patient tolerated treatment well;Patient limited by pain    Behavior During Therapy [facility] for tasks assessed/performed           Past Medical History:  Diagnosis Date  . Allergy   . ANEMIA, B12 DEFICIENCY 01/28/2009   Qualifier: Diagnosis of  By: Arnoldo Morale MD, Balinda Quails   . Anxiety   . Asthma   . GERD (gastroesophageal reflux disease)   . Hypertension   . Hypothyroidism   . OSTEOPOROSIS 05/06/2007   Qualifier: Diagnosis of  By: Arnoldo Morale MD, Balinda Quails   . Parathyroid hormone excess ([place]) 08/31/2013   Secondary to low dietary calcium/calcium malabsorption   . PLANTAR FASCIITIS, LEFT 07/05/2009   Qualifier: Diagnosis of  By: Arnoldo Morale MD, [place] 02/25/2009   Qualifier: Diagnosis of  By: Arnoldo Morale MD, Abelino Derrick   . UNSPECIFIED IRON DEFICIENCY ANEMIA 01/28/2009   Qualifier: Diagnosis of  By: Arnoldo Morale MD, Balinda Quails VENOUS INSUFFICIENCY, CHRONIC 04/28/2007   Qualifier: Diagnosis of  By: Scherrie Gerlach      Past Surgical History:  Procedure Laterality Date  . BREAST BIOPSY Left   . BREAST EXCISIONAL BIOPSY Right   . CARPAL TUNNEL RELEASE    . KNEE ARTHROSCOPY     right  . LIPOMA EXCISION  1965   axilary  . nissan fundiplication    . TONSILLECTOMY      There were no vitals filed for this visit.   Subjective Assessment - 12/26/20 1051    Subjective Pt  presents to PT with reports of continued LBP and discomfort. She also reports R rib flank pain after feeling like she pulled a muscle while coughing. She notes that initial exercises went well with no adverse effect. Pt is ready to begin PT treatment at this time.    Currently in Pain? Yes    Pain Score 5     Pain Location Back    Pain Orientation Lower    Multiple Pain Sites Yes    Pain Score 3    Pain Location Other (Comment)   R rib/flank   Pain Orientation Right    Pain Descriptors / Indicators Sharp    Pain Type Acute pain    Pain Onset In the past 7 days    Aggravating Factors  coughing                             [facility] Adult PT Treatment/Exercise - 12/26/20 0001      Exercises   Exercises Lumbar      Lumbar Exercises: Stretches   Lower Trunk Rotation --   x 10 ea     Lumbar Exercises: Aerobic   Nustep  lvl 5 UE/LE x 5 min while taking subjective      Lumbar Exercises: Seated   Long Arc Quad on Chair 2 sets;10 reps    LAQ on Chair Weights (lbs) 2lbs    Sit to Stand 5 reps   sitting on 4in step w/ UE support     Lumbar Exercises: Supine   Pelvic Tilt 10 reps;5 seconds    Pelvic Tilt Limitations Supine PPT w/ ball squeeze 2x10 - 5 sec hold    Other Supine Lumbar Exercises Supine PPT w/ march 2x5      Knee/Hip Exercises: Supine   Other Supine Knee/Hip Exercises Supine clamshell 2x15 blue tband                    PT Short Term Goals - 12/22/20 1317      PT SHORT TERM GOAL #1   Title Pt will be compliant and knowledgeable with 90% of HEP in order to improve carryover    Baseline Initial HEP given    Time 3    Period Weeks    Status New    Target Date 01/12/21      PT SHORT TERM GOAL #2   Title Pt will be able to perform sit>stand transfer independently with no increase in LBP or UE support in order to improve functional mobility    Baseline Min A    Time 3    Period Weeks    Status New    Target Date 01/12/21             PT  Long Term Goals - 12/22/20 1318      PT LONG TERM GOAL #1   Title Pt will increase FOTO functional score to no less than 46% in order to improve confidence and functional ability    Baseline 23% functional score at evaluation    Time 12    Period Weeks    Status New    Target Date 03/16/21      PT LONG TERM GOAL #2   Title Pt will increase standing tolerance to 30 minutes in order to improve ADL performance and functional ability    Baseline 5 minutes at present    Time 12    Period Weeks    Status New    Target Date 03/16/21      PT LONG TERM GOAL #3   Title Pt will be able to ambulate 1045ft with no increase in LBP in order to improve comfort and functional mobility with community activities    Baseline pain with general ambulation at 28ft    Time 12    Period Weeks    Status New    Target Date 03/16/21      PT LONG TERM GOAL #4   Title Pt will self report LBP no greater than 3/10 at worst in order to improve comfort and functional ability    Baseline 9/10 at worst    Time 12    Period Weeks    Status New    Target Date 03/16/21                 Plan - 12/26/20 1116    Clinical Impression Statement Pt was once again able to complete all prescribed exercises with no adverse effect. She continues to demonstrate proximal hip and general LE weakness that limits her ability to perform transfers and other required mobility tasks. Pt continues to benefit from skilled PT services and should continue  to be seen per POC as prescribed.    PT Treatment/Interventions ADLs/Self Care Home Management;Electrical Stimulation;Moist Heat;Ultrasound;Traction;Gait training;Stair training;Therapeutic activities;Therapeutic exercise;Neuromuscular re-education;Balance training;Patient/family education;Manual techniques;Passive range of motion;Dry needling;Spinal Manipulations;Joint Manipulations    PT Next Visit Plan continue to work on sit to stand transfer and progression of strengthening  exercise as able    PT Home Exercise Plan Access Code [place]           Patient will benefit from skilled therapeutic intervention in order to improve the following deficits and impairments:  Abnormal gait,Decreased activity tolerance,Decreased balance,Decreased endurance,Decreased range of motion,Decreased strength,Difficulty walking,Pain  Visit Diagnosis: Muscle weakness (generalized)  Impaired gait  Low back pain, non-specific     Problem List Patient Active Problem List   Diagnosis Date Noted  . Encounter for general adult medical examination with abnormal findings 11/09/2020  . DDD (degenerative disc disease), lumbar 10/14/2020  . Hyperlipidemia with target LDL less than 100 10/15/2019  . Routine general medical examination at a health care facility 10/14/2019  . Chronic gout due to renal impairment of multiple sites without tophus 04/13/2019  . LVH (left ventricular hypertrophy) due to hypertensive disease, with heart failure ([place]) 11/20/2018  . Diastolic dysfunction with chronic heart failure ([place]) 11/20/2018  . Esophageal dysphagia 10/30/2018  . Primary osteoarthritis of both knees 08/28/2016  . Radiculitis of right cervical region 07/30/2016  . Ocular rosacea 12/21/2015  . Pure hyperglyceridemia 11/01/2015  . Nonspecific abnormal electrocardiogram (ECG) (EKG) 10/31/2015  . Nuclear sclerosis of both eyes 01/05/2015  . Heart murmur, systolic 59/74/1638  . Depression with anxiety 07/22/2014  . Asthma, intermittent 06/03/2014  . Visit for screening mammogram 06/03/2014  . Spinal stenosis, lumbar region, with neurogenic claudication 06/03/2014  . Kidney disease, chronic, stage IV (GFR 15-29 ml/min) ([place]) 06/03/2014  . Parathyroid hormone excess ([place]) 08/31/2013  . Celiac disease 08/18/2007  . Essential hypertension 05/06/2007  . Allergic rhinitis 05/06/2007  . OSTEOPOROSIS 05/06/2007  . Other specified hypothyroidism 04/28/2007  . Morbid obesity ([place]) 04/28/2007   . GERD 04/28/2007    Ward Chatters, PT, DPT 12/26/20 11:34 AM  [facility] [address] [place], [place], [address] Phone: [phone]   Fax:  [phone]  Name: Leslie Edwards MRN: [id number] Date of Birth: [birth year]

## 2020-12-28 ENCOUNTER — Ambulatory Visit: Payer: Medicare Other

## 2020-12-28 ENCOUNTER — Telehealth: Payer: Self-pay

## 2020-12-28 NOTE — Telephone Encounter (Signed)
PT called and spoke with patient regarding missed appointment. Patient got her appointment days mixed up and stated she would be at next appointment.

## 2021-01-02 ENCOUNTER — Other Ambulatory Visit: Payer: Self-pay | Admitting: Internal Medicine

## 2021-01-02 DIAGNOSIS — E038 Other specified hypothyroidism: Secondary | ICD-10-CM

## 2021-01-02 DIAGNOSIS — F418 Other specified anxiety disorders: Secondary | ICD-10-CM

## 2021-01-02 DIAGNOSIS — I10 Essential (primary) hypertension: Secondary | ICD-10-CM

## 2021-01-04 ENCOUNTER — Ambulatory Visit: Payer: Medicare Other | Admitting: Physical Medicine and Rehabilitation

## 2021-01-10 ENCOUNTER — Ambulatory Visit: Payer: Medicare Other

## 2021-01-10 ENCOUNTER — Telehealth: Payer: Self-pay | Admitting: Pharmacist

## 2021-01-10 ENCOUNTER — Other Ambulatory Visit: Payer: Self-pay

## 2021-01-10 ENCOUNTER — Ambulatory Visit (INDEPENDENT_AMBULATORY_CARE_PROVIDER_SITE_OTHER): Payer: Medicare Other | Admitting: Pharmacist

## 2021-01-10 DIAGNOSIS — E785 Hyperlipidemia, unspecified: Secondary | ICD-10-CM

## 2021-01-10 DIAGNOSIS — M85859 Other specified disorders of bone density and structure, unspecified thigh: Secondary | ICD-10-CM

## 2021-01-10 DIAGNOSIS — R269 Unspecified abnormalities of gait and mobility: Secondary | ICD-10-CM

## 2021-01-10 DIAGNOSIS — M545 Low back pain, unspecified: Secondary | ICD-10-CM

## 2021-01-10 DIAGNOSIS — M6281 Muscle weakness (generalized): Secondary | ICD-10-CM | POA: Diagnosis not present

## 2021-01-10 DIAGNOSIS — I10 Essential (primary) hypertension: Secondary | ICD-10-CM | POA: Diagnosis not present

## 2021-01-10 DIAGNOSIS — J4520 Mild intermittent asthma, uncomplicated: Secondary | ICD-10-CM | POA: Diagnosis not present

## 2021-01-10 DIAGNOSIS — I5032 Chronic diastolic (congestive) heart failure: Secondary | ICD-10-CM

## 2021-01-10 DIAGNOSIS — F418 Other specified anxiety disorders: Secondary | ICD-10-CM

## 2021-01-10 NOTE — Therapy (Signed)
[place], [place], [address] Phone: [phone]   Fax:  [phone]  Physical Therapy Treatment  Patient Details  Name: Leslie Edwards MRN: [id number] Date of Birth: 04/21/1946 Referring Provider (PT): Ranell Patrick Clide Deutscher, MD   Encounter Date: 01/10/2021   PT End of Session - 01/10/21 0925    Visit Number 3    Number of Visits 25    Date for PT Re-Evaluation 03/16/21    Authorization Type BCBS MCR    Progress Note Due on Visit 10    PT Start Time 0930    PT Stop Time 1014    PT Time Calculation (min) 44 min    Activity Tolerance Patient tolerated treatment well    Behavior During Therapy [facility] for tasks assessed/performed           Past Medical History:  Diagnosis Date  . Allergy   . ANEMIA, B12 DEFICIENCY 01/28/2009   Qualifier: Diagnosis of  By: Arnoldo Morale MD, Balinda Quails   . Anxiety   . Asthma   . GERD (gastroesophageal reflux disease)   . Hypertension   . Hypothyroidism   . OSTEOPOROSIS 05/06/2007   Qualifier: Diagnosis of  By: Arnoldo Morale MD, Balinda Quails   . Parathyroid hormone excess ([place]) 08/31/2013   Secondary to low dietary calcium/calcium malabsorption   . PLANTAR FASCIITIS, LEFT 07/05/2009   Qualifier: Diagnosis of  By: Arnoldo Morale MD, [place] 02/25/2009   Qualifier: Diagnosis of  By: Arnoldo Morale MD, Abelino Derrick   . UNSPECIFIED IRON DEFICIENCY ANEMIA 01/28/2009   Qualifier: Diagnosis of  By: Arnoldo Morale MD, Balinda Quails VENOUS INSUFFICIENCY, CHRONIC 04/28/2007   Qualifier: Diagnosis of  By: Scherrie Gerlach      Past Surgical History:  Procedure Laterality Date  . BREAST BIOPSY Left   . BREAST EXCISIONAL BIOPSY Right   . CARPAL TUNNEL RELEASE    . KNEE ARTHROSCOPY     right  . LIPOMA EXCISION  1965   axilary  . nissan fundiplication    . TONSILLECTOMY      There were no vitals filed for this visit.   Subjective Assessment - 01/10/21 0932    Subjective Patient reports her back still  hurts, but a little better than her last appointment. Not as much constant pain. She reports compliance with HEP.    Currently in Pain? Yes    Pain Score 4     Pain Location Back    Pain Orientation Lower    Pain Descriptors / Indicators Aching    Pain Type Chronic pain    Pain Onset More than a month ago    Pain Frequency Intermittent    Pain Onset In the past 7 days                             [facility] Adult PT Treatment/Exercise - 01/10/21 0001      Self-Care   Self-Care Other Self-Care Comments    Other Self-Care Comments  see patient education      Lumbar Exercises: Stretches   Double Knee to Chest Stretch Limitations 10 reps with min A    Lower Trunk Rotation 5 reps      Lumbar Exercises: Seated   Long Arc Quad on Chair 15 reps;2 sets    LAQ on Chair Weights (lbs) 2    Sit to Stand --  8 reps raised height CGA with ball in hands   Other Seated Lumbar Exercises trunk flexion 10 reps      Lumbar Exercises: Supine   Pelvic Tilt 5 reps    Pelvic Tilt Limitations PPT    Bent Knee Raise 10 reps    Bent Knee Raise Limitations x2 with PPT    Bridge 10 reps    Bridge Limitations x2; partial range    Other Supine Lumbar Exercises hip adduction isometric adduction 5 reps      Lumbar Exercises: Sidelying   Clam 15 reps    Clam Limitations x2 bilateral                  PT Education - 01/10/21 1019    Education Details Updated HEP.    Person(s) Educated Patient    Methods Explanation;Demonstration;Verbal cues;Handout    Comprehension Verbalized understanding;Returned demonstration;Verbal cues required            PT Short Term Goals - 01/10/21 0945      PT SHORT TERM GOAL #1   Title Pt will be compliant and knowledgeable with 90% of HEP in order to improve carryover    Time 3    Period Weeks    Status Achieved    Target Date 01/12/21      PT SHORT TERM GOAL #2   Title Pt will be able to perform sit>stand transfer independently with no  increase in LBP or UE support in order to improve functional mobility    Baseline Mod I  with use of BUE; CGA from raised height and no UE support    Time 3    Period Weeks    Status On-going    Target Date 01/12/21             PT Long Term Goals - 12/22/20 1318      PT LONG TERM GOAL #1   Title Pt will increase FOTO functional score to no less than 46% in order to improve confidence and functional ability    Baseline 23% functional score at evaluation    Time 12    Period Weeks    Status New    Target Date 03/16/21      PT LONG TERM GOAL #2   Title Pt will increase standing tolerance to 30 minutes in order to improve ADL performance and functional ability    Baseline 5 minutes at present    Time 12    Period Weeks    Status New    Target Date 03/16/21      PT LONG TERM GOAL #3   Title Pt will be able to ambulate 1022f with no increase in LBP in order to improve comfort and functional mobility with community activities    Baseline pain with general ambulation at 526f   Time 12    Period Weeks    Status New    Target Date 03/16/21      PT LONG TERM GOAL #4   Title Pt will self report LBP no greater than 3/10 at worst in order to improve comfort and functional ability    Baseline 9/10 at worst    Time 12    Period Weeks    Status New    Target Date 03/16/21                 Plan - 01/10/21 0934    Clinical Impression Statement Patient tolerated session well today without increased low  back pain. She reports she feels that she is finally starting to make some functional progress. She demonstrates indepedence with initial HEP, having met this short term goal. She still has difficulty with sit<> stand transfers requiring significant use of BUE support to complete independently. She was able to perform sit <>stand from raised height without UE support requiring CGA. Able to progress core and hip strengthening with HEP updated to include today's ther ex.    PT  Treatment/Interventions ADLs/Self Care Home Management;Electrical Stimulation;Moist Heat;Ultrasound;Traction;Gait training;Stair training;Therapeutic activities;Therapeutic exercise;Neuromuscular re-education;Balance training;Patient/family education;Manual techniques;Passive range of motion;Dry needling;Spinal Manipulations;Joint Manipulations    PT Next Visit Plan review HEP. continue to work on sit to stand transfer and progression of strengthening exercise as able    PT Home Exercise Plan Access Code [place] and Agree with Plan of Care Patient           Patient will benefit from skilled therapeutic intervention in order to improve the following deficits and impairments:  Abnormal gait,Decreased activity tolerance,Decreased balance,Decreased endurance,Decreased range of motion,Decreased strength,Difficulty walking,Pain  Visit Diagnosis: Muscle weakness (generalized)  Impaired gait  Low back pain, non-specific     Problem List Patient Active Problem List   Diagnosis Date Noted  . Encounter for general adult medical examination with abnormal findings 11/09/2020  . DDD (degenerative disc disease), lumbar 10/14/2020  . Hyperlipidemia with target LDL less than 100 10/15/2019  . Routine general medical examination at a health care facility 10/14/2019  . Chronic gout due to renal impairment of multiple sites without tophus 04/13/2019  . LVH (left ventricular hypertrophy) due to hypertensive disease, with heart failure ([place]) 11/20/2018  . Diastolic dysfunction with chronic heart failure ([place]) 11/20/2018  . Esophageal dysphagia 10/30/2018  . Primary osteoarthritis of both knees 08/28/2016  . Radiculitis of right cervical region 07/30/2016  . Ocular rosacea 12/21/2015  . Pure hyperglyceridemia 11/01/2015  . Nonspecific abnormal electrocardiogram (ECG) (EKG) 10/31/2015  . Nuclear sclerosis of both eyes 01/05/2015  . Heart murmur, systolic 09/25/3233  . Depression with  anxiety 07/22/2014  . Asthma, intermittent 06/03/2014  . Visit for screening mammogram 06/03/2014  . Spinal stenosis, lumbar region, with neurogenic claudication 06/03/2014  . Kidney disease, chronic, stage IV (GFR 15-29 ml/min) ([place]) 06/03/2014  . Parathyroid hormone excess ([place]) 08/31/2013  . Celiac disease 08/18/2007  . Essential hypertension 05/06/2007  . Allergic rhinitis 05/06/2007  . OSTEOPOROSIS 05/06/2007  . Other specified hypothyroidism 04/28/2007  . Morbid obesity ([place]) 04/28/2007  . GERD 04/28/2007   Gwendolyn Grant, PT, DPT, ATC 01/10/21 10:21 AM  [facility] [address] [place], [place], [address] Phone: [phone]   Fax:  [phone]  Name: Leslie Edwards MRN: [id number] Date of Birth: Apr 11, 1946

## 2021-01-10 NOTE — Progress Notes (Signed)
Chronic Care Management Pharmacy Note  01/11/2021 Name:  Leslie Edwards MRN:  [id number] DOB:  [birth year]  Subjective: Leslie Edwards is an 75 y.o. year old female who is a primary patient of Janith Lima, MD.  The CCM team was consulted for assistance with disease management and care coordination needs.    Engaged with patient by telephone for follow up visit in response to provider referral for pharmacy case management and/or care coordination services.   Consent to Services:  The patient was given information about Chronic Care Management services, agreed to services, and gave verbal consent prior to initiation of services.  Please see initial visit note for detailed documentation.   Patient Care Team: Janith Lima, MD as PCP - General (Internal Medicine) Charlton Haws, [facility] as Pharmacist (Pharmacist)  Recent office visits: 11/09/20 Dr Ronnald Ramp OV: chronic f/u. Rx'd irbesartan 150 mg for BP (160/92). Referred to pain mgmt. Stop levothyroxine and cytomel due to TSH 0.15.  10/14/20 Dr Ronnald Ramp OV: f/u back pain. Rx'd metylpred and tizanidine.  Recent consult visits: PT for muscle weakness/instability. 12/01/20 Dr Adam Phenix (phys med): f/u spinal stenosis. Referred to PT.  Hospital visits: None in previous 6 months  Objective:  Lab Results  Component Value Date   CREATININE 1.27 (H) 11/09/2020   BUN 23 11/09/2020   GFR 41.28 (L) 11/09/2020   GFRNONAA 38 (L) 04/12/2020   GFRAA 44 (L) 04/12/2020   NA 137 11/09/2020   K 4.2 11/09/2020   CALCIUM 10.0 11/09/2020   CALCIUM 9.9 11/09/2020   CO2 28 11/09/2020   GLUCOSE 100 (H) 11/09/2020    Lab Results  Component Value Date/Time   HGBA1C 5.3 04/28/2009 12:03 PM   GFR 41.28 (L) 11/09/2020 10:18 AM   GFR 38.26 (L) 10/14/2019 11:55 AM    Last diabetic Eye exam:  Lab Results  Component Value Date/Time   HMDIABEYEEXA No Retinopathy 04/28/2018 12:00 AM    Last diabetic Foot exam: No results found for: HMDIABFOOTEX    Lab Results  Component Value Date   CHOL 198 11/09/2020   HDL 91.80 11/09/2020   LDLCALC 73 11/09/2020   LDLDIRECT 107.0 10/14/2019   TRIG 166.0 (H) 11/09/2020   CHOLHDL 2 11/09/2020    Hepatic Function Latest Ref Rng & Units 11/09/2020 10/30/2018 10/21/2017  Total Protein 6.0 - 8.3 g/dL 7.3 7.0 7.2  Albumin 3.5 - 5.2 g/dL 4.5 4.6 4.6  AST 0 - 37 U/L '25 18 15  ' ALT 0 - 35 U/L 32 17 17  Alk Phosphatase 39 - 117 U/L 76 59 55  Total Bilirubin 0.2 - 1.2 mg/dL 0.8 0.7 0.5  Bilirubin, Direct 0.0 - 0.3 mg/dL 0.2 - -    Lab Results  Component Value Date/Time   TSH 0.15 (L) 11/09/2020 10:18 AM   TSH 0.60 04/12/2020 10:26 AM   FREET4 0.86 10/31/2015 03:05 PM   FREET4 0.89 11/06/2013 11:14 AM    CBC Latest Ref Rng & Units 11/09/2020 10/14/2019 10/30/2018  WBC 4.0 - 10.5 K/uL 6.4 6.5 5.9  Hemoglobin 12.0 - 15.0 g/dL 13.1 13.3 13.2  Hematocrit 36.0 - 46.0 % 38.3 39.3 38.7  Platelets 150.0 - 400.0 K/uL 264.0 293.0 237.0    Lab Results  Component Value Date/Time   VD25OH 34.42 10/14/2019 11:55 AM   VD25OH 67.69 04/17/2017 09:58 AM    Clinical ASCVD: No  The 10-year ASCVD risk score Mikey Bussing DC Jr., et al., 2013) is: 22.9%   Values used to calculate the score:  Age: 1 years     Sex: Female     Is Non-Hispanic African American: No     Diabetic: No     Tobacco smoker: No     Systolic Blood Pressure: 001 mmHg     Is BP treated: Yes     HDL Cholesterol: 91.8 mg/dL     Total Cholesterol: 198 mg/dL    Depression screen [facility] 2/9 12/01/2020 10/14/2020 04/12/2020  Decreased Interest 3 0 0  Down, Depressed, Hopeless 2 0 1  PHQ - 2 Score 5 0 1  Altered sleeping 3 0 1  Tired, decreased energy 2 0 1  Change in appetite 3 0 0  Feeling bad or failure about yourself  0 0 0  Trouble concentrating 3 0 0  Moving slowly or fidgety/restless 1 0 0  Suicidal thoughts 0 0 0  PHQ-9 Score 17 0 3  Difficult doing work/chores - - Not difficult at all  Some recent data might be hidden    GAD 7 :  Generalized Anxiety Score 04/17/2017  Nervous, Anxious, on Edge 2  Control/stop worrying 3  Worry too much - different things 3  Trouble relaxing 2  Restless 1  Easily annoyed or irritable 2  Afraid - awful might happen 3  Total GAD 7 Score 16       Social History   Tobacco Use  Smoking Status Former Smoker  . Quit date: 01/22/1975  . Years since quitting: 46.0  Smokeless Tobacco Never Used   BP Readings from Last 3 Encounters:  12/01/20 125/69  11/09/20 (!) 160/92  10/14/20 138/88   Pulse Readings from Last 3 Encounters:  12/01/20 78  11/09/20 69  10/14/20 66   Wt Readings from Last 3 Encounters:  12/01/20 225 lb (102.1 kg)  11/09/20 223 lb (101.2 kg)  10/14/20 222 lb (100.7 kg)   BMI Readings from Last 3 Encounters:  12/01/20 38.62 kg/m  11/09/20 38.28 kg/m  10/14/20 38.11 kg/m   Assessment/Interventions: Review of patient past medical history, allergies, medications, health status, including review of consultants reports, laboratory and other test data, was performed as part of comprehensive evaluation and provision of chronic care management services.   SDOH:  (Social Determinants of Health) assessments and interventions performed: Yes  SDOH Screenings   Alcohol Screen: Not on file  Depression (PHQ2-9): Medium Risk  . PHQ-2 Score: 17  Financial Resource Strain: Not on file  Food Insecurity: Not on file  Housing: Not on file  Physical Activity: Not on file  Social Connections: Not on file  Stress: Not on file  Tobacco Use: Medium Risk  . Smoking Tobacco Use: Former Smoker  . Smokeless Tobacco Use: Never Used  Transportation Needs: Not on file    CCM Care Plan  Allergies  Allergen Reactions  . Penicillins     REACTION: difficulty breathing  . Prochlorperazine Edisylate     Medications Reviewed Today    Reviewed by Charlton Haws, [facility] (Pharmacist) on 01/11/21 at 1214  Med List Status: <None>  Medication Order Taking? Sig Documenting  Provider Last Dose Status Informant  albuterol (PROVENTIL HFA;VENTOLIN HFA) 108 (90 Base) MCG/ACT inhaler [id number] Yes Inhale 2 puffs into the lungs every 6 (six) hours as needed. Janith Lima, MD Taking Active   allopurinol (ZYLOPRIM) 100 MG tablet [id number] Yes TAKE 1 TABLET ONCE DAILY. Janith Lima, MD Taking Active   Ascorbic Acid (VITAMIN C) 1000 MG tablet [id number] Yes Take 1,000 mg by mouth daily. [provider] Taking Active Self  b complex vitamins tablet [id number] Yes Take 1 tablet by mouth daily. [provider] Taking Active   budesonide-formoterol (SYMBICORT) 160-4.5 MCG/ACT inhaler [id number] Yes Inhale 2 puffs into the lungs 2 (two) times daily. Janith Lima, MD Taking Active   buPROPion (WELLBUTRIN XL) 150 MG 24 hr tablet [id number] Yes TAKE 3 TABLETS DAILY. Janith Lima, MD Taking Active   co-enzyme Q-10 30 MG capsule [id number] Yes Take 30 mg by mouth 3 (three) times daily. [provider] Taking Active Self  Colchicine (MITIGARE) 0.6 MG CAPS [id number] Yes Take 1 tablet by mouth 2 (two) times daily. Janith Lima, MD Taking Active   famotidine (PEPCID) 10 MG tablet [id number] Yes Take 10 mg by mouth 2 (two) times daily as needed for heartburn or indigestion. [provider] Taking Active Self  hyoscyamine (LEVSIN SL) 0.125 MG SL tablet [id number] Yes Place 1 tablet (0.125 mg total) under the tongue every 4 (four) hours as needed. Janith Lima, MD Taking Active   ipratropium-albuterol (DUONEB) 0.5-2.5 (3) MG/3ML SOLN [id number] Yes Take 3 mLs by nebulization as needed. Janith Lima, MD Taking Active   irbesartan (AVAPRO) 150 MG tablet [id number] Yes Take 1 tablet (150 mg total) by mouth daily. Janith Lima, MD Taking Active         Discontinued 03/16/13 1210 (Reorder)   levocetirizine (XYZAL) 5 MG tablet [id number]  Take 2 tablets (10 mg total) by mouth every evening for 7 days. Janith Lima, MD  Expired 06/29/19 2359    liothyronine (CYTOMEL) 25 MCG tablet [id number] Yes TAKE 1 TABLET ONCE DAILY. Janith Lima, MD Taking Active   liothyronine (CYTOMEL) 5 MCG tablet [id number] Yes TAKE 1 TABLET ONCE DAILY. Janith Lima, MD Taking Active   Multiple Vitamin (MULTIVITAMIN) tablet [id number] Yes Take 1 tablet by mouth daily. [provider] Taking Active   omega-3 acid ethyl esters (LOVAZA) 1 g capsule [id number] Yes TAKE (2) CAPSULES BY MOUTH TWICE DAILY. Janith Lima, MD Taking Active   oxyCODONE (OXY IR/ROXICODONE) 5 MG immediate release tablet [id number] Yes Take 1 tablet (5 mg total) by mouth every 6 (six) hours as needed for severe pain. Janith Lima, MD Taking Active   potassium chloride (KLOR-CON) 10 MEQ tablet [id number] Yes TAKE 1 TABLET BY MOUTH DAILY. Janith Lima, MD Taking Active   pravastatin (PRAVACHOL) 20 MG tablet [id number]  Take 20 mg by mouth daily. [provider]  Active   SYNTHROID 25 MCG tablet [id number] Yes TAKE 1 TABLET ONCE DAILY. Janith Lima, MD Taking Active   tiZANidine (ZANAFLEX) 2 MG tablet [id number] Yes Take 1 tablet (2 mg total) by mouth at bedtime. Izora Ribas, MD Taking Active   torsemide (DEMADEX) 20 MG tablet [id number] Yes TAKE 1 TABLET ONCE DAILY. Janith Lima, MD Taking Active   VIIBRYD 40 MG TABS [id number] Yes TAKE 1 TABLET ONCE DAILY. Janith Lima, MD Taking Active           Patient Active Problem List   Diagnosis Date Noted  . Encounter for general adult medical examination with abnormal findings 11/09/2020  . DDD (degenerative disc disease), lumbar 10/14/2020  . Hyperlipidemia with target LDL less than 100 10/15/2019  . Routine general medical examination at a health care facility 10/14/2019  . Chronic gout due to renal impairment of multiple sites without tophus 04/13/2019  . LVH (left ventricular hypertrophy) due to hypertensive disease, with heart failure ([place])  11/20/2018  . Diastolic dysfunction with chronic heart failure  ([place]) 11/20/2018  . Esophageal dysphagia 10/30/2018  . Primary osteoarthritis of both knees 08/28/2016  . Radiculitis of right cervical region 07/30/2016  . Ocular rosacea 12/21/2015  . Pure hyperglyceridemia 11/01/2015  . Nonspecific abnormal electrocardiogram (ECG) (EKG) 10/31/2015  . Nuclear sclerosis of both eyes 01/05/2015  . Heart murmur, systolic 46/27/0350  . Depression with anxiety 07/22/2014  . Asthma, intermittent 06/03/2014  . Visit for screening mammogram 06/03/2014  . Spinal stenosis, lumbar region, with neurogenic claudication 06/03/2014  . Kidney disease, chronic, stage IV (GFR 15-29 ml/min) ([place]) 06/03/2014  . Parathyroid hormone excess ([place]) 08/31/2013  . Celiac disease 08/18/2007  . Essential hypertension 05/06/2007  . Allergic rhinitis 05/06/2007  . OSTEOPOROSIS 05/06/2007  . Other specified hypothyroidism 04/28/2007  . Morbid obesity ([place]) 04/28/2007  . GERD 04/28/2007    Immunization History  Administered Date(s) Administered  . Fluad Quad(high Dose 65+) 06/30/2019  . Influenza Split 07/23/2011, 06/24/2012  . Influenza Whole 07/18/2007, 06/14/2008, 06/13/2010  . Influenza, High Dose Seasonal PF 07/24/2013, 06/12/2016, 07/02/2017, 07/03/2018  . Influenza,inj,Quad PF,6+ Mos 06/03/2014, 06/30/2015  . Influenza-Unspecified 07/20/2020  . PFIZER(Purple Top)SARS-COV-2 Vaccination 10/07/2019, 10/28/2019, 07/02/2020  . Pneumococcal Conjugate-13 07/24/2013  . Pneumococcal Polysaccharide-23 06/30/2015, 11/09/2020  . Td 12/17/1998  . Tdap 06/24/2012  . Zoster 05/01/2011  . Zoster Recombinat (Shingrix) 03/18/2018, 08/07/2018    Conditions to be addressed/monitored:  Hypertension, Hyperlipidemia, Heart Failure, Asthma, Chronic Kidney Disease, Hypothyroidism, Depression, Anxiety, Osteopenia and Gout  Care Plan : [place]  Updates made by Charlton Haws, RPH since 01/11/2021 12:00 AM    Problem: Hypertension, Hyperlipidemia, Heart Failure,  Asthma, Chronic Kidney Disease, Hypothyroidism, Depression, Anxiety, Osteopenia and Gout   Priority: High    Long-Range Goal: Disease management   Start Date: 01/10/2021  Expected End Date: 07/12/2021  This Visit's Progress: On track  Priority: High  Note:   Current Barriers:  . Unable to independently monitor therapeutic efficacy . Suboptimal therapeutic regimen for hyperlipidemia  Pharmacist Clinical Goal(s):  Marland Kitchen Patient will achieve adherence to monitoring guidelines and medication adherence to achieve therapeutic efficacy . adhere to plan to optimize therapeutic regimen for hyperlipidemia as evidenced by report of adherence to recommended medication management changes through collaboration with PharmD and provider.   Interventions: . 1:1 collaboration with Janith Lima, MD regarding development and update of comprehensive plan of care as evidenced by provider attestation and co-signature . Inter-disciplinary care team collaboration (see longitudinal plan of care) . Comprehensive medication review performed; medication list updated in electronic medical record  Hyperlipidemia: (LDL goal < 100) -Not ideally controlled - pt reports aching/cramps since starting rosuvastatin, CoQ10 has not helped. LDL improved 107 > 73 and Trig 280 > 166 since being on rosuvastatin. She heard about Praluent injections and is interested in trying it instead of rosuvastatin. -Current treatment: . Rosuvastatin 10 mg daily (started 09/2019) . Lovaza 1 g - 2 cap BID . Coenzyme Q10 30 mg TID -Medications previously tried: none  -Assessed HLD options - the preferred PCSK9 inhibitor with her insurance is Repatha. She has to fail 2 statins for it to be covered. She has only tried rosuvastatin 10 mg so far. -Plan: switch rosuvastatin to pravastatin 20 mg and re-check lipids next month at PCP visit.  -If pt still has myalgias or lipids are above goal, insurance will cover Repatha  Heart Failure / HTN / CKD 3b  (Goal: BP < 130/80 and prevent exacerbations) -Controlled -Last  ejection fraction: 60-65% (Date: 11/2018) -HF type: Diastolic -NYHA Class: not specified -Current treatment: . Irbesartan 150 mg daily . Torsemide 20 mg daily . Klor con 10 meq daily -Medications previously tried: furosemide, losartan, metolazone, telmisartan -Educated on Benefits of medications for managing symptoms and prolonging life -Recommended to continue current medication  Asthma (Goal: control symptoms and prevent exacerbations) -Controlled -Current treatment  . Symbicort 160-4.5 mcg/act 2 puffs BID . Duoneb PRN . Albuterol HFA prn -Pulmonary function testing: not on file -Exacerbations requiring treatment in last 6 months: none -Patient reports consistent use of maintenance inhaler -Frequency of rescue inhaler use: infrequent -Counseled on Proper inhaler technique; Benefits of consistent maintenance inhaler use -Recommended to continue current medication  Osteopenia (Goal prevent fractures) -Not ideally controlled - pt is not taking Calcium/vitamin D -Last DEXA Scan: 10/2015  T-Score femoral neck: -2.1  T-Score lumbar spine: -0.1  10-year probability of major osteoporotic fracture: 11.7%  10-year probability of hip fracture: 2.4% -Patient is not a candidate for pharmacologic treatment -Recommend [phone] units of vitamin D daily. Recommend 1200 mg of calcium daily from dietary and supplemental sources.  Hypothyroidism (Goal: maintain TSH in goal range) -Uncontrolled - most recent TSH is low and pt was instructed to stop Synthroid 25 mcg and liothyronine 5 mcg -Current treatment  . Synthroid 25 mcg daily . Liothyronine 25 mcg daily . Liothyronine 5 mcg daily -Advised to stop Synthroid and liothyronine 5 mcg as previously instructed by PCP. F/U 1 month with PCP.  Depression/Anxiety (Goal: manage symptoms) -Controlled -Current treatment: . Bupropion XL 150 mg - 3 tablets daily . Viibryd 40 mg  daily -PHQ9: 17 (11/2020) -GAD7: 16 (04/2017) -Connected with PCP for mental health support -Educated on Benefits of medication for symptom control -Recommended to continue current medication  Gout (Goal: prevent flares) -Controlled -Current treatment  . Allopurinol 100 mg daily . Colchicine 0.6 mg BID -Recommended to continue current medication  Patient Goals/Self-Care Activities . Patient will:  - take medications as prescribed focus on medication adherence by pill box check blood pressure weekly, document, and provide at future appointments  Follow Up Plan: Telephone follow up appointment with care management team member scheduled for: 2 months      Medication Assistance: None required.  Patient affirms current coverage meets needs.  Patient's preferred pharmacy is:  [facility], [place] [address] Phone: [phone] Fax: [phone]  [facility] - [place], [place] - [address] [place] [address] Phone: [phone] Fax: [phone]  [facility] - [place], [place] - [address] [place] [address] Phone: [phone] Fax: [phone]  Uses pill box? Yes Pt endorses 100% compliance  We discussed: Current pharmacy is preferred with insurance plan and patient is satisfied with pharmacy services Patient decided to: Continue current medication management strategy  Care Plan and Follow Up Patient Decision:  Patient agrees to Care Plan and Follow-up.  Plan: Telephone follow up appointment with care management team member scheduled for:  2 months  Charlene Brooke, PharmD, [place], CPP Clinical Pharmacist [facility] [phone]

## 2021-01-10 NOTE — Progress Notes (Signed)
Chronic Care Management Pharmacy Assistant   Name: Leslie Edwards  MRN: [id number] DOB: [birth year]   Reason for Encounter: Hypertension Disease State Call   Conditions to be addressed/monitored: HTN   Recent office visits:  None ID  Recent consult visits:  None ID  Hospital visits:  None in previous 6 months  Medications: Outpatient Encounter Medications as of 01/10/2021  Medication Sig  . albuterol (PROVENTIL HFA;VENTOLIN HFA) 108 (90 Base) MCG/ACT inhaler Inhale 2 puffs into the lungs every 6 (six) hours as needed.  Marland Kitchen allopurinol (ZYLOPRIM) 100 MG tablet TAKE 1 TABLET ONCE DAILY.  Marland Kitchen Ascorbic Acid (VITAMIN C) 1000 MG tablet Take 1,000 mg by mouth daily.  Marland Kitchen b complex vitamins tablet Take 1 tablet by mouth daily.  . budesonide-formoterol (SYMBICORT) 160-4.5 MCG/ACT inhaler Inhale 2 puffs into the lungs 2 (two) times daily.  Marland Kitchen buPROPion (WELLBUTRIN XL) 150 MG 24 hr tablet TAKE 3 TABLETS DAILY.  Marland Kitchen co-enzyme Q-10 30 MG capsule Take 30 mg by mouth 3 (three) times daily.  . Colchicine (MITIGARE) 0.6 MG CAPS Take 1 tablet by mouth 2 (two) times daily.  . famotidine (PEPCID) 10 MG tablet Take 10 mg by mouth 2 (two) times daily as needed for heartburn or indigestion.  . hyoscyamine (LEVSIN SL) 0.125 MG SL tablet Place 1 tablet (0.125 mg total) under the tongue every 4 (four) hours as needed.  Marland Kitchen ipratropium-albuterol (DUONEB) 0.5-2.5 (3) MG/3ML SOLN Take 3 mLs by nebulization as needed.  . irbesartan (AVAPRO) 150 MG tablet Take 1 tablet (150 mg total) by mouth daily.  Marland Kitchen levocetirizine (XYZAL) 5 MG tablet Take 2 tablets (10 mg total) by mouth every evening for 7 days.  Marland Kitchen liothyronine (CYTOMEL) 25 MCG tablet TAKE 1 TABLET ONCE DAILY.  Marland Kitchen liothyronine (CYTOMEL) 5 MCG tablet TAKE 1 TABLET ONCE DAILY.  . Multiple Vitamin (MULTIVITAMIN) tablet Take 1 tablet by mouth daily.  Marland Kitchen omega-3 acid ethyl esters (LOVAZA) 1 g capsule TAKE (2) CAPSULES BY MOUTH TWICE DAILY.  Marland Kitchen oxyCODONE (OXY  IR/ROXICODONE) 5 MG immediate release tablet Take 1 tablet (5 mg total) by mouth every 6 (six) hours as needed for severe pain.  . potassium chloride (KLOR-CON) 10 MEQ tablet TAKE 1 TABLET BY MOUTH DAILY.  . rosuvastatin (CRESTOR) 10 MG tablet TAKE 1 TABLET ONCE DAILY.  . SYNTHROID 25 MCG tablet TAKE 1 TABLET ONCE DAILY.  Marland Kitchen tiZANidine (ZANAFLEX) 2 MG tablet Take 1 tablet (2 mg total) by mouth at bedtime.  . torsemide (DEMADEX) 20 MG tablet TAKE 1 TABLET ONCE DAILY.  Marland Kitchen VIIBRYD 40 MG TABS TAKE 1 TABLET ONCE DAILY.  . [DISCONTINUED] K-DUR 10 MEQ tablet TAKE 1 TABLET DAILY.   No facility-administered encounter medications on file as of 01/10/2021.   Reviewed chart prior to disease state call. Spoke with patient regarding BP  Recent Office Vitals: BP Readings from Last 3 Encounters:  12/01/20 125/69  11/09/20 (!) 160/92  10/14/20 138/88   Pulse Readings from Last 3 Encounters:  12/01/20 78  11/09/20 69  10/14/20 66    Wt Readings from Last 3 Encounters:  12/01/20 225 lb (102.1 kg)  11/09/20 223 lb (101.2 kg)  10/14/20 222 lb (100.7 kg)     Kidney Function Lab Results  Component Value Date/Time   CREATININE 1.27 (H) 11/09/2020 10:18 AM   CREATININE 1.36 (H) 04/12/2020 10:26 AM   CREATININE 1.35 (H) 10/14/2019 11:55 AM   CREATININE 1.39 (H) 07/24/2013 04:45 PM   GFR 41.28 (L) 11/09/2020 10:18  AM   GFRNONAA 38 (L) 04/12/2020 10:26 AM   GFRAA 44 (L) 04/12/2020 10:26 AM    BMP Latest Ref Rng & Units 11/09/2020 11/09/2020 04/12/2020  Glucose 70 - 99 mg/dL - 100(H) 100(H)  BUN 6 - 23 mg/dL - 23 25  Creatinine 0.40 - 1.20 mg/dL - 1.27(H) 1.36(H)  BUN/Creat Ratio 6 - 22 (calc) - - 18  Sodium 135 - 145 mEq/L - 137 141  Potassium 3.5 - 5.1 mEq/L - 4.2 4.6  Chloride 96 - 112 mEq/L - 102 106  CO2 19 - 32 mEq/L - 28 26  Calcium 8.6 - 10.4 mg/dL 10.0 9.9 9.9    . Current antihypertensive regimen:  Patient is taking irbesartan 150 mg daily  . How often are you checking your Blood  Pressure? Patient states that she stopped checking blood pressure at home not sure the cuff works properly.  . Current home BP readings: Patient last reading was 126/69 on 12/01/20  . What recent interventions/DTPs have been made by any provider to improve Blood Pressure control since last CPP Visit: None ID  . Any recent hospitalizations or ED visits since last visit with CPP? None ID  . What diet changes have been made to improve Blood Pressure Control?  Patient states that there has been no changes in her diet  . What exercise is being done to improve your Blood Pressure Control?  Patient states no changes in exercise since pain in back  Adherence Review: Is the patient currently on ACE/ARB medication? Yes, Irbesartan Does the patient have >5 day gap between last estimated fill dates? No   Star Rating Drugs: Irbesartan 11/09/20 90 ds Rosuvastatin 11/18/20 90 ds  Ethelene Hal Clinical Pharmacist Assistant [phone]  Time spent:31

## 2021-01-11 NOTE — Patient Instructions (Signed)
Visit Information  Phone number for Pharmacist: [phone]  Goals Addressed   None    Patient Care Plan: [facility] Care Plan    Problem Identified: Hypertension, Hyperlipidemia, Heart Failure, Asthma, Chronic Kidney Disease, Hypothyroidism, Depression, Anxiety, Osteopenia and Gout   Priority: High    Long-Range Goal: Disease management   Start Date: 01/10/2021  Expected End Date: 07/12/2021  This Visit's Progress: On track  Priority: High  Note:   Current Barriers:  . Unable to independently monitor therapeutic efficacy . Suboptimal therapeutic regimen for hyperlipidemia  Pharmacist Clinical Goal(s):  Marland Kitchen Patient will achieve adherence to monitoring guidelines and medication adherence to achieve therapeutic efficacy . adhere to plan to optimize therapeutic regimen for hyperlipidemia as evidenced by report of adherence to recommended medication management changes through collaboration with PharmD and provider.   Interventions: . 1:1 collaboration with Janith [place], MD regarding development and update of comprehensive plan of care as evidenced by provider attestation and co-signature . Inter-disciplinary care team collaboration (see longitudinal plan of care) . Comprehensive medication review performed; medication list updated in electronic medical record  Hyperlipidemia: (LDL goal < 100) -Not ideally controlled - pt reports aching/cramps since starting rosuvastatin, CoQ10 has not helped. LDL improved 107 > 73 and Trig 280 > 166 since being on rosuvastatin. She heard about Praluent injections and is interested in trying it instead of rosuvastatin. -Current treatment: . Rosuvastatin 10 mg daily (started 09/2019) . Lovaza 1 g - 2 cap BID . Coenzyme Q10 30 mg TID -Medications previously tried: none  -Assessed HLD options - the preferred PCSK9 inhibitor with her insurance is Repatha. She has to fail 2 statins for it to be covered. She has only tried rosuvastatin 10 mg so  far. -Plan: switch rosuvastatin to pravastatin 20 mg and re-check lipids next month at PCP visit.  -If pt still has myalgias or lipids are above goal, insurance will cover Repatha  Heart Failure / HTN / CKD 3b (Goal: BP < 130/80 and prevent exacerbations) -Controlled -Last ejection fraction: 60-65% (Date: 11/2018) -HF type: Diastolic -NYHA Class: not specified -Current treatment: . Irbesartan 150 mg daily . Torsemide 20 mg daily . Klor con 10 meq daily -Medications previously tried: furosemide, losartan, metolazone, telmisartan -Educated on Benefits of medications for managing symptoms and prolonging life -Recommended to continue current medication  Asthma (Goal: control symptoms and prevent exacerbations) -Controlled -Current treatment  . Symbicort 160-4.5 mcg/act 2 puffs BID . Duoneb PRN . Albuterol HFA prn -Pulmonary function testing: not on file -Exacerbations requiring treatment in last 6 months: none -Patient reports consistent use of maintenance inhaler -Frequency of rescue inhaler use: infrequent -Counseled on Proper inhaler technique; Benefits of consistent maintenance inhaler use -Recommended to continue current medication  Osteopenia (Goal prevent fractures) -Not ideally controlled - pt is not taking Calcium/vitamin D -Last DEXA Scan: 10/2015  T-Score femoral neck: -2.1  T-Score lumbar spine: -0.1  10-year probability of major osteoporotic fracture: 11.7%  10-year probability of hip fracture: 2.4% -Patient is not a candidate for pharmacologic treatment -Recommend [phone] units of vitamin D daily. Recommend 1200 mg of calcium daily from dietary and supplemental sources.  Hypothyroidism (Goal: maintain TSH in goal range) -Uncontrolled - most recent TSH is low and pt was instructed to stop Synthroid 25 mcg and liothyronine 5 mcg -Current treatment  . Synthroid 25 mcg daily . Liothyronine 25 mcg daily . Liothyronine 5 mcg daily -Advised to stop Synthroid and  liothyronine 5 mcg as previously instructed by PCP. F/U 1  month with PCP.  Depression/Anxiety (Goal: manage symptoms) -Controlled -Current treatment: . Bupropion XL 150 mg - 3 tablets daily . Viibryd 40 mg daily -PHQ9: 17 (11/2020) -GAD7: 16 (04/2017) -Connected with PCP for mental health support -Educated on Benefits of medication for symptom control -Recommended to continue current medication  Gout (Goal: prevent flares) -Controlled -Current treatment  . Allopurinol 100 mg daily . Colchicine 0.6 mg BID -Recommended to continue current medication  Patient Goals/Self-Care Activities . Patient will:  - take medications as prescribed focus on medication adherence by pill box check blood pressure weekly, document, and provide at future appointments  Follow Up Plan: Telephone follow up appointment with care management team member scheduled for: 2 months      Patient verbalizes understanding of instructions provided today and agrees to view in [place].  Telephone follow up appointment with pharmacy team member scheduled for: 2 months  Charlene Brooke, PharmD, [place], CPP Clinical Pharmacist [facility] [phone]

## 2021-01-12 ENCOUNTER — Ambulatory Visit: Payer: Medicare Other

## 2021-01-16 ENCOUNTER — Other Ambulatory Visit: Payer: Self-pay

## 2021-01-16 ENCOUNTER — Ambulatory Visit: Payer: Medicare Other | Attending: Physical Medicine and Rehabilitation

## 2021-01-16 DIAGNOSIS — M545 Low back pain, unspecified: Secondary | ICD-10-CM | POA: Diagnosis not present

## 2021-01-16 DIAGNOSIS — R269 Unspecified abnormalities of gait and mobility: Secondary | ICD-10-CM | POA: Insufficient documentation

## 2021-01-16 DIAGNOSIS — M6281 Muscle weakness (generalized): Secondary | ICD-10-CM | POA: Diagnosis not present

## 2021-01-16 NOTE — Therapy (Signed)
[place], [place], [address] Phone: [phone]   Fax:  [phone]  Physical Therapy Treatment  Patient Details  Name: Leslie Edwards MRN: [id number] Date of Birth: November 01, 1945 Referring Provider (PT): Ranell Patrick Clide Deutscher, MD   Encounter Date: 01/16/2021   PT End of Session - 01/16/21 0959    Visit Number 4    Number of Visits 25    Date for PT Re-Evaluation 03/16/21    Authorization Type BCBS MCR    Progress Note Due on Visit 10    PT Start Time 1003    PT Stop Time 3810    PT Time Calculation (min) 41 min    Activity Tolerance Patient tolerated treatment well;No increased pain    Behavior During Therapy WFL for tasks assessed/performed           Past Medical History:  Diagnosis Date  . Allergy   . ANEMIA, B12 DEFICIENCY 01/28/2009   Qualifier: Diagnosis of  By: Arnoldo Morale MD, Balinda Quails   . Anxiety   . Asthma   . GERD (gastroesophageal reflux disease)   . Hypertension   . Hypothyroidism   . OSTEOPOROSIS 05/06/2007   Qualifier: Diagnosis of  By: Arnoldo Morale MD, Balinda Quails   . Parathyroid hormone excess ([place]) 08/31/2013   Secondary to low dietary calcium/calcium malabsorption   . PLANTAR FASCIITIS, LEFT 07/05/2009   Qualifier: Diagnosis of  By: Arnoldo Morale MD, [place] 02/25/2009   Qualifier: Diagnosis of  By: Arnoldo Morale MD, Abelino Derrick   . UNSPECIFIED IRON DEFICIENCY ANEMIA 01/28/2009   Qualifier: Diagnosis of  By: Arnoldo Morale MD, Balinda Quails VENOUS INSUFFICIENCY, CHRONIC 04/28/2007   Qualifier: Diagnosis of  By: Scherrie Gerlach      Past Surgical History:  Procedure Laterality Date  . BREAST BIOPSY Left   . BREAST EXCISIONAL BIOPSY Right   . CARPAL TUNNEL RELEASE    . KNEE ARTHROSCOPY     right  . LIPOMA EXCISION  1965   axilary  . nissan fundiplication    . TONSILLECTOMY      There were no vitals filed for this visit.   Subjective Assessment - 01/16/21 1006    Subjective Pt presents to  PT with reports of continued lower back pain and some stiffness. She has been compliant with her HEP and only notes pain with figure 4 stretch. Pt is ready to begin PT treatment at this time.    Currently in Pain? Yes    Pain Score 4     Pain Location Back    Pain Orientation Lower    Pain Descriptors / Indicators Aching                             [facility] Adult PT Treatment/Exercise - 01/16/21 0001      Lumbar Exercises: Stretches   Lower Trunk Rotation 5 reps;10 seconds      Lumbar Exercises: Aerobic   Nustep lvl 5 UE/LE x 5 min while taking subjective      Lumbar Exercises: Seated   Long Arc Quad on Chair 3 sets;10 reps;Both    LAQ on Chair Weights (lbs) 2      Lumbar Exercises: Supine   Pelvic Tilt 10 reps;5 seconds    Pelvic Tilt Limitations supine PPT w/ ball squeeze 2x10 - 5 sec hold    Bent Knee Raise 10 reps  Bent Knee Raise Limitations x2 with PPT    Bridge 10 reps    Bridge Limitations x2; partial range    Other Supine Lumbar Exercises supine clam single leg 2x10 ea blue tband    Other Supine Lumbar Exercises hamstring ball rollouts 2x10 red swiss ball                    PT Short Term Goals - 01/10/21 0945      PT SHORT TERM GOAL #1   Title Pt will be compliant and knowledgeable with 90% of HEP in order to improve carryover    Time 3    Period Weeks    Status Achieved    Target Date 01/12/21      PT SHORT TERM GOAL #2   Title Pt will be able to perform sit>stand transfer independently with no increase in LBP or UE support in order to improve functional mobility    Baseline Mod I  with use of BUE; CGA from raised height and no UE support    Time 3    Period Weeks    Status On-going    Target Date 01/12/21             PT Long Term Goals - 12/22/20 1318      PT LONG TERM GOAL #1   Title Pt will increase FOTO functional score to no less than 46% in order to improve confidence and functional ability    Baseline 23%  functional score at evaluation    Time 12    Period Weeks    Status New    Target Date 03/16/21      PT LONG TERM GOAL #2   Title Pt will increase standing tolerance to 30 minutes in order to improve ADL performance and functional ability    Baseline 5 minutes at present    Time 12    Period Weeks    Status New    Target Date 03/16/21      PT LONG TERM GOAL #3   Title Pt will be able to ambulate 1047ft with no increase in LBP in order to improve comfort and functional mobility with community activities    Baseline pain with general ambulation at 65ft    Time 12    Period Weeks    Status New    Target Date 03/16/21      PT LONG TERM GOAL #4   Title Pt will self report LBP no greater than 3/10 at worst in order to improve comfort and functional ability    Baseline 9/10 at worst    Time 12    Period Weeks    Status New    Target Date 03/16/21                 Plan - 01/16/21 1138    Clinical Impression Statement Pt was able to ocmplete prescribed exercises and showed improved activity tolerance today. She continues to show decreased proximal hip strength and mobility, along with difficulty performing sit to stand transfer. Pt is progressing with therapy and should continue to be seen and progress per POC as prescribed.    PT Treatment/Interventions ADLs/Self Care Home Management;Electrical Stimulation;Moist Heat;Ultrasound;Traction;Gait training;Stair training;Therapeutic activities;Therapeutic exercise;Neuromuscular re-education;Balance training;Patient/family education;Manual techniques;Passive range of motion;Dry needling;Spinal Manipulations;Joint Manipulations    PT Next Visit Plan review HEP. continue to work on sit to stand transfer and progression of strengthening exercise as able    PT Home Exercise Plan Access  Code [email or web address]           Patient will benefit from skilled therapeutic intervention in order to improve the following deficits and impairments:  Abnormal  gait,Decreased activity tolerance,Decreased balance,Decreased endurance,Decreased range of motion,Decreased strength,Difficulty walking,Pain  Visit Diagnosis: Muscle weakness (generalized)  Impaired gait  Low back pain, non-specific     Problem List Patient Active Problem List   Diagnosis Date Noted  . Encounter for general adult medical examination with abnormal findings 11/09/2020  . DDD (degenerative disc disease), lumbar 10/14/2020  . Hyperlipidemia with target LDL less than 100 10/15/2019  . Routine general medical examination at a health care facility 10/14/2019  . Chronic gout due to renal impairment of multiple sites without tophus 04/13/2019  . LVH (left ventricular hypertrophy) due to hypertensive disease, with heart failure ([place]) 11/20/2018  . Diastolic dysfunction with chronic heart failure ([place]) 11/20/2018  . Esophageal dysphagia 10/30/2018  . Primary osteoarthritis of both knees 08/28/2016  . Radiculitis of right cervical region 07/30/2016  . Ocular rosacea 12/21/2015  . Pure hyperglyceridemia 11/01/2015  . Nonspecific abnormal electrocardiogram (ECG) (EKG) 10/31/2015  . Nuclear sclerosis of both eyes 01/05/2015  . Heart murmur, systolic 66/02/3015  . Depression with anxiety 07/22/2014  . Asthma, intermittent 06/03/2014  . Visit for screening mammogram 06/03/2014  . Spinal stenosis, lumbar region, with neurogenic claudication 06/03/2014  . Kidney disease, chronic, stage IV (GFR 15-29 ml/min) ([place]) 06/03/2014  . Parathyroid hormone excess ([place]) 08/31/2013  . Celiac disease 08/18/2007  . Essential hypertension 05/06/2007  . Allergic rhinitis 05/06/2007  . OSTEOPOROSIS 05/06/2007  . Other specified hypothyroidism 04/28/2007  . Morbid obesity ([place]) 04/28/2007  . GERD 04/28/2007    Ward Chatters, PT, DPT 01/16/21 11:41 AM  [facility] [address] [place], [place], [address] Phone: [phone]   Fax:   [phone]  Name: Leslie Edwards MRN: [id number] Date of Birth: 12/11/1945

## 2021-01-19 ENCOUNTER — Other Ambulatory Visit: Payer: Self-pay

## 2021-01-19 ENCOUNTER — Ambulatory Visit: Payer: Medicare Other

## 2021-01-19 DIAGNOSIS — R269 Unspecified abnormalities of gait and mobility: Secondary | ICD-10-CM | POA: Diagnosis not present

## 2021-01-19 DIAGNOSIS — M545 Low back pain, unspecified: Secondary | ICD-10-CM

## 2021-01-19 DIAGNOSIS — M6281 Muscle weakness (generalized): Secondary | ICD-10-CM

## 2021-01-19 NOTE — Therapy (Signed)
[place], [place], [address] Phone: [phone]   Fax:  [phone]  Physical Therapy Treatment  Patient Details  Name: Leslie Edwards MRN: [id number] Date of Birth: 02-21-1946 Referring Provider (PT): Ranell Patrick, Clide Deutscher, MD   Encounter Date: 01/19/2021   PT End of Session - 01/19/21 0957    Visit Number 5    Number of Visits 25    Date for PT Re-Evaluation 03/16/21    Authorization Type BCBS MCR    Progress Note Due on Visit 10    PT Start Time 1000    PT Stop Time 1041    PT Time Calculation (min) 41 min    Activity Tolerance Patient tolerated treatment well;No increased pain    Behavior During Therapy WFL for tasks assessed/performed           Past Medical History:  Diagnosis Date  . Allergy   . ANEMIA, B12 DEFICIENCY 01/28/2009   Qualifier: Diagnosis of  By: Arnoldo Morale MD, Balinda Quails   . Anxiety   . Asthma   . GERD (gastroesophageal reflux disease)   . Hypertension   . Hypothyroidism   . OSTEOPOROSIS 05/06/2007   Qualifier: Diagnosis of  By: Arnoldo Morale MD, Balinda Quails   . Parathyroid hormone excess ([place]) 08/31/2013   Secondary to low dietary calcium/calcium malabsorption   . PLANTAR FASCIITIS, LEFT 07/05/2009   Qualifier: Diagnosis of  By: Arnoldo Morale MD, [place] 02/25/2009   Qualifier: Diagnosis of  By: Arnoldo Morale MD, Abelino Derrick   . UNSPECIFIED IRON DEFICIENCY ANEMIA 01/28/2009   Qualifier: Diagnosis of  By: Arnoldo Morale MD, Balinda Quails VENOUS INSUFFICIENCY, CHRONIC 04/28/2007   Qualifier: Diagnosis of  By: Scherrie Gerlach      Past Surgical History:  Procedure Laterality Date  . BREAST BIOPSY Left   . BREAST EXCISIONAL BIOPSY Right   . CARPAL TUNNEL RELEASE    . KNEE ARTHROSCOPY     right  . LIPOMA EXCISION  1965   axilary  . nissan fundiplication    . TONSILLECTOMY      There were no vitals filed for this visit.   Subjective Assessment - 01/19/21 0958    Subjective Pt presents to  PT with reports of increased LBP with symptoms into bilateral posterior LEs. Pt notes that she had to ambulate up three high steps yesterday that did not have any handrails. Pt has been compliant with her HEP with no adverse effects noted. She is ready to begin PT treatment at this time.    Currently in Pain? Yes    Pain Score 5     Pain Location Back    Pain Orientation Lower                             [facility] Adult PT Treatment/Exercise - 01/19/21 0001      Lumbar Exercises: Aerobic   Nustep lvl 5 UE/LE x 5 min while taking subjective      Lumbar Exercises: Seated   Long Arc Quad on Chair 2 sets;15 reps;Both    LAQ on Chair Weights (lbs) 3    Other Seated Lumbar Exercises seated sciatic nerve glide x15 ea      Lumbar Exercises: Supine   Pelvic Tilt 10 reps;5 seconds    Pelvic Tilt Limitations supine PPT w/ ball squeeze 2x10 - 5 sec hold  Bent Knee Raise 10 reps    Bent Knee Raise Limitations x2 with PPT    Other Supine Lumbar Exercises supine PPT w/ clamshell 2x10 blue tband    Other Supine Lumbar Exercises hamstring ball rollouts 2x10 red swiss ball                  PT Education - 01/19/21 1037    Education Details updated HEP    Person(s) Educated Patient    Methods Explanation;Demonstration;Handout    Comprehension Verbalized understanding;Returned demonstration            PT Short Term Goals - 01/10/21 0945      PT SHORT TERM GOAL #1   Title Pt will be compliant and knowledgeable with 90% of HEP in order to improve carryover    Time 3    Period Weeks    Status Achieved    Target Date 01/12/21      PT SHORT TERM GOAL #2   Title Pt will be able to perform sit>stand transfer independently with no increase in LBP or UE support in order to improve functional mobility    Baseline Mod I  with use of BUE; CGA from raised height and no UE support    Time 3    Period Weeks    Status On-going    Target Date 01/12/21             PT Long  Term Goals - 12/22/20 1318      PT LONG TERM GOAL #1   Title Pt will increase FOTO functional score to no less than 46% in order to improve confidence and functional ability    Baseline 23% functional score at evaluation    Time 12    Period Weeks    Status New    Target Date 03/16/21      PT LONG TERM GOAL #2   Title Pt will increase standing tolerance to 30 minutes in order to improve ADL performance and functional ability    Baseline 5 minutes at present    Time 12    Period Weeks    Status New    Target Date 03/16/21      PT LONG TERM GOAL #3   Title Pt will be able to ambulate 1061ft with no increase in LBP in order to improve comfort and functional mobility with community activities    Baseline pain with general ambulation at 79ft    Time 12    Period Weeks    Status New    Target Date 03/16/21      PT LONG TERM GOAL #4   Title Pt will self report LBP no greater than 3/10 at worst in order to improve comfort and functional ability    Baseline 9/10 at worst    Time 12    Period Weeks    Status New    Target Date 03/16/21                 Plan - 01/19/21 1037    Clinical Impression Statement Pt was able to complete all prescribed exercises with no adverse effect. She responded well to sciatic nerve glides, which were added to HEP. Pt is tolerating activity better in recent visits and is progressing well with PT. Will continue to progress exercises as able per POC as prescribed.    PT Treatment/Interventions ADLs/Self Care Home Management;Electrical Stimulation;Moist Heat;Ultrasound;Traction;Gait training;Stair training;Therapeutic activities;Therapeutic exercise;Neuromuscular re-education;Balance training;Patient/family education;Manual techniques;Passive range of motion;Dry needling;Spinal Manipulations;Joint  Manipulations    PT Next Visit Plan continue to progress exercises as able    PT Home Exercise Plan Access Code [email or web address]           Patient will benefit  from skilled therapeutic intervention in order to improve the following deficits and impairments:  Abnormal gait,Decreased activity tolerance,Decreased balance,Decreased endurance,Decreased range of motion,Decreased strength,Difficulty walking,Pain  Visit Diagnosis: Muscle weakness (generalized)  Impaired gait  Low back pain, non-specific     Problem List Patient Active Problem List   Diagnosis Date Noted  . Encounter for general adult medical examination with abnormal findings 11/09/2020  . DDD (degenerative disc disease), lumbar 10/14/2020  . Hyperlipidemia with target LDL less than 100 10/15/2019  . Routine general medical examination at a health care facility 10/14/2019  . Chronic gout due to renal impairment of multiple sites without tophus 04/13/2019  . LVH (left ventricular hypertrophy) due to hypertensive disease, with heart failure ([place]) 11/20/2018  . Diastolic dysfunction with chronic heart failure ([place]) 11/20/2018  . Esophageal dysphagia 10/30/2018  . Primary osteoarthritis of both knees 08/28/2016  . Radiculitis of right cervical region 07/30/2016  . Ocular rosacea 12/21/2015  . Pure hyperglyceridemia 11/01/2015  . Nonspecific abnormal electrocardiogram (ECG) (EKG) 10/31/2015  . Nuclear sclerosis of both eyes 01/05/2015  . Heart murmur, systolic 37/16/9678  . Depression with anxiety 07/22/2014  . Asthma, intermittent 06/03/2014  . Visit for screening mammogram 06/03/2014  . Spinal stenosis, lumbar region, with neurogenic claudication 06/03/2014  . Kidney disease, chronic, stage IV (GFR 15-29 ml/min) ([place]) 06/03/2014  . Parathyroid hormone excess ([place]) 08/31/2013  . Celiac disease 08/18/2007  . Essential hypertension 05/06/2007  . Allergic rhinitis 05/06/2007  . OSTEOPOROSIS 05/06/2007  . Other specified hypothyroidism 04/28/2007  . Morbid obesity ([place]) 04/28/2007  . GERD 04/28/2007    Ward Chatters, PT, DPT 01/19/21 10:44 AM  [facility] [address] [place], [place], [address] Phone: [phone]   Fax:  [phone]  Name: Leslie Edwards MRN: [id number] Date of Birth: 1946-07-27

## 2021-01-23 ENCOUNTER — Other Ambulatory Visit: Payer: Self-pay

## 2021-01-23 ENCOUNTER — Ambulatory Visit: Payer: Medicare Other

## 2021-01-23 DIAGNOSIS — M6281 Muscle weakness (generalized): Secondary | ICD-10-CM | POA: Diagnosis not present

## 2021-01-23 DIAGNOSIS — M545 Low back pain, unspecified: Secondary | ICD-10-CM | POA: Diagnosis not present

## 2021-01-23 DIAGNOSIS — R269 Unspecified abnormalities of gait and mobility: Secondary | ICD-10-CM | POA: Diagnosis not present

## 2021-01-23 NOTE — Therapy (Signed)
[place], [place], [address] Phone: [phone]   Fax:  [phone]  Physical Therapy Treatment  Patient Details  Name: Leslie Edwards MRN: [id number] Date of Birth: Jan 15, 1946 Referring Provider (PT): Ranell Patrick Clide Deutscher, MD   Encounter Date: 01/23/2021   PT End of Session - 01/23/21 0959    Visit Number 6    Number of Visits 25    Date for PT Re-Evaluation 03/16/21    Authorization Type [employer] MCR    Progress Note Due on Visit 10    PT Start Time 1000    PT Stop Time 1045    PT Time Calculation (min) 45 min    Activity Tolerance Patient tolerated treatment well;No increased pain    Behavior During Therapy WFL for tasks assessed/performed           Past Medical History:  Diagnosis Date  . Allergy   . ANEMIA, B12 DEFICIENCY 01/28/2009   Qualifier: Diagnosis of  By: Arnoldo Morale MD, Balinda Quails   . Anxiety   . Asthma   . GERD (gastroesophageal reflux disease)   . Hypertension   . Hypothyroidism   . OSTEOPOROSIS 05/06/2007   Qualifier: Diagnosis of  By: Arnoldo Morale MD, Balinda Quails   . Parathyroid hormone excess ([place]) 08/31/2013   Secondary to low dietary calcium/calcium malabsorption   . PLANTAR FASCIITIS, LEFT 07/05/2009   Qualifier: Diagnosis of  By: Arnoldo Morale MD, [place] 02/25/2009   Qualifier: Diagnosis of  By: Arnoldo Morale MD, Abelino Derrick   . UNSPECIFIED IRON DEFICIENCY ANEMIA 01/28/2009   Qualifier: Diagnosis of  By: Arnoldo Morale MD, Balinda Quails VENOUS INSUFFICIENCY, CHRONIC 04/28/2007   Qualifier: Diagnosis of  By: Scherrie Gerlach      Past Surgical History:  Procedure Laterality Date  . BREAST BIOPSY Left   . BREAST EXCISIONAL BIOPSY Right   . CARPAL TUNNEL RELEASE    . KNEE ARTHROSCOPY     right  . LIPOMA EXCISION  1965   axilary  . nissan fundiplication    . TONSILLECTOMY      There were no vitals filed for this visit.   Subjective Assessment - 01/23/21 1000    Subjective Pt presents to  PT with reports of increased lower back pain after going to the grocery store a few days ago. She states she was unable to complete full HEP over the weekend due to pain. She believes pain increased after twisting and bending while getting groceries to the home. Pt is ready to begin PT treatment at this time.    Currently in Pain? Yes    Pain Score 7     Pain Location Back    Pain Orientation Lower                             [facility] Adult PT Treatment/Exercise - 01/23/21 0001      Lumbar Exercises: Stretches   Lower Trunk Rotation 5 reps;10 seconds      Lumbar Exercises: Aerobic   Nustep lvl 5 UE/LE x 5 min while taking subjective      Lumbar Exercises: Seated   Long Arc Quad on Chair 1 set;15 reps;Both    LAQ on Chair Weights (lbs) 3    Other Seated Lumbar Exercises seated sciatic nerve glide x15 ea    Other Seated Lumbar Exercises seated ball rollouts 2x10  Lumbar Exercises: Supine   Pelvic Tilt 10 reps;5 seconds    Pelvic Tilt Limitations supine PPT w/ ball squeeze 2x10 - 5 sec hold    Bent Knee Raise 10 reps    Bent Knee Raise Limitations x2 with PPT    Other Supine Lumbar Exercises supine PPT w/ clamshell 2x10 blue tband    Other Supine Lumbar Exercises hamstring ball rollouts 2x10 red swiss ball                    PT Short Term Goals - 01/10/21 0945      PT SHORT TERM GOAL #1   Title Pt will be compliant and knowledgeable with 90% of HEP in order to improve carryover    Time 3    Period Weeks    Status Achieved    Target Date 01/12/21      PT SHORT TERM GOAL #2   Title Pt will be able to perform sit>stand transfer independently with no increase in LBP or UE support in order to improve functional mobility    Baseline Mod I  with use of BUE; CGA from raised height and no UE support    Time 3    Period Weeks    Status On-going    Target Date 01/12/21             PT Long Term Goals - 12/22/20 1318      PT LONG TERM GOAL #1    Title Pt will increase FOTO functional score to no less than 46% in order to improve confidence and functional ability    Baseline 23% functional score at evaluation    Time 12    Period Weeks    Status New    Target Date 03/16/21      PT LONG TERM GOAL #2   Title Pt will increase standing tolerance to 30 minutes in order to improve ADL performance and functional ability    Baseline 5 minutes at present    Time 12    Period Weeks    Status New    Target Date 03/16/21      PT LONG TERM GOAL #3   Title Pt will be able to ambulate 1063ft with no increase in LBP in order to improve comfort and functional mobility with community activities    Baseline pain with general ambulation at 36ft    Time 12    Period Weeks    Status New    Target Date 03/16/21      PT LONG TERM GOAL #4   Title Pt will self report LBP no greater than 3/10 at worst in order to improve comfort and functional ability    Baseline 9/10 at worst    Time 12    Period Weeks    Status New    Target Date 03/16/21                 Plan - 01/23/21 1035    Clinical Impression Statement Pt tolerated treatment fair today, with no increase in pain noted. She was unable to progress exercises today secondary to increased lower back pain. She sees her referring provider before next session. She has progressed her functional mobility well with therapy, showing improved sit<>supine transfers as well as slight improvment in STS. Pt will continue to benefit from skilled PT services and will continue to be seen per POC as prescribed pending MD visit.    PT Treatment/Interventions ADLs/Self Care Home Management;Electrical  Stimulation;Moist Heat;Ultrasound;Traction;Gait Scientist, forensic;Therapeutic activities;Therapeutic exercise;Neuromuscular re-education;Balance training;Patient/family education;Manual techniques;Passive range of motion;Dry needling;Spinal Manipulations;Joint Manipulations    PT Next Visit Plan continue to  progress exercises as able    PT Home Exercise Plan Access Code [email or web address]           Patient will benefit from skilled therapeutic intervention in order to improve the following deficits and impairments:  Abnormal gait,Decreased activity tolerance,Decreased balance,Decreased endurance,Decreased range of motion,Decreased strength,Difficulty walking,Pain  Visit Diagnosis: Muscle weakness (generalized)  Impaired gait  Low back pain, non-specific     Problem List Patient Active Problem List   Diagnosis Date Noted  . Encounter for general adult medical examination with abnormal findings 11/09/2020  . DDD (degenerative disc disease), lumbar 10/14/2020  . Hyperlipidemia with target LDL less than 100 10/15/2019  . Routine general medical examination at a health care facility 10/14/2019  . Chronic gout due to renal impairment of multiple sites without tophus 04/13/2019  . LVH (left ventricular hypertrophy) due to hypertensive disease, with heart failure ([place]) 11/20/2018  . Diastolic dysfunction with chronic heart failure ([place]) 11/20/2018  . Esophageal dysphagia 10/30/2018  . Primary osteoarthritis of both knees 08/28/2016  . Radiculitis of right cervical region 07/30/2016  . Ocular rosacea 12/21/2015  . Pure hyperglyceridemia 11/01/2015  . Nonspecific abnormal electrocardiogram (ECG) (EKG) 10/31/2015  . Nuclear sclerosis of both eyes 01/05/2015  . Heart murmur, systolic 39/76/7341  . Depression with anxiety 07/22/2014  . Asthma, intermittent 06/03/2014  . Visit for screening mammogram 06/03/2014  . Spinal stenosis, lumbar region, with neurogenic claudication 06/03/2014  . Kidney disease, chronic, stage IV (GFR 15-29 ml/min) ([place]) 06/03/2014  . Parathyroid hormone excess ([place]) 08/31/2013  . Celiac disease 08/18/2007  . Essential hypertension 05/06/2007  . Allergic rhinitis 05/06/2007  . OSTEOPOROSIS 05/06/2007  . Other specified hypothyroidism 04/28/2007  . Morbid obesity ([place])  04/28/2007  . GERD 04/28/2007    Ward Chatters, PT, DPT 01/23/21 10:53 AM  [facility] [address] [place], [place], [address] Phone: [phone]   Fax:  [phone]  Name: FERRIN LIEBIG MRN: [id number] Date of Birth: [birth year]

## 2021-01-26 ENCOUNTER — Ambulatory Visit: Payer: Medicare Other

## 2021-01-26 ENCOUNTER — Encounter: Payer: Self-pay | Admitting: Physical Medicine and Rehabilitation

## 2021-01-26 ENCOUNTER — Encounter
Payer: Medicare Other | Attending: Physical Medicine and Rehabilitation | Admitting: Physical Medicine and Rehabilitation

## 2021-01-26 ENCOUNTER — Other Ambulatory Visit: Payer: Self-pay

## 2021-01-26 VITALS — BP 116/75 | HR 88 | Temp 98.4°F | Ht 63.0 in | Wt 224.0 lb

## 2021-01-26 DIAGNOSIS — M48061 Spinal stenosis, lumbar region without neurogenic claudication: Secondary | ICD-10-CM | POA: Insufficient documentation

## 2021-01-26 DIAGNOSIS — F418 Other specified anxiety disorders: Secondary | ICD-10-CM | POA: Insufficient documentation

## 2021-01-26 DIAGNOSIS — M545 Low back pain, unspecified: Secondary | ICD-10-CM | POA: Diagnosis not present

## 2021-01-26 DIAGNOSIS — R269 Unspecified abnormalities of gait and mobility: Secondary | ICD-10-CM

## 2021-01-26 DIAGNOSIS — M6281 Muscle weakness (generalized): Secondary | ICD-10-CM

## 2021-01-26 MED ORDER — LIDOCAINE 5 % EX PTCH: 1.0000 | MEDICATED_PATCH | CUTANEOUS | 3 refills | Status: DC

## 2021-01-26 MED ORDER — TIZANIDINE HCL 4 MG PO TABS: 4.0000 mg | ORAL_TABLET | Freq: Every day | ORAL | 3 refills | Status: DC

## 2021-01-26 NOTE — Patient Instructions (Addendum)
1) Ginger (especially studied for arthritis)- reduce leukotriene production to decrease inflammation 2) Blueberries- high in phytonutrients that decrease inflammation 3) Salmon- marine omega-3s reduce joint swelling and pain 4) Pumpkin seeds- reduce inflammation 5) dark chocolate- reduces inflammation 6) turmeric- reduces inflammation 7) tart cherries - reduce pain and stiffness 8) extra virgin olive oil - its compound olecanthal helps to block prostaglandins  9) chili peppers- can be eaten or applied topically via capsaicin 10) mint- helpful for headache, muscle aches, joint pain, and itching 11) garlic- reduces inflammation  Link to further information on diet for chronic pain: http://www.[email or web address]  Blue emu oil

## 2021-01-26 NOTE — Progress Notes (Signed)
Subjective:    Patient ID: Leslie Edwards, female    DOB: [birth year], 75 y.o.   MRN: [id number]  HPI  Mrs. Barnard is a 75 year old woman who presents for f/u for lower back pain that radiates into bilateral lower extremities.  1) Lumbar spinal stenosis with neurogenic claudication -pain is worse with walking and standing -worst in the morning when she wakes and then improves throughout the day -pain is stable.  -pain is constant, sharp, stabbing, tingling and aching -Flexeril makes her sleepy- tries  -She has been doing PT, marching, weights for leg raises, twists- she has 45 minutes.  -started in January without inciting event -back pain is worse than legs.   2) Obesity: -224 lbs.  -discussed current diet and role of inflammation in pain  3) Depressed from her pain: -she loves to garden -she is overwhelmed by caring for the home since her husband   Pain Inventory Average Pain 8 Pain Right Now 7 My pain is constant, sharp, stabbing, tingling and aching  In the last 24 hours, has pain interfered with the following? General activity 9 Relation with others 9 Enjoyment of life 10 What TIME of day is your pain at its worst? morning  Sleep (in general) Fair  Pain is worse with: walking, bending, standing and some activites Pain improves with: rest, medication and heat Relief from Meds: 8  how many minutes can you walk? 20 mins ability to climb steps?  yes do you drive?  yes Do you have any goals in this area?  yes  retired I need assistance with the following:  meal prep, household duties and shopping Do you have any goals in this area?  yes  weakness numbness tremor tingling trouble walking depression  Any changes since last visit?  yes x-rays 10/2020 by Dr. Ronnald Ramp (lower back)  Any changes since last visit?  no New Patient    Family History  Problem Relation Age of Onset  . Cancer Mother        lung  . Macular degeneration Father   . Arthritis Father    . Lymphoma Son        died age 23  . Dementia Maternal Grandmother   . Stroke Maternal Grandmother   . Healthy Brother   . Cancer Maternal Grandfather        esophageal cancer  . Cancer Paternal Grandmother        leukemia  . Healthy Brother   . Cancer Maternal Uncle        colon  . Cancer Maternal Aunt        esophageal cancer   Social History   Socioeconomic History  . Marital status: Married    Spouse name: Not on file  . Number of children: 1  . Years of education: Not on file  . Highest education level: Not on file  Occupational History  . Not on file  Tobacco Use  . Smoking status: Former Smoker    Quit date: 01/22/1975    Years since quitting: 46.0  . Smokeless tobacco: Never Used  Vaping Use  . Vaping Use: Never used  Substance and Sexual Activity  . Alcohol use: Yes    Alcohol/week: 3.0 standard drinks    Types: 3 Glasses of wine per week  . Drug use: No  . Sexual activity: Not Currently  Other Topics Concern  . Not on file  Social History Narrative   Regular exercise: Not walking   Works  3 days per week at [employer]   Caffeine use: yes   Social Determinants of Health   Financial Resource Strain: Not on file  Food Insecurity: Not on file  Transportation Needs: Not on file  Physical Activity: Not on file  Stress: Not on file  Social Connections: Not on file   Past Surgical History:  Procedure Laterality Date  . BREAST BIOPSY Left   . BREAST EXCISIONAL BIOPSY Right   . CARPAL TUNNEL RELEASE    . KNEE ARTHROSCOPY     right  . LIPOMA EXCISION  1965   axilary  . nissan fundiplication    . TONSILLECTOMY     Past Medical History:  Diagnosis Date  . Allergy   . ANEMIA, B12 DEFICIENCY 01/28/2009   Qualifier: Diagnosis of  By: Arnoldo Morale MD, Balinda Quails   . Anxiety   . Asthma   . GERD (gastroesophageal reflux disease)   . Hypertension   . Hypothyroidism   . OSTEOPOROSIS 05/06/2007   Qualifier: Diagnosis of  By: Arnoldo Morale MD,  Balinda Quails   . Parathyroid hormone excess ([place]) 08/31/2013   Secondary to low dietary calcium/calcium malabsorption   . PLANTAR FASCIITIS, LEFT 07/05/2009   Qualifier: Diagnosis of  By: Arnoldo Morale MD, [place] 02/25/2009   Qualifier: Diagnosis of  By: Arnoldo Morale MD, Abelino Derrick   . UNSPECIFIED IRON DEFICIENCY ANEMIA 01/28/2009   Qualifier: Diagnosis of  By: Arnoldo Morale MD, Balinda Quails VENOUS INSUFFICIENCY, CHRONIC 04/28/2007   Qualifier: Diagnosis of  By: Sherren Mocha RN, [place]     BP 116/75   Pulse 88   Temp 98.4 F (36.9 C)   Ht 5\' 3"  (1.6 m)   Wt 224 lb (101.6 kg)   SpO2 94%   BMI 39.68 kg/m   Opioid Risk Score:   Fall Risk Score:  `1  Depression screen PHQ 2/9  Depression screen [facility] 2/9 12/01/2020 10/14/2020 04/12/2020 10/14/2019 03/16/2019 10/30/2018 05/01/2018  Decreased Interest 3 0 0 0 0 2 0  Down, Depressed, Hopeless 2 0 1 0 0 1 2  PHQ - 2 Score 5 0 1 0 0 3 2  Altered sleeping 3 0 1 1 1 3 2   Tired, decreased energy 2 0 1 1 1 3 2   Change in appetite 3 0 0 1 0 1 1  Feeling bad or failure about yourself  0 0 0 0 0 0 1  Trouble concentrating 3 0 0 0 1 2 0  Moving slowly or fidgety/restless 1 0 0 0 0 1 0  Suicidal thoughts 0 0 0 0 0 0 0  PHQ-9 Score 17 0 3 3 3 13 8   Difficult doing work/chores - - Not difficult at all Not difficult at all Not difficult at all Somewhat difficult Not difficult at all  Some recent data might be hidden   Review of Systems  Constitutional: Positive for unexpected weight change.       Weight gain  Respiratory: Positive for cough.   Cardiovascular: Positive for leg swelling.  Endocrine:       Night sweats  Musculoskeletal: Positive for back pain and gait problem.       Upper legs & buttock pain  Neurological: Positive for tremors, weakness and numbness.  Psychiatric/Behavioral:       Depression  All other systems reviewed and are negative.      Objective:   Physical Exam  Gen: no distress,  normal appearing, BMI 38.62, weight 225lbs HEENT:  oral mucosa pink and moist, NCAT Cardio: Reg rate Chest: normal effort, normal rate of breathing Abd: soft, non-distended Ext: no edema Psych: pleasant, normal affect Skin: intact Neuro: Alert and oriented x3 Musculoskeletal: 5/5 strength throughout Lower back TTP  Worse with extension     Assessment & Plan:  Mrs. Steinberger is a 75 year old woman who presents to establish care for lower back pain that radiates into bilateral lower extremities.  1) Lumbar spinal stenosis with neurogenic claudication, bilateral -Discussed current symptoms of pain and history of pain.  -Discussed benefits of exercise in reducing pain. -Prescribed Tizanidine 2mg  HS for muscle spasm. Discussed side effects -Prescribed PT for strengthening, balance.  -Discussed benefits of exercise in reducing pain. -Discussed following foods that may reduce pain: 1) Ginger (especially studied for arthritis)- reduce leukotriene production to decrease inflammation 2) Blueberries- high in phytonutrients that decrease inflammation 3) Salmon- marine omega-3s reduce joint swelling and pain 4) Pumpkin seeds- reduce inflammation 5) dark chocolate- reduces inflammation 6) turmeric- reduces inflammation 7) tart cherries - reduce pain and stiffness 8) extra virgin olive oil - its compound olecanthal helps to block prostaglandins  9) chili peppers- can be eaten or applied topically via capsaicin 10) mint- helpful for headache, muscle aches, joint pain, and itching 11) garlic- reduces inflammation  Link to further information on diet for chronic pain: [email or web address].randall.[email or web address]/  2) Obesity: -BMI 38.62, 224 lbs -discussed current diet and role of inflammation in pain -discussed what foods to avoid, and what foods to increase in diet -explained that dietary changes and improved mobility were key to recovery, and medications just a bandaid for pain  3) Depressed due  to her pain: - discussed cymbalta if tizanidine does not help.

## 2021-01-26 NOTE — Therapy (Signed)
[place], [place], [address] Phone: [phone]   Fax:  [phone]  Physical Therapy Treatment  Patient Details  Name: Leslie Edwards MRN: [id number] Date of Birth: December 31, 1945 Referring Provider (PT): Ranell Patrick Clide Deutscher, MD   Encounter Date: 01/26/2021   PT End of Session - 01/26/21 1404    Visit Number 7    Number of Visits 25    Date for PT Re-Evaluation 03/16/21    Authorization Type BCBS MCR    Progress Note Due on Visit 10    PT Start Time 7124    PT Stop Time 1446    PT Time Calculation (min) 44 min    Activity Tolerance Patient tolerated treatment well;No increased pain    Behavior During Therapy WFL for tasks assessed/performed           Past Medical History:  Diagnosis Date  . Allergy   . ANEMIA, B12 DEFICIENCY 01/28/2009   Qualifier: Diagnosis of  By: Arnoldo Morale MD, Balinda Quails   . Anxiety   . Asthma   . GERD (gastroesophageal reflux disease)   . Hypertension   . Hypothyroidism   . OSTEOPOROSIS 05/06/2007   Qualifier: Diagnosis of  By: Arnoldo Morale MD, Balinda Quails   . Parathyroid hormone excess ([place]) 08/31/2013   Secondary to low dietary calcium/calcium malabsorption   . PLANTAR FASCIITIS, LEFT 07/05/2009   Qualifier: Diagnosis of  By: Arnoldo Morale MD, [place] 02/25/2009   Qualifier: Diagnosis of  By: Arnoldo Morale MD, Abelino Derrick   . UNSPECIFIED IRON DEFICIENCY ANEMIA 01/28/2009   Qualifier: Diagnosis of  By: Arnoldo Morale MD, Balinda Quails VENOUS INSUFFICIENCY, CHRONIC 04/28/2007   Qualifier: Diagnosis of  By: Scherrie Gerlach      Past Surgical History:  Procedure Laterality Date  . BREAST BIOPSY Left   . BREAST EXCISIONAL BIOPSY Right   . CARPAL TUNNEL RELEASE    . KNEE ARTHROSCOPY     right  . LIPOMA EXCISION  1965   axilary  . nissan fundiplication    . TONSILLECTOMY      There were no vitals filed for this visit.   Subjective Assessment - 01/26/21 1405    Subjective Pt presents to  PT with reports of continued lower back pain. She had positive f/u with MD today, with change in medication noted. Pt has been compliant with her HEP with no adverse effect. She is ready to begin PT treamtent at this time.    Currently in Pain? Yes    Pain Score 5     Pain Location Back    Pain Orientation Lower              [facility] PT Assessment - 01/26/21 0001      Observation/Other Assessments   Focus on Therapeutic Outcomes (FOTO)  30% function                         [facility] Adult PT Treatment/Exercise - 01/26/21 0001      Lumbar Exercises: Stretches   Lower Trunk Rotation 5 reps;10 seconds      Lumbar Exercises: Aerobic   Nustep lvl 5 UE/LE x 4 min while taking subjective      Lumbar Exercises: Seated   Long Arc Quad on Chair 2 sets;10 reps    LAQ on Chair Weights (lbs) 2.5    Other Seated Lumbar Exercises seated ball rollouts  x 15 green physio ball      Lumbar Exercises: Supine   Pelvic Tilt 10 reps;5 seconds    Pelvic Tilt Limitations supine PPT w/ ball squeeze 2x10 - 5 sec hold    Bent Knee Raise 10 reps    Bent Knee Raise Limitations x2 with PPT    Isometric Hip Flexion Limitations 90/90 table top hold 5x5 sec    Other Supine Lumbar Exercises supine PPT w/ clamshell 2x15 blue tband                    PT Short Term Goals - 01/26/21 1433      PT SHORT TERM GOAL #1   Title Pt will be compliant and knowledgeable with 90% of HEP in order to improve carryover    Time 3    Period Weeks    Status Achieved    Target Date 01/12/21      PT SHORT TERM GOAL #2   Title Pt will be able to perform sit>stand transfer independently with no increase in LBP or UE support in order to improve functional mobility    Baseline Mod I  with use of BUE; CGA from raised height and no UE support    Time 3    Period Weeks    Status Achieved    Target Date 01/12/21             PT Long Term Goals - 01/26/21 1433      PT LONG TERM GOAL #1   Title Pt will  increase FOTO functional score to no less than 46% in order to improve confidence and functional ability    Baseline 23% functional score at evaluation; update (01/26/21): 30% function    Time 12    Period Weeks    Status On-going      PT LONG TERM GOAL #2   Title Pt will increase standing tolerance to 30 minutes in order to improve ADL performance and functional ability    Baseline 5 minutes at present    Time 12    Period Weeks    Status On-going      PT LONG TERM GOAL #3   Title Pt will be able to ambulate 1072ft with no increase in LBP in order to improve comfort and functional mobility with community activities    Baseline pain with general ambulation at 50ft    Time 12    Period Weeks    Status On-going      PT LONG TERM GOAL #4   Title Pt will self report LBP no greater than 3/10 at worst in order to improve comfort and functional ability    Baseline 9/10 at worst    Time 12    Period Weeks    Status On-going                 Plan - 01/26/21 1439    Clinical Impression Statement Pt was able to complete all prescribed exercises with no adverse effect or increase in pain. Her FOTO score has improved since initial evaluation, increasing from 23% to 30% today, making progress on LTG. Pt overall is progressing well with therapy, with decreased LBP and improving LE strength and functional mobility. PT will continue to progress as able per POC as prescribed.    PT Treatment/Interventions ADLs/Self Care Home Management;Electrical Stimulation;Moist Heat;Ultrasound;Traction;Gait training;Stair training;Therapeutic activities;Therapeutic exercise;Neuromuscular re-education;Balance training;Patient/family education;Manual techniques;Passive range of motion;Dry needling;Spinal Manipulations;Joint Manipulations    PT Next Visit Plan  continue to progress exercises as able    PT Home Exercise Plan Access Code [email or web address]           Patient will benefit from skilled therapeutic  intervention in order to improve the following deficits and impairments:  Abnormal gait,Decreased activity tolerance,Decreased balance,Decreased endurance,Decreased range of motion,Decreased strength,Difficulty walking,Pain  Visit Diagnosis: Muscle weakness (generalized)  Impaired gait  Low back pain, non-specific     Problem List Patient Active Problem List   Diagnosis Date Noted  . Encounter for general adult medical examination with abnormal findings 11/09/2020  . DDD (degenerative disc disease), lumbar 10/14/2020  . Hyperlipidemia with target LDL less than 100 10/15/2019  . Routine general medical examination at a health care facility 10/14/2019  . Chronic gout due to renal impairment of multiple sites without tophus 04/13/2019  . LVH (left ventricular hypertrophy) due to hypertensive disease, with heart failure ([place]) 11/20/2018  . Diastolic dysfunction with chronic heart failure ([place]) 11/20/2018  . Esophageal dysphagia 10/30/2018  . Primary osteoarthritis of both knees 08/28/2016  . Radiculitis of right cervical region 07/30/2016  . Ocular rosacea 12/21/2015  . Pure hyperglyceridemia 11/01/2015  . Nonspecific abnormal electrocardiogram (ECG) (EKG) 10/31/2015  . Nuclear sclerosis of both eyes 01/05/2015  . Heart murmur, systolic 09/73/5329  . Depression with anxiety 07/22/2014  . Asthma, intermittent 06/03/2014  . Visit for screening mammogram 06/03/2014  . Spinal stenosis, lumbar region, with neurogenic claudication 06/03/2014  . Kidney disease, chronic, stage IV (GFR 15-29 ml/min) ([place]) 06/03/2014  . Parathyroid hormone excess ([place]) 08/31/2013  . Celiac disease 08/18/2007  . Essential hypertension 05/06/2007  . Allergic rhinitis 05/06/2007  . OSTEOPOROSIS 05/06/2007  . Other specified hypothyroidism 04/28/2007  . Morbid obesity ([place]) 04/28/2007  . GERD 04/28/2007    Ward Chatters, PT, DPT 01/26/21 3:01 PM  [facility] [address] [place], [place], [address] Phone: [phone]   Fax:  [phone]  Name: Leslie Edwards MRN: [id number] Date of Birth: February 16, 1946

## 2021-01-30 ENCOUNTER — Ambulatory Visit: Payer: Medicare Other

## 2021-01-30 ENCOUNTER — Other Ambulatory Visit: Payer: Self-pay

## 2021-01-30 DIAGNOSIS — R269 Unspecified abnormalities of gait and mobility: Secondary | ICD-10-CM | POA: Diagnosis not present

## 2021-01-30 DIAGNOSIS — M545 Low back pain, unspecified: Secondary | ICD-10-CM | POA: Diagnosis not present

## 2021-01-30 DIAGNOSIS — M6281 Muscle weakness (generalized): Secondary | ICD-10-CM | POA: Diagnosis not present

## 2021-01-30 NOTE — Therapy (Signed)
[place], [place], [address] Phone: [phone]   Fax:  [phone]  Physical Therapy Treatment  Patient Details  Name: Leslie Edwards MRN: [id number] Date of Birth: 20-Mar-1946 Referring Provider (PT): Ranell Patrick, Clide Deutscher, MD   Encounter Date: 01/30/2021   PT End of Session - 01/30/21 1004    Visit Number 8    Number of Visits 25    Date for PT Re-Evaluation 03/16/21    Authorization Type BCBS MCR    Progress Note Due on Visit 10    PT Start Time 1000    PT Stop Time 1045    PT Time Calculation (min) 45 min    Activity Tolerance Patient tolerated treatment well;No increased pain    Behavior During Therapy WFL for tasks assessed/performed           Past Medical History:  Diagnosis Date  . Allergy   . ANEMIA, B12 DEFICIENCY 01/28/2009   Qualifier: Diagnosis of  By: Arnoldo Morale MD, Balinda Quails   . Anxiety   . Asthma   . GERD (gastroesophageal reflux disease)   . Hypertension   . Hypothyroidism   . OSTEOPOROSIS 05/06/2007   Qualifier: Diagnosis of  By: Arnoldo Morale MD, Balinda Quails   . Parathyroid hormone excess ([place]) 08/31/2013   Secondary to low dietary calcium/calcium malabsorption   . PLANTAR FASCIITIS, LEFT 07/05/2009   Qualifier: Diagnosis of  By: Arnoldo Morale MD, [place] 02/25/2009   Qualifier: Diagnosis of  By: Arnoldo Morale MD, Abelino Derrick   . UNSPECIFIED IRON DEFICIENCY ANEMIA 01/28/2009   Qualifier: Diagnosis of  By: Arnoldo Morale MD, Balinda Quails VENOUS INSUFFICIENCY, CHRONIC 04/28/2007   Qualifier: Diagnosis of  By: Scherrie Gerlach      Past Surgical History:  Procedure Laterality Date  . BREAST BIOPSY Left   . BREAST EXCISIONAL BIOPSY Right   . CARPAL TUNNEL RELEASE    . KNEE ARTHROSCOPY     right  . LIPOMA EXCISION  1965   axilary  . nissan fundiplication    . TONSILLECTOMY      There were no vitals filed for this visit.   Subjective Assessment - 01/30/21 1005    Subjective Pt presents to  PT with reports of increased lower back pain. Pt awoke last Saturday morning and had very sharp increase in pain. She has had modification in medication, but was unable to get lidocaine patch d/t insurance. Pt is ready to begin PT treatment at this time.    Currently in Pain? Yes    Pain Score 8     Pain Location Back    Pain Orientation Lower    Pain Descriptors / Indicators Other (Comment)   grabbing                            [facility] Adult PT Treatment/Exercise - 01/30/21 0001      Lumbar Exercises: Stretches   Lower Trunk Rotation Limitations x 10 ea    Other Lumbar Stretch Exercise fwd ball rollouts 2x15 seated green physioball      Lumbar Exercises: Seated   Long Arc Quad on Chair 2 sets;10 reps;Both    LAQ on Chair Weights (lbs) 3      Lumbar Exercises: Supine   Pelvic Tilt 10 reps;5 seconds    Pelvic Tilt Limitations supine PPT w/ ball squeeze 2x10 - 5 sec hold  Bent Knee Raise 10 reps    Bent Knee Raise Limitations x2 with PPT    Other Supine Lumbar Exercises clamshell 2x15 blue tband      Modalities   Modalities Moist Heat      Moist Heat Therapy   Number Minutes Moist Heat --   used on lumbar spine while performing supine exercises   Moist Heat Location Lumbar Spine                    PT Short Term Goals - 01/26/21 1433      PT SHORT TERM GOAL #1   Title Pt will be compliant and knowledgeable with 90% of HEP in order to improve carryover    Time 3    Period Weeks    Status Achieved    Target Date 01/12/21      PT SHORT TERM GOAL #2   Title Pt will be able to perform sit>stand transfer independently with no increase in LBP or UE support in order to improve functional mobility    Baseline Mod I  with use of BUE; CGA from raised height and no UE support    Time 3    Period Weeks    Status Achieved    Target Date 01/12/21             PT Long Term Goals - 01/26/21 1433      PT LONG TERM GOAL #1   Title Pt will increase FOTO  functional score to no less than 46% in order to improve confidence and functional ability    Baseline 23% functional score at evaluation; update (01/26/21): 30% function    Time 12    Period Weeks    Status On-going      PT LONG TERM GOAL #2   Title Pt will increase standing tolerance to 30 minutes in order to improve ADL performance and functional ability    Baseline 5 minutes at present    Time 12    Period Weeks    Status On-going      PT LONG TERM GOAL #3   Title Pt will be able to ambulate 1040ft with no increase in LBP in order to improve comfort and functional mobility with community activities    Baseline pain with general ambulation at 25ft    Time 12    Period Weeks    Status On-going      PT LONG TERM GOAL #4   Title Pt will self report LBP no greater than 3/10 at worst in order to improve comfort and functional ability    Baseline 9/10 at worst    Time 12    Period Weeks    Status On-going                 Plan - 01/30/21 1041    Clinical Impression Statement Pt tolerated treatment fair today, but was somewhat limited by increased LBP. She responded well to MHP at lumbar spine in supine along with general lumbar AROM and LE strengthening. Continued pain has made progression of exercises somewhat difficult, but pt does appear to continue to respond favorably to PT. Will assess response to today's interventions at next session and progress as tolerated.    PT Treatment/Interventions ADLs/Self Care Home Management;Electrical Stimulation;Moist Heat;Ultrasound;Traction;Gait training;Stair training;Therapeutic activities;Therapeutic exercise;Neuromuscular re-education;Balance training;Patient/family education;Manual techniques;Passive range of motion;Dry needling;Spinal Manipulations;Joint Manipulations    PT Next Visit Plan continue to progress exercises as able    PT Home  Exercise Plan Access Code [email or web address]           Patient will benefit from skilled therapeutic  intervention in order to improve the following deficits and impairments:  Abnormal gait,Decreased activity tolerance,Decreased balance,Decreased endurance,Decreased range of motion,Decreased strength,Difficulty walking,Pain  Visit Diagnosis: Muscle weakness (generalized)  Impaired gait  Low back pain, non-specific     Problem List Patient Active Problem List   Diagnosis Date Noted  . Encounter for general adult medical examination with abnormal findings 11/09/2020  . DDD (degenerative disc disease), lumbar 10/14/2020  . Hyperlipidemia with target LDL less than 100 10/15/2019  . Routine general medical examination at a health care facility 10/14/2019  . Chronic gout due to renal impairment of multiple sites without tophus 04/13/2019  . LVH (left ventricular hypertrophy) due to hypertensive disease, with heart failure ([place]) 11/20/2018  . Diastolic dysfunction with chronic heart failure ([place]) 11/20/2018  . Esophageal dysphagia 10/30/2018  . Primary osteoarthritis of both knees 08/28/2016  . Radiculitis of right cervical region 07/30/2016  . Ocular rosacea 12/21/2015  . Pure hyperglyceridemia 11/01/2015  . Nonspecific abnormal electrocardiogram (ECG) (EKG) 10/31/2015  . Nuclear sclerosis of both eyes 01/05/2015  . Heart murmur, systolic 92/42/6834  . Depression with anxiety 07/22/2014  . Asthma, intermittent 06/03/2014  . Visit for screening mammogram 06/03/2014  . Spinal stenosis, lumbar region, with neurogenic claudication 06/03/2014  . Kidney disease, chronic, stage IV (GFR 15-29 ml/min) ([place]) 06/03/2014  . Parathyroid hormone excess ([place]) 08/31/2013  . Celiac disease 08/18/2007  . Essential hypertension 05/06/2007  . Allergic rhinitis 05/06/2007  . OSTEOPOROSIS 05/06/2007  . Other specified hypothyroidism 04/28/2007  . Morbid obesity ([place]) 04/28/2007  . GERD 04/28/2007    Ward Chatters, PT, DPT 01/30/21 11:36 AM  [facility] [address] [place], [place], [address] Phone: [phone]   Fax:  [phone]  Name: Leslie Edwards MRN: [id number] Date of Birth: [birth year]

## 2021-02-02 ENCOUNTER — Ambulatory Visit: Payer: Medicare Other | Admitting: Physical Therapy

## 2021-02-02 ENCOUNTER — Other Ambulatory Visit: Payer: Self-pay

## 2021-02-02 ENCOUNTER — Encounter: Payer: Self-pay | Admitting: Physical Therapy

## 2021-02-02 DIAGNOSIS — R269 Unspecified abnormalities of gait and mobility: Secondary | ICD-10-CM | POA: Diagnosis not present

## 2021-02-02 DIAGNOSIS — M6281 Muscle weakness (generalized): Secondary | ICD-10-CM

## 2021-02-02 DIAGNOSIS — M545 Low back pain, unspecified: Secondary | ICD-10-CM | POA: Diagnosis not present

## 2021-02-02 NOTE — Therapy (Signed)
[place], [place], [address] Phone: [phone]   Fax:  [phone]  Physical Therapy Treatment  Patient Details  Name: Leslie Edwards MRN: [id number] Date of Birth: 1946/04/02 Referring Provider (PT): Ranell Patrick, Clide Deutscher, MD   Encounter Date: 02/02/2021   PT End of Session - 02/02/21 1001    Visit Number 9    Number of Visits 25    Date for PT Re-Evaluation 03/16/21    Authorization Type BCBS MCR    Progress Note Due on Visit 10    PT Start Time 1000    PT Stop Time 1040    PT Time Calculation (min) 40 min    Activity Tolerance Patient tolerated treatment well;No increased pain    Behavior During Therapy WFL for tasks assessed/performed           Past Medical History:  Diagnosis Date  . Allergy   . ANEMIA, B12 DEFICIENCY 01/28/2009   Qualifier: Diagnosis of  By: Arnoldo Morale MD, Balinda Quails   . Anxiety   . Asthma   . GERD (gastroesophageal reflux disease)   . Hypertension   . Hypothyroidism   . OSTEOPOROSIS 05/06/2007   Qualifier: Diagnosis of  By: Arnoldo Morale MD, Balinda Quails   . Parathyroid hormone excess ([place]) 08/31/2013   Secondary to low dietary calcium/calcium malabsorption   . PLANTAR FASCIITIS, LEFT 07/05/2009   Qualifier: Diagnosis of  By: Arnoldo Morale MD, [place] 02/25/2009   Qualifier: Diagnosis of  By: Arnoldo Morale MD, Abelino Derrick   . UNSPECIFIED IRON DEFICIENCY ANEMIA 01/28/2009   Qualifier: Diagnosis of  By: Arnoldo Morale MD, Balinda Quails VENOUS INSUFFICIENCY, CHRONIC 04/28/2007   Qualifier: Diagnosis of  By: Scherrie Gerlach      Past Surgical History:  Procedure Laterality Date  . BREAST BIOPSY Left   . BREAST EXCISIONAL BIOPSY Right   . CARPAL TUNNEL RELEASE    . KNEE ARTHROSCOPY     right  . LIPOMA EXCISION  1965   axilary  . nissan fundiplication    . TONSILLECTOMY      There were no vitals filed for this visit.   Subjective Assessment - 02/02/21 1004    Subjective Pt reports that  she is having significant low back pain this morning.  She feels her pain level is typical.  She reports that PT has been improving her symptoms.  Her sxs are improved from last session.    Currently in Pain? Yes    Pain Score 5     Pain Location Back    Pain Orientation Lower                             [facility] Adult PT Treatment/Exercise - 02/02/21 0001      Lumbar Exercises: Stretches   Lower Trunk Rotation Limitations x 10 ea      Lumbar Exercises: Aerobic   Nustep lvl 5 UE/LE x 5 min while taking subjective      Lumbar Exercises: Seated   Long Arc Quad on Chair 2 sets;10 reps;Both    LAQ on Chair Weights (lbs) 4    Other Seated Lumbar Exercises seated sciatic nerve glide x15 ea      Lumbar Exercises: Supine   Pelvic Tilt 10 reps;5 seconds    Pelvic Tilt Limitations supine PPT w/ ball squeeze 2x10 - 5 sec hold  Bent Knee Raise 10 reps    Bent Knee Raise Limitations x2 with PPT (with kick out)    Bridge 10 reps    Bridge Limitations x2; partial range    Other Supine Lumbar Exercises clamshell 3x10 atlernating                    PT Short Term Goals - 01/26/21 1433      PT SHORT TERM GOAL #1   Title Pt will be compliant and knowledgeable with 90% of HEP in order to improve carryover    Time 3    Period Weeks    Status Achieved    Target Date 01/12/21      PT SHORT TERM GOAL #2   Title Pt will be able to perform sit>stand transfer independently with no increase in LBP or UE support in order to improve functional mobility    Baseline Mod I  with use of BUE; CGA from raised height and no UE support    Time 3    Period Weeks    Status Achieved    Target Date 01/12/21             PT Long Term Goals - 01/26/21 1433      PT LONG TERM GOAL #1   Title Pt will increase FOTO functional score to no less than 46% in order to improve confidence and functional ability    Baseline 23% functional score at evaluation; update (01/26/21): 30% function     Time 12    Period Weeks    Status On-going      PT LONG TERM GOAL #2   Title Pt will increase standing tolerance to 30 minutes in order to improve ADL performance and functional ability    Baseline 5 minutes at present    Time 12    Period Weeks    Status On-going      PT LONG TERM GOAL #3   Title Pt will be able to ambulate 1018ft with no increase in LBP in order to improve comfort and functional mobility with community activities    Baseline pain with general ambulation at 20ft    Time 12    Period Weeks    Status On-going      PT LONG TERM GOAL #4   Title Pt will self report LBP no greater than 3/10 at worst in order to improve comfort and functional ability    Baseline 9/10 at worst    Time 12    Period Weeks    Status On-going                 Plan - 02/02/21 1034    Clinical Impression Statement Pt is making good progress with therapy.  We were able to progress intensity of several exercises.  She shows fatigue with mat exercises but is able to complete with good form with minimal cuing.  Concentration was on core activation with LE movement.  Encouraged her to practice abdominal bracing/PPT while sitting throughout day.  Will continue to progress core strength as tolerated.    PT Treatment/Interventions ADLs/Self Care Home Management;Electrical Stimulation;Moist Heat;Ultrasound;Traction;Gait training;Stair training;Therapeutic activities;Therapeutic exercise;Neuromuscular re-education;Balance training;Patient/family education;Manual techniques;Passive range of motion;Dry needling;Spinal Manipulations;Joint Manipulations    PT Next Visit Plan continue to progress exercises as able    PT Home Exercise Plan Access Code [email or web address]           Patient will benefit from skilled therapeutic intervention in order to improve  the following deficits and impairments:  Abnormal gait,Decreased activity tolerance,Decreased balance,Decreased endurance,Decreased range of  motion,Decreased strength,Difficulty walking,Pain  Visit Diagnosis: Muscle weakness (generalized)  Impaired gait  Low back pain, non-specific     Problem List Patient Active Problem List   Diagnosis Date Noted  . Encounter for general adult medical examination with abnormal findings 11/09/2020  . DDD (degenerative disc disease), lumbar 10/14/2020  . Hyperlipidemia with target LDL less than 100 10/15/2019  . Routine general medical examination at a health care facility 10/14/2019  . Chronic gout due to renal impairment of multiple sites without tophus 04/13/2019  . LVH (left ventricular hypertrophy) due to hypertensive disease, with heart failure ([place]) 11/20/2018  . Diastolic dysfunction with chronic heart failure ([place]) 11/20/2018  . Esophageal dysphagia 10/30/2018  . Primary osteoarthritis of both knees 08/28/2016  . Radiculitis of right cervical region 07/30/2016  . Ocular rosacea 12/21/2015  . Pure hyperglyceridemia 11/01/2015  . Nonspecific abnormal electrocardiogram (ECG) (EKG) 10/31/2015  . Nuclear sclerosis of both eyes 01/05/2015  . Heart murmur, systolic 24/23/5361  . Depression with anxiety 07/22/2014  . Asthma, intermittent 06/03/2014  . Visit for screening mammogram 06/03/2014  . Spinal stenosis, lumbar region, with neurogenic claudication 06/03/2014  . Kidney disease, chronic, stage IV (GFR 15-29 ml/min) ([place]) 06/03/2014  . Parathyroid hormone excess ([place]) 08/31/2013  . Celiac disease 08/18/2007  . Essential hypertension 05/06/2007  . Allergic rhinitis 05/06/2007  . OSTEOPOROSIS 05/06/2007  . Other specified hypothyroidism 04/28/2007  . Morbid obesity ([place]) 04/28/2007  . GERD 04/28/2007    Mathis Dad 02/02/2021, 12:24 PM  [facility] [address] [place], [place], [address] Phone: [phone]   Fax:  [phone]  Name: CHELISA HENNEN MRN: [id number] Date of Birth: 02/11/1946

## 2021-02-06 ENCOUNTER — Ambulatory Visit: Payer: Medicare Other

## 2021-02-06 ENCOUNTER — Other Ambulatory Visit: Payer: Self-pay

## 2021-02-06 DIAGNOSIS — M6281 Muscle weakness (generalized): Secondary | ICD-10-CM | POA: Diagnosis not present

## 2021-02-06 DIAGNOSIS — R269 Unspecified abnormalities of gait and mobility: Secondary | ICD-10-CM

## 2021-02-06 DIAGNOSIS — M545 Low back pain, unspecified: Secondary | ICD-10-CM | POA: Diagnosis not present

## 2021-02-06 NOTE — Therapy (Addendum)
[place], [place], [address] Phone: [phone]   Fax:  [phone]  PHYSICAL THERAPY UNPLANNED DISCHARGE SUMMARY   Visits from Start of Care: 10  Current functional level related to goals / functional outcomes: Current status unknown   Remaining deficits: Current status unknown   Education / Equipment: Pt has not returned since visit listed below  Patient goals were not assessed. Patient is being discharged due to not returning since the last visit.  (the below note was addended to include the above D/C summary on 06/27/21)   Physical Therapy Treatment/Progress  Progress Note Reporting Period 12/22/20 to 02/06/21  See note below for Objective Data and Assessment of Progress/Goals.    Patient Details  Name: Leslie Edwards MRN: [id number] Date of Birth: 20-Jan-1946 Referring Provider (PT): Ranell Patrick, Clide Deutscher, MD   Encounter Date: 02/06/2021   PT End of Session - 02/06/21 0957     Visit Number 10    Number of Visits 25    Date for PT Re-Evaluation 03/16/21    Authorization Type BCBS MCR    Progress Note Due on Visit 10    PT Start Time 1000    PT Stop Time 1042    PT Time Calculation (min) 42 min    Activity Tolerance Patient tolerated treatment well;No increased pain    Behavior During Therapy [facility] for tasks assessed/performed             Past Medical History:  Diagnosis Date   Allergy    ANEMIA, B12 DEFICIENCY 01/28/2009   Qualifier: Diagnosis of  By: Arnoldo Morale MD, Balinda Quails    Anxiety    Asthma    GERD (gastroesophageal reflux disease)    Hypertension    Hypothyroidism    OSTEOPOROSIS 05/06/2007   Qualifier: Diagnosis of  By: Arnoldo Morale MD, John E    Parathyroid hormone excess ([place]) 08/31/2013   Secondary to low dietary calcium/calcium malabsorption    PLANTAR FASCIITIS, LEFT 07/05/2009   Qualifier: Diagnosis of  By: Arnoldo Morale MD, Balinda Quails    Rosacea 02/25/2009   Qualifier: Diagnosis of   By: Arnoldo Morale MD, John E    [place]    UNSPECIFIED IRON DEFICIENCY ANEMIA 01/28/2009   Qualifier: Diagnosis of  By: Arnoldo Morale MD, John E    VENOUS INSUFFICIENCY, CHRONIC 04/28/2007   Qualifier: Diagnosis of  By: Scherrie Gerlach      Past Surgical History:  Procedure Laterality Date   BREAST BIOPSY Left    BREAST EXCISIONAL BIOPSY Right    CARPAL TUNNEL RELEASE     KNEE ARTHROSCOPY     right   LIPOMA EXCISION  1965   axilary   nissan fundiplication     TONSILLECTOMY      There were no vitals filed for this visit.   Subjective Assessment - 02/06/21 0957     Subjective Pt reports some significant LBP this morning after decreased pain over the weekend. She continues to report HEP compliance with no adverse effect. Pt is ready to begin PT treatment at this time.    How long can you stand comfortably? 20 minutes    Currently in Pain? No/denies    Pain Score 7     Pain Location Abdomen    Pain Orientation Lower                [facility] PT Assessment - 02/06/21 0001       Observation/Other Assessments   Focus on Therapeutic Outcomes (  FOTO)  28% function                           [facility] Adult PT Treatment/Exercise - 02/06/21 0001       Lumbar Exercises: Stretches   Other Lumbar Stretch Exercise fwd ball rollouts 2x15 seated green physioball      Lumbar Exercises: Aerobic   Nustep lvl 5 UE/LE x 5 min while taking subjective      Lumbar Exercises: Seated   Long Arc Quad on Chair 2 sets;15 reps    LAQ on Chair Weights (lbs) 3    Other Seated Lumbar Exercises hamstring curl blue tband 2x10 ea      Lumbar Exercises: Supine   Pelvic Tilt 10 reps;5 seconds    Pelvic Tilt Limitations supine PPT w/ ball squeeze 2x10 - 5 sec hold    Bridge 10 reps    Bridge Limitations x2; partial range    Other Supine Lumbar Exercises PPT w/ clamshell black tband 2x15    Other Supine Lumbar Exercises PPT with march x 10                      PT Short Term Goals -  01/26/21 1433       PT SHORT TERM GOAL #1   Title Pt will be compliant and knowledgeable with 90% of HEP in order to improve carryover    Time 3    Period Weeks    Status Achieved    Target Date 01/12/21      PT SHORT TERM GOAL #2   Title Pt will be able to perform sit>stand transfer independently with no increase in LBP or UE support in order to improve functional mobility    Baseline Mod I  with use of BUE; CGA from raised height and no UE support    Time 3    Period Weeks    Status Achieved    Target Date 01/12/21               PT Long Term Goals - 02/06/21 1345       PT LONG TERM GOAL #1   Title Pt will increase FOTO functional score to no less than 46% in order to improve confidence and functional ability    Baseline 23% functional score at evaluation; update (01/26/21): 30% function; (02/06/21): 28% function    Time 12    Period Weeks    Status On-going    Target Date 03/16/21      PT LONG TERM GOAL #2   Title Pt will increase standing tolerance to 30 minutes in order to improve ADL performance and functional ability    Baseline 5 minutes at present; update - 20 minutes per pt on 02/06/21    Time 12    Period Weeks    Status Partially Met    Target Date 03/16/21      PT LONG TERM GOAL #3   Title Pt will be able to ambulate 1058f with no increase in LBP in order to improve comfort and functional mobility with community activities    Baseline pain with general ambulation at 524f   Time 12    Period Weeks    Status On-going    Target Date 03/16/21      PT LONG TERM GOAL #4   Title Pt will self report LBP no greater than 3/10 at worst in order to improve comfort and  functional ability    Baseline 9/10 at worst    Time 12    Period Weeks    Status On-going    Target Date 03/16/21                   Plan - 02/06/21 1032     Clinical Impression Statement Pt was once again able to complete prescribed exercises with no adverse effect. She continues  to have significant LBP that affects functional ADLs and activities, although she subjective notes improvement with ability to do 20 minutes compared to 5 minutes at initial evaluation. Unfortunately, pt's FOTO score decreased today, with drop from 30% to 28% function, although this is still higher function that initial evaluation. Overall she continues to progress fair with therapy with therex progression limited by continued LBP. Will continue to progress exercises as able per POC as prescribed.    Personal Factors and Comorbidities Age;Comorbidity 2    Comorbidities See PMH    Examination-Activity Limitations Squat;Stairs;Stand;Sit;Carry;Bend;Transfers    Examination-Participation Restrictions Shop;Yard Work;Community Activity    PT Treatment/Interventions ADLs/Self Care Home Management;Electrical Stimulation;Moist Heat;Ultrasound;Traction;Gait training;Stair training;Therapeutic activities;Therapeutic exercise;Neuromuscular re-education;Balance training;Patient/family education;Manual techniques;Passive range of motion;Dry needling;Spinal Manipulations;Joint Manipulations    PT Next Visit Plan continue to progress exercises as able    PT Home Exercise Plan Access Code [email or web address]             Patient will benefit from skilled therapeutic intervention in order to improve the following deficits and impairments:  Abnormal gait,Decreased activity tolerance,Decreased balance,Decreased endurance,Decreased range of motion,Decreased strength,Difficulty walking,Pain  Visit Diagnosis: Muscle weakness (generalized)  Impaired gait  Low back pain, non-specific     Problem List Patient Active Problem List   Diagnosis Date Noted   Encounter for general adult medical examination with abnormal findings 11/09/2020   DDD (degenerative disc disease), lumbar 10/14/2020   Hyperlipidemia with target LDL less than 100 10/15/2019   Routine general medical examination at a health care facility 10/14/2019    Chronic gout due to renal impairment of multiple sites without tophus 04/13/2019   LVH (left ventricular hypertrophy) due to hypertensive disease, with heart failure ([place]) 96/28/3662   Diastolic dysfunction with chronic heart failure ([place]) 11/20/2018   Esophageal dysphagia 10/30/2018   Primary osteoarthritis of both knees 08/28/2016   Radiculitis of right cervical region 07/30/2016   Ocular rosacea 12/21/2015   Pure hyperglyceridemia 11/01/2015   Nonspecific abnormal electrocardiogram (ECG) (EKG) 10/31/2015   Nuclear sclerosis of both eyes 01/05/2015   Heart murmur, systolic 94/76/5465   Depression with anxiety 07/22/2014   Asthma, intermittent 06/03/2014   Visit for screening mammogram 06/03/2014   Spinal stenosis, lumbar region, with neurogenic claudication 06/03/2014   Kidney disease, chronic, stage IV (GFR 15-29 ml/min) ([place]) 06/03/2014   Parathyroid hormone excess ([place]) 08/31/2013   Celiac disease 08/18/2007   Essential hypertension 05/06/2007   Allergic rhinitis 05/06/2007   OSTEOPOROSIS 05/06/2007   Other specified hypothyroidism 04/28/2007   Morbid obesity ([place]) 04/28/2007   GERD 04/28/2007    Ward Chatters, PT, DPT 02/06/21 1:51 PM  [facility] [address] [place], [place], [address] Phone: [phone]   Fax:  [phone]  Name: JERALD VILLALONA MRN: [id number] Date of Birth: Jan 18, 1946

## 2021-02-07 ENCOUNTER — Ambulatory Visit (INDEPENDENT_AMBULATORY_CARE_PROVIDER_SITE_OTHER): Payer: Medicare Other | Admitting: Internal Medicine

## 2021-02-07 ENCOUNTER — Ambulatory Visit (INDEPENDENT_AMBULATORY_CARE_PROVIDER_SITE_OTHER): Payer: Medicare Other

## 2021-02-07 ENCOUNTER — Encounter: Payer: Self-pay | Admitting: Internal Medicine

## 2021-02-07 VITALS — BP 142/78 | HR 80 | Temp 98.1°F | Resp 16 | Ht 63.0 in | Wt 227.0 lb

## 2021-02-07 DIAGNOSIS — I10 Essential (primary) hypertension: Secondary | ICD-10-CM | POA: Diagnosis not present

## 2021-02-07 DIAGNOSIS — M5136 Other intervertebral disc degeneration, lumbar region: Secondary | ICD-10-CM

## 2021-02-07 DIAGNOSIS — J301 Allergic rhinitis due to pollen: Secondary | ICD-10-CM

## 2021-02-07 DIAGNOSIS — M5441 Lumbago with sciatica, right side: Secondary | ICD-10-CM

## 2021-02-07 DIAGNOSIS — J4520 Mild intermittent asthma, uncomplicated: Secondary | ICD-10-CM | POA: Diagnosis not present

## 2021-02-07 DIAGNOSIS — M170 Bilateral primary osteoarthritis of knee: Secondary | ICD-10-CM

## 2021-02-07 DIAGNOSIS — M48062 Spinal stenosis, lumbar region with neurogenic claudication: Secondary | ICD-10-CM

## 2021-02-07 DIAGNOSIS — R059 Cough, unspecified: Secondary | ICD-10-CM | POA: Diagnosis not present

## 2021-02-07 DIAGNOSIS — R053 Chronic cough: Secondary | ICD-10-CM | POA: Diagnosis not present

## 2021-02-07 DIAGNOSIS — M51369 Other intervertebral disc degeneration, lumbar region without mention of lumbar back pain or lower extremity pain: Secondary | ICD-10-CM

## 2021-02-07 DIAGNOSIS — M5442 Lumbago with sciatica, left side: Secondary | ICD-10-CM

## 2021-02-07 DIAGNOSIS — M5412 Radiculopathy, cervical region: Secondary | ICD-10-CM

## 2021-02-07 MED ORDER — IPRATROPIUM-ALBUTEROL 0.5-2.5 (3) MG/3ML IN SOLN: 3.0000 mL | RESPIRATORY_TRACT | 1 refills | Status: AC | PRN

## 2021-02-07 MED ORDER — OXYCODONE HCL 5 MG PO TABS: 5.0000 mg | ORAL_TABLET | Freq: Four times a day (QID) | ORAL | 0 refills | Status: DC | PRN

## 2021-02-07 MED ORDER — TRELEGY ELLIPTA 100-62.5-25 MCG/INH IN AEPB: 1.0000 | INHALATION_SPRAY | Freq: Every day | RESPIRATORY_TRACT | 1 refills | Status: DC

## 2021-02-07 NOTE — Progress Notes (Signed)
Subjective:  Patient ID: Leslie Edwards, female    DOB: April 02, 1946  Age: 75 y.o. MRN: [id number]  CC: Hypertension and Back Pain  This visit occurred during the SARS-CoV-2 public health emergency.  Safety protocols were in place, including screening questions prior to the visit, additional usage of staff PPE, and extensive cleaning of exam room while observing appropriate contact time as indicated for disinfecting solutions.    HPI [name] presents for f/up -   She complains of worsening low back pain that radiates into both buttocks.  She describes the pain is sharp and stabbing and it interferes with her activity and ability to rest.  She gets most of her symptom relief by taking tizanidine and oxycodone.  She has been seeing a physical medicine rehab doctor and is not getting much better.  She complains of chronic cough and is not currently using anything to treat asthma.  The cough is nonproductive.  She has chronic unchanged shortness of breath.  She denies wheezing, chest pain, or diaphoresis.  Outpatient Medications Prior to Visit  Medication Sig Dispense Refill  . albuterol (PROVENTIL HFA;VENTOLIN HFA) 108 (90 Base) MCG/ACT inhaler Inhale 2 puffs into the lungs every 6 (six) hours as needed. 18 g 11  . allopurinol (ZYLOPRIM) 100 MG tablet TAKE 1 TABLET ONCE DAILY. 90 tablet 1  . Ascorbic Acid (VITAMIN C) 1000 MG tablet Take 1,000 mg by mouth daily.    Marland Kitchen b complex vitamins tablet Take 1 tablet by mouth daily.    Marland Kitchen buPROPion (WELLBUTRIN XL) 150 MG 24 hr tablet TAKE 3 TABLETS DAILY. 270 tablet 1  . Colchicine (MITIGARE) 0.6 MG CAPS Take 1 tablet by mouth 2 (two) times daily. 180 capsule 0  . famotidine (PEPCID) 10 MG tablet Take 10 mg by mouth 2 (two) times daily as needed for heartburn or indigestion.    . irbesartan (AVAPRO) 150 MG tablet Take 1 tablet (150 mg total) by mouth daily. 90 tablet 1  . liothyronine (CYTOMEL) 25 MCG tablet TAKE 1 TABLET ONCE DAILY. 90 tablet 0  .  Multiple Vitamin (MULTIVITAMIN) tablet Take 1 tablet by mouth daily.    Marland Kitchen omega-3 acid ethyl esters (LOVAZA) 1 g capsule TAKE (2) CAPSULES BY MOUTH TWICE DAILY. 360 capsule 0  . potassium chloride (KLOR-CON) 10 MEQ tablet TAKE 1 TABLET BY MOUTH DAILY. 90 tablet 0  . pravastatin (PRAVACHOL) 20 MG tablet Take 20 mg by mouth daily.    Marland Kitchen tiZANidine (ZANAFLEX) 4 MG tablet Take 1 tablet (4 mg total) by mouth at bedtime. 30 tablet 3  . torsemide (DEMADEX) 20 MG tablet TAKE 1 TABLET ONCE DAILY. 30 tablet 5  . VIIBRYD 40 MG TABS TAKE 1 TABLET ONCE DAILY. 30 tablet 5  . budesonide-formoterol (SYMBICORT) 160-4.5 MCG/ACT inhaler Inhale 2 puffs into the lungs 2 (two) times daily. 1 Inhaler 11  . co-enzyme Q-10 30 MG capsule Take 30 mg by mouth 3 (three) times daily.    . hyoscyamine (LEVSIN SL) 0.125 MG SL tablet Place 1 tablet (0.125 mg total) under the tongue every 4 (four) hours as needed. 30 tablet 11  . ipratropium-albuterol (DUONEB) 0.5-2.5 (3) MG/3ML SOLN Take 3 mLs by nebulization as needed. 360 mL 1  . lidocaine (LIDODERM) 5 % Place 1 patch onto the skin daily. Remove & Discard patch within 12 hours or as directed by MD 30 patch 3  . oxyCODONE (OXY IR/ROXICODONE) 5 MG immediate release tablet Take 1 tablet (5 mg total) by  mouth every 6 (six) hours as needed for severe pain. 90 tablet 0  . levocetirizine (XYZAL) 5 MG tablet Take 2 tablets (10 mg total) by mouth every evening for 7 days. 14 tablet 0   No facility-administered medications prior to visit.    ROS Review of Systems  Constitutional: Positive for unexpected weight change (wt gain). Negative for appetite change, chills, diaphoresis and fatigue.  HENT: Negative.   Eyes: Negative.   Respiratory: Positive for cough and shortness of breath. Negative for chest tightness and wheezing.   Cardiovascular: Negative for chest pain, palpitations and leg swelling.  Gastrointestinal: Negative for abdominal pain, constipation, diarrhea, nausea and  vomiting.  Endocrine: Negative.   Genitourinary: Negative.  Negative for difficulty urinating.  Musculoskeletal: Positive for arthralgias, back pain and gait problem. Negative for myalgias.  Skin: Negative.  Negative for color change and pallor.  Neurological: Negative for dizziness, weakness and headaches.  Hematological: Negative for adenopathy. Does not bruise/bleed easily.  Psychiatric/Behavioral: Negative.     Objective:  BP (!) 142/78 (BP Location: Left Arm, Patient Position: Sitting, Cuff Size: Large)   Pulse 80   Temp 98.1 F (36.7 C) (Oral)   Resp 16   Ht 5\' 3"  (1.6 m)   Wt 227 lb (103 kg)   SpO2 98%   BMI 40.21 kg/m   BP Readings from Last 3 Encounters:  02/07/21 (!) 142/78  01/26/21 116/75  12/01/20 125/69    Wt Readings from Last 3 Encounters:  02/07/21 227 lb (103 kg)  01/26/21 224 lb (101.6 kg)  12/01/20 225 lb (102.1 kg)    Physical Exam Vitals reviewed.  HENT:     Nose: Nose normal.     Mouth/Throat:     Mouth: Mucous membranes are moist.  Eyes:     General: No scleral icterus.    Conjunctiva/sclera: Conjunctivae normal.  Cardiovascular:     Rate and Rhythm: Normal rate and regular rhythm.     Heart sounds: No murmur heard.   Pulmonary:     Effort: Pulmonary effort is normal.     Breath sounds: No stridor. No wheezing, rhonchi or rales.  Abdominal:     General: Abdomen is protuberant. Bowel sounds are normal. There is no distension.     Palpations: Abdomen is soft. There is no hepatomegaly, splenomegaly or mass.     Tenderness: There is no abdominal tenderness.  Musculoskeletal:        General: Normal range of motion.     Cervical back: Neck supple.     Right lower leg: No edema.     Left lower leg: No edema.  Lymphadenopathy:     Cervical: No cervical adenopathy.  Skin:    General: Skin is warm and dry.  Neurological:     General: No focal deficit present.     Mental Status: She is alert.     Gait: Gait abnormal.  Psychiatric:         Mood and Affect: Mood normal.        Behavior: Behavior normal.     Lab Results  Component Value Date   WBC 6.4 11/09/2020   HGB 13.1 11/09/2020   HCT 38.3 11/09/2020   PLT 264.0 11/09/2020   GLUCOSE 100 (H) 11/09/2020   CHOL 198 11/09/2020   TRIG 166.0 (H) 11/09/2020   HDL 91.80 11/09/2020   LDLDIRECT 107.0 10/14/2019   LDLCALC 73 11/09/2020   ALT 32 11/09/2020   AST 25 11/09/2020   NA 137 11/09/2020  K 4.2 11/09/2020   CL 102 11/09/2020   CREATININE 1.27 (H) 11/09/2020   BUN 23 11/09/2020   CO2 28 11/09/2020   TSH 0.15 (L) 11/09/2020   HGBA1C 5.3 04/28/2009    MM CLIP PLACEMENT RIGHT  Result Date: 06/18/2019 CLINICAL DATA:  Patient underwent biopsy of right breast calcifications. EXAM: DIAGNOSTIC RIGHT MAMMOGRAM POST STEREOTACTIC BIOPSY COMPARISON:  Previous exam(s). FINDINGS: Mammographic images were obtained following stereotactic guided biopsy of calcifications in the right breast at 12 o'clock. There is placement of a coil shaped clip at the biopsy site which is located approximately 1.0 cm inferior to the residual calcifications on the MLO view. IMPRESSION: Postprocedure mammogram demonstrates placement of a biopsy clip located approximately 1 cm inferior to the residual calcifications that were biopsied in the right breast at 12 o'clock. Final Assessment: Post Procedure Mammograms for Marker Placement Electronically Signed   By: Audie Pinto M.D.   On: 06/18/2019 09:18   MM RT BREAST BX W LOC DEV 1ST LESION IMAGE BX SPEC STEREO GUIDE  Addendum Date: 06/19/2019   ADDENDUM REPORT: 06/19/2019 13:15 ADDENDUM: Pathology revealed SCLEROTIC FIBROADENOMATOID NODULE WITH CALCIFICATIONS of the RIGHT breast, 12 o'clock. This was found to be concordant by Dr. Audie Pinto. Pathology results were discussed with the patient by telephone. The patient reported doing well after the biopsy with tenderness at the site. Post biopsy instructions and care were reviewed and questions  were answered. The patient was encouraged to call [facility] for any additional concerns. The patient was instructed to return for annual screening mammography and informed a reminder notice would be sent regarding this appointment. Pathology results reported by Stacie Acres, RN on 06/19/2019. Electronically Signed   By: Audie Pinto M.D.   On: 06/19/2019 13:15   Result Date: 06/19/2019 CLINICAL DATA:  Patient presents for biopsy of right breast calcifications. EXAM: RIGHT BREAST STEREOTACTIC CORE NEEDLE BIOPSY COMPARISON:  Previous exams. FINDINGS: The patient and I discussed the procedure of stereotactic-guided biopsy including benefits and alternatives. We discussed the high likelihood of a successful procedure. We discussed the risks of the procedure including infection, bleeding, tissue injury, clip migration, and inadequate sampling. Informed written consent was given. The usual time out protocol was performed immediately prior to the procedure. Using sterile technique and 1% Lidocaine as local anesthetic, under stereotactic guidance, a 9 gauge vacuum assisted device was used to perform core needle biopsy of calcifications in the right breast at 12 o'clock using a superior approach. Specimen radiograph was performed showing 2 of 6 specimens with calcifications. Specimens with calcifications are identified for pathology. Lesion quadrant: Upper outer quadrant At the conclusion of the procedure, coil tissue marker clip was deployed into the biopsy cavity. Follow-up 2-view mammogram was performed and dictated separately. IMPRESSION: Stereotactic-guided biopsy of right breast calcifications at 12 o'clock. No apparent complications. Electronically Signed: By: Audie Pinto M.D. On: 06/18/2019 09:14   DG Chest 2 View  Result Date: 02/08/2021 CLINICAL DATA:  Chronic cough which has been worsening for 3 months. History of asthma EXAM: CHEST - 2 VIEW COMPARISON:  07/01/2014  FINDINGS: Heart size and mediastinal contours are unremarkable. Diffuse chronic interstitial coarsening is identified bilaterally compatible with chronic smoking related changes. Linear scar like densities noted in the left lower lobe. No pleural effusion or edema. No airspace opacities. Spondylosis identified within the thoracic spine. IMPRESSION: 1. No acute cardiopulmonary abnormalities. 2. Chronic smoking related changes. Electronically Signed   By: Queen Slough.D.  On: 02/08/2021 15:00     Assessment & Plan:   Breckyn was seen today for hypertension and back pain.  Diagnoses and all orders for this visit:  Cough- Her chest x-ray is negative for mass or infiltrate. -     DG Chest 2 View; Future  Essential hypertension- Her blood pressure is adequately well controlled.  DDD (degenerative disc disease), lumbar -     Ambulatory referral to Orthopedic Surgery -     oxyCODONE (OXY IR/ROXICODONE) 5 MG immediate release tablet; Take 1 tablet (5 mg total) by mouth every 6 (six) hours as needed for severe pain.  Mild intermittent asthma without complication- I recommended that she use a triple inhaler for the asthma. -     Fluticasone-Umeclidin-Vilant (TRELEGY ELLIPTA) 100-62.5-25 MCG/INH AEPB; Inhale 1 puff into the lungs daily. -     ipratropium-albuterol (DUONEB) 0.5-2.5 (3) MG/3ML SOLN; Take 3 mLs by nebulization as needed.  Acute bilateral low back pain with bilateral sciatica -     oxyCODONE (OXY IR/ROXICODONE) 5 MG immediate release tablet; Take 1 tablet (5 mg total) by mouth every 6 (six) hours as needed for severe pain.  Radiculitis of right cervical region -     oxyCODONE (OXY IR/ROXICODONE) 5 MG immediate release tablet; Take 1 tablet (5 mg total) by mouth every 6 (six) hours as needed for severe pain.  Primary osteoarthritis of both knees -     oxyCODONE (OXY IR/ROXICODONE) 5 MG immediate release tablet; Take 1 tablet (5 mg total) by mouth every 6 (six) hours as needed for  severe pain.  Spinal stenosis, lumbar region, with neurogenic claudication -     oxyCODONE (OXY IR/ROXICODONE) 5 MG immediate release tablet; Take 1 tablet (5 mg total) by mouth every 6 (six) hours as needed for severe pain.   I have discontinued Lachele H. Hutchinson's hyoscyamine, budesonide-formoterol, co-enzyme Q-10, and lidocaine. I am also having her start on Trelegy Ellipta. Additionally, I am having her maintain her albuterol, Colchicine, levocetirizine, vitamin C, multivitamin, b complex vitamins, famotidine, omega-3 acid ethyl esters, irbesartan, Viibryd, allopurinol, torsemide, buPROPion, potassium chloride, liothyronine, pravastatin, tiZANidine, ipratropium-albuterol, and oxyCODONE.  Meds ordered this encounter  Medications  . Fluticasone-Umeclidin-Vilant (TRELEGY ELLIPTA) 100-62.5-25 MCG/INH AEPB    Sig: Inhale 1 puff into the lungs daily.    Dispense:  3 each    Refill:  1  . ipratropium-albuterol (DUONEB) 0.5-2.5 (3) MG/3ML SOLN    Sig: Take 3 mLs by nebulization as needed.    Dispense:  360 mL    Refill:  1  . oxyCODONE (OXY IR/ROXICODONE) 5 MG immediate release tablet    Sig: Take 1 tablet (5 mg total) by mouth every 6 (six) hours as needed for severe pain.    Dispense:  90 tablet    Refill:  0     Follow-up: Return in about 3 months (around 05/10/2021).  Scarlette Calico, MD

## 2021-02-07 NOTE — Patient Instructions (Signed)

## 2021-02-08 ENCOUNTER — Encounter: Payer: Self-pay | Admitting: Internal Medicine

## 2021-02-08 MED ORDER — MOMETASONE FUROATE 50 MCG/ACT NA SUSP: 2.0000 | NASAL | 1 refills | Status: AC | PRN

## 2021-02-09 ENCOUNTER — Ambulatory Visit: Payer: Medicare Other

## 2021-02-14 ENCOUNTER — Ambulatory Visit: Payer: Medicare Other

## 2021-02-16 ENCOUNTER — Ambulatory Visit: Payer: Medicare Other

## 2021-02-20 ENCOUNTER — Ambulatory Visit: Payer: Medicare Other

## 2021-02-23 ENCOUNTER — Ambulatory Visit: Payer: Medicare Other

## 2021-03-03 ENCOUNTER — Telehealth: Payer: Self-pay | Admitting: Pharmacist

## 2021-03-03 ENCOUNTER — Telehealth: Payer: Medicare Other

## 2021-03-03 NOTE — Telephone Encounter (Signed)
  Chronic Care Management   Outreach Note  03/03/2021 Name: Leslie Edwards MRN: [id number] DOB: 1946/05/12  Referred by: Janith Lima, MD  Patient had a phone appointment scheduled with clinical pharmacist today.  An unsuccessful telephone outreach was attempted today. The patient was referred to the pharmacist for assistance with medications, care management and care coordination.   Patient will NOT be penalized in any way for missing a CCM appointment. The no-show fee does not apply.  If possible, a message was left to return call to: [phone] or to [facility]: Cody, PharmD, Para March, CPP Clinical Pharmacist [facility] [phone]

## 2021-03-03 NOTE — Progress Notes (Deleted)
Chronic Care Management Pharmacy Note  03/03/2021 Name:  Leslie Edwards MRN:  [id number] DOB:  1946/02/26  Summary:   Recommendations/Changes made from today's visit:   Plan:   Subjective: Leslie Edwards is an 75 y.o. year old female who is a primary patient of Janith Lima, MD.  The CCM team was consulted for assistance with disease management and care coordination needs.    Engaged with patient by telephone for follow up visit in response to provider referral for pharmacy case management and/or care coordination services.   Consent to Services:  The patient was given information about Chronic Care Management services, agreed to services, and gave verbal consent prior to initiation of services.  Please see initial visit note for detailed documentation.   Patient Care Team: Janith Lima, MD as PCP - General (Internal Medicine) Charlton Haws, [facility] as Pharmacist (Pharmacist)  Recent office visits: 02/07/21 Dr Ronnald Ramp OV: chronic f/u; c/o worsening back pain, chronic cough. Xray negative. Referred to ortho surgery. Rx'd Trelegy, Nasonex.  11/09/20 Dr Ronnald Ramp OV: chronic f/u. Rx'd irbesartan 150 mg for BP (160/92). Referred to pain mgmt. Stop levothyroxine and cytomel due to TSH 0.15.  10/14/20 Dr Ronnald Ramp OV: f/u back pain. Rx'd methylpred and tizanidine.  Recent consult visits: PT for muscle weakness/instability. 12/01/20 Dr Adam Phenix (phys med): f/u spinal stenosis. Referred to PT.  Hospital visits: None in previous 6 months  Objective:  Lab Results  Component Value Date   CREATININE 1.27 (H) 11/09/2020   BUN 23 11/09/2020   GFR 41.28 (L) 11/09/2020   GFRNONAA 38 (L) 04/12/2020   GFRAA 44 (L) 04/12/2020   NA 137 11/09/2020   K 4.2 11/09/2020   CALCIUM 10.0 11/09/2020   CALCIUM 9.9 11/09/2020   CO2 28 11/09/2020   GLUCOSE 100 (H) 11/09/2020    Lab Results  Component Value Date/Time   HGBA1C 5.3 04/28/2009 12:03 PM   GFR 41.28 (L) 11/09/2020 10:18 AM   GFR  38.26 (L) 10/14/2019 11:55 AM    Last diabetic Eye exam:  Lab Results  Component Value Date/Time   HMDIABEYEEXA No Retinopathy 04/28/2018 12:00 AM    Last diabetic Foot exam: No results found for: HMDIABFOOTEX   Lab Results  Component Value Date   CHOL 198 11/09/2020   HDL 91.80 11/09/2020   LDLCALC 73 11/09/2020   LDLDIRECT 107.0 10/14/2019   TRIG 166.0 (H) 11/09/2020   CHOLHDL 2 11/09/2020    Hepatic Function Latest Ref Rng & Units 11/09/2020 10/30/2018 10/21/2017  Total Protein 6.0 - 8.3 g/dL 7.3 7.0 7.2  Albumin 3.5 - 5.2 g/dL 4.5 4.6 4.6  AST 0 - 37 U/L _0 ALT 0 - 35 U/L 32 17 17  Alk Phosphatase 39 - 117 U/L 76 59 55  Total Bilirubin 0.2 - 1.2 mg/dL 0.8 0.7 0.5  Bilirubin, Direct 0.0 - 0.3 mg/dL 0.2 - -    Lab Results  Component Value Date/Time   TSH 0.15 (L) 11/09/2020 10:18 AM   TSH 0.60 04/12/2020 10:26 AM   FREET4 0.86 10/31/2015 03:05 PM   FREET4 0.89 11/06/2013 11:14 AM    CBC Latest Ref Rng & Units 11/09/2020 10/14/2019 10/30/2018  WBC 4.0 - 10.5 K/uL 6.4 6.5 5.9  Hemoglobin 12.0 - 15.0 g/dL 13.1 13.3 13.2  Hematocrit 36.0 - 46.0 % 38.3 39.3 38.7  Platelets 150.0 - 400.0 K/uL 264.0 293.0 237.0    Lab Results  Component Value Date/Time   VD25OH 34.42 10/14/2019 11:55 AM  VD25OH 67.69 04/17/2017 09:58 AM    Clinical ASCVD: No  The 10-year ASCVD risk score Mikey Bussing DC Jr., et al., 2013) is: 28.6%   Values used to calculate the score:     Age: 43 years     Sex: Female     Is Non-Hispanic African American: No     Diabetic: No     Tobacco smoker: No     Systolic Blood Pressure: 130 mmHg     Is BP treated: Yes     HDL Cholesterol: 91.8 mg/dL     Total Cholesterol: 198 mg/dL    Depression screen [facility] 2/9 12/01/2020 10/14/2020 04/12/2020  Decreased Interest 3 0 0  Down, Depressed, Hopeless 2 0 1  PHQ - 2 Score 5 0 1  Altered sleeping 3 0 1  Tired, decreased energy 2 0 1  Change in appetite 3 0 0  Feeling bad or failure about yourself  0 0 0   Trouble concentrating 3 0 0  Moving slowly or fidgety/restless 1 0 0  Suicidal thoughts 0 0 0  PHQ-9 Score 17 0 3  Difficult doing work/chores - - Not difficult at all  Some recent data might be hidden    GAD 7 : Generalized Anxiety Score 04/17/2017  Nervous, Anxious, on Edge 2  Control/stop worrying 3  Worry too much - different things 3  Trouble relaxing 2  Restless 1  Easily annoyed or irritable 2  Afraid - awful might happen 3  Total GAD 7 Score 16       Social History   Tobacco Use  Smoking Status Former   Pack years: 0.00   Types: Cigarettes   Quit date: 01/22/1975   Years since quitting: 46.1  Smokeless Tobacco Never   BP Readings from Last 3 Encounters:  02/07/21 (!) 142/78  01/26/21 116/75  12/01/20 125/69   Pulse Readings from Last 3 Encounters:  02/07/21 80  01/26/21 88  12/01/20 78   Wt Readings from Last 3 Encounters:  02/07/21 227 lb (103 kg)  01/26/21 224 lb (101.6 kg)  12/01/20 225 lb (102.1 kg)   BMI Readings from Last 3 Encounters:  02/07/21 40.21 kg/m  01/26/21 39.68 kg/m  12/01/20 38.62 kg/m   Assessment/Interventions: Review of patient past medical history, allergies, medications, health status, including review of consultants reports, laboratory and other test data, was performed as part of comprehensive evaluation and provision of chronic care management services.   SDOH:  (Social Determinants of Health) assessments and interventions performed: Yes  SDOH Screenings   Alcohol Screen: Not on file  Depression (PHQ2-9): Medium Risk   PHQ-2 Score: 17  Financial Resource Strain: Not on file  Food Insecurity: Not on file  Housing: Not on file  Physical Activity: Not on file  Social Connections: Not on file  Stress: Not on file  Tobacco Use: Medium Risk   Smoking Tobacco Use: Former   Smokeless Tobacco Use: Never  Transportation Needs: Not on file    Orient  Allergies  Allergen Reactions   Penicillins     REACTION:  difficulty breathing   Prochlorperazine Edisylate     Medications Reviewed Today     Reviewed by Janith Lima, MD (Physician) on 02/07/21 at [place] List Status: <None>   Medication Order Taking? Sig Documenting Provider Last Dose Status Informant  albuterol (PROVENTIL HFA;VENTOLIN HFA) 108 (90 Base) MCG/ACT inhaler [id number] Yes Inhale 2 puffs into the lungs every 6 (six) hours as needed. Ronnald Ramp,  Arvid Right, MD Taking Active   allopurinol (ZYLOPRIM) 100 MG tablet [id number] Yes TAKE 1 TABLET ONCE DAILY. Janith Lima, MD Taking Active   Ascorbic Acid (VITAMIN C) 1000 MG tablet [id number] Yes Take 1,000 mg by mouth daily. [provider] Taking Active Self  b complex vitamins tablet [id number] Yes Take 1 tablet by mouth daily. [provider] Taking Active   budesonide-formoterol (SYMBICORT) 160-4.5 MCG/ACT inhaler [id number] Yes Inhale 2 puffs into the lungs 2 (two) times daily. Janith Lima, MD Taking Active   buPROPion (WELLBUTRIN XL) 150 MG 24 hr tablet [id number] Yes TAKE 3 TABLETS DAILY. Janith Lima, MD Taking Active   co-enzyme Q-10 30 MG capsule [id number] Yes Take 30 mg by mouth 3 (three) times daily. [provider] Taking Active Self  Colchicine (MITIGARE) 0.6 MG CAPS [id number] Yes Take 1 tablet by mouth 2 (two) times daily. Janith Lima, MD Taking Active   famotidine (PEPCID) 10 MG tablet [id number] Yes Take 10 mg by mouth 2 (two) times daily as needed for heartburn or indigestion. [provider] Taking Active Self  hyoscyamine (LEVSIN SL) 0.125 MG SL tablet [id number] Yes Place 1 tablet (0.125 mg total) under the tongue every 4 (four) hours as needed. Janith Lima, MD Taking Active   ipratropium-albuterol (DUONEB) 0.5-2.5 (3) MG/3ML SOLN [id number] Yes Take 3 mLs by nebulization as needed. Janith Lima, MD Taking Active   irbesartan (AVAPRO) 150 MG tablet [id number] Yes Take 1 tablet (150 mg total) by mouth daily. Janith Lima, MD  Taking Active     Discontinued 03/16/13 1210 (Reorder)   levocetirizine (XYZAL) 5 MG tablet [id number]  Take 2 tablets (10 mg total) by mouth every evening for 7 days. Janith Lima, MD  Expired 06/29/19 2359   lidocaine (LIDODERM) 5 % [id number] Yes Place 1 patch onto the skin daily. Remove & Discard patch within 12 hours or as directed by MD Raulkar, Clide Deutscher, MD Taking Active   liothyronine (CYTOMEL) 25 MCG tablet [id number] Yes TAKE 1 TABLET ONCE DAILY. Janith Lima, MD Taking Active   Multiple Vitamin (MULTIVITAMIN) tablet [id number] Yes Take 1 tablet by mouth daily. [provider] Taking Active   omega-3 acid ethyl esters (LOVAZA) 1 g capsule [id number] Yes TAKE (2) CAPSULES BY MOUTH TWICE DAILY. Janith Lima, MD Taking Active   oxyCODONE (OXY IR/ROXICODONE) 5 MG immediate release tablet [id number] Yes Take 1 tablet (5 mg total) by mouth every 6 (six) hours as needed for severe pain. Janith Lima, MD Taking Active            Med Note Haywood Filler Jan 26, 2021 11:25 AM) Janith Lima, MD   potassium chloride (KLOR-CON) 10 MEQ tablet [id number] Yes TAKE 1 TABLET BY MOUTH DAILY. Janith Lima, MD Taking Active   pravastatin (PRAVACHOL) 20 MG tablet [id number] Yes Take 20 mg by mouth daily. [provider] Taking Active   tiZANidine (ZANAFLEX) 4 MG tablet [id number] Yes Take 1 tablet (4 mg total) by mouth at bedtime. Izora Ribas, MD Taking Active   torsemide (DEMADEX) 20 MG tablet [id number] Yes TAKE 1 TABLET ONCE DAILY. Janith Lima, MD Taking Active   VIIBRYD 40 MG TABS [id number] Yes TAKE 1 TABLET ONCE DAILY. Janith Lima, MD Taking Active             Patient Active Problem List   Diagnosis Date Noted   Cough 02/07/2021  Encounter for general adult medical examination with abnormal findings 11/09/2020   DDD (degenerative disc disease), lumbar 10/14/2020   Hyperlipidemia with target LDL less than 100 10/15/2019   Routine general  medical examination at a health care facility 10/14/2019   Chronic gout due to renal impairment of multiple sites without tophus 04/13/2019   LVH (left ventricular hypertrophy) due to hypertensive disease, with heart failure ([place]) 94/76/5465   Diastolic dysfunction with chronic heart failure ([place]) 11/20/2018   Esophageal dysphagia 10/30/2018   Primary osteoarthritis of both knees 08/28/2016   Radiculitis of right cervical region 07/30/2016   Ocular rosacea 12/21/2015   Pure hyperglyceridemia 11/01/2015   Nonspecific abnormal electrocardiogram (ECG) (EKG) 10/31/2015   Nuclear sclerosis of both eyes 01/05/2015   Heart murmur, systolic 03/54/6568   Depression with anxiety 07/22/2014   Asthma, intermittent 06/03/2014   Visit for screening mammogram 06/03/2014   Spinal stenosis, lumbar region, with neurogenic claudication 06/03/2014   Kidney disease, chronic, stage IV (GFR 15-29 ml/min) ([place]) 06/03/2014   Parathyroid hormone excess ([place]) 08/31/2013   Celiac disease 08/18/2007   Essential hypertension 05/06/2007   Allergic rhinitis 05/06/2007   OSTEOPOROSIS 05/06/2007   Other specified hypothyroidism 04/28/2007   Morbid obesity ([place]) 04/28/2007   GERD 04/28/2007    Immunization History  Administered Date(s) Administered   Fluad Quad(high Dose 65+) 06/30/2019   Influenza Split 07/23/2011, 06/24/2012   Influenza Whole 07/18/2007, 06/14/2008, 06/13/2010   Influenza, High Dose Seasonal PF 07/24/2013, 06/12/2016, 07/02/2017, 07/03/2018   Influenza,inj,Quad PF,6+ Mos 06/03/2014, 06/30/2015   Influenza-Unspecified 07/20/2020   PFIZER(Purple Top)SARS-COV-2 Vaccination 10/07/2019, 10/28/2019, 07/02/2020   Pneumococcal Conjugate-13 07/24/2013   Pneumococcal Polysaccharide-23 06/30/2015, 11/09/2020   Td 12/17/1998   Tdap 06/24/2012   Zoster Recombinat (Shingrix) 03/18/2018, 08/07/2018   Zoster, Live 05/01/2011    Conditions to be addressed/monitored:  Hypertension, Hyperlipidemia, Heart  Failure, Asthma, Chronic Kidney Disease, Hypothyroidism, Depression, Anxiety, Osteopenia and Gout  Patient Care Plan: [facility]CSM Pharmacy Care Plan     Problem Identified: Hypertension, Hyperlipidemia, Heart Failure, Asthma, Chronic Kidney Disease, Hypothyroidism, Depression, Anxiety, Osteopenia and Gout   Priority: High     Long-Range Goal: Disease management   Start Date: 01/10/2021  Expected End Date: 07/12/2021  This Visit's Progress: On track  Priority: High  Note:   Current Barriers:  Unable to independently monitor therapeutic efficacy Suboptimal therapeutic regimen for hyperlipidemia  Pharmacist Clinical Goal(s):  Patient will achieve adherence to monitoring guidelines and medication adherence to achieve therapeutic efficacy adhere to plan to optimize therapeutic regimen for hyperlipidemia as evidenced by report of adherence to recommended medication management changes through collaboration with PharmD and provider.   Interventions: 1:1 collaboration with Janith Lima, MD regarding development and update of comprehensive plan of care as evidenced by provider attestation and co-signature Inter-disciplinary care team collaboration (see longitudinal plan of care) Comprehensive medication review performed; medication list updated in electronic medical record  Hyperlipidemia: (LDL goal < 100) -Not ideally controlled - pt reports aching/cramps since starting rosuvastatin, CoQ10 has not helped. LDL improved 107 > 73 and Trig 280 > 166 since being on rosuvastatin. She heard about Praluent injections and is interested in trying it instead of rosuvastatin. -Current treatment: Rosuvastatin 10 mg daily (started 09/2019) Lovaza 1 g - 2 cap BID Coenzyme Q10 30 mg TID -Medications previously tried: none  -Assessed HLD options - the preferred PCSK9 inhibitor with her insurance is Repatha. She has to fail 2 statins for it to be covered. She has only tried rosuvastatin 10 mg  so far. -Plan:  switch rosuvastatin to pravastatin 20 mg and re-check lipids next month at PCP visit.  -If pt still has myalgias or lipids are above goal, insurance will cover Repatha  Heart Failure / HTN / CKD 3b (Goal: BP < 130/80 and prevent exacerbations) -Controlled -Last ejection fraction: 60-65% (Date: 11/2018) -HF type: Diastolic -NYHA Class: not specified -Current treatment: Irbesartan 150 mg daily Torsemide 20 mg daily Klor con 10 meq daily -Medications previously tried: furosemide, losartan, metolazone, telmisartan -Educated on Benefits of medications for managing symptoms and prolonging life -Recommended to continue current medication  Asthma (Goal: control symptoms and prevent exacerbations) -Controlled -Current treatment  Symbicort 160-4.5 mcg/act 2 puffs BID Duoneb PRN Albuterol HFA prn -Pulmonary function testing: not on file -Exacerbations requiring treatment in last 6 months: none -Patient reports consistent use of maintenance inhaler -Frequency of rescue inhaler use: infrequent -Counseled on Proper inhaler technique; Benefits of consistent maintenance inhaler use -Recommended to continue current medication  Osteopenia (Goal prevent fractures) -Not ideally controlled - pt is not taking Calcium/vitamin D -Last DEXA Scan: 10/2015  T-Score femoral neck: -2.1  T-Score lumbar spine: -0.1  10-year probability of major osteoporotic fracture: 11.7%  10-year probability of hip fracture: 2.4% -Patient is not a candidate for pharmacologic treatment -Recommend [phone] units of vitamin D daily. Recommend 1200 mg of calcium daily from dietary and supplemental sources.  Hypothyroidism (Goal: maintain TSH in goal range) -Uncontrolled - most recent TSH is low and pt was instructed to stop Synthroid 25 mcg and liothyronine 5 mcg -Current treatment  Synthroid 25 mcg daily Liothyronine 25 mcg daily Liothyronine 5 mcg daily -Advised to stop Synthroid and liothyronine 5 mcg as previously  instructed by PCP. F/U 1 month with PCP.  Depression/Anxiety (Goal: manage symptoms) -Controlled -Current treatment: Bupropion XL 150 mg - 3 tablets daily Viibryd 40 mg daily -PHQ9: 17 (11/2020) -GAD7: 16 (04/2017) -Connected with PCP for mental health support -Educated on Benefits of medication for symptom control -Recommended to continue current medication  Gout (Goal: prevent flares) -Controlled -Current treatment  Allopurinol 100 mg daily Colchicine 0.6 mg BID -Recommended to continue current medication  Patient Goals/Self-Care Activities Patient will:  - take medications as prescribed focus on medication adherence by pill box check blood pressure weekly, document, and provide at future appointments  Follow Up Plan: Telephone follow up appointment with care management team member scheduled for: 2 months    Medication Assistance: None required.  Patient affirms current coverage meets needs.  Compliance/Adherence/Medication fill history: Care Gaps: Covid booster (due 11/02/20)  Star-Rating Drugs: Irbesartan - LF 11/09/20 x 90 ds Pravastatin - LF 01/11/21 x 90 ds  Patient's preferred pharmacy is:  [facility], [place] [address] Phone: [phone] Fax: [phone]  [facility] - [place], [place] - [address] [place] [address] Phone: [phone] Fax: [phone]  [facility] - [place], [place] - [address] [place] [address] Phone: [phone] Fax: [phone]  Uses pill box? Yes Pt endorses 100% compliance  We discussed: Current pharmacy is preferred with insurance plan and patient is satisfied with pharmacy services Patient decided to: Continue current medication management strategy  Care Plan and Follow Up Patient Decision:  Patient agrees to Care Plan and Follow-up.  Plan:  Telephone follow up appointment with care management team member scheduled for:  2 months  Charlene Brooke, PharmD, [place], CPP Clinical Pharmacist [facility] [phone]

## 2021-03-07 DIAGNOSIS — M5432 Sciatica, left side: Secondary | ICD-10-CM | POA: Diagnosis not present

## 2021-03-07 DIAGNOSIS — M545 Low back pain, unspecified: Secondary | ICD-10-CM | POA: Diagnosis not present

## 2021-03-07 DIAGNOSIS — M4156 Other secondary scoliosis, lumbar region: Secondary | ICD-10-CM | POA: Diagnosis not present

## 2021-03-07 DIAGNOSIS — M4726 Other spondylosis with radiculopathy, lumbar region: Secondary | ICD-10-CM | POA: Diagnosis not present

## 2021-03-27 ENCOUNTER — Other Ambulatory Visit: Payer: Self-pay | Admitting: Orthopedic Surgery

## 2021-03-27 DIAGNOSIS — M4156 Other secondary scoliosis, lumbar region: Secondary | ICD-10-CM

## 2021-03-31 ENCOUNTER — Other Ambulatory Visit: Payer: Self-pay | Admitting: Physical Medicine and Rehabilitation

## 2021-03-31 DIAGNOSIS — M5442 Lumbago with sciatica, left side: Secondary | ICD-10-CM

## 2021-03-31 DIAGNOSIS — M5136 Other intervertebral disc degeneration, lumbar region: Secondary | ICD-10-CM

## 2021-03-31 DIAGNOSIS — M48062 Spinal stenosis, lumbar region with neurogenic claudication: Secondary | ICD-10-CM

## 2021-03-31 DIAGNOSIS — M5412 Radiculopathy, cervical region: Secondary | ICD-10-CM

## 2021-03-31 DIAGNOSIS — M5441 Lumbago with sciatica, right side: Secondary | ICD-10-CM

## 2021-04-01 ENCOUNTER — Other Ambulatory Visit: Payer: Self-pay | Admitting: Internal Medicine

## 2021-04-01 DIAGNOSIS — M1A39X0 Chronic gout due to renal impairment, multiple sites, without tophus (tophi): Secondary | ICD-10-CM

## 2021-04-01 DIAGNOSIS — E038 Other specified hypothyroidism: Secondary | ICD-10-CM

## 2021-04-01 DIAGNOSIS — I10 Essential (primary) hypertension: Secondary | ICD-10-CM

## 2021-04-01 DIAGNOSIS — E785 Hyperlipidemia, unspecified: Secondary | ICD-10-CM

## 2021-04-01 DIAGNOSIS — E781 Pure hyperglyceridemia: Secondary | ICD-10-CM

## 2021-04-01 MED ORDER — IRBESARTAN 150 MG PO TABS: 150.0000 mg | ORAL_TABLET | Freq: Every day | ORAL | 1 refills | Status: DC

## 2021-04-01 MED ORDER — TORSEMIDE 20 MG PO TABS: 20.0000 mg | ORAL_TABLET | Freq: Every day | ORAL | 1 refills | Status: DC

## 2021-04-01 MED ORDER — OMEGA-3-ACID ETHYL ESTERS 1 G PO CAPS: 2.0000 g | ORAL_CAPSULE | Freq: Two times a day (BID) | ORAL | 1 refills | Status: DC

## 2021-04-01 MED ORDER — PRAVASTATIN SODIUM 20 MG PO TABS: 20.0000 mg | ORAL_TABLET | Freq: Every day | ORAL | 1 refills | Status: DC

## 2021-04-01 MED ORDER — POTASSIUM CHLORIDE ER 10 MEQ PO TBCR
10.0000 meq | EXTENDED_RELEASE_TABLET | Freq: Every day | ORAL | 1 refills | Status: DC
Start: 2021-04-01 — End: 2021-09-28

## 2021-04-01 MED ORDER — LIOTHYRONINE SODIUM 25 MCG PO TABS
25.0000 ug | ORAL_TABLET | Freq: Every day | ORAL | 1 refills | Status: DC
Start: 2021-04-01 — End: 2021-09-28

## 2021-04-01 MED ORDER — ALLOPURINOL 100 MG PO TABS: 100.0000 mg | ORAL_TABLET | Freq: Every day | ORAL | 1 refills | Status: DC

## 2021-04-02 ENCOUNTER — Ambulatory Visit
Admission: RE | Admit: 2021-04-02 | Discharge: 2021-04-02 | Disposition: A | Discharge status: Home / Self Care | Payer: Medicare Other | Source: Ambulatory Visit | Attending: Orthopedic Surgery | Admitting: Orthopedic Surgery

## 2021-04-02 DIAGNOSIS — M545 Low back pain, unspecified: Secondary | ICD-10-CM | POA: Diagnosis not present

## 2021-04-02 DIAGNOSIS — M48061 Spinal stenosis, lumbar region without neurogenic claudication: Secondary | ICD-10-CM | POA: Diagnosis not present

## 2021-04-02 DIAGNOSIS — M4156 Other secondary scoliosis, lumbar region: Secondary | ICD-10-CM

## 2021-04-04 ENCOUNTER — Other Ambulatory Visit: Payer: Self-pay | Admitting: Internal Medicine

## 2021-04-04 ENCOUNTER — Encounter: Payer: Self-pay | Admitting: Internal Medicine

## 2021-04-04 DIAGNOSIS — M48062 Spinal stenosis, lumbar region with neurogenic claudication: Secondary | ICD-10-CM

## 2021-04-04 DIAGNOSIS — M5432 Sciatica, left side: Secondary | ICD-10-CM | POA: Diagnosis not present

## 2021-04-04 DIAGNOSIS — M5442 Lumbago with sciatica, left side: Secondary | ICD-10-CM

## 2021-04-04 DIAGNOSIS — M4726 Other spondylosis with radiculopathy, lumbar region: Secondary | ICD-10-CM | POA: Diagnosis not present

## 2021-04-04 DIAGNOSIS — M4156 Other secondary scoliosis, lumbar region: Secondary | ICD-10-CM | POA: Diagnosis not present

## 2021-04-04 DIAGNOSIS — M170 Bilateral primary osteoarthritis of knee: Secondary | ICD-10-CM

## 2021-04-04 DIAGNOSIS — M5441 Lumbago with sciatica, right side: Secondary | ICD-10-CM

## 2021-04-04 DIAGNOSIS — M5412 Radiculopathy, cervical region: Secondary | ICD-10-CM

## 2021-04-04 DIAGNOSIS — M5136 Other intervertebral disc degeneration, lumbar region: Secondary | ICD-10-CM

## 2021-04-04 MED ORDER — OXYCODONE HCL 5 MG PO TABS: 5.0000 mg | ORAL_TABLET | Freq: Four times a day (QID) | ORAL | 0 refills | Status: DC | PRN

## 2021-04-13 DIAGNOSIS — M47816 Spondylosis without myelopathy or radiculopathy, lumbar region: Secondary | ICD-10-CM | POA: Diagnosis not present

## 2021-04-13 DIAGNOSIS — M5416 Radiculopathy, lumbar region: Secondary | ICD-10-CM | POA: Diagnosis not present

## 2021-04-13 DIAGNOSIS — M4156 Other secondary scoliosis, lumbar region: Secondary | ICD-10-CM | POA: Diagnosis not present

## 2021-04-25 DIAGNOSIS — M5416 Radiculopathy, lumbar region: Secondary | ICD-10-CM | POA: Diagnosis not present

## 2021-04-28 ENCOUNTER — Ambulatory Visit: Payer: Medicare Other | Admitting: Physical Medicine and Rehabilitation

## 2021-05-09 DIAGNOSIS — M4156 Other secondary scoliosis, lumbar region: Secondary | ICD-10-CM | POA: Diagnosis not present

## 2021-05-09 DIAGNOSIS — M47816 Spondylosis without myelopathy or radiculopathy, lumbar region: Secondary | ICD-10-CM | POA: Diagnosis not present

## 2021-05-09 DIAGNOSIS — M5416 Radiculopathy, lumbar region: Secondary | ICD-10-CM | POA: Diagnosis not present

## 2021-05-10 ENCOUNTER — Encounter: Payer: Self-pay | Admitting: Internal Medicine

## 2021-05-10 ENCOUNTER — Ambulatory Visit (INDEPENDENT_AMBULATORY_CARE_PROVIDER_SITE_OTHER): Payer: Medicare Other | Admitting: Internal Medicine

## 2021-05-10 ENCOUNTER — Other Ambulatory Visit: Payer: Self-pay

## 2021-05-10 VITALS — BP 148/76 | HR 68 | Temp 98.3°F | Resp 16 | Ht 63.0 in | Wt 234.2 lb

## 2021-05-10 DIAGNOSIS — M5412 Radiculopathy, cervical region: Secondary | ICD-10-CM

## 2021-05-10 DIAGNOSIS — M170 Bilateral primary osteoarthritis of knee: Secondary | ICD-10-CM

## 2021-05-10 DIAGNOSIS — N184 Chronic kidney disease, stage 4 (severe): Secondary | ICD-10-CM

## 2021-05-10 DIAGNOSIS — E038 Other specified hypothyroidism: Secondary | ICD-10-CM | POA: Diagnosis not present

## 2021-05-10 DIAGNOSIS — I10 Essential (primary) hypertension: Secondary | ICD-10-CM | POA: Diagnosis not present

## 2021-05-10 DIAGNOSIS — M5441 Lumbago with sciatica, right side: Secondary | ICD-10-CM

## 2021-05-10 DIAGNOSIS — M5136 Other intervertebral disc degeneration, lumbar region: Secondary | ICD-10-CM | POA: Diagnosis not present

## 2021-05-10 DIAGNOSIS — M5442 Lumbago with sciatica, left side: Secondary | ICD-10-CM

## 2021-05-10 DIAGNOSIS — M48062 Spinal stenosis, lumbar region with neurogenic claudication: Secondary | ICD-10-CM

## 2021-05-10 LAB — CBC WITH DIFFERENTIAL/PLATELET
Basophils Absolute: 0.1 10*3/uL (ref 0.0–0.1)
Basophils Relative: 0.8 % (ref 0.0–3.0)
Eosinophils Absolute: 0.3 10*3/uL (ref 0.0–0.7)
Eosinophils Relative: 4.6 % (ref 0.0–5.0)
HCT: 36.4 % (ref 36.0–46.0)
Hemoglobin: 12.2 g/dL (ref 12.0–15.0)
Lymphocytes Relative: 27.3 % (ref 12.0–46.0)
Lymphs Abs: 1.9 10*3/uL (ref 0.7–4.0)
MCHC: 33.5 g/dL (ref 30.0–36.0)
MCV: 101.2 fl — ABNORMAL HIGH (ref 78.0–100.0)
Monocytes Absolute: 0.8 10*3/uL (ref 0.1–1.0)
Monocytes Relative: 11.1 % (ref 3.0–12.0)
Neutro Abs: 3.8 10*3/uL (ref 1.4–7.7)
Neutrophils Relative %: 56.2 % (ref 43.0–77.0)
Platelets: 231 10*3/uL (ref 150.0–400.0)
RBC: 3.6 Mil/uL — ABNORMAL LOW (ref 3.87–5.11)
RDW: 13.4 % (ref 11.5–15.5)
WBC: 6.8 10*3/uL (ref 4.0–10.5)

## 2021-05-10 LAB — BASIC METABOLIC PANEL
BUN: 23 mg/dL (ref 6–23)
CO2: 25 mEq/L (ref 19–32)
Calcium: 9.7 mg/dL (ref 8.4–10.5)
Chloride: 104 mEq/L (ref 96–112)
Creatinine, Ser: 1.2 mg/dL (ref 0.40–1.20)
GFR: 44.03 mL/min — ABNORMAL LOW (ref >60.00)
Glucose, Bld: 90 mg/dL (ref 70–99)
Potassium: 4.6 mEq/L (ref 3.5–5.1)
Sodium: 137 mEq/L (ref 135–145)

## 2021-05-10 MED ORDER — OXYCODONE HCL 5 MG PO TABS: 5.0000 mg | ORAL_TABLET | Freq: Four times a day (QID) | ORAL | 0 refills | Status: DC | PRN

## 2021-05-10 NOTE — Progress Notes (Signed)
Subjective:  Patient ID: Leslie Edwards, female    DOB: Jan 31, 1946  Age: 75 y.o. MRN: [id number]  CC: Hypertension and Hypothyroidism  This visit occurred during the SARS-CoV-2 public health emergency.  Safety protocols were in place, including screening questions prior to the visit, additional usage of staff PPE, and extensive cleaning of exam room while observing appropriate contact time as indicated for disinfecting solutions.    HPI [name] presents for f/up -  She recently started taking gabapentin and this caused weight gain and lower extremity edema.  She is transitioning to Lyrica for the treatment of low back pain.  She denies chest pain, shortness of breath, diaphoresis, or palpitations.  Outpatient Medications Prior to Visit  Medication Sig Dispense Refill   albuterol (PROVENTIL HFA;VENTOLIN HFA) 108 (90 Base) MCG/ACT inhaler Inhale 2 puffs into the lungs every 6 (six) hours as needed. 18 g 11   allopurinol (ZYLOPRIM) 100 MG tablet Take 1 tablet (100 mg total) by mouth daily. 90 tablet 1   Ascorbic Acid (VITAMIN C) 1000 MG tablet Take 1,000 mg by mouth daily.     b complex vitamins tablet Take 1 tablet by mouth daily.     buPROPion (WELLBUTRIN XL) 150 MG 24 hr tablet TAKE 3 TABLETS DAILY. 270 tablet 1   famotidine (PEPCID) 10 MG tablet Take 10 mg by mouth 2 (two) times daily as needed for heartburn or indigestion.     Fluticasone-Umeclidin-Vilant (TRELEGY ELLIPTA) 100-62.5-25 MCG/INH AEPB Inhale 1 puff into the lungs daily. 3 each 1   ipratropium-albuterol (DUONEB) 0.5-2.5 (3) MG/3ML SOLN Take 3 mLs by nebulization as needed. 360 mL 1   irbesartan (AVAPRO) 150 MG tablet Take 1 tablet (150 mg total) by mouth daily. 90 tablet 1   liothyronine (CYTOMEL) 25 MCG tablet Take 1 tablet (25 mcg total) by mouth daily. 90 tablet 1   mometasone (NASONEX) 50 MCG/ACT nasal spray Place 2 sprays into the nose as needed. 3 each 1   Multiple Vitamin (MULTIVITAMIN) tablet Take 1 tablet by  mouth daily.     omega-3 acid ethyl esters (LOVAZA) 1 g capsule Take 2 capsules (2 g total) by mouth 2 (two) times daily. 360 capsule 1   potassium chloride (KLOR-CON) 10 MEQ tablet Take 1 tablet (10 mEq total) by mouth daily. 90 tablet 1   pravastatin (PRAVACHOL) 20 MG tablet Take 1 tablet (20 mg total) by mouth daily. 90 tablet 1   tiZANidine (ZANAFLEX) 2 MG tablet Take 1 tablet (2 mg total) by mouth at bedtime. 30 tablet 1   tiZANidine (ZANAFLEX) 4 MG tablet Take 1 tablet (4 mg total) by mouth at bedtime. 30 tablet 3   torsemide (DEMADEX) 20 MG tablet Take 1 tablet (20 mg total) by mouth daily. 90 tablet 1   VIIBRYD 40 MG TABS TAKE 1 TABLET ONCE DAILY. 30 tablet 5   oxyCODONE (OXY IR/ROXICODONE) 5 MG immediate release tablet Take 1 tablet (5 mg total) by mouth every 6 (six) hours as needed for severe pain. 90 tablet 0   levocetirizine (XYZAL) 5 MG tablet Take 2 tablets (10 mg total) by mouth every evening for 7 days. 14 tablet 0   pregabalin (LYRICA) 75 MG capsule Take by mouth. (Patient not taking: Reported on 05/10/2021)     No facility-administered medications prior to visit.    ROS Review of Systems  Constitutional:  Positive for unexpected weight change. Negative for chills, diaphoresis, fatigue and fever.  HENT: Negative.    Eyes:  Negative.   Respiratory:  Negative for cough, chest tightness, shortness of breath and wheezing.   Cardiovascular:  Positive for leg swelling. Negative for chest pain and palpitations.  Gastrointestinal:  Negative for abdominal pain, constipation, diarrhea and vomiting.  Endocrine: Negative.   Genitourinary: Negative.  Negative for difficulty urinating and dysuria.  Musculoskeletal:  Positive for arthralgias and back pain.  Skin: Negative.   Neurological:  Negative for dizziness, weakness and light-headedness.  Hematological:  Negative for adenopathy. Does not bruise/bleed easily.  Psychiatric/Behavioral: Negative.     Objective:  BP (!) 148/76 (BP  Location: Left Arm, Patient Position: Sitting, Cuff Size: Large)   Pulse 68   Temp 98.3 F (36.8 C) (Oral)   Resp 16   Ht 5\' 3"  (1.6 m)   Wt 234 lb 3.2 oz (106.2 kg)   SpO2 98%   BMI 41.49 kg/m   BP Readings from Last 3 Encounters:  05/10/21 (!) 148/76  02/07/21 (!) 142/78  01/26/21 116/75    Wt Readings from Last 3 Encounters:  05/10/21 234 lb 3.2 oz (106.2 kg)  02/07/21 227 lb (103 kg)  01/26/21 224 lb (101.6 kg)    Physical Exam Vitals reviewed.  HENT:     Nose: Nose normal.     Mouth/Throat:     Mouth: Mucous membranes are moist.  Eyes:     Conjunctiva/sclera: Conjunctivae normal.  Cardiovascular:     Rate and Rhythm: Normal rate and regular rhythm.     Heart sounds: No murmur heard.    Comments: Tr BLE nonpitting  Pulmonary:     Effort: Pulmonary effort is normal.     Breath sounds: No stridor. No wheezing, rhonchi or rales.  Abdominal:     General: Abdomen is protuberant. Bowel sounds are normal. There is no distension.     Palpations: Abdomen is soft. There is no hepatomegaly, splenomegaly or mass.     Tenderness: There is no abdominal tenderness. There is no guarding.  Musculoskeletal:        General: Normal range of motion.     Cervical back: Neck supple.     Right lower leg: Edema present.     Left lower leg: Edema present.  Lymphadenopathy:     Cervical: No cervical adenopathy.  Skin:    General: Skin is warm and dry.  Neurological:     General: No focal deficit present.     Mental Status: She is alert.  Psychiatric:        Mood and Affect: Mood normal.        Behavior: Behavior normal.    Lab Results  Component Value Date   WBC 6.8 05/10/2021   HGB 12.2 05/10/2021   HCT 36.4 05/10/2021   PLT 231.0 05/10/2021   GLUCOSE 90 05/10/2021   CHOL 198 11/09/2020   TRIG 166.0 (H) 11/09/2020   HDL 91.80 11/09/2020   LDLDIRECT 107.0 10/14/2019   LDLCALC 73 11/09/2020   ALT 32 11/09/2020   AST 25 11/09/2020   NA 137 05/10/2021   K 4.6  05/10/2021   CL 104 05/10/2021   CREATININE 1.20 05/10/2021   BUN 23 05/10/2021   CO2 25 05/10/2021   TSH 3.95 05/10/2021   HGBA1C 5.3 04/28/2009    MR LUMBAR SPINE WO CONTRAST  Result Date: 04/03/2021 CLINICAL DATA:  Low back pain radiating to the left leg primarily. Occasional symptoms on the right. Numbness of the left leg. EXAM: MRI LUMBAR SPINE WITHOUT CONTRAST TECHNIQUE: Multiplanar, multisequence MR imaging of  the lumbar spine was performed. No intravenous contrast was administered. COMPARISON:  Radiography 10/14/2020.  MRI 06/14/2014. FINDINGS: Segmentation:  5 lumbar type vertebral bodies. Alignment: Chronic scoliotic curvature convex to the left. Chronic degenerative anterolisthesis at L4-5, measuring 7 mm today, increased 2 mm since 2015. Vertebrae: No fracture or focal bone lesion. Discogenic endplate marrow changes at T11-12 which are newly seen and could relate to regional pain. Conus medullaris and cauda equina: Conus extends to the L1 level. Conus and cauda equina appear normal. Paraspinal and other soft tissues: Negative Disc levels: T11-12: Disc degeneration with mild bulging of the disc. No compressive stenosis. Facet osteoarthritis. Discogenic endplate marrow changes as noted above, new since 2015, which could relate to regional pain. T12-L1: Normal interspace. L1-2: 2 mm retrolisthesis. Bulging of the disc with slight caudal down turning. Mild narrowing of the lateral recesses but no likely neural compression. Similar appearance the study of 2015. L2-3: Disc degeneration with endplate osteophytes and bulging of the disc. Mild facet and ligamentous hypertrophy. Mild narrowing of the lateral recesses and foramina. Similar appearance to the study of 2015. No definite neural compression. L3-4: Endplate osteophytes and bulging of the disc, with a small left foraminal disc herniation. Facet and ligamentous hypertrophy. Mild narrowing of the lateral recesses. Small left foraminal disc  herniation likely to affect the left L3 nerve. The small left foraminal disc is new since 2015. L4-5: Chronic facet arthropathy, now with 7 mm of anterolisthesis, increased 2 mm since 2015. Endplate osteophytes and bulging of the disc. Moderate multifactorial stenosis at this level, left worse than right, that could cause neural compression on either or both sides. This includes left foraminal stenosis that could affect the exiting L4 nerve. The facet joints are gaping and fluid-filled, in this appearance could worsen with standing or flexion. L5-S1: Minimal bulging of the disc. Facet arthropathy with gaping, fluid-filled joints. No compressive stenosis of the canal. Left foraminal narrowing because of encroachment by osteophyte and bulging disc material could affect the left L5 nerve. This appearance could worsen with standing or flexion. The facet arthropathy and the left foraminal stenosis have worsened since 2015. IMPRESSION: T11-12: Degenerative disc disease with discogenic edema the endplates, newly seen since 2015. This could relate to regional pain. L3-4: Newly seen small foraminal disc herniation on the left at L3-4 could focally affect the left L3 nerve. L4-5: Facet arthropathy with worsened anterolisthesis, now measuring 7 mm, increased 2 mm since 2015. Multifactorial stenosis at this level that could cause neural compression on either or both sides. Foraminal stenosis on the left could affect the exiting L4 nerve. The facet joints are gaping and fluid-filled in the appearance could worsen with standing or flexion. L5-S1: Worsening of facet arthropathy with gaping, fluid-filled joints. Foraminal stenosis on the left that could affect the exiting left L5 nerve. The appearance could worsen with standing or flexion. This appearance has worsened since 2015. Electronically Signed   By: Nelson Chimes M.D.   On: 04/03/2021 11:18    Assessment & Plan:   Joe was seen today for hypertension and  hypothyroidism.  Diagnoses and all orders for this visit:  Essential hypertension- Her blood pressure is adequately well controlled. -     CBC with Differential/Platelet; Future -     Basic metabolic panel; Future -     Basic metabolic panel -     CBC with Differential/Platelet  Other specified hypothyroidism- She is euthyroid.  Will continue the current thyroid supplement. -  Thyroid Panel With TSH; Future -     Thyroid Panel With TSH  Kidney disease, chronic, stage IV (GFR 15-29 ml/min) (HCC)- Her renal function is stable.  She will avoid nephrotoxic agents. -     CBC with Differential/Platelet; Future -     Basic metabolic panel; Future -     Basic metabolic panel -     CBC with Differential/Platelet  DDD (degenerative disc disease), lumbar -     oxyCODONE (OXY IR/ROXICODONE) 5 MG immediate release tablet; Take 1 tablet (5 mg total) by mouth every 6 (six) hours as needed for severe pain.  Acute bilateral low back pain with bilateral sciatica  Radiculitis of right cervical region -     oxyCODONE (OXY IR/ROXICODONE) 5 MG immediate release tablet; Take 1 tablet (5 mg total) by mouth every 6 (six) hours as needed for severe pain.  Primary osteoarthritis of both knees -     oxyCODONE (OXY IR/ROXICODONE) 5 MG immediate release tablet; Take 1 tablet (5 mg total) by mouth every 6 (six) hours as needed for severe pain.  Spinal stenosis, lumbar region, with neurogenic claudication -     oxyCODONE (OXY IR/ROXICODONE) 5 MG immediate release tablet; Take 1 tablet (5 mg total) by mouth every 6 (six) hours as needed for severe pain.  I am having Katerra H. Medlin maintain her albuterol, levocetirizine, vitamin C, multivitamin, b complex vitamins, famotidine, Viibryd, buPROPion, tiZANidine, Trelegy Ellipta, ipratropium-albuterol, mometasone, tiZANidine, allopurinol, irbesartan, liothyronine, omega-3 acid ethyl esters, potassium chloride, pravastatin, torsemide, pregabalin, and  oxyCODONE.  Meds ordered this encounter  Medications   oxyCODONE (OXY IR/ROXICODONE) 5 MG immediate release tablet    Sig: Take 1 tablet (5 mg total) by mouth every 6 (six) hours as needed for severe pain.    Dispense:  90 tablet    Refill:  0     Follow-up: Return in about 3 months (around 08/10/2021).  Scarlette Calico, MD

## 2021-05-10 NOTE — Patient Instructions (Signed)

## 2021-05-11 LAB — THYROID PANEL WITH TSH
Free Thyroxine Index: 1.4 (ref 1.4–3.8)
T3 Uptake: 31 % (ref 22–35)
T4, Total: 4.5 ug/dL — ABNORMAL LOW (ref 5.1–11.9)
TSH: 3.95 mIU/L (ref 0.40–4.50)

## 2021-05-23 DIAGNOSIS — M47816 Spondylosis without myelopathy or radiculopathy, lumbar region: Secondary | ICD-10-CM | POA: Diagnosis not present

## 2021-06-07 DIAGNOSIS — M47816 Spondylosis without myelopathy or radiculopathy, lumbar region: Secondary | ICD-10-CM | POA: Diagnosis not present

## 2021-06-13 ENCOUNTER — Encounter: Payer: Self-pay | Admitting: Internal Medicine

## 2021-06-13 ENCOUNTER — Other Ambulatory Visit: Payer: Self-pay | Admitting: Internal Medicine

## 2021-06-13 DIAGNOSIS — M48062 Spinal stenosis, lumbar region with neurogenic claudication: Secondary | ICD-10-CM

## 2021-06-13 DIAGNOSIS — M5136 Other intervertebral disc degeneration, lumbar region: Secondary | ICD-10-CM

## 2021-06-13 DIAGNOSIS — M170 Bilateral primary osteoarthritis of knee: Secondary | ICD-10-CM

## 2021-06-13 DIAGNOSIS — M5412 Radiculopathy, cervical region: Secondary | ICD-10-CM

## 2021-06-13 DIAGNOSIS — F418 Other specified anxiety disorders: Secondary | ICD-10-CM

## 2021-06-13 MED ORDER — OXYCODONE HCL 5 MG PO TABS: 5.0000 mg | ORAL_TABLET | Freq: Four times a day (QID) | ORAL | 0 refills | Status: DC | PRN

## 2021-06-30 ENCOUNTER — Other Ambulatory Visit: Payer: Self-pay | Admitting: Internal Medicine

## 2021-06-30 DIAGNOSIS — E785 Hyperlipidemia, unspecified: Secondary | ICD-10-CM

## 2021-07-04 ENCOUNTER — Other Ambulatory Visit: Payer: Self-pay | Admitting: Internal Medicine

## 2021-07-04 DIAGNOSIS — E785 Hyperlipidemia, unspecified: Secondary | ICD-10-CM

## 2021-07-10 DIAGNOSIS — M47816 Spondylosis without myelopathy or radiculopathy, lumbar region: Secondary | ICD-10-CM | POA: Diagnosis not present

## 2021-07-14 ENCOUNTER — Encounter: Payer: Self-pay | Admitting: Internal Medicine

## 2021-07-14 ENCOUNTER — Other Ambulatory Visit: Payer: Self-pay | Admitting: Internal Medicine

## 2021-07-14 DIAGNOSIS — M51369 Other intervertebral disc degeneration, lumbar region without mention of lumbar back pain or lower extremity pain: Secondary | ICD-10-CM

## 2021-07-14 DIAGNOSIS — M48062 Spinal stenosis, lumbar region with neurogenic claudication: Secondary | ICD-10-CM

## 2021-07-14 DIAGNOSIS — M170 Bilateral primary osteoarthritis of knee: Secondary | ICD-10-CM

## 2021-07-14 DIAGNOSIS — M5412 Radiculopathy, cervical region: Secondary | ICD-10-CM

## 2021-07-14 DIAGNOSIS — M5136 Other intervertebral disc degeneration, lumbar region: Secondary | ICD-10-CM

## 2021-07-14 MED ORDER — OXYCODONE HCL 5 MG PO TABS: 5.0000 mg | ORAL_TABLET | Freq: Four times a day (QID) | ORAL | 0 refills | Status: DC | PRN

## 2021-07-18 ENCOUNTER — Ambulatory Visit: Payer: Medicare Other

## 2021-07-24 DIAGNOSIS — M47816 Spondylosis without myelopathy or radiculopathy, lumbar region: Secondary | ICD-10-CM | POA: Diagnosis not present

## 2021-08-16 ENCOUNTER — Encounter: Payer: Self-pay | Admitting: Internal Medicine

## 2021-08-16 ENCOUNTER — Other Ambulatory Visit: Payer: Self-pay

## 2021-08-16 ENCOUNTER — Ambulatory Visit (INDEPENDENT_AMBULATORY_CARE_PROVIDER_SITE_OTHER): Payer: Medicare Other | Admitting: Internal Medicine

## 2021-08-16 VITALS — BP 138/84 | HR 79 | Temp 98.4°F | Resp 16 | Ht 63.0 in | Wt 243.0 lb

## 2021-08-16 DIAGNOSIS — N184 Chronic kidney disease, stage 4 (severe): Secondary | ICD-10-CM | POA: Diagnosis not present

## 2021-08-16 DIAGNOSIS — E213 Hyperparathyroidism, unspecified: Secondary | ICD-10-CM

## 2021-08-16 DIAGNOSIS — Z0001 Encounter for general adult medical examination with abnormal findings: Secondary | ICD-10-CM

## 2021-08-16 DIAGNOSIS — M48062 Spinal stenosis, lumbar region with neurogenic claudication: Secondary | ICD-10-CM

## 2021-08-16 DIAGNOSIS — E038 Other specified hypothyroidism: Secondary | ICD-10-CM

## 2021-08-16 DIAGNOSIS — I10 Essential (primary) hypertension: Secondary | ICD-10-CM

## 2021-08-16 DIAGNOSIS — Z79891 Long term (current) use of opiate analgesic: Secondary | ICD-10-CM

## 2021-08-16 LAB — BASIC METABOLIC PANEL
BUN: 30 mg/dL — ABNORMAL HIGH (ref 6–23)
CO2: 26 mEq/L (ref 19–32)
Calcium: 9.6 mg/dL (ref 8.4–10.5)
Chloride: 108 mEq/L (ref 96–112)
Creatinine, Ser: 1.65 mg/dL — ABNORMAL HIGH (ref 0.40–1.20)
GFR: 29.99 mL/min — ABNORMAL LOW (ref >60.00)
Glucose, Bld: 98 mg/dL (ref 70–99)
Potassium: 4.8 mEq/L (ref 3.5–5.1)
Sodium: 141 mEq/L (ref 135–145)

## 2021-08-16 MED ORDER — XTAMPZA ER 13.5 MG PO C12A: 1.0000 | EXTENDED_RELEASE_CAPSULE | Freq: Two times a day (BID) | ORAL | 0 refills | Status: DC | PRN

## 2021-08-16 NOTE — Patient Instructions (Signed)
Chronic Back Pain When back pain lasts longer than 3 months, it is called chronic back pain. Pain may get worse at certain times (flare-ups). There are things you can do at home to manage your pain. Follow these instructions at home: Pay attention to any changes in your symptoms. Take these actions to help with your pain: Managing pain and stiffness   If told, put ice on the painful area. Your doctor may tell you to use ice for 24-48 hours after the flare-up starts. To do this: Put ice in a plastic bag. Place a towel between your skin and the bag. Leave the ice on for 20 minutes, 2-3 times a day. If told, put heat on the painful area. Do this as often as told by your doctor. Use the heat source that your doctor recommends, such as a moist heat pack or a heating pad. Place a towel between your skin and the heat source. Leave the heat on for 20-30 minutes. Take off the heat if your skin turns bright red. This is especially important if you are unable to feel pain, heat, or cold. You may have a greater risk of getting burned. Soak in a warm bath. This can help relieve pain. Activity  Avoid bending and other activities that make pain worse. When standing: Keep your upper back and neck straight. Keep your shoulders pulled back. Avoid slouching. When sitting: Keep your back straight. Relax your shoulders. Do not round your shoulders or pull them backward. Do not sit or stand in one place for long periods of time. Take short rest breaks during the day. Lying down or standing is usually better than sitting. Resting can help relieve pain. When sitting or lying down for a long time, do some mild activity or stretching. This will help to prevent stiffness and pain. Get regular exercise. Ask your doctor what activities are safe for you. Do not lift anything that is heavier than 10 lb (4.5 kg) or the limit that you are told, until your doctor says that it is safe. To prevent injury when you lift  things: Bend your knees. Keep the weight close to your body. Avoid twisting. Sleep on a firm mattress. Try lying on your side with your knees slightly bent. If you lie on your back, put a pillow under your knees. Medicines Treatment may include medicines for pain and swelling taken by mouth or put on the skin, prescription pain medicine, or muscle relaxants. Take over-the-counter and prescription medicines only as told by your doctor. Ask your doctor if the medicine prescribed to you: Requires you to avoid driving or using machinery. Can cause trouble pooping (constipation). You may need to take these actions to prevent or treat trouble pooping: Drink enough fluid to keep your pee (urine) pale yellow. Take over-the-counter or prescription medicines. Eat foods that are high in fiber. These include beans, whole grains, and fresh fruits and vegetables. Limit foods that are high in fat and sugars. These include fried or sweet foods. General instructions Do not use any products that contain nicotine or tobacco, such as cigarettes, e-cigarettes, and chewing tobacco. If you need help quitting, ask your doctor. Keep all follow-up visits as told by your doctor. This is important. Contact a doctor if: Your pain does not get better with rest or medicine. Your pain gets worse, or you have new pain. You have a high fever. You lose weight very quickly. You have trouble doing your normal activities. Get help right away if: One   or both of your legs or feet feel weak. One or both of your legs or feet lose feeling (have numbness). You have trouble controlling when you poop (have a bowel movement) or pee (urinate). You have bad back pain and: You feel like you may vomit (nauseous), or you vomit. You have pain in your belly (abdomen). You have shortness of breath. You faint. Summary When back pain lasts longer than 3 months, it is called chronic back pain. Pain may get worse at certain times  (flare-ups). Use ice and heat as told by your doctor. Your doctor may tell you to use ice after flare-ups. This information is not intended to replace advice given to you by your health care provider. Make sure you discuss any questions you have with your health care provider. Document Revised: 10/14/2019 Document Reviewed: 10/14/2019 [employer] Patient Education  2022 [employer]  

## 2021-08-16 NOTE — Progress Notes (Signed)
Subjective:  Patient ID: Leslie Edwards, female    DOB: November 24, 1945  Age: 75 y.o. MRN: [id number]  CC: Hypertension and Back Pain  This visit occurred during the SARS-CoV-2 public health emergency.  Safety protocols were in place, including screening questions prior to the visit, additional usage of staff PPE, and extensive cleaning of exam room while observing appropriate contact time as indicated for disinfecting solutions.    HPI [name] presents for f/up -  She continues to complain of low back pain.  She gets some but brief symptom relief with IR oxycodone.  She is undergoing nerve blocks but has not gotten much improvement.  Outpatient Medications Prior to Visit  Medication Sig Dispense Refill   albuterol (PROVENTIL HFA;VENTOLIN HFA) 108 (90 Base) MCG/ACT inhaler Inhale 2 puffs into the lungs every 6 (six) hours as needed. 18 g 11   allopurinol (ZYLOPRIM) 100 MG tablet Take 1 tablet (100 mg total) by mouth daily. 90 tablet 1   Ascorbic Acid (VITAMIN C) 1000 MG tablet Take 1,000 mg by mouth daily.     b complex vitamins tablet Take 1 tablet by mouth daily.     buPROPion (WELLBUTRIN XL) 150 MG 24 hr tablet TAKE 3 TABLETS DAILY. 270 tablet 1   famotidine (PEPCID) 10 MG tablet Take 10 mg by mouth 2 (two) times daily as needed for heartburn or indigestion.     Fluticasone-Umeclidin-Vilant (TRELEGY ELLIPTA) 100-62.5-25 MCG/INH AEPB Inhale 1 puff into the lungs daily. 3 each 1   ipratropium-albuterol (DUONEB) 0.5-2.5 (3) MG/3ML SOLN Take 3 mLs by nebulization as needed. 360 mL 1   irbesartan (AVAPRO) 150 MG tablet Take 1 tablet (150 mg total) by mouth daily. 90 tablet 1   liothyronine (CYTOMEL) 25 MCG tablet Take 1 tablet (25 mcg total) by mouth daily. 90 tablet 1   mometasone (NASONEX) 50 MCG/ACT nasal spray Place 2 sprays into the nose as needed. 3 each 1   Multiple Vitamin (MULTIVITAMIN) tablet Take 1 tablet by mouth daily.     omega-3 acid ethyl esters (LOVAZA) 1 g capsule Take  2 capsules (2 g total) by mouth 2 (two) times daily. 360 capsule 1   potassium chloride (KLOR-CON) 10 MEQ tablet Take 1 tablet (10 mEq total) by mouth daily. 90 tablet 1   pravastatin (PRAVACHOL) 20 MG tablet Take 1 tablet (20 mg total) by mouth daily. 90 tablet 1   pregabalin (LYRICA) 75 MG capsule Take by mouth.     tiZANidine (ZANAFLEX) 2 MG tablet Take 1 tablet (2 mg total) by mouth at bedtime. 30 tablet 1   tiZANidine (ZANAFLEX) 4 MG tablet Take 1 tablet (4 mg total) by mouth at bedtime. 30 tablet 3   torsemide (DEMADEX) 20 MG tablet Take 1 tablet (20 mg total) by mouth daily. 90 tablet 1   Vilazodone HCl (VIIBRYD) 40 MG TABS TAKE ONE TABLET ONCE DAILY 30 tablet 5   oxyCODONE (OXY IR/ROXICODONE) 5 MG immediate release tablet Take 1 tablet (5 mg total) by mouth every 6 (six) hours as needed for severe pain. 90 tablet 0   levocetirizine (XYZAL) 5 MG tablet Take 2 tablets (10 mg total) by mouth every evening for 7 days. 14 tablet 0   No facility-administered medications prior to visit.    ROS Review of Systems  Constitutional:  Positive for unexpected weight change (wt gain). Negative for fatigue.  HENT:  Negative for trouble swallowing.   Eyes: Negative.   Respiratory:  Negative for cough, chest  tightness, shortness of breath and wheezing.   Cardiovascular:  Negative for chest pain, palpitations and leg swelling.  Gastrointestinal:  Negative for abdominal pain, constipation, diarrhea, nausea and vomiting.  Genitourinary: Negative.  Negative for difficulty urinating.  Musculoskeletal:  Positive for back pain. Negative for arthralgias and myalgias.  Neurological:  Negative for dizziness and weakness.  Hematological:  Negative for adenopathy. Does not bruise/bleed easily.  Psychiatric/Behavioral: Negative.     Objective:  BP 138/84 (BP Location: Right Arm, Patient Position: Sitting, Cuff Size: Large)   Pulse 79   Temp 98.4 F (36.9 C) (Oral)   Resp 16   Ht 5\' 3"  (1.6 m)   Wt 243 lb  (110.2 kg)   SpO2 96%   BMI 43.05 kg/m   BP Readings from Last 3 Encounters:  08/16/21 138/84  05/10/21 (!) 148/76  02/07/21 (!) 142/78    Wt Readings from Last 3 Encounters:  08/16/21 243 lb (110.2 kg)  05/10/21 234 lb 3.2 oz (106.2 kg)  02/07/21 227 lb (103 kg)    Physical Exam Vitals reviewed.  HENT:     Nose: Nose normal.     Mouth/Throat:     Mouth: Mucous membranes are moist.  Eyes:     General: No scleral icterus.    Conjunctiva/sclera: Conjunctivae normal.  Cardiovascular:     Rate and Rhythm: Normal rate and regular rhythm.     Heart sounds: No murmur heard. Pulmonary:     Effort: Pulmonary effort is normal.     Breath sounds: No stridor. No wheezing, rhonchi or rales.  Abdominal:     General: Abdomen is protuberant. Bowel sounds are normal. There is no distension.     Palpations: Abdomen is soft. There is no hepatomegaly, splenomegaly or mass.     Tenderness: There is no abdominal tenderness.  Musculoskeletal:        General: Normal range of motion.     Cervical back: Neck supple.  Lymphadenopathy:     Cervical: No cervical adenopathy.  Skin:    General: Skin is warm.  Neurological:     Mental Status: She is alert.  Psychiatric:        Mood and Affect: Mood normal.        Behavior: Behavior normal.    Lab Results  Component Value Date   WBC 6.8 05/10/2021   HGB 12.2 05/10/2021   HCT 36.4 05/10/2021   PLT 231.0 05/10/2021   GLUCOSE 90 05/10/2021   CHOL 198 11/09/2020   TRIG 166.0 (H) 11/09/2020   HDL 91.80 11/09/2020   LDLDIRECT 107.0 10/14/2019   LDLCALC 73 11/09/2020   ALT 32 11/09/2020   AST 25 11/09/2020   NA 137 05/10/2021   K 4.6 05/10/2021   CL 104 05/10/2021   CREATININE 1.20 05/10/2021   BUN 23 05/10/2021   CO2 25 05/10/2021   TSH 1.98 08/16/2021   HGBA1C 5.3 04/28/2009    MR LUMBAR SPINE WO CONTRAST  Result Date: 04/03/2021 CLINICAL DATA:  Low back pain radiating to the left leg primarily. Occasional symptoms on the right.  Numbness of the left leg. EXAM: MRI LUMBAR SPINE WITHOUT CONTRAST TECHNIQUE: Multiplanar, multisequence MR imaging of the lumbar spine was performed. No intravenous contrast was administered. COMPARISON:  Radiography 10/14/2020.  MRI 06/14/2014. FINDINGS: Segmentation:  5 lumbar type vertebral bodies. Alignment: Chronic scoliotic curvature convex to the left. Chronic degenerative anterolisthesis at L4-5, measuring 7 mm today, increased 2 mm since 2015. Vertebrae: No fracture or focal bone lesion. Discogenic  endplate marrow changes at T11-12 which are newly seen and could relate to regional pain. Conus medullaris and cauda equina: Conus extends to the L1 level. Conus and cauda equina appear normal. Paraspinal and other soft tissues: Negative Disc levels: T11-12: Disc degeneration with mild bulging of the disc. No compressive stenosis. Facet osteoarthritis. Discogenic endplate marrow changes as noted above, new since 2015, which could relate to regional pain. T12-L1: Normal interspace. L1-2: 2 mm retrolisthesis. Bulging of the disc with slight caudal down turning. Mild narrowing of the lateral recesses but no likely neural compression. Similar appearance the study of 2015. L2-3: Disc degeneration with endplate osteophytes and bulging of the disc. Mild facet and ligamentous hypertrophy. Mild narrowing of the lateral recesses and foramina. Similar appearance to the study of 2015. No definite neural compression. L3-4: Endplate osteophytes and bulging of the disc, with a small left foraminal disc herniation. Facet and ligamentous hypertrophy. Mild narrowing of the lateral recesses. Small left foraminal disc herniation likely to affect the left L3 nerve. The small left foraminal disc is new since 2015. L4-5: Chronic facet arthropathy, now with 7 mm of anterolisthesis, increased 2 mm since 2015. Endplate osteophytes and bulging of the disc. Moderate multifactorial stenosis at this level, left worse than right, that could  cause neural compression on either or both sides. This includes left foraminal stenosis that could affect the exiting L4 nerve. The facet joints are gaping and fluid-filled, in this appearance could worsen with standing or flexion. L5-S1: Minimal bulging of the disc. Facet arthropathy with gaping, fluid-filled joints. No compressive stenosis of the canal. Left foraminal narrowing because of encroachment by osteophyte and bulging disc material could affect the left L5 nerve. This appearance could worsen with standing or flexion. The facet arthropathy and the left foraminal stenosis have worsened since 2015. IMPRESSION: T11-12: Degenerative disc disease with discogenic edema the endplates, newly seen since 2015. This could relate to regional pain. L3-4: Newly seen small foraminal disc herniation on the left at L3-4 could focally affect the left L3 nerve. L4-5: Facet arthropathy with worsened anterolisthesis, now measuring 7 mm, increased 2 mm since 2015. Multifactorial stenosis at this level that could cause neural compression on either or both sides. Foraminal stenosis on the left could affect the exiting L4 nerve. The facet joints are gaping and fluid-filled in the appearance could worsen with standing or flexion. L5-S1: Worsening of facet arthropathy with gaping, fluid-filled joints. Foraminal stenosis on the left that could affect the exiting left L5 nerve. The appearance could worsen with standing or flexion. This appearance has worsened since 2015. Electronically Signed   By: Nelson Chimes M.D.   On: 04/03/2021 11:18    Assessment & Plan:   Lorilynn was seen today for hypertension and back pain.  Diagnoses and all orders for this visit:  Essential hypertension- Her blood pressure is adequately well controlled. -     Basic metabolic panel; Future -     Basic metabolic panel  Other specified hypothyroidism- Her TFTs are normal. -     Thyroid Panel With TSH; Future -     Thyroid Panel With  TSH  Parathyroid hormone excess ([place])- Her Ca++ is normal. -     Basic metabolic panel; Future -     Thyroid Panel With TSH; Future -     Thyroid Panel With TSH -     Basic metabolic panel  Kidney disease, chronic, stage IV (GFR 15-29 ml/min) (HCC)- Her renal function has declined slightly.  She  will avoid nephrotoxic agents. -     Basic metabolic panel; Future -     Basic metabolic panel  Spinal stenosis, lumbar region, with neurogenic claudication- Will increase the dose of oxycodone and will switch to an extended release formulation. -     oxyCODONE ER (XTAMPZA ER) 13.5 MG C12A; Take 1 tablet by mouth 2 (two) times daily as needed.  Encounter for general adult medical examination with abnormal findings- Exam completed, labs reviewed, vaccines reviewed and updated, cancer screenings are up-to-date, patient education was given.  Long-term current use of opiate analgesic -     naloxone (NARCAN) nasal spray 4 mg/0.1 mL; Place 1 spray into the nose once for 1 dose.  I have discontinued Ysabela H. Mordan's oxyCODONE. I am also having her start on Xtampza ER and naloxone. Additionally, I am having her maintain her albuterol, levocetirizine, vitamin C, multivitamin, b complex vitamins, famotidine, buPROPion, tiZANidine, Trelegy Ellipta, ipratropium-albuterol, mometasone, tiZANidine, allopurinol, irbesartan, liothyronine, omega-3 acid ethyl esters, potassium chloride, pravastatin, torsemide, pregabalin, and Vilazodone HCl.  Meds ordered this encounter  Medications   oxyCODONE ER (XTAMPZA ER) 13.5 MG C12A    Sig: Take 1 tablet by mouth 2 (two) times daily as needed.    Dispense:  60 capsule    Refill:  0   naloxone (NARCAN) nasal spray 4 mg/0.1 mL    Sig: Place 1 spray into the nose once for 1 dose.    Dispense:  2 each    Refill:  2     Follow-up: Return in about 3 months (around 11/14/2021).  Scarlette Calico, MD

## 2021-08-17 DIAGNOSIS — I129 Hypertensive chronic kidney disease with stage 1 through stage 4 chronic kidney disease, or unspecified chronic kidney disease: Secondary | ICD-10-CM | POA: Diagnosis not present

## 2021-08-17 DIAGNOSIS — N183 Chronic kidney disease, stage 3 unspecified: Secondary | ICD-10-CM | POA: Diagnosis not present

## 2021-08-17 DIAGNOSIS — R609 Edema, unspecified: Secondary | ICD-10-CM | POA: Diagnosis not present

## 2021-08-17 LAB — THYROID PANEL WITH TSH
Free Thyroxine Index: 1.4 (ref 1.4–3.8)
T3 Uptake: 31 % (ref 22–35)
T4, Total: 4.4 ug/dL — ABNORMAL LOW (ref 5.1–11.9)
TSH: 1.98 mIU/L (ref 0.40–4.50)

## 2021-08-17 MED ORDER — NALOXONE HCL 4 MG/0.1ML NA LIQD: 1.0000 | Freq: Once | NASAL | 2 refills | Status: AC

## 2021-08-18 ENCOUNTER — Telehealth: Payer: Self-pay | Admitting: Internal Medicine

## 2021-08-18 NOTE — Telephone Encounter (Signed)
Patient calling in  Patient says she spoke w/ pharmacy & they advised her that a PA is needed for rx oxyCODONE ER (XTAMPZA ER) 13.5 MG C12A   Wants to know if provider can send in rx for regular strength Oxycodone instead of the ER  Please call patient [phone]

## 2021-08-18 NOTE — Telephone Encounter (Signed)
Key: [id number]

## 2021-08-20 ENCOUNTER — Encounter: Payer: Self-pay | Admitting: Internal Medicine

## 2021-08-20 ENCOUNTER — Other Ambulatory Visit: Payer: Self-pay | Admitting: Internal Medicine

## 2021-08-20 DIAGNOSIS — M48062 Spinal stenosis, lumbar region with neurogenic claudication: Secondary | ICD-10-CM

## 2021-08-21 ENCOUNTER — Telehealth: Payer: Self-pay

## 2021-08-21 DIAGNOSIS — M47816 Spondylosis without myelopathy or radiculopathy, lumbar region: Secondary | ICD-10-CM | POA: Diagnosis not present

## 2021-08-21 DIAGNOSIS — M4156 Other secondary scoliosis, lumbar region: Secondary | ICD-10-CM | POA: Diagnosis not present

## 2021-08-21 MED ORDER — XTAMPZA ER 13.5 MG PO C12A: 1.0000 | EXTENDED_RELEASE_CAPSULE | Freq: Two times a day (BID) | ORAL | 0 refills | Status: DC | PRN

## 2021-08-21 NOTE — Progress Notes (Signed)
Chronic Care Management Pharmacy Assistant   Name: Leslie Edwards  MRN: [id number] DOB: Feb 20, 1946   Reason for Encounter: Disease State   Conditions to be addressed/monitored: General   Recent office visits:  08/16/21 Janith Lima, MD-PCP (Essential hypertension, Back Pain) Labs: BMP, Thyroid and TSH, Med changes:d/c oxycodone, started on xtampza and naloxone  05/10/21 Janith Lima, MD-PCP (Essential hypertension) Labs: BMP, Thyroid and TSH and cbc, no med changes  Recent consult visits:  None ID  Hospital visits:  None in previous 6 months  Medications: Outpatient Encounter Medications as of 08/21/2021  Medication Sig   albuterol (PROVENTIL HFA;VENTOLIN HFA) 108 (90 Base) MCG/ACT inhaler Inhale 2 puffs into the lungs every 6 (six) hours as needed.   allopurinol (ZYLOPRIM) 100 MG tablet Take 1 tablet (100 mg total) by mouth daily.   Ascorbic Acid (VITAMIN C) 1000 MG tablet Take 1,000 mg by mouth daily.   b complex vitamins tablet Take 1 tablet by mouth daily.   buPROPion (WELLBUTRIN XL) 150 MG 24 hr tablet TAKE 3 TABLETS DAILY.   famotidine (PEPCID) 10 MG tablet Take 10 mg by mouth 2 (two) times daily as needed for heartburn or indigestion.   Fluticasone-Umeclidin-Vilant (TRELEGY ELLIPTA) 100-62.5-25 MCG/INH AEPB Inhale 1 puff into the lungs daily.   ipratropium-albuterol (DUONEB) 0.5-2.5 (3) MG/3ML SOLN Take 3 mLs by nebulization as needed.   irbesartan (AVAPRO) 150 MG tablet Take 1 tablet (150 mg total) by mouth daily.   levocetirizine (XYZAL) 5 MG tablet Take 2 tablets (10 mg total) by mouth every evening for 7 days.   liothyronine (CYTOMEL) 25 MCG tablet Take 1 tablet (25 mcg total) by mouth daily.   mometasone (NASONEX) 50 MCG/ACT nasal spray Place 2 sprays into the nose as needed.   Multiple Vitamin (MULTIVITAMIN) tablet Take 1 tablet by mouth daily.   omega-3 acid ethyl esters (LOVAZA) 1 g capsule Take 2 capsules (2 g total) by mouth 2 (two) times daily.    oxyCODONE ER (XTAMPZA ER) 13.5 MG C12A Take 1 tablet by mouth 2 (two) times daily as needed.   potassium chloride (KLOR-CON) 10 MEQ tablet Take 1 tablet (10 mEq total) by mouth daily.   pravastatin (PRAVACHOL) 20 MG tablet Take 1 tablet (20 mg total) by mouth daily.   pregabalin (LYRICA) 75 MG capsule Take by mouth.   tiZANidine (ZANAFLEX) 2 MG tablet Take 1 tablet (2 mg total) by mouth at bedtime.   tiZANidine (ZANAFLEX) 4 MG tablet Take 1 tablet (4 mg total) by mouth at bedtime.   torsemide (DEMADEX) 20 MG tablet Take 1 tablet (20 mg total) by mouth daily.   Vilazodone HCl (VIIBRYD) 40 MG TABS TAKE ONE TABLET ONCE DAILY   [DISCONTINUED] K-DUR 10 MEQ tablet TAKE 1 TABLET DAILY.   No facility-administered encounter medications on file as of 08/21/2021.   Have you had any problems recently with your health?Husband states patient is still having some pain in her back and legs  Have you had any problems with your pharmacy?Husband states patient does not have any pain meds and she needs them. She stated that her insurance will not pay for it and she needs some help  What issues or side effects are you having with your medications?Patient is not having any side effects to medications  What would you like me to pass along to [facility] for them to help you with? Husband states that she needs something soon for pain  What can we do to  take care of you better? He states at this time nothing  Care Gaps: Colonoscopy-NA Diabetic Foot Exam-NA Mammogram-NA Ophthalmology-NA Dexa Scan - Na Annual Well Visit - 11/09/20 Micro albumin-NA Hemoglobin A1c- NA  Star Rating Drugs: Irbesartan 150 mg-last fill 07/10/21 90 ds Pravastatin 20 mg-last fill 07/10/21 90 ds  Ethelene Hal Clinical Pharmacist Assistant [phone]

## 2021-08-21 NOTE — Telephone Encounter (Signed)
Additional info requested via fax.  Completed form and returned.

## 2021-08-22 ENCOUNTER — Other Ambulatory Visit: Payer: Self-pay | Admitting: Internal Medicine

## 2021-08-22 DIAGNOSIS — M170 Bilateral primary osteoarthritis of knee: Secondary | ICD-10-CM

## 2021-08-22 DIAGNOSIS — M5136 Other intervertebral disc degeneration, lumbar region: Secondary | ICD-10-CM

## 2021-08-22 DIAGNOSIS — M48062 Spinal stenosis, lumbar region with neurogenic claudication: Secondary | ICD-10-CM

## 2021-08-22 MED ORDER — OXYCODONE HCL 5 MG PO TABS
5.0000 mg | ORAL_TABLET | ORAL | 0 refills | Status: DC | PRN
Start: 2021-08-22 — End: 2021-10-04

## 2021-08-29 DIAGNOSIS — M5416 Radiculopathy, lumbar region: Secondary | ICD-10-CM | POA: Diagnosis not present

## 2021-09-22 ENCOUNTER — Ambulatory Visit (INDEPENDENT_AMBULATORY_CARE_PROVIDER_SITE_OTHER): Payer: Medicare Other

## 2021-09-22 ENCOUNTER — Telehealth: Payer: Medicare Other

## 2021-09-22 DIAGNOSIS — M48062 Spinal stenosis, lumbar region with neurogenic claudication: Secondary | ICD-10-CM

## 2021-09-22 DIAGNOSIS — I10 Essential (primary) hypertension: Secondary | ICD-10-CM

## 2021-09-22 DIAGNOSIS — N184 Chronic kidney disease, stage 4 (severe): Secondary | ICD-10-CM

## 2021-09-22 DIAGNOSIS — E038 Other specified hypothyroidism: Secondary | ICD-10-CM

## 2021-09-22 NOTE — Progress Notes (Signed)
Chronic Care Management Pharmacy Note  09/22/2021 Name:  Leslie Edwards MRN:  [id number] DOB:  April 28, 1946  Summary: -Patient notes that since switching back to pravastatin from rosuvastatin she is unsure if there has been a major change in her pain levels as it is hard for her to differentiate pain from statin and her chronic back pain -Patient was unable to start xtampza as insurance did not approve medication, has been using oxycodone IR and lyrica with success  -Has not been able to check BP at home, notes to monitoring ~every 2 weeks with office visits, reports BP is averaging 140/70 -Reports that cough / breathing has greatly improved since starting trelegy - no longer having to use duoneb inhalers or her albuterol inhaler - now only using trelegy inhaler ~once every 2 weeks    Recommendations/Changes made from today's visit: -Recommending no changes to medications, would recommend for repeat lipid panel with next visit as she was switched to pravastatin, should LDL be able goal would recommend increase in dosage / intensity   Plan: F/u in 6 months    Subjective: Leslie Edwards is an 76 y.o. year old female who is a primary patient of Janith Lima, MD.  The CCM team was consulted for assistance with disease management and care coordination needs.    Engaged with patient by telephone for follow up visit in response to provider referral for pharmacy case management and/or care coordination services.   Consent to Services:  The patient was given information about Chronic Care Management services, agreed to services, and gave verbal consent prior to initiation of services.  Please see initial visit note for detailed documentation.   Patient Care Team: Janith Lima, MD as PCP - General (Internal Medicine) Charlton Haws, [facility] as Pharmacist (Pharmacist)  Recent office visits: 07/30/2021 - Dr. Ronnald Ramp  - switched to long acting opioid - Xtampza ER 13.61m BID  05/10/2021 -  Dr. JRonnald Ramp - evaluation of HTN and hypothyroidism - no changes to medications - f/u in 3 months   Recent consult visits: PT for muscle weakness/instability. 12/01/20 Dr RAdam Phenix(phys med): f/u spinal stenosis. Referred to PT.  Hospital visits: None in previous 6 months  Objective:  Lab Results  Component Value Date   CREATININE 1.65 (H) 08/16/2021   BUN 30 (H) 08/16/2021   GFR 29.99 (L) 08/16/2021   GFRNONAA 38 (L) 04/12/2020   GFRAA 44 (L) 04/12/2020   NA 141 08/16/2021   K 4.8 08/16/2021   CALCIUM 9.6 08/16/2021   CO2 26 08/16/2021   GLUCOSE 98 08/16/2021    Lab Results  Component Value Date/Time   HGBA1C 5.3 04/28/2009 12:03 PM   GFR 29.99 (L) 08/16/2021 11:02 AM   GFR 44.03 (L) 05/10/2021 11:26 AM    Last diabetic Eye exam:  Lab Results  Component Value Date/Time   HMDIABEYEEXA No Retinopathy 04/28/2018 12:00 AM    Last diabetic Foot exam: No results found for: HMDIABFOOTEX   Lab Results  Component Value Date   CHOL 198 11/09/2020   HDL 91.80 11/09/2020   LDLCALC 73 11/09/2020   LDLDIRECT 107.0 10/14/2019   TRIG 166.0 (H) 11/09/2020   CHOLHDL 2 11/09/2020    Hepatic Function Latest Ref Rng & Units 11/09/2020 10/30/2018 10/21/2017  Total Protein 6.0 - 8.3 g/dL 7.3 7.0 7.2  Albumin 3.5 - 5.2 g/dL 4.5 4.6 4.6  AST 0 - 37 U/L _0 ALT 0 - 35 U/L 32 17 17  Alk Phosphatase 39 - 117 U/L 76 59 55  Total Bilirubin 0.2 - 1.2 mg/dL 0.8 0.7 0.5  Bilirubin, Direct 0.0 - 0.3 mg/dL 0.2 - -    Lab Results  Component Value Date/Time   TSH 1.98 08/16/2021 11:02 AM   TSH 3.95 05/10/2021 11:26 AM   FREET4 0.86 10/31/2015 03:05 PM   FREET4 0.89 11/06/2013 11:14 AM    CBC Latest Ref Rng & Units 05/10/2021 11/09/2020 10/14/2019  WBC 4.0 - 10.5 K/uL 6.8 6.4 6.5  Hemoglobin 12.0 - 15.0 g/dL 12.2 13.1 13.3  Hematocrit 36.0 - 46.0 % 36.4 38.3 39.3  Platelets 150.0 - 400.0 K/uL 231.0 264.0 293.0    Lab Results  Component Value Date/Time   VD25OH 34.42 10/14/2019 11:55  AM   VD25OH 67.69 04/17/2017 09:58 AM    Clinical ASCVD: No  The 10-year ASCVD risk score (Arnett DK, et al., 2019) is: 27.2%   Values used to calculate the score:     Age: 80 years     Sex: Female     Is Non-Hispanic African American: No     Diabetic: No     Tobacco smoker: No     Systolic Blood Pressure: 865 mmHg     Is BP treated: Yes     HDL Cholesterol: 91.8 mg/dL     Total Cholesterol: 198 mg/dL    Depression screen [facility] 2/9 08/16/2021 12/01/2020 10/14/2020  Decreased Interest 0 3 0  Down, Depressed, Hopeless 1 2 0  PHQ - 2 Score 1 5 0  Altered sleeping 0 3 0  Tired, decreased energy 0 2 0  Change in appetite 0 3 0  Feeling bad or failure about yourself  0 0 0  Trouble concentrating 0 3 0  Moving slowly or fidgety/restless 0 1 0  Suicidal thoughts 0 0 0  PHQ-9 Score 1 17 0  Difficult doing work/chores - - -  Some recent data might be hidden    GAD 7 : Generalized Anxiety Score 04/17/2017  Nervous, Anxious, on Edge 2  Control/stop worrying 3  Worry too much - different things 3  Trouble relaxing 2  Restless 1  Easily annoyed or irritable 2  Afraid - awful might happen 3  Total GAD 7 Score 16       Social History   Tobacco Use  Smoking Status Former   Types: Cigarettes   Quit date: 01/22/1975   Years since quitting: 46.6  Smokeless Tobacco Never   BP Readings from Last 3 Encounters:  08/16/21 138/84  05/10/21 (!) 148/76  02/07/21 (!) 142/78   Pulse Readings from Last 3 Encounters:  08/16/21 79  05/10/21 68  02/07/21 80   Wt Readings from Last 3 Encounters:  08/16/21 243 lb (110.2 kg)  05/10/21 234 lb 3.2 oz (106.2 kg)  02/07/21 227 lb (103 kg)   BMI Readings from Last 3 Encounters:  08/16/21 43.05 kg/m  05/10/21 41.49 kg/m  02/07/21 40.21 kg/m   Assessment/Interventions: Review of patient past medical history, allergies, medications, health status, including review of consultants reports, laboratory and other test data, was performed as part  of comprehensive evaluation and provision of chronic care management services.   SDOH:  (Social Determinants of Health) assessments and interventions performed: Yes  SDOH Screenings   Alcohol Screen: Not on file  Depression (PHQ2-9): Low Risk    PHQ-2 Score: 1  Financial Resource Strain: Not on file  Food Insecurity: Not on file  Housing: Not on file  Physical  Activity: Not on file  Social Connections: Not on file  Stress: Not on file  Tobacco Use: Medium Risk   Smoking Tobacco Use: Former   Smokeless Tobacco Use: Never   Passive Exposure: Not on file  Transportation Needs: Not on file    CCM Care Plan  Allergies  Allergen Reactions   Penicillins     REACTION: difficulty breathing   Prochlorperazine Edisylate     Medications Reviewed Today     Reviewed by Tomasa Blase, [facility] (Pharmacist) on 09/22/21 at 1149  Med List Status: <None>   Medication Order Taking? Sig Documenting Provider Last Dose Status Informant  albuterol (PROVENTIL HFA;VENTOLIN HFA) 108 (90 Base) MCG/ACT inhaler [id number] No Inhale 2 puffs into the lungs every 6 (six) hours as needed.  Patient not taking: Reported on 09/22/2021   Janith Lima, MD Not Taking Active   allopurinol (ZYLOPRIM) 100 MG tablet [id number] Yes Take 1 tablet (100 mg total) by mouth daily. Janith Lima, MD Taking Active   Ascorbic Acid (VITAMIN C) 1000 MG tablet [id number] Yes Take 1,000 mg by mouth daily. [provider] Taking Active Self  b complex vitamins tablet [id number] Yes Take 1 tablet by mouth daily. [provider] Taking Active   buPROPion (WELLBUTRIN XL) 150 MG 24 hr tablet [id number] Yes TAKE 3 TABLETS DAILY. Janith Lima, MD Taking Active   famotidine (PEPCID) 10 MG tablet [id number] No Take 10 mg by mouth 2 (two) times daily as needed for heartburn or indigestion.  Patient not taking: Reported on 09/22/2021   [provider] Not Taking Active Self  Fluticasone-Umeclidin-Vilant (TRELEGY  ELLIPTA) 100-62.5-25 MCG/INH AEPB [id number] Yes Inhale 1 puff into the lungs daily. Janith Lima, MD Taking Active   ipratropium-albuterol (DUONEB) 0.5-2.5 (3) MG/3ML SOLN [id number] No Take 3 mLs by nebulization as needed.  Patient not taking: Reported on 09/22/2021   Janith Lima, MD Not Taking Active   irbesartan (AVAPRO) 150 MG tablet [id number] Yes Take 1 tablet (150 mg total) by mouth daily. Janith Lima, MD Taking Active     Discontinued 03/16/13 1210 (Reorder)   levocetirizine (XYZAL) 5 MG tablet [id number]  Take 2 tablets (10 mg total) by mouth every evening for 7 days. Janith Lima, MD  Expired 06/29/19 2359   liothyronine (CYTOMEL) 25 MCG tablet [id number] Yes Take 1 tablet (25 mcg total) by mouth daily. Janith Lima, MD Taking Active   mometasone (NASONEX) 50 MCG/ACT nasal spray [id number] Yes Place 2 sprays into the nose as needed. Janith Lima, MD Taking Active   Multiple Vitamin (MULTIVITAMIN) tablet [id number] Yes Take 1 tablet by mouth daily. [provider] Taking Active   omega-3 acid ethyl esters (LOVAZA) 1 g capsule [id number] Yes Take 2 capsules (2 g total) by mouth 2 (two) times daily. Janith Lima, MD Taking Active   oxyCODONE (OXY IR/ROXICODONE) 5 MG immediate release tablet [id number] Yes Take 1 tablet (5 mg total) by mouth every 4 (four) hours as needed for severe pain. Janith Lima, MD Taking Active   oxyCODONE ER [facility] ER) 13.5 MG C12A [id number] No Take 1 tablet by mouth 2 (two) times daily as needed.  Patient not taking: Reported on 09/22/2021   Janith Lima, MD Not Taking Active   potassium chloride (KLOR-CON) 10 MEQ tablet [id number] Yes Take 1 tablet (10 mEq total) by mouth daily. Janith Lima, MD Taking Active   pravastatin (PRAVACHOL) 20 MG tablet [id number] Yes Take 1  tablet (20 mg total) by mouth daily. Janith Lima, MD Taking Active   pregabalin (LYRICA) 75 MG capsule [id number] Yes Take 75 mg by mouth 3 (three) times daily. Takes  2-3 times daily as needed [provider] Taking Active   torsemide (DEMADEX) 20 MG tablet [id number] Yes Take 1 tablet (20 mg total) by mouth daily. Janith Lima, MD Taking Active   Vilazodone HCl (VIIBRYD) 40 MG TABS [id number] Yes TAKE ONE TABLET ONCE DAILY Janith Lima, MD Taking Active             Patient Active Problem List   Diagnosis Date Noted   Encounter for general adult medical examination with abnormal findings 11/09/2020   DDD (degenerative disc disease), lumbar 10/14/2020   Hyperlipidemia with target LDL less than 100 10/15/2019   Routine general medical examination at a health care facility 10/14/2019   Chronic gout due to renal impairment of multiple sites without tophus 04/13/2019   LVH (left ventricular hypertrophy) due to hypertensive disease, with heart failure ([place]) 79/15/0569   Diastolic dysfunction with chronic heart failure ([place]) 11/20/2018   Esophageal dysphagia 10/30/2018   Primary osteoarthritis of both knees 08/28/2016   Radiculitis of right cervical region 07/30/2016   Ocular rosacea 12/21/2015   Pure hyperglyceridemia 11/01/2015   Nonspecific abnormal electrocardiogram (ECG) (EKG) 10/31/2015   Nuclear sclerosis of both eyes 01/05/2015   Heart murmur, systolic 79/48/0165   Depression with anxiety 07/22/2014   Asthma, intermittent 06/03/2014   Visit for screening mammogram 06/03/2014   Spinal stenosis, lumbar region, with neurogenic claudication 06/03/2014   Kidney disease, chronic, stage IV (GFR 15-29 ml/min) ([place]) 06/03/2014   Parathyroid hormone excess ([place]) 08/31/2013   Celiac disease 08/18/2007   Essential hypertension 05/06/2007   Allergic rhinitis 05/06/2007   OSTEOPOROSIS 05/06/2007   Other specified hypothyroidism 04/28/2007   Morbid obesity ([place]) 04/28/2007   GERD 04/28/2007    Immunization History  Administered Date(s) Administered   Fluad Quad(high Dose 65+) 06/30/2019   Influenza Split 07/23/2011, 06/24/2012    Influenza Whole 07/18/2007, 06/14/2008, 06/13/2010   Influenza, High Dose Seasonal PF 07/24/2013, 06/12/2016, 07/02/2017, 07/03/2018   Influenza,inj,Quad PF,6+ Mos 06/03/2014, 06/30/2015   Influenza-Unspecified 07/20/2020, 06/29/2021   Moderna SARS-COV2 Booster Vaccination 06/17/2021   PFIZER(Purple Top)SARS-COV-2 Vaccination 10/07/2019, 10/28/2019, 07/02/2020   Pneumococcal Conjugate-13 07/24/2013   Pneumococcal Polysaccharide-23 06/30/2015, 11/09/2020   Td 12/17/1998   Tdap 06/24/2012   Zoster Recombinat (Shingrix) 03/18/2018, 08/07/2018   Zoster, Live 05/01/2011    Conditions to be addressed/monitored:  Hypertension, Hyperlipidemia, Heart Failure, Asthma, Chronic Kidney Disease, Hypothyroidism, Depression, Anxiety, Osteopenia and Gout  Care Plan : [facility]M Pharmacy Care Plan  Updates made by Tomasa Blase, RPH since 09/22/2021 12:00 AM     Problem: Hypertension, Hyperlipidemia, Heart Failure, Asthma, Chronic Kidney Disease, Hypothyroidism, Depression, Anxiety, Osteopenia and Gout   Priority: High     Long-Range Goal: Disease management   Start Date: 01/10/2021  Expected End Date: 09/22/2022  This Visit's Progress: On track  Recent Progress: On track  Priority: High  Note:   Current Barriers:  Unable to independently monitor therapeutic efficacy  Pharmacist Clinical Goal(s):  Patient will achieve adherence to monitoring guidelines and medication adherence to achieve therapeutic efficacy  Interventions: 1:1 collaboration with Janith Lima, MD regarding development and update of comprehensive plan of care as evidenced by provider attestation and co-signature Inter-disciplinary care team collaboration (see longitudinal plan of care) Comprehensive medication review performed; medication list updated in electronic medical record  Hyperlipidemia: (  LDL goal < 100) Unknown - switched back to pravastatin due to muscle pains and cramps - LDL has not been rechecked since switching  back  Lab Results  Component Value Date   LDLCALC 73 11/09/2020  -Current treatment: Pravastatin 39m daily  Lovaza 1 g - 2 cap BID Coenzyme Q10 30 mg TID -Medications previously tried: rosuvastatin   -Recommended for patient to continue current medications, to have lipid panel rechecked with next PCP appointment - pravastatin dosage could be increased should LDL not be at goal / patient could also start PSK9 as she was interested in with previous appointments    Heart Failure / HTN / CKD 3b (Goal: BP < 130/80 and prevent exacerbations) -Controlled -Last ejection fraction: 60-65% (Date: 11/2018) -HF type: Diastolic -NYHA Class: not specified -Current treatment: Irbesartan 150 mg daily Torsemide 20 mg daily [place]lor con 10 meq daily -Medications previously tried: furosemide, losartan, metolazone, telmisartan -Educated on Benefits of medications for managing symptoms and prolonging life -estimated CrCl = 457mmin - lyrica dosing remains appropriate at this time  -Recommended to continue current medication  Asthma (Goal: control symptoms and prevent exacerbations) -Controlled - improved - since starting trelegy patient seldom uses albuterol inhaler - no longer has to use duonebs - currently using trelegy about once every 2 weeks  -Current treatment  Trelegy 100-62.5-25mcg/act - 1 puff daily  Duoneb PRN - no longer having to use  Albuterol HFA prn -Pulmonary function testing: not on file -Exacerbations requiring treatment in last 6 months: none -Patient reports consistent use of maintenance inhaler -Frequency of rescue inhaler use: infrequent -Counseled on Proper inhaler technique; Benefits of consistent maintenance inhaler use -Recommended to continue current medication  Osteopenia (Goal prevent fractures) -Not ideally controlled - pt is not taking Calcium/vitamin D -Last DEXA Scan: 10/2015  T-Score femoral neck: -2.1  T-Score lumbar spine: -0.1  10-year probability of major  osteoporotic fracture: 11.7%  10-year probability of hip fracture: 2.4% -Patient is not a candidate for pharmacologic treatment -Recommend [phone] units of vitamin D daily. Recommend 1200 mg of calcium daily from dietary and supplemental sources.  Hypothyroidism (Goal: maintain TSH in goal range) -Controlled  Lab Results  Component Value Date   TSH 1.98 08/16/2021  -Current treatment  Liothyronine 25 mcg daily -Advised continue current medications   Depression/Anxiety (Goal: manage symptoms) -Controlled -Current treatment: Bupropion XL 150 mg - 3 tablets daily Viibryd 40 mg daily -PHQ9: 17 (11/2020) -GAD7: 16 (04/2017) -Connected with PCP for mental health support -Educated on Benefits of medication for symptom control -Recommended to continue current medication  Gout (Goal: prevent flares) -Controlled -Current treatment  Allopurinol 100 mg daily -Recommended to continue current medication  Patient Goals/Self-Care Activities Patient will:  - take medications as prescribed focus on medication adherence by pill box check blood pressure weekly, document, and provide at future appointments  Follow Up Plan: Telephone follow up appointment with care management team member scheduled for: 6 months       Medication Assistance: None required.  Patient affirms current coverage meets needs.  [facility]:  [facility]: [phone]: [phone] [place] [id number]: [phone]: [phone] [place] [address]one: [phone]: [phone]  Uses pill box? Yes Pt endorses 100% compliance  We discussed: Current pharmacy is preferred with  insurance plan and patient is satisfied with pharmacy services Patient decided to: Continue current medication management strategy  Care Plan and Follow Up Patient Decision:  Patient agrees to Care Plan and Follow-up.  Plan: Telephone follow up appointment with care management team member scheduled for:  2 months  Tomasa Blase, PharmD Clinical Pharmacist, [facility]

## 2021-09-22 NOTE — Patient Instructions (Signed)
Visit Information  Following are the goals we discussed today:   Manage My Medicine   Timeframe:  Long-Range Goal Priority:  Medium Start Date:  09/22/2021                           Expected End Date:  09/22/2022                     Follow Up Date 03/22/2022   - call for medicine refill 2 or 3 days before it runs out - call if I am sick and can't take my medicine - keep a list of all the medicines I take; vitamins and herbals too - learn to read medicine labels.    Why is this important?   These steps will help you keep on track with your medicines.  Plan: Telephone follow up appointment with care management team member scheduled for:  months  The patient has been provided with contact information for the care management team and has been advised to call with any health related questions or concerns.   Tomasa Blase, PharmD Clinical Pharmacist, [facility]   Please call the care guide team at [phone] if you need to cancel or reschedule your appointment.   Patient verbalizes understanding of instructions provided today and agrees to view in [place].

## 2021-09-28 ENCOUNTER — Other Ambulatory Visit: Payer: Self-pay | Admitting: Internal Medicine

## 2021-09-28 DIAGNOSIS — I10 Essential (primary) hypertension: Secondary | ICD-10-CM

## 2021-09-28 DIAGNOSIS — M47816 Spondylosis without myelopathy or radiculopathy, lumbar region: Secondary | ICD-10-CM | POA: Diagnosis not present

## 2021-09-28 DIAGNOSIS — M1A39X0 Chronic gout due to renal impairment, multiple sites, without tophus (tophi): Secondary | ICD-10-CM

## 2021-09-28 DIAGNOSIS — E038 Other specified hypothyroidism: Secondary | ICD-10-CM

## 2021-09-28 DIAGNOSIS — E785 Hyperlipidemia, unspecified: Secondary | ICD-10-CM

## 2021-09-28 DIAGNOSIS — M5416 Radiculopathy, lumbar region: Secondary | ICD-10-CM | POA: Diagnosis not present

## 2021-09-28 DIAGNOSIS — F418 Other specified anxiety disorders: Secondary | ICD-10-CM

## 2021-09-28 MED ORDER — ALLOPURINOL 100 MG PO TABS: 100.0000 mg | ORAL_TABLET | Freq: Every day | ORAL | 1 refills | Status: DC

## 2021-09-28 MED ORDER — BUPROPION HCL ER (XL) 150 MG PO TB24: 450.0000 mg | ORAL_TABLET | Freq: Every day | ORAL | 1 refills | Status: DC

## 2021-09-28 MED ORDER — PRAVASTATIN SODIUM 20 MG PO TABS: 20.0000 mg | ORAL_TABLET | Freq: Every day | ORAL | 1 refills | Status: DC

## 2021-09-28 MED ORDER — POTASSIUM CHLORIDE ER 10 MEQ PO TBCR: 10.0000 meq | EXTENDED_RELEASE_TABLET | Freq: Every day | ORAL | 1 refills | Status: DC

## 2021-09-28 MED ORDER — LIOTHYRONINE SODIUM 25 MCG PO TABS: 25.0000 ug | ORAL_TABLET | Freq: Every day | ORAL | 1 refills | Status: DC

## 2021-09-28 MED ORDER — IRBESARTAN 150 MG PO TABS: 150.0000 mg | ORAL_TABLET | Freq: Every day | ORAL | 1 refills | Status: DC

## 2021-10-04 ENCOUNTER — Other Ambulatory Visit: Payer: Self-pay | Admitting: Internal Medicine

## 2021-10-04 DIAGNOSIS — M48062 Spinal stenosis, lumbar region with neurogenic claudication: Secondary | ICD-10-CM

## 2021-10-04 DIAGNOSIS — M170 Bilateral primary osteoarthritis of knee: Secondary | ICD-10-CM

## 2021-10-04 DIAGNOSIS — M5416 Radiculopathy, lumbar region: Secondary | ICD-10-CM | POA: Diagnosis not present

## 2021-10-04 DIAGNOSIS — M51369 Other intervertebral disc degeneration, lumbar region without mention of lumbar back pain or lower extremity pain: Secondary | ICD-10-CM

## 2021-10-04 DIAGNOSIS — I10 Essential (primary) hypertension: Secondary | ICD-10-CM

## 2021-10-04 DIAGNOSIS — M5136 Other intervertebral disc degeneration, lumbar region: Secondary | ICD-10-CM

## 2021-10-04 MED ORDER — OXYCODONE HCL 5 MG PO TABS: 5.0000 mg | ORAL_TABLET | ORAL | 0 refills | Status: DC | PRN

## 2021-10-17 DIAGNOSIS — N184 Chronic kidney disease, stage 4 (severe): Secondary | ICD-10-CM

## 2021-10-17 DIAGNOSIS — E038 Other specified hypothyroidism: Secondary | ICD-10-CM | POA: Diagnosis not present

## 2021-10-17 DIAGNOSIS — I10 Essential (primary) hypertension: Secondary | ICD-10-CM | POA: Diagnosis not present

## 2021-10-25 ENCOUNTER — Other Ambulatory Visit: Payer: Self-pay | Admitting: Rehabilitation

## 2021-10-25 DIAGNOSIS — M5416 Radiculopathy, lumbar region: Secondary | ICD-10-CM

## 2021-10-25 DIAGNOSIS — M47816 Spondylosis without myelopathy or radiculopathy, lumbar region: Secondary | ICD-10-CM | POA: Diagnosis not present

## 2021-11-15 ENCOUNTER — Other Ambulatory Visit: Payer: Self-pay

## 2021-11-15 ENCOUNTER — Ambulatory Visit (INDEPENDENT_AMBULATORY_CARE_PROVIDER_SITE_OTHER): Payer: Medicare Other | Admitting: Internal Medicine

## 2021-11-15 ENCOUNTER — Encounter: Payer: Self-pay | Admitting: Internal Medicine

## 2021-11-15 VITALS — BP 136/84 | HR 72 | Temp 98.1°F | Resp 16 | Ht 63.0 in | Wt 243.0 lb

## 2021-11-15 DIAGNOSIS — N184 Chronic kidney disease, stage 4 (severe): Secondary | ICD-10-CM

## 2021-11-15 DIAGNOSIS — E038 Other specified hypothyroidism: Secondary | ICD-10-CM

## 2021-11-15 DIAGNOSIS — I5032 Chronic diastolic (congestive) heart failure: Secondary | ICD-10-CM | POA: Diagnosis not present

## 2021-11-15 DIAGNOSIS — M5136 Other intervertebral disc degeneration, lumbar region: Secondary | ICD-10-CM

## 2021-11-15 DIAGNOSIS — M48062 Spinal stenosis, lumbar region with neurogenic claudication: Secondary | ICD-10-CM | POA: Diagnosis not present

## 2021-11-15 DIAGNOSIS — M170 Bilateral primary osteoarthritis of knee: Secondary | ICD-10-CM

## 2021-11-15 LAB — THYROID PANEL WITH TSH
Free Thyroxine Index: 1.1 — ABNORMAL LOW (ref 1.4–3.8)
T3 Uptake: 30 % (ref 22–35)
T4, Total: 3.5 ug/dL — ABNORMAL LOW (ref 5.1–11.9)
TSH: 0.94 mIU/L (ref 0.40–4.50)

## 2021-11-15 MED ORDER — XTAMPZA ER 13.5 MG PO C12A: 1.0000 | EXTENDED_RELEASE_CAPSULE | Freq: Two times a day (BID) | ORAL | 0 refills | Status: DC | PRN

## 2021-11-15 MED ORDER — EMPAGLIFLOZIN 10 MG PO TABS: 10.0000 mg | ORAL_TABLET | Freq: Every day | ORAL | 1 refills | Status: DC

## 2021-11-15 MED ORDER — OXYCODONE HCL 5 MG PO TABS: 5.0000 mg | ORAL_TABLET | ORAL | 0 refills | Status: DC | PRN

## 2021-11-15 NOTE — Patient Instructions (Signed)
Heart Failure, Diagnosis ?Heart failure is a condition in which the heart has trouble pumping blood. This may mean that the heart cannot pump enough blood out to the body or that the heart does not fill up with enough blood. For some people with heart failure, fluid may back up into the lungs. There may also be swelling (edema) in the lower legs. Heart failure is usually a long-term (chronic) condition. It is important for you to take good care of yourself and follow the treatment plan from your health care provider. ?Different stages of heart failure have different treatment plans. The stages are: ?Stage A: At risk for heart failure. ?Having no symptoms of heart failure, but being at risk for developing heart failure. ?Stage B: Pre-heart failure. ?Having no symptoms of heart failure, but having structural changes to the heart that indicate heart failure. ?Stage C: Symptomatic heart failure. ?Having symptoms of heart failure in addition to structural changes to the heart that indicate heart failure. ?Stage D: Advanced heart failure. ?Having symptoms that interfere with daily life and frequent hospitalizations related to heart failure. ?What are the causes? ?This condition may be caused by: ?High blood pressure (hypertension). Hypertension causes the heart muscle to work harder than normal. ?Coronary artery disease, or CAD. CAD is the buildup of cholesterol and fat (plaque) in the arteries of the heart. ?Heart attack, also called myocardial infarction. This injures the heart muscle, making it hard for the heart to pump blood. ?Abnormal heart valves. The valves do not open and close properly, forcing the heart to pump harder to keep the blood flowing. ?Heart muscle disease, inflammation, or infection (cardiomyopathy or myocarditis). This is damage to the heart muscle. It can increase the risk of heart failure. ?Lung disease. The heart works harder when the lungs are not healthy. ?What increases the risk? ?The risk of  heart failure increases as a person ages. This condition is also more likely to develop in people who: ?Are obese. ?Use tobacco or nicotine products. ?Abuse alcohol or drugs. ?Have taken medicines that can damage the heart, such as chemotherapy drugs. ?Have any of these conditions: ?Diabetes. ?Abnormal heart rhythms. ?Thyroid problems. ?Low blood counts (anemia). ?Chronic kidney disease. ?Have a family history of heart failure. ?What are the signs or symptoms? ?Symptoms of this condition include: ?Shortness of breath with activity, such as when climbing stairs. ?A cough that does not go away. ?Swelling of the feet, ankles, legs, or abdomen. ?Losing or gaining weight for no reason. ?Trouble breathing when lying flat. ?Waking from sleep because of the need to sit up and get more air. ?Rapid heartbeat. ?Other symptoms may include: ?Tiredness (fatigue) and loss of energy. ?Feeling light-headed, dizzy, or close to fainting. ?Nausea or loss of appetite. ?Waking up more often during the night to urinate (nocturia). ?Confusion. ?How is this diagnosed? ?This condition is diagnosed based on: ?Your medical history, symptoms, and a physical exam. ?Blood tests. ?Diagnostic tests, which may include: ?Echocardiogram. ?Electrocardiogram (ECG). ?Chest X-ray. ?Exercise stress test. ?Cardiac MRI. ?Cardiac catheterization and angiogram. ?Radionuclide scans. ?How is this treated? ?Treatment for this condition is aimed at managing the symptoms of heart failure. ?Medicines ?Treatment may include medicines that: ?Help lower blood pressure by relaxing (dilating) the blood vessels. These medicines are called ACE inhibitors (angiotensin-converting enzyme), ARBs (angiotensin receptor blockers), or vasodilators. ?Cause the kidneys to remove salt and water from the blood through urination (diuretics). ?Improve heart muscle strength and prevent the heart from beating too fast (beta blockers). ?Increase the force  of the heartbeat (digoxin). ?Lower  heart rates. ?Certain diabetes medicines (SGLT-2 inhibitors) may also be used in treatment. ?Healthy behavior changes ?Treatment may also include making healthy lifestyle changes, such as: ?Reaching and staying at a healthy weight. ?Not using tobacco or nicotine products. ?Eating heart-healthy foods. ?Limiting or avoiding alcohol. ?Stopping the use of illegal drugs. ?Being physically active. ?Participating in a cardiac rehabilitation program, which is a treatment program to improve your health and well-being through exercise training, education, and counseling. ?Other treatments ?Other treatments may include: ?Procedures to open blocked arteries or repair damaged valves. ?Placing a pacemaker to improve heart function (cardiac resynchronization therapy). ?Placing a device to treat serious abnormal heart rhythms (implantable cardioverter defibrillator, or ICD). ?Placing a device to improve the pumping ability of the heart (left ventricular assist device, or LVAD). ?Receiving a healthy heart from a donor (heart transplant). This is done when other treatments have not helped. ?Follow these instructions at home: ?Manage other health conditions as told by your health care provider. These may include hypertension, diabetes, thyroid disease, or abnormal heart rhythms. ?Get ongoing education and support as needed. Learn as much as you can about heart failure. ?Keep all follow-up visits. This is important. ?Where to find more information ?American Heart Association: www.heart.org ?Centers for Disease Control and Prevention: http://www.wolf.info/ ?[place] on Aging: http://kim-miller.com/ ?Summary ?Heart failure is a condition in which the heart has trouble pumping blood. ?This condition is commonly caused by high blood pressure and other diseases of the heart and lungs. ?Symptoms of this condition include shortness of breath, tiredness (fatigue), nausea, and swelling of the feet, ankles, legs, or abdomen. ?Treatments for this  condition may include medicines, lifestyle changes, and surgery. ?Manage other health conditions as told by your health care provider. ?This information is not intended to replace advice given to you by your health care provider. Make sure you discuss any questions you have with your health care provider. ?Document Revised: 03/02/2021 Document Reviewed: 03/26/2020 ?Elsevier Patient Education ? [place]. ? ?

## 2021-11-15 NOTE — Progress Notes (Signed)
Subjective:  Patient ID: Leslie Edwards, female    DOB: 1946-04-23  Age: 76 y.o. MRN: [id number]  CC: Hypothyroidism and Congestive Heart Failure  This visit occurred during the SARS-CoV-2 public health emergency.  Safety protocols were in place, including screening questions prior to the visit, additional usage of staff PPE, and extensive cleaning of exam room while observing appropriate contact time as indicated for disinfecting solutions.    HPI [name] presents for f/up -  She complains of chronic, unchanged shortness of breath.  She has had some weight gain but denies edema, diaphoresis, chest pain, dizziness, or lightheadedness.  Outpatient Medications Prior to Visit  Medication Sig Dispense Refill   albuterol (PROVENTIL HFA;VENTOLIN HFA) 108 (90 Base) MCG/ACT inhaler Inhale 2 puffs into the lungs every 6 (six) hours as needed. 18 g 11   allopurinol (ZYLOPRIM) 100 MG tablet Take 1 tablet (100 mg total) by mouth daily. 90 tablet 1   Ascorbic Acid (VITAMIN C) 1000 MG tablet Take 1,000 mg by mouth daily.     b complex vitamins tablet Take 1 tablet by mouth daily.     buPROPion (WELLBUTRIN XL) 150 MG 24 hr tablet Take 3 tablets (450 mg total) by mouth daily. 270 tablet 1   famotidine (PEPCID) 10 MG tablet Take 10 mg by mouth 2 (two) times daily as needed for heartburn or indigestion.     Fluticasone-Umeclidin-Vilant (TRELEGY ELLIPTA) 100-62.5-25 MCG/INH AEPB Inhale 1 puff into the lungs daily. 3 each 1   ipratropium-albuterol (DUONEB) 0.5-2.5 (3) MG/3ML SOLN Take 3 mLs by nebulization as needed. 360 mL 1   irbesartan (AVAPRO) 150 MG tablet Take 1 tablet (150 mg total) by mouth daily. 90 tablet 1   liothyronine (CYTOMEL) 25 MCG tablet Take 1 tablet (25 mcg total) by mouth daily. 90 tablet 1   mometasone (NASONEX) 50 MCG/ACT nasal spray Place 2 sprays into the nose as needed. 3 each 1   Multiple Vitamin (MULTIVITAMIN) tablet Take 1 tablet by mouth daily.     omega-3 acid ethyl  esters (LOVAZA) 1 g capsule Take 2 capsules (2 g total) by mouth 2 (two) times daily. 360 capsule 1   potassium chloride (KLOR-CON) 10 MEQ tablet Take 1 tablet (10 mEq total) by mouth daily. 90 tablet 1   pravastatin (PRAVACHOL) 20 MG tablet Take 1 tablet (20 mg total) by mouth daily. 90 tablet 1   pregabalin (LYRICA) 75 MG capsule Take 75 mg by mouth 3 (three) times daily. Takes 2-3 times daily as needed     torsemide (DEMADEX) 20 MG tablet TAKE ONE TABLET BY MOUTH DAILY 90 tablet 1   Vilazodone HCl (VIIBRYD) 40 MG TABS TAKE ONE TABLET ONCE DAILY 30 tablet 5   oxyCODONE (OXY IR/ROXICODONE) 5 MG immediate release tablet Take 1 tablet (5 mg total) by mouth every 4 (four) hours as needed for severe pain. 100 tablet 0   oxyCODONE ER (XTAMPZA ER) 13.5 MG C12A Take 1 tablet by mouth 2 (two) times daily as needed. 60 capsule 0   SYNTHROID 25 MCG tablet TAKE ONE TABLET ONCE DAILY 90 tablet 1   levocetirizine (XYZAL) 5 MG tablet Take 2 tablets (10 mg total) by mouth every evening for 7 days. 14 tablet 0   No facility-administered medications prior to visit.    ROS Review of Systems  Constitutional:  Positive for unexpected weight change. Negative for diaphoresis and fatigue.  HENT: Negative.    Eyes: Negative.   Respiratory:  Positive for  shortness of breath. Negative for cough, chest tightness and wheezing.   Cardiovascular:  Negative for chest pain, palpitations and leg swelling.  Gastrointestinal:  Negative for abdominal pain, diarrhea, nausea and vomiting.  Endocrine: Negative.   Genitourinary: Negative.  Negative for difficulty urinating.  Musculoskeletal:  Positive for arthralgias and back pain. Negative for myalgias.  Skin: Negative.   Neurological:  Negative for dizziness, weakness and light-headedness.  Hematological:  Negative for adenopathy. Does not bruise/bleed easily.  Psychiatric/Behavioral: Negative.     Objective:  BP 136/84 (BP Location: Left Arm, Patient Position: Sitting,  Cuff Size: Large)    Pulse 72    Temp 98.1 F (36.7 C) (Oral)    Resp 16    Ht 5\' 3"  (1.6 m)    Wt 243 lb (110.2 kg)    SpO2 98%    BMI 43.05 kg/m   BP Readings from Last 3 Encounters:  11/15/21 136/84  08/16/21 138/84  05/10/21 (!) 148/76    Wt Readings from Last 3 Encounters:  11/15/21 243 lb (110.2 kg)  08/16/21 243 lb (110.2 kg)  05/10/21 234 lb 3.2 oz (106.2 kg)    Physical Exam Vitals reviewed.  Constitutional:      Appearance: She is not ill-appearing.  HENT:     Mouth/Throat:     Mouth: Mucous membranes are moist.  Eyes:     Conjunctiva/sclera: Conjunctivae normal.  Cardiovascular:     Rate and Rhythm: Normal rate and regular rhythm.     Heart sounds: No murmur heard.   No gallop.  Pulmonary:     Effort: Pulmonary effort is normal.     Breath sounds: No stridor. No wheezing, rhonchi or rales.  Abdominal:     General: Abdomen is protuberant. Bowel sounds are normal. There is no distension.     Palpations: Abdomen is soft. There is no hepatomegaly, splenomegaly or mass.     Tenderness: There is no abdominal tenderness.  Musculoskeletal:        General: Normal range of motion.     Cervical back: Neck supple.     Right lower leg: No edema.     Left lower leg: No edema.  Lymphadenopathy:     Cervical: No cervical adenopathy.  Skin:    General: Skin is warm.  Neurological:     General: No focal deficit present.     Mental Status: She is alert.  Psychiatric:        Mood and Affect: Mood normal.        Behavior: Behavior normal.    Lab Results  Component Value Date   WBC 6.8 05/10/2021   HGB 12.2 05/10/2021   HCT 36.4 05/10/2021   PLT 231.0 05/10/2021   GLUCOSE 98 08/16/2021   CHOL 198 11/09/2020   TRIG 166.0 (H) 11/09/2020   HDL 91.80 11/09/2020   LDLDIRECT 107.0 10/14/2019   LDLCALC 73 11/09/2020   ALT 32 11/09/2020   AST 25 11/09/2020   NA 141 08/16/2021   K 4.8 08/16/2021   CL 108 08/16/2021   CREATININE 1.65 (H) 08/16/2021   BUN 30 (H)  08/16/2021   CO2 26 08/16/2021   TSH 0.94 11/15/2021   HGBA1C 5.3 04/28/2009    MR LUMBAR SPINE WO CONTRAST  Result Date: 04/03/2021 CLINICAL DATA:  Low back pain radiating to the left leg primarily. Occasional symptoms on the right. Numbness of the left leg. EXAM: MRI LUMBAR SPINE WITHOUT CONTRAST TECHNIQUE: Multiplanar, multisequence MR imaging of the lumbar spine was performed.  No intravenous contrast was administered. COMPARISON:  Radiography 10/14/2020.  MRI 06/14/2014. FINDINGS: Segmentation:  5 lumbar type vertebral bodies. Alignment: Chronic scoliotic curvature convex to the left. Chronic degenerative anterolisthesis at L4-5, measuring 7 mm today, increased 2 mm since 2015. Vertebrae: No fracture or focal bone lesion. Discogenic endplate marrow changes at T11-12 which are newly seen and could relate to regional pain. Conus medullaris and cauda equina: Conus extends to the L1 level. Conus and cauda equina appear normal. Paraspinal and other soft tissues: Negative Disc levels: T11-12: Disc degeneration with mild bulging of the disc. No compressive stenosis. Facet osteoarthritis. Discogenic endplate marrow changes as noted above, new since 2015, which could relate to regional pain. T12-L1: Normal interspace. L1-2: 2 mm retrolisthesis. Bulging of the disc with slight caudal down turning. Mild narrowing of the lateral recesses but no likely neural compression. Similar appearance the study of 2015. L2-3: Disc degeneration with endplate osteophytes and bulging of the disc. Mild facet and ligamentous hypertrophy. Mild narrowing of the lateral recesses and foramina. Similar appearance to the study of 2015. No definite neural compression. L3-4: Endplate osteophytes and bulging of the disc, with a small left foraminal disc herniation. Facet and ligamentous hypertrophy. Mild narrowing of the lateral recesses. Small left foraminal disc herniation likely to affect the left L3 nerve. The small left foraminal disc  is new since 2015. L4-5: Chronic facet arthropathy, now with 7 mm of anterolisthesis, increased 2 mm since 2015. Endplate osteophytes and bulging of the disc. Moderate multifactorial stenosis at this level, left worse than right, that could cause neural compression on either or both sides. This includes left foraminal stenosis that could affect the exiting L4 nerve. The facet joints are gaping and fluid-filled, in this appearance could worsen with standing or flexion. L5-S1: Minimal bulging of the disc. Facet arthropathy with gaping, fluid-filled joints. No compressive stenosis of the canal. Left foraminal narrowing because of encroachment by osteophyte and bulging disc material could affect the left L5 nerve. This appearance could worsen with standing or flexion. The facet arthropathy and the left foraminal stenosis have worsened since 2015. IMPRESSION: T11-12: Degenerative disc disease with discogenic edema the endplates, newly seen since 2015. This could relate to regional pain. L3-4: Newly seen small foraminal disc herniation on the left at L3-4 could focally affect the left L3 nerve. L4-5: Facet arthropathy with worsened anterolisthesis, now measuring 7 mm, increased 2 mm since 2015. Multifactorial stenosis at this level that could cause neural compression on either or both sides. Foraminal stenosis on the left could affect the exiting L4 nerve. The facet joints are gaping and fluid-filled in the appearance could worsen with standing or flexion. L5-S1: Worsening of facet arthropathy with gaping, fluid-filled joints. Foraminal stenosis on the left that could affect the exiting left L5 nerve. The appearance could worsen with standing or flexion. This appearance has worsened since 2015. Electronically Signed   By: Nelson Chimes M.D.   On: 04/03/2021 11:18    Assessment & Plan:   Amalia was seen today for hypothyroidism and congestive heart failure.  Diagnoses and all orders for this visit:  Diastolic  dysfunction with chronic heart failure (HCC) -     empagliflozin (JARDIANCE) 10 MG TABS tablet; Take 1 tablet (10 mg total) by mouth daily before breakfast. -     Thyroid Panel With TSH; Future -     Thyroid Panel With TSH  Spinal stenosis, lumbar region, with neurogenic claudication -     Discontinue: oxyCODONE ER (XTAMPZA  ER) 13.5 MG C12A; Take 1 tablet by mouth 2 (two) times daily as needed. -     oxyCODONE (OXY IR/ROXICODONE) 5 MG immediate release tablet; Take 1 tablet (5 mg total) by mouth every 4 (four) hours as needed for severe pain.  Other specified hypothyroidism- Her TSH is in the normal range.  She will stay on the current T3 dosage. -     Thyroid Panel With TSH; Future -     Thyroid Panel With TSH  Kidney disease, chronic, stage IV (GFR 15-29 ml/min) (HCC)-her renal function is stable.  DDD (degenerative disc disease), lumbar -     oxyCODONE (OXY IR/ROXICODONE) 5 MG immediate release tablet; Take 1 tablet (5 mg total) by mouth every 4 (four) hours as needed for severe pain.  Primary osteoarthritis of both knees -     oxyCODONE (OXY IR/ROXICODONE) 5 MG immediate release tablet; Take 1 tablet (5 mg total) by mouth every 4 (four) hours as needed for severe pain.   I have discontinued Tamakia H. [employer]'s Xtampza ER, Synthroid, and Xtampza ER. I am also having her start on empagliflozin. Additionally, I am having her maintain her albuterol, levocetirizine, vitamin C, multivitamin, b complex vitamins, famotidine, Trelegy Ellipta, ipratropium-albuterol, mometasone, omega-3 acid ethyl esters, pregabalin, Vilazodone HCl, allopurinol, buPROPion, irbesartan, liothyronine, potassium chloride, pravastatin, torsemide, and oxyCODONE.  Meds ordered this encounter  Medications   DISCONTD: oxyCODONE ER (XTAMPZA ER) 13.5 MG C12A    Sig: Take 1 tablet by mouth 2 (two) times daily as needed.    Dispense:  60 capsule    Refill:  0   empagliflozin (JARDIANCE) 10 MG TABS tablet    Sig: Take 1  tablet (10 mg total) by mouth daily before breakfast.    Dispense:  90 tablet    Refill:  1   oxyCODONE (OXY IR/ROXICODONE) 5 MG immediate release tablet    Sig: Take 1 tablet (5 mg total) by mouth every 4 (four) hours as needed for severe pain.    Dispense:  100 tablet    Refill:  0     Follow-up: Return in about 3 months (around 02/15/2022).  Scarlette Calico, MD

## 2021-11-19 ENCOUNTER — Other Ambulatory Visit: Payer: Self-pay

## 2021-11-19 ENCOUNTER — Ambulatory Visit
Admission: RE | Admit: 2021-11-19 | Discharge: 2021-11-19 | Disposition: A | Discharge status: Home / Self Care | Payer: Medicare Other | Source: Ambulatory Visit | Attending: Rehabilitation | Admitting: Rehabilitation

## 2021-11-19 DIAGNOSIS — M5136 Other intervertebral disc degeneration, lumbar region: Secondary | ICD-10-CM | POA: Diagnosis not present

## 2021-11-19 DIAGNOSIS — M545 Low back pain, unspecified: Secondary | ICD-10-CM | POA: Diagnosis not present

## 2021-11-19 DIAGNOSIS — M5416 Radiculopathy, lumbar region: Secondary | ICD-10-CM | POA: Diagnosis not present

## 2021-11-19 DIAGNOSIS — M48061 Spinal stenosis, lumbar region without neurogenic claudication: Secondary | ICD-10-CM | POA: Diagnosis not present

## 2021-11-23 DIAGNOSIS — M47816 Spondylosis without myelopathy or radiculopathy, lumbar region: Secondary | ICD-10-CM | POA: Diagnosis not present

## 2021-11-23 DIAGNOSIS — M5416 Radiculopathy, lumbar region: Secondary | ICD-10-CM | POA: Diagnosis not present

## 2021-12-14 DIAGNOSIS — M5459 Other low back pain: Secondary | ICD-10-CM | POA: Diagnosis not present

## 2021-12-14 DIAGNOSIS — M47816 Spondylosis without myelopathy or radiculopathy, lumbar region: Secondary | ICD-10-CM | POA: Diagnosis not present

## 2021-12-18 ENCOUNTER — Ambulatory Visit (INDEPENDENT_AMBULATORY_CARE_PROVIDER_SITE_OTHER): Payer: Medicare Other

## 2021-12-18 DIAGNOSIS — Z0000 Encounter for general adult medical examination without abnormal findings: Secondary | ICD-10-CM

## 2021-12-18 NOTE — Patient Instructions (Signed)
Leslie Edwards , ?Thank you for taking time to come for your Medicare Wellness Visit. I appreciate your ongoing commitment to your health goals. Please review the following plan we discussed and let me know if I can assist you in the future.  ? ?Screening recommendations/referrals: ?Colonoscopy: no longer required  ?Mammogram: no longer required  ?Bone Density: 11/04/2015 ?Recommended yearly ophthalmology/optometry visit for glaucoma screening and checkup ?Recommended yearly dental visit for hygiene and checkup ? ?Vaccinations: ?Influenza vaccine: completed  ?Pneumococcal vaccine: completed  ?Tdap vaccine: 07/04/2012 ?Shingles vaccine: completed  ? ?Advanced directives ? ?Conditions/risks identified: yes  ? ?Next appointment: none  ? ? ?Preventive Care 77 Years and Older, Female ?Preventive care refers to lifestyle choices and visits with your health care provider that can promote health and wellness. ?What does preventive care include? ?A yearly physical exam. This is also called an annual well check. ?Dental exams once or twice a year. ?Routine eye exams. Ask your health care provider how often you should have your eyes checked. ?Personal lifestyle choices, including: ?Daily care of your teeth and gums. ?Regular physical activity. ?Eating a healthy diet. ?Avoiding tobacco and drug use. ?Limiting alcohol use. ?Practicing safe sex. ?Taking low-dose aspirin every day. ?Taking vitamin and mineral supplements as recommended by your health care provider. ?What happens during an annual well check? ?The services and screenings done by your health care provider during your annual well check will depend on your age, overall health, lifestyle risk factors, and family history of disease. ?Counseling  ?Your health care provider may ask you questions about your: ?Alcohol use. ?Tobacco use. ?Drug use. ?Emotional well-being. ?Home and relationship well-being. ?Sexual activity. ?Eating habits. ?History of falls. ?Memory and ability to  understand (cognition). ?Work and work Statistician. ?Reproductive health. ?Screening  ?You may have the following tests or measurements: ?Height, weight, and BMI. ?Blood pressure. ?Lipid and cholesterol levels. These may be checked every 5 years, or more frequently if you are over 85 years old. ?Skin check. ?Lung cancer screening. You may have this screening every year starting at age 32 if you have a 30-pack-year history of smoking and currently smoke or have quit within the past 15 years. ?Fecal occult blood test (FOBT) of the stool. You may have this test every year starting at age 9. ?Flexible sigmoidoscopy or colonoscopy. You may have a sigmoidoscopy every 5 years or a colonoscopy every 10 years starting at age 26. ?Hepatitis C blood test. ?Hepatitis B blood test. ?Sexually transmitted disease (STD) testing. ?Diabetes screening. This is done by checking your blood sugar (glucose) after you have not eaten for a while (fasting). You may have this done every 1-3 years. ?Bone density scan. This is done to screen for osteoporosis. You may have this done starting at age 40. ?Mammogram. This may be done every 1-2 years. Talk to your health care provider about how often you should have regular mammograms. ?Talk with your health care provider about your test results, treatment options, and if necessary, the need for more tests. ?Vaccines  ?Your health care provider may recommend certain vaccines, such as: ?Influenza vaccine. This is recommended every year. ?Tetanus, diphtheria, and acellular pertussis (Tdap, Td) vaccine. You may need a Td booster every 10 years. ?Zoster vaccine. You may need this after age 71. ?Pneumococcal 13-valent conjugate (PCV13) vaccine. One dose is recommended after age 28. ?Pneumococcal polysaccharide (PPSV23) vaccine. One dose is recommended after age 16. ?Talk to your health care provider about which screenings and vaccines you need and how  often you need them. ?This information is not  intended to replace advice given to you by your health care provider. Make sure you discuss any questions you have with your health care provider. ?Document Released: 09/30/2015 Document Revised: 05/23/2016 Document Reviewed: 07/05/2015 ?Elsevier Interactive Patient Education ? 2017 [place]. ? ?Fall Prevention in the Home ?Falls can cause injuries. They can happen to people of all ages. There are many things you can do to make your home safe and to help prevent falls. ?What can I do on the outside of my home? ?Regularly fix the edges of walkways and driveways and fix any cracks. ?Remove anything that might make you trip as you walk through a door, such as a raised step or threshold. ?Trim any bushes or trees on the path to your home. ?Use bright outdoor lighting. ?Clear any walking paths of anything that might make someone trip, such as rocks or tools. ?Regularly check to see if handrails are loose or broken. Make sure that both sides of any steps have handrails. ?Any raised decks and porches should have guardrails on the edges. ?Have any leaves, snow, or ice cleared regularly. ?Use sand or salt on walking paths during winter. ?Clean up any spills in your garage right away. This includes oil or grease spills. ?What can I do in the bathroom? ?Use night lights. ?Install grab bars by the toilet and in the tub and shower. Do not use towel bars as grab bars. ?Use non-skid mats or decals in the tub or shower. ?If you need to sit down in the shower, use a plastic, non-slip stool. ?Keep the floor dry. Clean up any water that spills on the floor as soon as it happens. ?Remove soap buildup in the tub or shower regularly. ?Attach bath mats securely with double-sided non-slip rug tape. ?Do not have throw rugs and other things on the floor that can make you trip. ?What can I do in the bedroom? ?Use night lights. ?Make sure that you have a light by your bed that is easy to reach. ?Do not use any sheets or blankets that are  too big for your bed. They should not hang down onto the floor. ?Have a firm chair that has side arms. You can use this for support while you get dressed. ?Do not have throw rugs and other things on the floor that can make you trip. ?What can I do in the kitchen? ?Clean up any spills right away. ?Avoid walking on wet floors. ?Keep items that you use a lot in easy-to-reach places. ?If you need to reach something above you, use a strong step stool that has a grab bar. ?Keep electrical cords out of the way. ?Do not use floor polish or wax that makes floors slippery. If you must use wax, use non-skid floor wax. ?Do not have throw rugs and other things on the floor that can make you trip. ?What can I do with my stairs? ?Do not leave any items on the stairs. ?Make sure that there are handrails on both sides of the stairs and use them. Fix handrails that are broken or loose. Make sure that handrails are as long as the stairways. ?Check any carpeting to make sure that it is firmly attached to the stairs. Fix any carpet that is loose or worn. ?Avoid having throw rugs at the top or bottom of the stairs. If you do have throw rugs, attach them to the floor with carpet tape. ?Make sure that you have a  light switch at the top of the stairs and the bottom of the stairs. If you do not have them, ask someone to add them for you. ?What else can I do to help prevent falls? ?Wear shoes that: ?Do not have high heels. ?Have rubber bottoms. ?Are comfortable and fit you well. ?Are closed at the toe. Do not wear sandals. ?If you use a stepladder: ?Make sure that it is fully opened. Do not climb a closed stepladder. ?Make sure that both sides of the stepladder are locked into place. ?Ask someone to hold it for you, if possible. ?Clearly mark and make sure that you can see: ?Any grab bars or handrails. ?First and last steps. ?Where the edge of each step is. ?Use tools that help you move around (mobility aids) if they are needed. These  include: ?Canes. ?Walkers. ?Scooters. ?Crutches. ?Turn on the lights when you go into a dark area. Replace any light bulbs as soon as they burn out. ?Set up your furniture so you have a clear path. Avoid moving you

## 2021-12-18 NOTE — Progress Notes (Signed)
? ?Subjective:  ? Leslie Edwards is a 76 y.o. female who presents for Medicare Annual (Subsequent) preventive examination. ? ?I connected with [employer] today by telephone and verified that I am speaking with the correct person using two identifiers. ?Location patient: home ?Location provider: work ?Persons participating in the virtual visit: patient, provider. ?  ?I discussed the limitations, risks, security and privacy concerns of performing an evaluation and management service by telephone and the availability of in person appointments. I also discussed with the patient that there may be a patient responsible charge related to this service. The patient expressed understanding and verbally consented to this telephonic visit.  ?  ?Interactive audio and video telecommunications were attempted between this provider and patient, however failed, due to patient having technical difficulties OR patient did not have access to video capability.  We continued and completed visit with audio only. ? ?  ?Review of Systems    ? ?Cardiac Risk Factors include: advanced age (>78mn, >>32women) ? ?   ?Objective:  ?  ?Today's Vitals  ? ?There is no height or weight on file to calculate BMI. ? ? ?  12/18/2021  ?  2:41 PM 12/22/2020  ? 11:31 AM 05/01/2018  ?  5:28 PM  ?Advanced Directives  ?Does Patient Have a Medical Advance Directive? Yes Yes Yes  ?Type of [place]amed[place] will  ?Copy of HMul[place]in Chart? No - copy requested  No - copy requested  ? ? ?Current Medications (verified) ?Outpatient Encounter Medications as of 12/18/2021  ?Medication Sig  ? albuterol (PROVENTIL HFA;VENTOLIN HFA) 108 (90 Base) MCG/ACT inhaler Inhale 2 puffs into the lungs every 6 (six) hours as needed.  ? allopurinol (ZYLOPRIM) 100 MG tablet Take 1 tablet (100 mg total) by mouth daily.  ? Ascorbic Acid (VITAMIN C) 1000 MG tablet Take 1,000 mg by mouth daily.  ? b complex  vitamins tablet Take 1 tablet by mouth daily.  ? buPROPion (WELLBUTRIN XL) 150 MG 24 hr tablet Take 3 tablets (450 mg total) by mouth daily.  ? empagliflozin (JARDIANCE) 10 MG TABS tablet Take 1 tablet (10 mg total) by mouth daily before breakfast.  ? famotidine (PEPCID) 10 MG tablet Take 10 mg by mouth 2 (two) times daily as needed for heartburn or indigestion.  ? Fluticasone-Umeclidin-Vilant (TRELEGY ELLIPTA) 100-62.5-25 MCG/INH AEPB Inhale 1 puff into the lungs daily.  ? ipratropium-albuterol (DUONEB) 0.5-2.5 (3) MG/3ML SOLN Take 3 mLs by nebulization as needed.  ? irbesartan (AVAPRO) 150 MG tablet Take 1 tablet (150 mg total) by mouth daily.  ? liothyronine (CYTOMEL) 25 MCG tablet Take 1 tablet (25 mcg total) by mouth daily.  ? mometasone (NASONEX) 50 MCG/ACT nasal spray Place 2 sprays into the nose as needed.  ? Multiple Vitamin (MULTIVITAMIN) tablet Take 1 tablet by mouth daily.  ? omega-3 acid ethyl esters (LOVAZA) 1 g capsule Take 2 capsules (2 g total) by mouth 2 (two) times daily.  ? oxyCODONE (OXY IR/ROXICODONE) 5 MG immediate release tablet Take 1 tablet (5 mg total) by mouth every 4 (four) hours as needed for severe pain.  ? potassium chloride (KLOR-CON) 10 MEQ tablet Take 1 tablet (10 mEq total) by mouth daily.  ? pravastatin (PRAVACHOL) 20 MG tablet Take 1 tablet (20 mg total) by mouth daily.  ? pregabalin (LYRICA) 75 MG capsule Take 75 mg by mouth 3 (three) times daily. Takes 2-3 times daily as needed  ?  torsemide (DEMADEX) 20 MG tablet TAKE ONE TABLET BY MOUTH DAILY  ? Vilazodone HCl (VIIBRYD) 40 MG TABS TAKE ONE TABLET ONCE DAILY  ? levocetirizine (XYZAL) 5 MG tablet Take 2 tablets (10 mg total) by mouth every evening for 7 days.  ? [DISCONTINUED] K-DUR 10 MEQ tablet TAKE 1 TABLET DAILY.  ? ?No facility-administered encounter medications on file as of 12/18/2021.  ? ? ?Allergies (verified) ?Penicillins and Prochlorperazine edisylate  ? ?History: ?Past Medical History:  ?Diagnosis Date  ? Allergy   ?  ANEMIA, B12 DEFICIENCY 01/28/2009  ? Qualifier: Diagnosis of  By: Arnoldo Morale MD, Balinda Quails   ? Anxiety   ? Asthma   ? GERD (gastroesophageal reflux disease)   ? Hypertension   ? Hypothyroidism   ? OSTEOPOROSIS 05/06/2007  ? Qualifier: Diagnosis of  By: Arnoldo Morale MD, Balinda Quails   ? 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 Secondary to low dietary calcium/calcium malabsorption   ? PLANTAR FASCIITIS, LEFT 07/05/2009  ? Qualifier: Diagnosis of  By: Arnoldo Morale MD, Balinda Quails   ? Rosacea 02/25/2009  ? Qualifier: Diagnosis of  By: Arnoldo Morale MD, Balinda Quails   ? Sprue   ? UNSPECIFIED IRON DEFICIENCY ANEMIA 01/28/2009  ? Qualifier: Diagnosis of  By: Arnoldo Morale MD, John E   ? VENOUS INSUFFICIENCY, CHRONIC 04/28/2007  ? Qualifier: Diagnosis of  By: Scherrie Gerlach    ? ?Past Surgical History:  ?Procedure Laterality Date  ? BREAST BIOPSY Left   ? BREAST EXCISIONAL BIOPSY Right   ? CARPAL TUNNEL RELEASE    ? KNEE ARTHROSCOPY    ? right  ? [place]  ? axilary  ? nissan fundiplication    ? TONSILLECTOMY    ? ?Family History  ?Problem Relation Age of Onset  ? Cancer Mother   ?     lung  ? Macular degeneration Father   ? Arthritis Father   ? Lymphoma Son   ?     died age 69  ? Dementia Maternal Grandmother   ? Stroke Maternal Grandmother   ? Healthy Brother   ? Cancer Maternal Grandfather   ?     esophageal cancer  ? Cancer Paternal Grandmother   ?     leukemia  ? Healthy Brother   ? Cancer Maternal Uncle   ?     colon  ? Cancer Maternal Aunt   ?     esophageal cancer  ? ?Social History  ? ?Socioeconomic History  ? Marital status: Married  ?  Spouse name: Not on file  ? Number of children: 1  ? Years of education: Not on file  ? Highest education level: Not on file  ?Occupational History  ? Not on file  ?Tobacco Use  ? Smoking status: Former  ?  Types: Cigarettes  ?  Quit date: 01/22/1975  ?  Years since quitting: 46.9  ? Smokeless tobacco: Never  ?Vaping Use  ? Vaping Use: Never used  ?Substance and Sexual Activity  ? Alcohol use: Yes  ?   Alcohol/week: 3.0 standard drinks  ?  Types: 3 Glasses of wine per week  ? Drug use: No  ? Sexual activity: Not Currently  ?Other Topics Concern  ? Not on file  ?Social History Narrative  ? Regular exercise: Not walking  ? Works 3 days per week at the [employer]  ? Caffeine use: yes  ? ?Social Determinants of Health  ? ?Financial Resource Strain: Low Risk   ?  Difficulty of Paying Living Expenses: Not hard at all  ?Food Insecurity: No Food Insecurity  ? Worried About Charity fundraiser in the Last Year: Never true  ? Ran Out of Food in the Last Year: Never true  ?Transportation Needs: No Transportation Needs  ? Lack of Transportation (Medical): No  ? Lack of Transportation (Non-Medical): No  ?Physical Activity: Inactive  ? Days of Exercise per Week: 0 days  ? Minutes of Exercise per Session: 0 min  ?Stress: No Stress Concern Present  ? Feeling of Stress : Not at all  ?Social Connections: Moderately Isolated  ? Frequency of Communication with Friends and Family: Twice a week  ? Frequency of Social Gatherings with Friends and Family: Twice a week  ? Attends Religious Services: Never  ? Active Member of Clubs or Organizations: No  ? Attends Archivist Meetings: Never  ? Marital Status: Married  ? ? ?Tobacco Counseling ?Counseling given: Not Answered ? ? ?Clinical Intake: ? ?Pre-visit preparation completed: Yes ? ?Pain : No/denies pain ? ?  ? ?Nutritional Risks: None ?Diabetes: No ? ?How often do you need to have someone help you when you read instructions, pamphlets, or other written materials from your doctor or pharmacy?: 1 - Never ?What is the last grade level you completed in school?: masters ? ?Diabetic?no  ? ?Interpreter Needed?: No ? ?Information entered by :: [email or web address] ? ? ?Activities of Daily Living ? ?  12/18/2021  ?  2:45 PM  ?In your present state of health, do you have any difficulty performing the following activities:  ?Hearing? 0  ?Vision? 0  ?Difficulty concentrating or  making decisions? 0  ?Walking or climbing stairs? 0  ?Dressing or bathing? 0  ?Doing errands, shopping? 0  ?Preparing Food and eating ? N  ?Using the Toilet? N  ?In the past six months, have you accidently leak

## 2021-12-21 DIAGNOSIS — M47816 Spondylosis without myelopathy or radiculopathy, lumbar region: Secondary | ICD-10-CM | POA: Diagnosis not present

## 2021-12-21 DIAGNOSIS — M5459 Other low back pain: Secondary | ICD-10-CM | POA: Diagnosis not present

## 2022-01-06 ENCOUNTER — Other Ambulatory Visit: Payer: Self-pay | Admitting: Internal Medicine

## 2022-01-06 DIAGNOSIS — I10 Essential (primary) hypertension: Secondary | ICD-10-CM

## 2022-01-09 ENCOUNTER — Other Ambulatory Visit: Payer: Self-pay | Admitting: Internal Medicine

## 2022-01-09 DIAGNOSIS — F418 Other specified anxiety disorders: Secondary | ICD-10-CM

## 2022-01-17 DIAGNOSIS — M5459 Other low back pain: Secondary | ICD-10-CM | POA: Diagnosis not present

## 2022-01-17 DIAGNOSIS — M47816 Spondylosis without myelopathy or radiculopathy, lumbar region: Secondary | ICD-10-CM | POA: Diagnosis not present

## 2022-01-18 ENCOUNTER — Encounter: Payer: Self-pay | Admitting: Internal Medicine

## 2022-01-18 ENCOUNTER — Other Ambulatory Visit: Payer: Self-pay | Admitting: Internal Medicine

## 2022-01-18 DIAGNOSIS — M48062 Spinal stenosis, lumbar region with neurogenic claudication: Secondary | ICD-10-CM

## 2022-01-18 DIAGNOSIS — M51369 Other intervertebral disc degeneration, lumbar region without mention of lumbar back pain or lower extremity pain: Secondary | ICD-10-CM

## 2022-01-18 DIAGNOSIS — M170 Bilateral primary osteoarthritis of knee: Secondary | ICD-10-CM

## 2022-01-18 DIAGNOSIS — M5136 Other intervertebral disc degeneration, lumbar region: Secondary | ICD-10-CM

## 2022-01-18 MED ORDER — OXYCODONE HCL 5 MG PO TABS: 5.0000 mg | ORAL_TABLET | ORAL | 0 refills | Status: DC | PRN

## 2022-01-24 DIAGNOSIS — M5459 Other low back pain: Secondary | ICD-10-CM | POA: Diagnosis not present

## 2022-01-24 DIAGNOSIS — M47816 Spondylosis without myelopathy or radiculopathy, lumbar region: Secondary | ICD-10-CM | POA: Diagnosis not present

## 2022-02-01 DIAGNOSIS — M47816 Spondylosis without myelopathy or radiculopathy, lumbar region: Secondary | ICD-10-CM | POA: Diagnosis not present

## 2022-02-01 DIAGNOSIS — M5459 Other low back pain: Secondary | ICD-10-CM | POA: Diagnosis not present

## 2022-02-05 DIAGNOSIS — M5416 Radiculopathy, lumbar region: Secondary | ICD-10-CM | POA: Diagnosis not present

## 2022-02-06 DIAGNOSIS — M47816 Spondylosis without myelopathy or radiculopathy, lumbar region: Secondary | ICD-10-CM | POA: Diagnosis not present

## 2022-02-06 DIAGNOSIS — M5459 Other low back pain: Secondary | ICD-10-CM | POA: Diagnosis not present

## 2022-02-14 DIAGNOSIS — M47816 Spondylosis without myelopathy or radiculopathy, lumbar region: Secondary | ICD-10-CM | POA: Diagnosis not present

## 2022-02-14 DIAGNOSIS — M5459 Other low back pain: Secondary | ICD-10-CM | POA: Diagnosis not present

## 2022-02-15 ENCOUNTER — Ambulatory Visit (INDEPENDENT_AMBULATORY_CARE_PROVIDER_SITE_OTHER): Payer: Medicare Other | Admitting: Internal Medicine

## 2022-02-15 ENCOUNTER — Encounter: Payer: Self-pay | Admitting: Internal Medicine

## 2022-02-15 VITALS — BP 140/80 | HR 75 | Temp 98.3°F | Ht 63.0 in | Wt 246.0 lb

## 2022-02-15 DIAGNOSIS — I10 Essential (primary) hypertension: Secondary | ICD-10-CM

## 2022-02-15 DIAGNOSIS — E781 Pure hyperglyceridemia: Secondary | ICD-10-CM

## 2022-02-15 DIAGNOSIS — E038 Other specified hypothyroidism: Secondary | ICD-10-CM

## 2022-02-15 DIAGNOSIS — E213 Hyperparathyroidism, unspecified: Secondary | ICD-10-CM

## 2022-02-15 DIAGNOSIS — N184 Chronic kidney disease, stage 4 (severe): Secondary | ICD-10-CM | POA: Diagnosis not present

## 2022-02-15 DIAGNOSIS — J4520 Mild intermittent asthma, uncomplicated: Secondary | ICD-10-CM

## 2022-02-15 DIAGNOSIS — E2839 Other primary ovarian failure: Secondary | ICD-10-CM

## 2022-02-15 LAB — CBC WITH DIFFERENTIAL/PLATELET
Basophils Absolute: 0.1 10*3/uL (ref 0.0–0.1)
Basophils Relative: 1 % (ref 0.0–3.0)
Eosinophils Absolute: 0.3 10*3/uL (ref 0.0–0.7)
Eosinophils Relative: 4.6 % (ref 0.0–5.0)
HCT: 37.3 % (ref 36.0–46.0)
Hemoglobin: 12.6 g/dL (ref 12.0–15.0)
Lymphocytes Relative: 27.1 % (ref 12.0–46.0)
Lymphs Abs: 1.7 10*3/uL (ref 0.7–4.0)
MCHC: 33.7 g/dL (ref 30.0–36.0)
MCV: 98.1 fl (ref 78.0–100.0)
Monocytes Absolute: 0.7 10*3/uL (ref 0.1–1.0)
Monocytes Relative: 10.9 % (ref 3.0–12.0)
Neutro Abs: 3.4 10*3/uL (ref 1.4–7.7)
Neutrophils Relative %: 56.4 % (ref 43.0–77.0)
Platelets: 217 10*3/uL (ref 150.0–400.0)
RBC: 3.81 Mil/uL — ABNORMAL LOW (ref 3.87–5.11)
RDW: 13.4 % (ref 11.5–15.5)
WBC: 6.1 10*3/uL (ref 4.0–10.5)

## 2022-02-15 LAB — BASIC METABOLIC PANEL
BUN: 35 mg/dL — ABNORMAL HIGH (ref 6–23)
CO2: 26 mEq/L (ref 19–32)
Calcium: 9.9 mg/dL (ref 8.4–10.5)
Chloride: 105 mEq/L (ref 96–112)
Creatinine, Ser: 1.93 mg/dL — ABNORMAL HIGH (ref 0.40–1.20)
GFR: 24.76 mL/min — ABNORMAL LOW (ref >60.00)
Glucose, Bld: 98 mg/dL (ref 70–99)
Potassium: 4.8 mEq/L (ref 3.5–5.1)
Sodium: 140 mEq/L (ref 135–145)

## 2022-02-15 LAB — LIPID PANEL
Cholesterol: 174 mg/dL (ref 0–200)
HDL: 75 mg/dL (ref >39.00)
LDL Cholesterol: 71 mg/dL (ref 0–99)
NonHDL: 99.23
Total CHOL/HDL Ratio: 2
Triglycerides: 140 mg/dL (ref 0.0–149.0)
VLDL: 28 mg/dL (ref 0.0–40.0)

## 2022-02-15 MED ORDER — FLUTICASONE-UMECLIDIN-VILANT 100-62.5-25 MCG/ACT IN AEPB: 1.0000 | INHALATION_SPRAY | Freq: Every day | RESPIRATORY_TRACT | 1 refills | Status: DC

## 2022-02-15 NOTE — Progress Notes (Unsigned)
Subjective:  Patient ID: Leslie Edwards, female    DOB: [birth year]  Age: 76 y.o. MRN: [id number]  CC: No chief complaint on file.   HPI [name] presents for ***  Outpatient Medications Prior to Visit  Medication Sig Dispense Refill   albuterol (PROVENTIL HFA;VENTOLIN HFA) 108 (90 Base) MCG/ACT inhaler Inhale 2 puffs into the lungs every 6 (six) hours as needed. 18 g 11   allopurinol (ZYLOPRIM) 100 MG tablet Take 1 tablet (100 mg total) by mouth daily. 90 tablet 1   Ascorbic Acid (VITAMIN C) 1000 MG tablet Take 1,000 mg by mouth daily.     b complex vitamins tablet Take 1 tablet by mouth daily.     buPROPion (WELLBUTRIN XL) 150 MG 24 hr tablet Take 3 tablets (450 mg total) by mouth daily. 270 tablet 1   empagliflozin (JARDIANCE) 10 MG TABS tablet Take 1 tablet (10 mg total) by mouth daily before breakfast. 90 tablet 1   famotidine (PEPCID) 10 MG tablet Take 10 mg by mouth 2 (two) times daily as needed for heartburn or indigestion.     ipratropium-albuterol (DUONEB) 0.5-2.5 (3) MG/3ML SOLN Take 3 mLs by nebulization as needed. 360 mL 1   irbesartan (AVAPRO) 150 MG tablet Take 1 tablet (150 mg total) by mouth daily. 90 tablet 1   liothyronine (CYTOMEL) 25 MCG tablet Take 1 tablet (25 mcg total) by mouth daily. 90 tablet 1   mometasone (NASONEX) 50 MCG/ACT nasal spray Place 2 sprays into the nose as needed. 3 each 1   Multiple Vitamin (MULTIVITAMIN) tablet Take 1 tablet by mouth daily.     omega-3 acid ethyl esters (LOVAZA) 1 g capsule Take 2 capsules (2 g total) by mouth 2 (two) times daily. 360 capsule 1   oxyCODONE (OXY IR/ROXICODONE) 5 MG immediate release tablet Take 1 tablet (5 mg total) by mouth every 4 (four) hours as needed for severe pain. 100 tablet 0   potassium chloride (KLOR-CON) 10 MEQ tablet Take 1 tablet (10 mEq total) by mouth daily. 90 tablet 1   pravastatin (PRAVACHOL) 20 MG tablet Take 1 tablet (20 mg total) by mouth daily. 90 tablet 1   pregabalin (LYRICA) 75  MG capsule Take 75 mg by mouth 3 (three) times daily. Takes 2-3 times daily as needed     torsemide (DEMADEX) 20 MG tablet TAKE ONE TABLET BY MOUTH DAILY 90 tablet 1   Vilazodone HCl (VIIBRYD) 40 MG TABS TAKE ONE TABLET ONCE DAILY 30 tablet 5   Fluticasone-Umeclidin-Vilant (TRELEGY ELLIPTA) 100-62.5-25 MCG/INH AEPB Inhale 1 puff into the lungs daily. 3 each 1   levocetirizine (XYZAL) 5 MG tablet Take 2 tablets (10 mg total) by mouth every evening for 7 days. 14 tablet 0   No facility-administered medications prior to visit.    ROS Review of Systems  Objective:  BP 140/80 (BP Location: Right Arm, Patient Position: Sitting, Cuff Size: Large)   Pulse 75   Temp 98.3 F (36.8 C) (Oral)   Ht '5\' 3"'$  (1.6 m)   Wt 246 lb (111.6 kg)   SpO2 93%   BMI 43.58 kg/m   BP Readings from Last 3 Encounters:  02/15/22 140/80  11/15/21 136/84  08/16/21 138/84    Wt Readings from Last 3 Encounters:  02/15/22 246 lb (111.6 kg)  11/15/21 243 lb (110.2 kg)  08/16/21 243 lb (110.2 kg)    Physical Exam  Lab Results  Component Value Date   WBC 6.8 05/10/2021  HGB 12.2 05/10/2021   HCT 36.4 05/10/2021   PLT 231.0 05/10/2021   GLUCOSE 98 08/16/2021   CHOL 198 11/09/2020   TRIG 166.0 (H) 11/09/2020   HDL 91.80 11/09/2020   LDLDIRECT 107.0 10/14/2019   LDLCALC 73 11/09/2020   ALT 32 11/09/2020   AST 25 11/09/2020   NA 141 08/16/2021   K 4.8 08/16/2021   CL 108 08/16/2021   CREATININE 1.65 (H) 08/16/2021   BUN 30 (H) 08/16/2021   CO2 26 08/16/2021   TSH 0.94 11/15/2021   HGBA1C 5.3 04/28/2009    MR LUMBAR SPINE WO CONTRAST  Result Date: 11/20/2021 CLINICAL DATA:  Lumbar radiculopathy.  Severe low back pain. EXAM: MRI LUMBAR SPINE WITHOUT CONTRAST TECHNIQUE: Multiplanar, multisequence MR imaging of the lumbar spine was performed. No intravenous contrast was administered. COMPARISON:  MRI of the lumbar spine April 02, 2021 FINDINGS: Segmentation:  Standard. Alignment: Levoconvex scoliosis.  Anterolisthesis of L4 over L5, unchanged. Small retrolisthesis of L1 over L2 and L3 over L4. Vertebrae: No fracture, evidence of discitis, or bone lesion. Endplate degenerative changes, more pronounced at T11-12, L2-3 and L4-5. Conus medullaris and cauda equina: Conus extends to the L1-2 level. Conus and cauda equina appear normal. Paraspinal and other soft tissues: Cholelithiasis. Disc levels: T11-12: Only evaluated on sagittal. Loss of disc height, disc bulge and facet degenerative changes resulting in mild left neural foraminal narrowing. No significant spinal canal stenosis. T12-L1: No spinal canal or neural foraminal stenosis. L1-2: Loss of disc height, disc bulge with superimposed new tiny inferiorly migrating right central disc extrusion. Moderate facet degenerative changes with mild bilateral joint effusion. Findings result in mild spinal canal stenosis with mild narrowing of the bilateral subarticular zones and mild bilateral neural foraminal narrowing. L2-3: Loss of disc height, disc bulge with associated osteophytic component, moderate facet degenerative changes and ligamentum flavum redundancy resulting in mild narrowing of the bilateral subarticular zones, mild-to-moderate right and mild left neural foraminal narrowing. No significant change from prior. L3-4: Loss of disc height, disc bulge with associated right foraminal/far lateral osteophytic component and superimposed small left foraminal disc protrusion, moderate facet degenerative changes ligamentum flavum redundancy. Findings result in mild narrowing of the bilateral subarticular zones, moderate right and mild left neural foraminal narrowing, not significantly changed from prior. L4-5: Anterolisthesis, loss of disc height, disc bulge/disc uncovering mildly asymmetric to the left, prominent hypertrophic facet degenerative changes with ligamentum flavum redundancy and bilateral joint effusion. Findings result in moderate spinal canal stenosis with  moderate narrowing of the bilateral subarticular zones, mild right and moderate left neural foraminal narrowing. No significant change from prior. L5-S1: Left asymmetric disc bulge with associated left foraminal/far lateral osteophytic component, hypertrophic facet degenerative changes with bilateral joint effusion. Findings result in moderate left neural foraminal narrowing, unchanged. No spinal canal stenosis. IMPRESSION: 1. Degenerative changes of the lumbar spine mostly unchanged from prior MRI, except for new tiny inferiorly migrating right central disc extrusion at L1-2. 2. Moderate spinal canal stenosis at L4-5 with mild right and moderate left neural foraminal narrowing. 3. Moderate left neural foraminal narrowing at L5-S1. 4. Prominent facet arthropathy at L4-5 and L5-S1 with associated joint effusion. 5. Cholelithiasis. Electronically Signed   By: Pedro Earls M.D.   On: 11/20/2021 09:03    Assessment & Plan:   Diagnoses and all orders for this visit:  Essential hypertension -     Basic metabolic panel; Future -     CBC with Differential/Platelet; Future  Parathyroid hormone excess (  [facility]) -     Basic metabolic panel; Future -     DG Bone Density; Future  Other specified hypothyroidism  Kidney disease, chronic, stage IV (GFR 15-29 ml/min) (HCC) -     Basic metabolic panel; Future  Pure hyperglyceridemia -     Lipid panel; Future  Mild intermittent asthma without complication -     Fluticasone-Umeclidin-Vilant (TRELEGY ELLIPTA) 100-62.5-25 MCG/ACT AEPB; Inhale 1 puff into the lungs daily.  Estrogen deficiency -     DG Bone Density; Future   I have changed Leslie Edwards's Trelegy Ellipta to Fluticasone-Umeclidin-Vilant. I am also having her maintain her albuterol, levocetirizine, vitamin C, multivitamin, b complex vitamins, famotidine, ipratropium-albuterol, mometasone, omega-3 acid ethyl esters, pregabalin, allopurinol, buPROPion, irbesartan, liothyronine,  potassium chloride, pravastatin, empagliflozin, torsemide, Vilazodone HCl, and oxyCODONE.  Meds ordered this encounter  Medications   Fluticasone-Umeclidin-Vilant (TRELEGY ELLIPTA) 100-62.5-25 MCG/ACT AEPB    Sig: Inhale 1 puff into the lungs daily.    Dispense:  3 each    Refill:  1     Follow-up: No follow-ups on file.  Scarlette Calico, MD

## 2022-02-15 NOTE — Patient Instructions (Signed)
Hypercalcemia Hypercalcemia is when the level of calcium in a person's blood is above normal. The body needs calcium to make bones and keep them strong. Calcium also helps the muscles, nerves, brain, and heart work the way they should. Most of the calcium in the body is stored in the bones. There is also calcium in the blood. Hypercalcemia occurs when there is too much calcium in your blood. Calcium levels in the blood are regulated by hormones, kidneys, and the gastrointestinal tract.  Hypercalcemia can happen when calcium comes out of the bones, or when the kidneys are not able to remove calcium from the blood. Hypercalcemia can be mild or severe. What are the causes? There are many possible causes of hypercalcemia. Common causes of this condition include: Hyperparathyroidism. This is a condition in which the body produces too much parathyroid hormone. There are four parathyroid glands in your neck. These glands produce a chemical messenger (hormone) that helps the body absorb calcium from foods and helps your bones release calcium. Certain kinds of cancer. Less common causes of hypercalcemia include: Calcium and vitamin D dietary supplements. Chronic kidney disease. Hyperthyroidism. Severe dehydration. Being on bed rest or being inactive for a long time. Certain medicines. Infections. What increases the risk? You are more likely to develop this condition if: You are female. You are 60 years of age or older. You have a family history of hypercalcemia. What are the signs or symptoms? Mild hypercalcemia that starts slowly may not cause symptoms. Severe, sudden hypercalcemia is more likely to cause symptoms, such as: Being more thirsty than usual. Needing to urinate more often than usual. Abdominal pain. Nausea and vomiting. Constipation. Muscle pain, twitching, or weakness. Feeling very tired. How is this diagnosed?  Hypercalcemia is usually diagnosed with a blood test. You may also  have tests to help check what is causing this condition. Tests include imaging tests and more blood tests. How is this treated? Treatment for hypercalcemia depends on the cause. Treatment may include: Receiving fluids through an IV. Medicines. These can be used to: Keep calcium levels steady after receiving fluids (loop diuretics). Keep calcium in your bones (bisphosphonates). Lower the calcium level in your blood. Surgery to remove overactive parathyroid glands. A procedure that filters your blood to correct calcium levels (hemodialysis). Follow these instructions at home:  Take over-the-counter and prescription medicines only as told by your health care provider. Follow instructions from your health care provider about eating or drinking restrictions. Drink enough fluid to keep your urine pale yellow. Stay active. Weight-bearing exercise helps to keep calcium in your bones. Follow instructions from your health care provider about what type and level of exercise is safe for you. Keep all follow-up visits. This is important. Contact a health care provider if: You have a fever. Your heartbeat is irregular or very fast. You have changes in mood, memory, or personality. Get help right away if: You have severe abdominal pain. You have chest pain. You have trouble breathing. You become very confused and sleepy. You lose consciousness. These symptoms may represent a serious problem that is an emergency. Do not wait to see if the symptoms will go away. Get medical help right away. Call your local emergency services (911 in the [place] Do not drive yourself to the hospital. Summary Hypercalcemia is when the level of calcium in a person's blood is above normal. The body needs calcium to make bones and keep them strong. There are many possible causes of hypercalcemia, and treatment depends on   the cause. Take over-the-counter and prescription medicines only as told by your health care  provider. This information is not intended to replace advice given to you by your health care provider. Make sure you discuss any questions you have with your health care provider. Document Revised: 02/08/2021 Document Reviewed: 02/08/2021 Elsevier Patient Education  2023 [employer]  

## 2022-02-27 DIAGNOSIS — M47816 Spondylosis without myelopathy or radiculopathy, lumbar region: Secondary | ICD-10-CM | POA: Diagnosis not present

## 2022-02-27 DIAGNOSIS — M5459 Other low back pain: Secondary | ICD-10-CM | POA: Diagnosis not present

## 2022-03-06 DIAGNOSIS — M5459 Other low back pain: Secondary | ICD-10-CM | POA: Diagnosis not present

## 2022-03-06 DIAGNOSIS — M47816 Spondylosis without myelopathy or radiculopathy, lumbar region: Secondary | ICD-10-CM | POA: Diagnosis not present

## 2022-03-09 ENCOUNTER — Encounter: Payer: Self-pay | Admitting: Internal Medicine

## 2022-03-12 ENCOUNTER — Other Ambulatory Visit: Payer: Self-pay | Admitting: Internal Medicine

## 2022-03-12 DIAGNOSIS — M5136 Other intervertebral disc degeneration, lumbar region: Secondary | ICD-10-CM

## 2022-03-12 DIAGNOSIS — M48062 Spinal stenosis, lumbar region with neurogenic claudication: Secondary | ICD-10-CM

## 2022-03-12 DIAGNOSIS — M5416 Radiculopathy, lumbar region: Secondary | ICD-10-CM | POA: Diagnosis not present

## 2022-03-12 DIAGNOSIS — M47816 Spondylosis without myelopathy or radiculopathy, lumbar region: Secondary | ICD-10-CM | POA: Diagnosis not present

## 2022-03-12 DIAGNOSIS — M170 Bilateral primary osteoarthritis of knee: Secondary | ICD-10-CM

## 2022-03-12 MED ORDER — OXYCODONE HCL 5 MG PO TABS: 5.0000 mg | ORAL_TABLET | ORAL | 0 refills | Status: DC | PRN

## 2022-03-22 ENCOUNTER — Telehealth: Payer: Medicare Other

## 2022-03-27 ENCOUNTER — Other Ambulatory Visit: Payer: Self-pay | Admitting: Internal Medicine

## 2022-03-27 DIAGNOSIS — F418 Other specified anxiety disorders: Secondary | ICD-10-CM

## 2022-03-27 DIAGNOSIS — I10 Essential (primary) hypertension: Secondary | ICD-10-CM

## 2022-03-27 DIAGNOSIS — M1A39X0 Chronic gout due to renal impairment, multiple sites, without tophus (tophi): Secondary | ICD-10-CM

## 2022-03-27 DIAGNOSIS — E038 Other specified hypothyroidism: Secondary | ICD-10-CM

## 2022-03-27 DIAGNOSIS — E785 Hyperlipidemia, unspecified: Secondary | ICD-10-CM

## 2022-03-29 DIAGNOSIS — M5459 Other low back pain: Secondary | ICD-10-CM | POA: Diagnosis not present

## 2022-03-29 DIAGNOSIS — M47816 Spondylosis without myelopathy or radiculopathy, lumbar region: Secondary | ICD-10-CM | POA: Diagnosis not present

## 2022-04-06 DIAGNOSIS — M5459 Other low back pain: Secondary | ICD-10-CM | POA: Diagnosis not present

## 2022-04-06 DIAGNOSIS — M47816 Spondylosis without myelopathy or radiculopathy, lumbar region: Secondary | ICD-10-CM | POA: Diagnosis not present

## 2022-04-20 DIAGNOSIS — M47816 Spondylosis without myelopathy or radiculopathy, lumbar region: Secondary | ICD-10-CM | POA: Diagnosis not present

## 2022-04-20 DIAGNOSIS — M5459 Other low back pain: Secondary | ICD-10-CM | POA: Diagnosis not present

## 2022-04-26 ENCOUNTER — Other Ambulatory Visit: Payer: Self-pay | Admitting: Internal Medicine

## 2022-04-26 ENCOUNTER — Encounter: Payer: Self-pay | Admitting: Internal Medicine

## 2022-04-26 DIAGNOSIS — M48062 Spinal stenosis, lumbar region with neurogenic claudication: Secondary | ICD-10-CM

## 2022-04-26 DIAGNOSIS — M170 Bilateral primary osteoarthritis of knee: Secondary | ICD-10-CM

## 2022-04-26 DIAGNOSIS — M5136 Other intervertebral disc degeneration, lumbar region: Secondary | ICD-10-CM

## 2022-04-26 MED ORDER — OXYCODONE HCL 5 MG PO TABS: 5.0000 mg | ORAL_TABLET | ORAL | 0 refills | Status: DC | PRN

## 2022-04-26 NOTE — Telephone Encounter (Signed)
Check [place] registry last filled 03/12/2022.Marland KitchenJohny Edwards

## 2022-04-27 DIAGNOSIS — M5459 Other low back pain: Secondary | ICD-10-CM | POA: Diagnosis not present

## 2022-04-27 DIAGNOSIS — M47816 Spondylosis without myelopathy or radiculopathy, lumbar region: Secondary | ICD-10-CM | POA: Diagnosis not present

## 2022-05-04 ENCOUNTER — Other Ambulatory Visit: Payer: Self-pay | Admitting: Internal Medicine

## 2022-05-04 DIAGNOSIS — E781 Pure hyperglyceridemia: Secondary | ICD-10-CM

## 2022-05-16 DIAGNOSIS — M47816 Spondylosis without myelopathy or radiculopathy, lumbar region: Secondary | ICD-10-CM | POA: Diagnosis not present

## 2022-05-16 DIAGNOSIS — M5459 Other low back pain: Secondary | ICD-10-CM | POA: Diagnosis not present

## 2022-05-22 ENCOUNTER — Ambulatory Visit (INDEPENDENT_AMBULATORY_CARE_PROVIDER_SITE_OTHER): Payer: Medicare Other | Admitting: Internal Medicine

## 2022-05-22 ENCOUNTER — Encounter: Payer: Self-pay | Admitting: Internal Medicine

## 2022-05-22 VITALS — BP 134/84 | HR 62 | Temp 98.0°F | Resp 16 | Wt 244.0 lb

## 2022-05-22 DIAGNOSIS — N184 Chronic kidney disease, stage 4 (severe): Secondary | ICD-10-CM

## 2022-05-22 DIAGNOSIS — R9431 Abnormal electrocardiogram [ECG] [EKG]: Secondary | ICD-10-CM

## 2022-05-22 DIAGNOSIS — I10 Essential (primary) hypertension: Secondary | ICD-10-CM | POA: Diagnosis not present

## 2022-05-22 DIAGNOSIS — R0609 Other forms of dyspnea: Secondary | ICD-10-CM

## 2022-05-22 DIAGNOSIS — I5032 Chronic diastolic (congestive) heart failure: Secondary | ICD-10-CM | POA: Diagnosis not present

## 2022-05-22 DIAGNOSIS — E038 Other specified hypothyroidism: Secondary | ICD-10-CM

## 2022-05-22 DIAGNOSIS — E213 Hyperparathyroidism, unspecified: Secondary | ICD-10-CM | POA: Diagnosis not present

## 2022-05-22 LAB — CBC WITH DIFFERENTIAL/PLATELET
Basophils Absolute: 0.1 10*3/uL (ref 0.0–0.1)
Basophils Relative: 0.8 % (ref 0.0–3.0)
Eosinophils Absolute: 0.2 10*3/uL (ref 0.0–0.7)
Eosinophils Relative: 3.5 % (ref 0.0–5.0)
HCT: 38.9 % (ref 36.0–46.0)
Hemoglobin: 13.2 g/dL (ref 12.0–15.0)
Lymphocytes Relative: 25.8 % (ref 12.0–46.0)
Lymphs Abs: 1.7 10*3/uL (ref 0.7–4.0)
MCHC: 33.9 g/dL (ref 30.0–36.0)
MCV: 97.2 fl (ref 78.0–100.0)
Monocytes Absolute: 0.6 10*3/uL (ref 0.1–1.0)
Monocytes Relative: 9.1 % (ref 3.0–12.0)
Neutro Abs: 4 10*3/uL (ref 1.4–7.7)
Neutrophils Relative %: 60.8 % (ref 43.0–77.0)
Platelets: 221 10*3/uL (ref 150.0–400.0)
RBC: 4 Mil/uL (ref 3.87–5.11)
RDW: 14.1 % (ref 11.5–15.5)
WBC: 6.6 10*3/uL (ref 4.0–10.5)

## 2022-05-22 LAB — BRAIN NATRIURETIC PEPTIDE: Pro B Natriuretic peptide (BNP): 79 pg/mL (ref 0.0–100.0)

## 2022-05-22 LAB — BASIC METABOLIC PANEL
BUN: 28 mg/dL — ABNORMAL HIGH (ref 6–23)
CO2: 26 mEq/L (ref 19–32)
Calcium: 9.8 mg/dL (ref 8.4–10.5)
Chloride: 107 mEq/L (ref 96–112)
Creatinine, Ser: 1.86 mg/dL — ABNORMAL HIGH (ref 0.40–1.20)
GFR: 25.83 mL/min — ABNORMAL LOW (ref >60.00)
Glucose, Bld: 93 mg/dL (ref 70–99)
Potassium: 4.6 mEq/L (ref 3.5–5.1)
Sodium: 144 mEq/L (ref 135–145)

## 2022-05-22 LAB — TROPONIN I (HIGH SENSITIVITY): High Sens Troponin I: 4 ng/L (ref 2–17)

## 2022-05-22 NOTE — Patient Instructions (Signed)
Hypertension, Adult High blood pressure (hypertension) is when the force of blood pumping through the arteries is too strong. The arteries are the blood vessels that carry blood from the heart throughout the body. Hypertension forces the heart to work harder to pump blood and may cause arteries to become narrow or stiff. Untreated or uncontrolled hypertension can lead to a heart attack, heart failure, a stroke, kidney disease, and other problems. A blood pressure reading consists of a higher number over a lower number. Ideally, your blood pressure should be below 120/80. The first ("top") number is called the systolic pressure. It is a measure of the pressure in your arteries as your heart beats. The second ("bottom") number is called the diastolic pressure. It is a measure of the pressure in your arteries as the heart relaxes. What are the causes? The exact cause of this condition is not known. There are some conditions that result in high blood pressure. What increases the risk? Certain factors may make you more likely to develop high blood pressure. Some of these risk factors are under your control, including: Smoking. Not getting enough exercise or physical activity. Being overweight. Having too much fat, sugar, calories, or salt (sodium) in your diet. Drinking too much alcohol. Other risk factors include: Having a personal history of heart disease, diabetes, high cholesterol, or kidney disease. Stress. Having a family history of high blood pressure and high cholesterol. Having obstructive sleep apnea. Age. The risk increases with age. What are the signs or symptoms? High blood pressure may not cause symptoms. Very high blood pressure (hypertensive crisis) may cause: Headache. Fast or irregular heartbeats (palpitations). Shortness of breath. Nosebleed. Nausea and vomiting. Vision changes. Severe chest pain, dizziness, and seizures. How is this diagnosed? This condition is diagnosed by  measuring your blood pressure while you are seated, with your arm resting on a flat surface, your legs uncrossed, and your feet flat on the floor. The cuff of the blood pressure monitor will be placed directly against the skin of your upper arm at the level of your heart. Blood pressure should be measured at least twice using the same arm. Certain conditions can cause a difference in blood pressure between your right and left arms. If you have a high blood pressure reading during one visit or you have normal blood pressure with other risk factors, you may be asked to: Return on a different day to have your blood pressure checked again. Monitor your blood pressure at home for 1 week or longer. If you are diagnosed with hypertension, you may have other blood or imaging tests to help your health care provider understand your overall risk for other conditions. How is this treated? This condition is treated by making healthy lifestyle changes, such as eating healthy foods, exercising more, and reducing your alcohol intake. You may be referred for counseling on a healthy diet and physical activity. Your health care provider may prescribe medicine if lifestyle changes are not enough to get your blood pressure under control and if: Your systolic blood pressure is above 130. Your diastolic blood pressure is above 80. Your personal target blood pressure may vary depending on your medical conditions, your age, and other factors. Follow these instructions at home: Eating and drinking  Eat a diet that is high in fiber and potassium, and low in sodium, added sugar, and fat. An example of this eating plan is called the DASH diet. DASH stands for Dietary Approaches to Stop Hypertension. To eat this way: Eat   plenty of fresh fruits and vegetables. Try to fill one half of your plate at each meal with fruits and vegetables. Eat whole grains, such as whole-wheat pasta, brown rice, or whole-grain bread. Fill about one  fourth of your plate with whole grains. Eat or drink low-fat dairy products, such as skim milk or low-fat yogurt. Avoid fatty cuts of meat, processed or cured meats, and poultry with skin. Fill about one fourth of your plate with lean proteins, such as fish, chicken without skin, beans, eggs, or tofu. Avoid pre-made and processed foods. These tend to be higher in sodium, added sugar, and fat. Reduce your daily sodium intake. Many people with hypertension should eat less than 1,500 mg of sodium a day. Do not drink alcohol if: Your health care provider tells you not to drink. You are pregnant, may be pregnant, or are planning to become pregnant. If you drink alcohol: Limit how much you have to: 0-1 drink a day for women. 0-2 drinks a day for men. Know how much alcohol is in your drink. In the [place] one drink equals one 12 oz bottle of beer (355 mL), one 5 oz glass of wine (148 mL), or one 1 oz glass of hard liquor (44 mL). Lifestyle  Work with your health care provider to maintain a healthy body weight or to lose weight. Ask what an ideal weight is for you. Get at least 30 minutes of exercise that causes your heart to beat faster (aerobic exercise) most days of the week. Activities may include walking, swimming, or biking. Include exercise to strengthen your muscles (resistance exercise), such as Pilates or lifting weights, as part of your weekly exercise routine. Try to do these types of exercises for 30 minutes at least 3 days a week. Do not use any products that contain nicotine or tobacco. These products include cigarettes, chewing tobacco, and vaping devices, such as e-cigarettes. If you need help quitting, ask your health care provider. Monitor your blood pressure at home as told by your health care provider. Keep all follow-up visits. This is important. Medicines Take over-the-counter and prescription medicines only as told by your health care provider. Follow directions carefully. Blood  pressure medicines must be taken as prescribed. Do not skip doses of blood pressure medicine. Doing this puts you at risk for problems and can make the medicine less effective. Ask your health care provider about side effects or reactions to medicines that you should watch for. Contact a health care provider if you: Think you are having a reaction to a medicine you are taking. Have headaches that keep coming back (recurring). Feel dizzy. Have swelling in your ankles. Have trouble with your vision. Get help right away if you: Develop a severe headache or confusion. Have unusual weakness or numbness. Feel faint. Have severe pain in your chest or abdomen. Vomit repeatedly. Have trouble breathing. These symptoms may be an emergency. Get help right away. Call 911. Do not wait to see if the symptoms will go away. Do not drive yourself to the hospital. Summary Hypertension is when the force of blood pumping through your arteries is too strong. If this condition is not controlled, it may put you at risk for serious complications. Your personal target blood pressure may vary depending on your medical conditions, your age, and other factors. For most people, a normal blood pressure is less than 120/80. Hypertension is treated with lifestyle changes, medicines, or a combination of both. Lifestyle changes include losing weight, eating a healthy,   low-sodium diet, exercising more, and limiting alcohol. This information is not intended to replace advice given to you by your health care provider. Make sure you discuss any questions you have with your health care provider. Document Revised: 07/11/2021 Document Reviewed: 07/11/2021 Elsevier Patient Education  2023 [employer]  

## 2022-05-22 NOTE — Progress Notes (Signed)
Subjective:  Patient ID: Leslie Edwards, female    DOB: 10/11/1945  Age: 76 y.o. MRN: [id number]  CC: Hypertension   HPI Leslie Edwards presents for f/up -  She complains of a 3 month hx of DOE with no CP or diaphoresis.  She has stable lower extremity edema.  Outpatient Medications Prior to Visit  Medication Sig Dispense Refill   albuterol (PROVENTIL HFA;VENTOLIN HFA) 108 (90 Base) MCG/ACT inhaler Inhale 2 puffs into the lungs every 6 (six) hours as needed. 18 g 11   allopurinol (ZYLOPRIM) 100 MG tablet Take 1 tablet (100 mg total) by mouth daily. 90 tablet 1   Ascorbic Acid (VITAMIN C) 1000 MG tablet Take 1,000 mg by mouth daily.     b complex vitamins tablet Take 1 tablet by mouth daily.     buPROPion (WELLBUTRIN XL) 150 MG 24 hr tablet Take 3 tablets (450 mg total) by mouth daily. 270 tablet 1   empagliflozin (JARDIANCE) 10 MG TABS tablet Take 1 tablet (10 mg total) by mouth daily before breakfast. 90 tablet 1   famotidine (PEPCID) 10 MG tablet Take 10 mg by mouth 2 (two) times daily as needed for heartburn or indigestion.     Fluticasone-Umeclidin-Vilant (TRELEGY ELLIPTA) 100-62.5-25 MCG/ACT AEPB Inhale 1 puff into the lungs daily. 3 each 1   ipratropium-albuterol (DUONEB) 0.5-2.5 (3) MG/3ML SOLN Take 3 mLs by nebulization as needed. 360 mL 1   irbesartan (AVAPRO) 150 MG tablet Take 1 tablet (150 mg total) by mouth daily. 90 tablet 1   liothyronine (CYTOMEL) 25 MCG tablet Take 1 tablet (25 mcg total) by mouth daily. 90 tablet 1   mometasone (NASONEX) 50 MCG/ACT nasal spray Place 2 sprays into the nose as needed. 3 each 1   Multiple Vitamin (MULTIVITAMIN) tablet Take 1 tablet by mouth daily.     omega-3 acid ethyl esters (LOVAZA) 1 g capsule TAKE TWO CAPSULES BY MOUTH TWICE DAILY 360 capsule 1   oxyCODONE (OXY IR/ROXICODONE) 5 MG immediate release tablet Take 1 tablet (5 mg total) by mouth every 4 (four) hours as needed for severe pain. 100 tablet 0   potassium chloride (KLOR-CON)  10 MEQ tablet Take 1 tablet (10 mEq total) by mouth daily. 90 tablet 1   pravastatin (PRAVACHOL) 20 MG tablet Take 1 tablet (20 mg total) by mouth daily. 90 tablet 1   pregabalin (LYRICA) 75 MG capsule Take 75 mg by mouth 3 (three) times daily. Takes 2-3 times daily as needed     torsemide (DEMADEX) 20 MG tablet TAKE ONE TABLET BY MOUTH DAILY 90 tablet 1   Vilazodone HCl (VIIBRYD) 40 MG TABS TAKE ONE TABLET ONCE DAILY 30 tablet 5   levocetirizine (XYZAL) 5 MG tablet Take 2 tablets (10 mg total) by mouth every evening for 7 days. 14 tablet 0   No facility-administered medications prior to visit.    ROS Review of Systems  Constitutional:  Negative for diaphoresis, fatigue and unexpected weight change.  HENT: Negative.    Eyes: Negative.   Respiratory:  Positive for shortness of breath. Negative for cough, chest tightness and wheezing.   Cardiovascular:  Negative for chest pain, palpitations and leg swelling.  Gastrointestinal:  Negative for abdominal pain, constipation, diarrhea, nausea and vomiting.  Endocrine: Negative.  Negative for polyuria.  Genitourinary: Negative.  Negative for difficulty urinating.  Musculoskeletal:  Positive for back pain. Negative for myalgias.  Skin: Negative.   Allergic/Immunologic: Negative.   Neurological: Negative.  Negative for dizziness,  weakness, light-headedness and headaches.  Hematological:  Negative for adenopathy. Does not bruise/bleed easily.  Psychiatric/Behavioral: Negative.      Objective:  BP 134/84   Pulse 62   Temp 98 F (36.7 C) (Oral)   Resp 16   Wt 244 lb (110.7 kg)   SpO2 92%   BMI 43.22 kg/m   BP Readings from Last 3 Encounters:  05/22/22 134/84  02/15/22 140/80  11/15/21 136/84    Wt Readings from Last 3 Encounters:  05/22/22 244 lb (110.7 kg)  02/15/22 246 lb (111.6 kg)  11/15/21 243 lb (110.2 kg)    Physical Exam Vitals reviewed.  Constitutional:      Appearance: She is obese. She is not ill-appearing.  HENT:      Mouth/Throat:     Mouth: Mucous membranes are moist.  Eyes:     General: No scleral icterus.    Conjunctiva/sclera: Conjunctivae normal.  Cardiovascular:     Rate and Rhythm: Normal rate and regular rhythm.     Heart sounds: Murmur heard.     Systolic murmur is present with a grade of 2/6.     No diastolic murmur is present.     No gallop.     Comments: 2/6 RUSB SEM She has 1+ nonpitting edema.  EKG- NSR, 67 bpm Low voltage Inferior infarct pattern is old Inverted/flat T waves V1-V6 are new No LVH Pulmonary:     Breath sounds: No stridor. No wheezing, rhonchi or rales.  Abdominal:     General: Abdomen is protuberant. There is no distension.     Palpations: There is no hepatomegaly, splenomegaly or mass.     Tenderness: There is no abdominal tenderness. There is no guarding.     Hernia: No hernia is present.  Musculoskeletal:     Cervical back: Neck supple.     Right lower leg: 1+ Edema present.     Left lower leg: 1+ Edema present.  Lymphadenopathy:     Cervical: No cervical adenopathy.  Skin:    General: Skin is warm and dry.  Neurological:     General: No focal deficit present.     Mental Status: She is alert.  Psychiatric:        Mood and Affect: Mood normal.        Behavior: Behavior normal.     Lab Results  Component Value Date   WBC 6.6 05/22/2022   HGB 13.2 05/22/2022   HCT 38.9 05/22/2022   PLT 221.0 05/22/2022   GLUCOSE 93 05/22/2022   CHOL 174 02/15/2022   TRIG 140.0 02/15/2022   HDL 75.00 02/15/2022   LDLDIRECT 107.0 10/14/2019   LDLCALC 71 02/15/2022   ALT 32 11/09/2020   AST 25 11/09/2020   NA 144 05/22/2022   K 4.6 05/22/2022   CL 107 05/22/2022   CREATININE 1.86 (H) 05/22/2022   BUN 28 (H) 05/22/2022   CO2 26 05/22/2022   TSH 0.94 11/15/2021   HGBA1C 5.3 04/28/2009    MR LUMBAR SPINE WO CONTRAST  Result Date: 11/20/2021 CLINICAL DATA:  Lumbar radiculopathy.  Severe low back pain. EXAM: MRI LUMBAR SPINE WITHOUT CONTRAST  TECHNIQUE: Multiplanar, multisequence MR imaging of the lumbar spine was performed. No intravenous contrast was administered. COMPARISON:  MRI of the lumbar spine April 02, 2021 FINDINGS: Segmentation:  Standard. Alignment: Levoconvex scoliosis. Anterolisthesis of L4 over L5, unchanged. Small retrolisthesis of L1 over L2 and L3 over L4. Vertebrae: No fracture, evidence of discitis, or bone lesion. Endplate degenerative changes,  more pronounced at T11-12, L2-3 and L4-5. Conus medullaris and cauda equina: Conus extends to the L1-2 level. Conus and cauda equina appear normal. Paraspinal and other soft tissues: Cholelithiasis. Disc levels: T11-12: Only evaluated on sagittal. Loss of disc height, disc bulge and facet degenerative changes resulting in mild left neural foraminal narrowing. No significant spinal canal stenosis. T12-L1: No spinal canal or neural foraminal stenosis. L1-2: Loss of disc height, disc bulge with superimposed new tiny inferiorly migrating right central disc extrusion. Moderate facet degenerative changes with mild bilateral joint effusion. Findings result in mild spinal canal stenosis with mild narrowing of the bilateral subarticular zones and mild bilateral neural foraminal narrowing. L2-3: Loss of disc height, disc bulge with associated osteophytic component, moderate facet degenerative changes and ligamentum flavum redundancy resulting in mild narrowing of the bilateral subarticular zones, mild-to-moderate right and mild left neural foraminal narrowing. No significant change from prior. L3-4: Loss of disc height, disc bulge with associated right foraminal/far lateral osteophytic component and superimposed small left foraminal disc protrusion, moderate facet degenerative changes ligamentum flavum redundancy. Findings result in mild narrowing of the bilateral subarticular zones, moderate right and mild left neural foraminal narrowing, not significantly changed from prior. L4-5: Anterolisthesis,  loss of disc height, disc bulge/disc uncovering mildly asymmetric to the left, prominent hypertrophic facet degenerative changes with ligamentum flavum redundancy and bilateral joint effusion. Findings result in moderate spinal canal stenosis with moderate narrowing of the bilateral subarticular zones, mild right and moderate left neural foraminal narrowing. No significant change from prior. L5-S1: Left asymmetric disc bulge with associated left foraminal/far lateral osteophytic component, hypertrophic facet degenerative changes with bilateral joint effusion. Findings result in moderate left neural foraminal narrowing, unchanged. No spinal canal stenosis. IMPRESSION: 1. Degenerative changes of the lumbar spine mostly unchanged from prior MRI, except for new tiny inferiorly migrating right central disc extrusion at L1-2. 2. Moderate spinal canal stenosis at L4-5 with mild right and moderate left neural foraminal narrowing. 3. Moderate left neural foraminal narrowing at L5-S1. 4. Prominent facet arthropathy at L4-5 and L5-S1 with associated joint effusion. 5. Cholelithiasis. Electronically Signed   By: Pedro Earls M.D.   On: 11/20/2021 09:03    Assessment & Plan:   Mardelle was seen today for hypertension.  Diagnoses and all orders for this visit:  DOE (dyspnea on exertion)- Her troponin and BNP are normal.  She has changes on her EKG.  I recommended that she undergo a myocardial perfusion imaging to evaluate for ischemia. -     CBC with Differential/Platelet; Future -     Troponin I (High Sensitivity); Future -     Brain natriuretic peptide; Future -     Brain natriuretic peptide -     Troponin I (High Sensitivity) -     CBC with Differential/Platelet -     MYOCARDIAL PERFUSION IMAGING; Future -     Cardiac Stress Test: Informed Consent Details: Physician/Practitioner Attestation; Transcribe to consent form and obtain patient signature; Future  Essential hypertension- Her blood  pressure is adequately well controlled. -     Basic metabolic panel; Future -     CBC with Differential/Platelet; Future -     CBC with Differential/Platelet -     Basic metabolic panel  Diastolic dysfunction with chronic heart failure (HCC)-she has a normal volume status. -     MYOCARDIAL PERFUSION IMAGING; Future -     Cardiac Stress Test: Informed Consent Details: Physician/Practitioner Attestation; Transcribe to consent form and obtain patient signature;  Future  Kidney disease, chronic, stage IV (GFR 15-29 ml/min) ([place])- Her renal function has declined slightly.  Abnormal electrocardiogram (ECG) (EKG) -     Troponin I (High Sensitivity); Future -     Brain natriuretic peptide; Future -     Brain natriuretic peptide -     Troponin I (High Sensitivity) -     MYOCARDIAL PERFUSION IMAGING; Future -     Cardiac Stress Test: Informed Consent Details: Physician/Practitioner Attestation; Transcribe to consent form and obtain patient signature; Future  Other specified hypothyroidism- She is euthyroid. -     Thyroid Panel With TSH; Future -     Thyroid Panel With TSH  Parathyroid hormone excess ([place])- I will monitor her calcium level. -     Basic metabolic panel; Future -     Basic metabolic panel  Abnormal electrocardiogram -     MYOCARDIAL PERFUSION IMAGING; Future -     Cardiac Stress Test: Informed Consent Details: Physician/Practitioner Attestation; Transcribe to consent form and obtain patient signature; Future   I am having Zakari H. Mullarkey maintain her albuterol, levocetirizine, vitamin C, multivitamin, b complex vitamins, famotidine, ipratropium-albuterol, mometasone, pregabalin, empagliflozin, torsemide, Vilazodone HCl, Fluticasone-Umeclidin-Vilant, pravastatin, potassium chloride, irbesartan, liothyronine, buPROPion, allopurinol, oxyCODONE, and omega-3 acid ethyl esters.  No orders of the defined types were placed in this encounter.  I spent 50 minutes in preparing to see  the patient by review of recent labs and images, obtaining and reviewing separately obtained history, communicating with the patient, ordering medications, tests or procedures, and documenting clinical information in the EHR including the differential Dx, treatment, and any further evaluation and management of multiple complex medical issues.     Follow-up: Return in about 3 months (around 08/21/2022).  Scarlette Calico, MD

## 2022-05-23 ENCOUNTER — Telehealth (HOSPITAL_COMMUNITY): Payer: Self-pay | Admitting: *Deleted

## 2022-05-23 LAB — THYROID PANEL WITH TSH
Free Thyroxine Index: 1.8 (ref 1.4–3.8)
T3 Uptake: 31 % (ref 22–35)
T4, Total: 5.9 ug/dL (ref 5.1–11.9)
TSH: 2.75 mIU/L (ref 0.40–4.50)

## 2022-05-23 NOTE — Telephone Encounter (Signed)
Close encounter 

## 2022-05-24 ENCOUNTER — Ambulatory Visit (HOSPITAL_COMMUNITY)
Admission: RE | Admit: 2022-05-24 | Discharge: 2022-05-24 | Disposition: A | Discharge status: Home / Self Care | Payer: Medicare Other | Source: Ambulatory Visit | Attending: Cardiology | Admitting: Cardiology

## 2022-05-24 DIAGNOSIS — R0609 Other forms of dyspnea: Secondary | ICD-10-CM

## 2022-05-24 DIAGNOSIS — I5032 Chronic diastolic (congestive) heart failure: Secondary | ICD-10-CM | POA: Diagnosis not present

## 2022-05-24 DIAGNOSIS — R9431 Abnormal electrocardiogram [ECG] [EKG]: Secondary | ICD-10-CM

## 2022-05-24 MED ORDER — REGADENOSON 0.4 MG/5ML IV SOLN
0.4000 mg | Freq: Once | INTRAVENOUS | Status: AC
  Administered 2022-05-24: 0.4 mg via INTRAVENOUS

## 2022-05-24 MED ORDER — TECHNETIUM TC 99M TETROFOSMIN IV KIT
29.0000 | PACK | Freq: Once | INTRAVENOUS | Status: AC | PRN
  Administered 2022-05-24: 29 via INTRAVENOUS

## 2022-05-25 ENCOUNTER — Ambulatory Visit (HOSPITAL_COMMUNITY)
Admission: RE | Admit: 2022-05-25 | Discharge: 2022-05-25 | Disposition: A | Discharge status: Home / Self Care | Payer: Medicare Other | Source: Ambulatory Visit | Attending: Cardiovascular Disease | Admitting: Cardiovascular Disease

## 2022-05-25 LAB — MYOCARDIAL PERFUSION IMAGING
LV dias vol: 130 mL (ref 46–106)
LV sys vol: 59 mL
Nuc Stress EF: 55 %
Peak HR: 80 {beats}/min
Rest HR: 67 {beats}/min
Rest Nuclear Isotope Dose: 32.6 mCi
SDS: 2
SRS: 5
SSS: 7
ST Depression (mm): 0 mm
Stress Nuclear Isotope Dose: 29 mCi
TID: 1.01

## 2022-05-25 MED ORDER — TECHNETIUM TC 99M TETROFOSMIN IV KIT
29.0000 | PACK | Freq: Once | INTRAVENOUS | Status: AC | PRN
  Administered 2022-05-25: 32.6 via INTRAVENOUS

## 2022-05-26 ENCOUNTER — Encounter: Payer: Self-pay | Admitting: Internal Medicine

## 2022-05-26 ENCOUNTER — Other Ambulatory Visit: Payer: Self-pay | Admitting: Internal Medicine

## 2022-05-26 DIAGNOSIS — R9439 Abnormal result of other cardiovascular function study: Secondary | ICD-10-CM | POA: Insufficient documentation

## 2022-05-28 ENCOUNTER — Telehealth: Payer: Self-pay | Admitting: Cardiovascular Disease

## 2022-05-28 NOTE — Telephone Encounter (Signed)
Called pt to sch referral appt.  She would like to know if Dr Burt Knack would take her on as a new pt.  She stated he is her husband Dr.  [name].  She would like to keep the same Dr.

## 2022-05-29 NOTE — Telephone Encounter (Signed)
Called and spoke with patient who states she has been having some exertional SOB "for the last few months" and wants to establish with Dr Burt Knack. He has agreed to see her. Scheduled for 06/18/22 at 1:40pm.

## 2022-05-30 ENCOUNTER — Emergency Department (HOSPITAL_COMMUNITY): Payer: Medicare Other

## 2022-05-30 ENCOUNTER — Emergency Department (HOSPITAL_COMMUNITY)
Admission: EM | Admit: 2022-05-30 | Discharge: 2022-05-30 | Disposition: A | Discharge status: Home / Self Care | Payer: Medicare Other | Attending: Emergency Medicine | Admitting: Emergency Medicine

## 2022-05-30 ENCOUNTER — Encounter (HOSPITAL_COMMUNITY): Payer: Self-pay

## 2022-05-30 ENCOUNTER — Other Ambulatory Visit: Payer: Self-pay

## 2022-05-30 DIAGNOSIS — R0600 Dyspnea, unspecified: Secondary | ICD-10-CM | POA: Insufficient documentation

## 2022-05-30 DIAGNOSIS — I509 Heart failure, unspecified: Secondary | ICD-10-CM | POA: Diagnosis not present

## 2022-05-30 DIAGNOSIS — N184 Chronic kidney disease, stage 4 (severe): Secondary | ICD-10-CM | POA: Diagnosis not present

## 2022-05-30 DIAGNOSIS — R0602 Shortness of breath: Secondary | ICD-10-CM | POA: Insufficient documentation

## 2022-05-30 DIAGNOSIS — Z79899 Other long term (current) drug therapy: Secondary | ICD-10-CM | POA: Insufficient documentation

## 2022-05-30 DIAGNOSIS — I130 Hypertensive heart and chronic kidney disease with heart failure and stage 1 through stage 4 chronic kidney disease, or unspecified chronic kidney disease: Secondary | ICD-10-CM | POA: Diagnosis not present

## 2022-05-30 DIAGNOSIS — R0789 Other chest pain: Secondary | ICD-10-CM | POA: Diagnosis not present

## 2022-05-30 DIAGNOSIS — N179 Acute kidney failure, unspecified: Secondary | ICD-10-CM | POA: Diagnosis not present

## 2022-05-30 DIAGNOSIS — M5459 Other low back pain: Secondary | ICD-10-CM | POA: Diagnosis not present

## 2022-05-30 DIAGNOSIS — M7989 Other specified soft tissue disorders: Secondary | ICD-10-CM | POA: Insufficient documentation

## 2022-05-30 DIAGNOSIS — R079 Chest pain, unspecified: Secondary | ICD-10-CM | POA: Diagnosis not present

## 2022-05-30 DIAGNOSIS — E039 Hypothyroidism, unspecified: Secondary | ICD-10-CM | POA: Insufficient documentation

## 2022-05-30 DIAGNOSIS — M47816 Spondylosis without myelopathy or radiculopathy, lumbar region: Secondary | ICD-10-CM | POA: Diagnosis not present

## 2022-05-30 LAB — BASIC METABOLIC PANEL
Anion gap: 15 (ref 5–15)
BUN: 45 mg/dL — ABNORMAL HIGH (ref 8–23)
CO2: 24 mmol/L (ref 22–32)
Calcium: 10 mg/dL (ref 8.9–10.3)
Chloride: 102 mmol/L (ref 98–111)
Creatinine, Ser: 2.59 mg/dL — ABNORMAL HIGH (ref 0.44–1.00)
GFR, Estimated: 19 mL/min — ABNORMAL LOW (ref >60)
Glucose, Bld: 93 mg/dL (ref 70–99)
Potassium: 5.2 mmol/L — ABNORMAL HIGH (ref 3.5–5.1)
Sodium: 141 mmol/L (ref 135–145)

## 2022-05-30 LAB — CBC WITH DIFFERENTIAL/PLATELET
Abs Immature Granulocytes: 0.02 10*3/uL (ref 0.00–0.07)
Basophils Absolute: 0.1 10*3/uL (ref 0.0–0.1)
Basophils Relative: 1 %
Eosinophils Absolute: 0.3 10*3/uL (ref 0.0–0.5)
Eosinophils Relative: 5 %
HCT: 39.1 % (ref 36.0–46.0)
Hemoglobin: 13 g/dL (ref 12.0–15.0)
Immature Granulocytes: 0 %
Lymphocytes Relative: 28 %
Lymphs Abs: 2.1 10*3/uL (ref 0.7–4.0)
MCH: 33.2 pg (ref 26.0–34.0)
MCHC: 33.2 g/dL (ref 30.0–36.0)
MCV: 100 fL (ref 80.0–100.0)
Monocytes Absolute: 0.9 10*3/uL (ref 0.1–1.0)
Monocytes Relative: 12 %
Neutro Abs: 4.1 10*3/uL (ref 1.7–7.7)
Neutrophils Relative %: 54 %
Platelets: 236 10*3/uL (ref 150–400)
RBC: 3.91 MIL/uL (ref 3.87–5.11)
RDW: 14.5 % (ref 11.5–15.5)
WBC: 7.5 10*3/uL (ref 4.0–10.5)
nRBC: 0 % (ref 0.0–0.2)

## 2022-05-30 LAB — BRAIN NATRIURETIC PEPTIDE: B Natriuretic Peptide: 30 pg/mL (ref 0.0–100.0)

## 2022-05-30 LAB — TROPONIN I (HIGH SENSITIVITY)
Troponin I (High Sensitivity): 5 ng/L (ref <18)
Troponin I (High Sensitivity): 5 ng/L (ref <18)

## 2022-05-30 NOTE — ED Triage Notes (Signed)
Sent by physical therapist due increased shortness of breath with exercises.  CP during exercises today.  Reports never had that before. Patient has been having sob for awhile but not this severe per patient.  Patient had myocardial perf study on 9/8 with signs of ischemia.  Patient has appt with dr Burt Knack on 10/2

## 2022-05-30 NOTE — ED Provider Triage Note (Signed)
Emergency Medicine Provider Triage Evaluation Note  Leslie Edwards , a 76 y.o. female  was evaluated in triage.  Pt complains of an episode of worsening shortness of breath with "chest pressure" that lasted a few minutes during physical therapy earlier today.  Usually has shortness of breath on exertion, however usually relieves within 2 to 3 minutes.  Today took at least 5 to 10 minutes before it resolves.  No recent upper respiratory symptoms or fevers.  Denies active chest pain or pressure.  Hx of HTN, CKD stage IV, LVH, hyperlipidemia.  Review of Systems  Positive:  Negative: See above  Physical Exam  BP (!) 142/75   Pulse 72   Temp 98.2 F (36.8 C) (Oral)   Resp 18   Ht '5\' 3"'$  (1.6 m)   Wt 110.7 kg   SpO2 95%   BMI 43.22 kg/m  Gen:   Awake, no distress   Resp:  Normal effort, CTAB MSK:   Moves extremities without difficulty  Other:  RRR without M/R/G, chest non-TTP  Medical Decision Making  Medically screening exam initiated at 2:50 PM.  Appropriate orders placed.  Leslie Edwards was informed that the remainder of the evaluation will be completed by another provider, this initial triage assessment does not replace that evaluation, and the importance of remaining in the ED until their evaluation is complete.     Leslie Rome, PA-C [id number]

## 2022-05-30 NOTE — Addendum Note (Signed)
Addended by: Hinda Kehr on: 05/30/2022 04:11 PM   Modules accepted: Orders

## 2022-05-30 NOTE — ED Notes (Signed)
This RN stuck the patient x2 in attempt to draw the troponin level but not successful.

## 2022-05-30 NOTE — Discharge Instructions (Signed)
You were seen in the emergency department for your shortness of breath.  Your work-up here showed that your kidney function was a little bit elevated from your usual.  Your labs otherwise showed a normal heart enzyme, normal heart rhythm on EKG and no signs of fluid or pneumonia on your lungs.  It is unclear the cause of your shortness of breath at this time, but you should continue your outpatient cardiology work-up.  You should also follow-up with your primary doctor to have your kidney function rechecked.  You should return to the emergency department if you are having worsening shortness of breath, you are having chest pain with your shortness of breath especially if it does not go away, or if you have any other new or concerning symptoms.

## 2022-05-30 NOTE — ED Provider Notes (Signed)
[place] EMERGENCY DEPARTMENT Provider Note   CSN: [id number] Arrival date & time: 05/30/22  1403     History  Chief Complaint  Patient presents with   Shortness of Breath    Loella Hickle Cruzen is a 76 y.o. female.  Patient is a 76 year old female with a past medical history of CKD stage IV, CHF, hypertension, and hypothyroidism presenting to the emergency department with shortness of breath.  The patient states that over the last several weeks she has had increasing dyspnea on exertion.  She states that she initially had work-up started by her primary doctor that included a cardiac perfusion study for an abnormal EKG.  She states that this study was positive and she is planned to follow-up with cardiology as an outpatient in a few weeks.  She states that today during physical therapy she had worsening shortness of breath and a brief episode of chest heaviness.  She states that the chest heaviness resolved immediately upon rest.  She states that her physical therapist recommended that she come to the emergency department for further evaluation.  She denies any recent fever or cough.  She denies any other chest pain episodes.  She states she has had mild increased swelling in her legs.  The history is provided by the patient.  Shortness of Breath      Home Medications Prior to Admission medications   Medication Sig Start Date End Date Taking? Authorizing Provider  albuterol (PROVENTIL HFA;VENTOLIN HFA) 108 (90 Base) MCG/ACT inhaler Inhale 2 puffs into the lungs every 6 (six) hours as needed. 09/27/16   Janith Lima, MD  allopurinol (ZYLOPRIM) 100 MG tablet Take 1 tablet (100 mg total) by mouth daily. 03/27/22   Janith Lima, MD  Ascorbic Acid (VITAMIN C) 1000 MG tablet Take 1,000 mg by mouth daily.    [provider]  b complex vitamins tablet Take 1 tablet by mouth daily.    [provider]  buPROPion (WELLBUTRIN XL) 150 MG 24 hr tablet Take 3  tablets (450 mg total) by mouth daily. 03/27/22   Janith Lima, MD  empagliflozin (JARDIANCE) 10 MG TABS tablet Take 1 tablet (10 mg total) by mouth daily before breakfast. 11/15/21   Janith Lima, MD  famotidine (PEPCID) 10 MG tablet Take 10 mg by mouth 2 (two) times daily as needed for heartburn or indigestion.    [provider]  Fluticasone-Umeclidin-Vilant (TRELEGY ELLIPTA) 100-62.5-25 MCG/ACT AEPB Inhale 1 puff into the lungs daily. 02/15/22   Janith Lima, MD  ipratropium-albuterol (DUONEB) 0.5-2.5 (3) MG/3ML SOLN Take 3 mLs by nebulization as needed. 02/07/21   Janith Lima, MD  irbesartan (AVAPRO) 150 MG tablet Take 1 tablet (150 mg total) by mouth daily. 03/27/22   Janith Lima, MD  levocetirizine (XYZAL) 5 MG tablet Take 2 tablets (10 mg total) by mouth every evening for 7 days. 06/22/19 06/29/19  Janith Lima, MD  liothyronine (CYTOMEL) 25 MCG tablet Take 1 tablet (25 mcg total) by mouth daily. 03/27/22   Janith Lima, MD  mometasone (NASONEX) 50 MCG/ACT nasal spray Place 2 sprays into the nose as needed. 02/08/21   Janith Lima, MD  Multiple Vitamin (MULTIVITAMIN) tablet Take 1 tablet by mouth daily.    [provider]  omega-3 acid ethyl esters (LOVAZA) 1 g capsule TAKE TWO CAPSULES BY MOUTH TWICE DAILY 05/04/22   Janith Lima, MD  oxyCODONE (OXY IR/ROXICODONE) 5 MG immediate release tablet Take  1 tablet (5 mg total) by mouth every 4 (four) hours as needed for severe pain. 04/26/22   Janith Lima, MD  potassium chloride (KLOR-CON) 10 MEQ tablet Take 1 tablet (10 mEq total) by mouth daily. 03/27/22   Janith Lima, MD  pravastatin (PRAVACHOL) 20 MG tablet Take 1 tablet (20 mg total) by mouth daily. 03/27/22   Janith Lima, MD  pregabalin (LYRICA) 75 MG capsule Take 75 mg by mouth 3 (three) times daily. Takes 2-3 times daily as needed 05/09/21   [provider]  torsemide (DEMADEX) 20 MG tablet TAKE ONE TABLET BY MOUTH DAILY 01/06/22   Janith Lima, MD  Vilazodone HCl (VIIBRYD) 40 MG TABS TAKE ONE TABLET ONCE DAILY 01/09/22   Janith Lima, MD  K-DUR 10 MEQ tablet TAKE 1 TABLET DAILY. 03/14/12 03/16/13  Ricard Dillon, MD      Allergies    Penicillins and Prochlorperazine edisylate    Review of Systems   Review of Systems  Respiratory:  Positive for shortness of breath.     Physical Exam Updated Vital Signs BP (!) 115/56   Pulse 63   Temp 98.2 F (36.8 C) (Oral)   Resp 17   Ht '5\' 3"'$  (1.6 m)   Wt 110.7 kg   SpO2 100%   BMI 43.22 kg/m  Physical Exam Vitals and nursing note reviewed.  Constitutional:      General: She is not in acute distress.    Appearance: She is well-developed.  HENT:     Head: Normocephalic and atraumatic.  Eyes:     Extraocular Movements: Extraocular movements intact.  Cardiovascular:     Rate and Rhythm: Normal rate and regular rhythm.     Pulses: Normal pulses.     Heart sounds: Normal heart sounds.  Pulmonary:     Effort: Pulmonary effort is normal.     Breath sounds: Normal breath sounds.  Musculoskeletal:        General: Normal range of motion.     Cervical back: Normal range of motion and neck supple.     Right lower leg: Edema (trace, non-pitting) present.     Left lower leg: Edema (trace, non-pitting) present.  Skin:    General: Skin is warm and dry.  Neurological:     General: No focal deficit present.     Mental Status: She is alert and oriented to person, place, and time.  Psychiatric:        Mood and Affect: Mood normal.        Behavior: Behavior normal.     ED Results / Procedures / Treatments   Labs (all labs ordered are listed, but only abnormal results are displayed) Labs Reviewed  BASIC METABOLIC PANEL - Abnormal; Notable for the following components:      Result Value   Potassium 5.2 (*)    BUN 45 (*)    Creatinine, Ser 2.59 (*)    GFR, Estimated 19 (*)    All other components within normal limits  CBC WITH DIFFERENTIAL/PLATELET  BRAIN NATRIURETIC  PEPTIDE  TROPONIN I (HIGH SENSITIVITY)  TROPONIN I (HIGH SENSITIVITY)    EKG EKG Interpretation  Date/Time:  Wednesday May 30 2022 14:18:46 EDT Ventricular Rate:  75 PR Interval:  192 QRS Duration: 76 QT Interval:  392 QTC Calculation: 437 R Axis:   -86 Text Interpretation: Normal sinus rhythm Left axis deviation Low voltage QRS Inferior infarct , age undetermined Anterolateral infarct , age undetermined Abnormal ECG  No previous ECGs available Confirmed by Oneal Deputy [id number]) on 05/30/2022 4:03:09 PM  Radiology DG Chest 2 View  Result Date: 05/30/2022 CLINICAL DATA:  Shortness of breath and chest pain EXAM: CHEST - 2 VIEW COMPARISON:  02/07/2021 FINDINGS: Chronic cardiomegaly and aortic atherosclerosis. Chronic lung markings with areas of linear scarring. No sign of active infiltrate, collapse or effusion. No frank pulmonary edema. No acute bone finding. IMPRESSION: Cardiomegaly. Aortic atherosclerosis. Chronic interstitial lung markings and pulmonary scarring. No active process identified. Electronically Signed   By: Nelson Chimes M.D.   On: 05/30/2022 15:27    Procedures Procedures    Medications Ordered in ED Medications - No data to display  ED Course/ Medical Decision Making/ A&P Clinical Course as of 05/30/22 1853  Wed May 30, 2022  1850 Repeat troponin remains negative.  Patient's creatinine is elevated from her baseline with a mildly elevated potassium without hyperkalemic EKG changes.  The patient was offered admission for observation for cardiology evaluation and creatinine trending but states that she would prefer to be discharged home.  She states that she will be able to follow-up with her primary doctor for repeat labs within 1 weeks and has a cardiology follow-up scheduled.  She was given strict return precautions. [VK]    Clinical Course User Index [VK] Ottie Glazier, DO                           Medical Decision Making This patient presents to  the ED with chief complaint(s) of shortness of breath with pertinent past medical history of CKD, CHF, hypothyroidism which further complicates the presenting complaint. The complaint involves an extensive differential diagnosis and also carries with it a high risk of complications and morbidity.    The differential diagnosis includes ACS, arrhythmia, CHF exacerbation, lumbar edema or volume overload, PE unlikely as she was low risk by Wells criteria, pneumothorax unlikely as she has equal breath sounds and shortness of breath is intermittent  Additional history obtained: Additional history obtained from family Records reviewed Primary Care Documents  ED Course and Reassessment: She was initially evaluated by provider in triage and had labs, EKG and chest x-ray performed to evaluate cause of her shortness of breath.  Patient's recently had a perfusion study that had evidence of ischemia and is planned for follow-up with cardiology.  EKG here shows no acute ischemic changes.  Labs are pending at this time.  Independent labs interpretation:  The following labs were independently interpreted: Mildly worsening creatinine compared to baseline, labs otherwise within normal range  Independent visualization of imaging: - I independently visualized the following imaging with scope of interpretation limited to determining acute life threatening conditions related to emergency care: Chest x-ray, which revealed no acute disease  Consultation: - Consulted or discussed management/test interpretation w/ external professional: N/A  Consideration for admission or further workup: Patient offered admission for observation of her kidney function and cardiology evaluation, however using shared decision making she would prefer discharge home with outpatient follow-up Social Determinants of health: N/A            Final Clinical Impression(s) / ED Diagnoses Final diagnoses:  None    Rx / DC Orders ED  Discharge Orders     None         Ottie Glazier, DO 05/30/22 1853

## 2022-05-30 NOTE — ED Notes (Signed)
Clara Rn at bedside to attempt to obtain troponin

## 2022-06-05 ENCOUNTER — Telehealth: Payer: Self-pay | Admitting: *Deleted

## 2022-06-05 NOTE — Telephone Encounter (Signed)
     Patient  visit on 05/30/2022  at [facility]  was for SOB  Have you been able to follow up with your primary care physician? patient seeing cardiologist and is feeling better no longer short of breathe and has no needs at this time  The patient was able to obtain any needed medicine or equipment.  Are there diet recommendations that you are having difficulty following?  Patient expresses understanding of discharge instructions and education provided has no other needs at this time.   [facility] [phone] [address] , [place] [address] Email : [email or web address]

## 2022-06-18 ENCOUNTER — Encounter: Payer: Self-pay | Admitting: Cardiovascular Disease

## 2022-06-18 ENCOUNTER — Ambulatory Visit: Payer: Medicare Other | Attending: Cardiovascular Disease | Admitting: Cardiovascular Disease

## 2022-06-18 VITALS — BP 118/64 | HR 82 | Ht 63.5 in | Wt 242.6 lb

## 2022-06-18 DIAGNOSIS — I700 Atherosclerosis of aorta: Secondary | ICD-10-CM

## 2022-06-18 DIAGNOSIS — I10 Essential (primary) hypertension: Secondary | ICD-10-CM

## 2022-06-18 DIAGNOSIS — Z6841 Body Mass Index (BMI) 40.0 and over, adult: Secondary | ICD-10-CM

## 2022-06-18 DIAGNOSIS — R0602 Shortness of breath: Secondary | ICD-10-CM | POA: Diagnosis not present

## 2022-06-18 DIAGNOSIS — R931 Abnormal findings on diagnostic imaging of heart and coronary circulation: Secondary | ICD-10-CM

## 2022-06-18 DIAGNOSIS — N184 Chronic kidney disease, stage 4 (severe): Secondary | ICD-10-CM

## 2022-06-18 NOTE — Patient Instructions (Signed)
Medication Instructions:  STOP Torsemide and Potassium *If you need a refill on your cardiac medications before your next appointment, please call your pharmacy*   Lab Work: NONE If you have labs (blood work) drawn today and your tests are completely normal, you will receive your results only by: [facility] (if you have MyChart) OR A paper copy in the mail If you have any lab test that is abnormal or we need to change your treatment, we will call you to review the results.   Testing/Procedures: ECHO Your physician has requested that you have an echocardiogram. Echocardiography is a painless test that uses sound waves to create images of your heart. It provides your doctor with information about the size and shape of your heart and how well your heart's chambers and valves are working. This procedure takes approximately one hour. There are no restrictions for this procedure.  Follow-Up: At [facility], you and your health needs are our priority.  As part of our continuing mission to provide you with exceptional heart care, we have created designated Provider Care Teams.  These Care Teams include your primary Cardiologist (physician) and Advanced Practice Providers (APPs -  Physician Assistants and Nurse Practitioners) who all work together to provide you with the care you need, when you need it.  Your next appointment:   6 month(s)  The format for your next appointment:   In Person  Provider:   Sherren Mocha, MD     Important Information About Sugar

## 2022-06-18 NOTE — Progress Notes (Signed)
Cardiology Office Note:    Date:  06/18/2022   ID:  Leslie Edwards, DOB 1946/05/29, MRN [id number]  PCP:  Leslie Lima, MD   [place] Providers Cardiologist:  Leslie Mocha, MD     Referring MD: Leslie Lima, MD   Chief Complaint  Patient presents with   Shortness of Breath    History of Present Illness:    Leslie Edwards is a 76 y.o. female presenting for evaluation of shortness of breath.   The patient is here with her husband, Leslie Edwards, who is a patient of mine.  She is referred because of shortness of breath with exertion.  She has developed exertional dyspnea over the last 2 to 3 months that has been progressive.  She denies cough, fever, chills, orthopnea, or PND.  She does have some leg swelling at times but this has been managed with torsemide with a good result.  She has not had any problems with leg swelling recently.  She is short of breath with low-level activity such as getting dressed or walking short distances.  She has been significantly limited by low back problems and she ambulates with a walker.  She has gained weight over the past few years.  The patient's evaluation included an echocardiogram and 2000 that showed no significant abnormalities.  She had a myocardial perfusion scan just performed recently showing a stress perfusion defect over small area of the inferior wall, overall felt to be an abnormal but low risk study.  Again, she denies any chest pain or pressure.  She was recently seen in the emergency room for worsening shortness of breath that occurred when she was doing physical therapy.  Her testing there demonstrated a normal high-sensitivity troponin and normal BNP.  Chest x-ray showed cardiomegaly, aortic atherosclerosis, and chronic interstitial lung markings and pulmonary scarring.  There is no active process identified.  Past Medical History:  Diagnosis Date   Allergy    ANEMIA, B12 DEFICIENCY 01/28/2009   Qualifier: Diagnosis of  By:  Arnoldo Morale MD, Balinda Quails    Anxiety    Asthma    GERD (gastroesophageal reflux disease)    Hypertension    Hypothyroidism    OSTEOPOROSIS 05/06/2007   Qualifier: Diagnosis of  By: Arnoldo Morale MD, John E    Parathyroid hormone excess ([place]) 08/31/2013   Secondary to low dietary calcium/calcium malabsorption    PLANTAR FASCIITIS, LEFT 07/05/2009   Qualifier: Diagnosis of  By: Arnoldo Morale MD, Balinda Quails    Rosacea 02/25/2009   Qualifier: Diagnosis of  By: Arnoldo Morale MD, John E    [place]    UNSPECIFIED IRON DEFICIENCY ANEMIA 01/28/2009   Qualifier: Diagnosis of  By: Arnoldo Morale MD, John E    VENOUS INSUFFICIENCY, CHRONIC 04/28/2007   Qualifier: Diagnosis of  By: Scherrie Gerlach      Past Surgical History:  Procedure Laterality Date   BREAST BIOPSY Left    BREAST EXCISIONAL BIOPSY Right    CARPAL TUNNEL RELEASE     KNEE ARTHROSCOPY     right   LIPOMA EXCISION  1965   axilary   nissan fundiplication     TONSILLECTOMY      Current Medications: Current Meds  Medication Sig   albuterol (PROVENTIL HFA;VENTOLIN HFA) 108 (90 Base) MCG/ACT inhaler Inhale 2 puffs into the lungs every 6 (six) hours as needed.   allopurinol (ZYLOPRIM) 100 MG tablet Take 1 tablet (100 mg total) by mouth daily.   Ascorbic Acid (VITAMIN C) 1000 MG  tablet Take 1,000 mg by mouth daily.   b complex vitamins tablet Take 1 tablet by mouth daily.   buPROPion (WELLBUTRIN XL) 150 MG 24 hr tablet Take 3 tablets (450 mg total) by mouth daily.   empagliflozin (JARDIANCE) 10 MG TABS tablet Take 1 tablet (10 mg total) by mouth daily before breakfast.   famotidine (PEPCID) 10 MG tablet Take 10 mg by mouth 2 (two) times daily as needed for heartburn or indigestion.   Fluticasone-Umeclidin-Vilant (TRELEGY ELLIPTA) 100-62.5-25 MCG/ACT AEPB Inhale 1 puff into the lungs daily.   ipratropium-albuterol (DUONEB) 0.5-2.5 (3) MG/3ML SOLN Take 3 mLs by nebulization as needed.   irbesartan (AVAPRO) 150 MG tablet Take 1 tablet (150 mg total) by mouth daily.    liothyronine (CYTOMEL) 25 MCG tablet Take 1 tablet (25 mcg total) by mouth daily.   mometasone (NASONEX) 50 MCG/ACT nasal spray Place 2 sprays into the nose as needed.   Multiple Vitamin (MULTIVITAMIN) tablet Take 1 tablet by mouth daily.   omega-3 acid ethyl esters (LOVAZA) 1 g capsule TAKE TWO CAPSULES BY MOUTH TWICE DAILY   oxyCODONE (OXY IR/ROXICODONE) 5 MG immediate release tablet Take 1 tablet (5 mg total) by mouth every 4 (four) hours as needed for severe pain.   pravastatin (PRAVACHOL) 20 MG tablet Take 1 tablet (20 mg total) by mouth daily.   pregabalin (LYRICA) 75 MG capsule Take 75 mg by mouth 3 (three) times daily. Takes 2-3 times daily as needed   Vilazodone HCl (VIIBRYD) 40 MG TABS TAKE ONE TABLET ONCE DAILY   [DISCONTINUED] potassium chloride (KLOR-CON) 10 MEQ tablet Take 1 tablet (10 mEq total) by mouth daily.   [DISCONTINUED] torsemide (DEMADEX) 20 MG tablet TAKE ONE TABLET BY MOUTH DAILY     Allergies:   Penicillins and Prochlorperazine edisylate   Social History   Socioeconomic History   Marital status: Married    Spouse name: Not on file   Number of children: 1   Years of education: Not on file   Highest education level: Not on file  Occupational History   Not on file  Tobacco Use   Smoking status: Former    Types: Cigarettes    Quit date: 01/22/1975    Years since quitting: 47.4   Smokeless tobacco: Never  Vaping Use   Vaping Use: Never used  Substance and Sexual Activity   Alcohol use: Yes    Alcohol/week: 3.0 standard drinks of alcohol    Types: 3 Glasses of wine per week   Drug use: No   Sexual activity: Not Currently  Other Topics Concern   Not on file  Social History Narrative   Regular exercise: Not walking   Works 3 days per week at [employer]   Caffeine use: yes   Social Determinants of Health   Financial Resource Strain: Low Risk  (12/18/2021)   Overall Financial Resource Strain (CARDIA)    Difficulty of Paying Living  Expenses: Not hard at all  Food Insecurity: No Food Insecurity (12/18/2021)   Hunger Vital Sign    Worried About Running Out of Food in the Last Year: Never true    Ran Out of Food in the Last Year: Never true  Transportation Needs: No Transportation Needs (12/18/2021)   PRAPARE - Hydrologist (Medical): No    Lack of Transportation (Non-Medical): No  Physical Activity: Inactive (12/18/2021)   Exercise Vital Sign    Days of Exercise per Week: 0  days    Minutes of Exercise per Session: 0 min  Stress: No Stress Concern Present (12/18/2021)   [place]    Feeling of Stress : Not at all  Social Connections: Moderately Isolated (12/18/2021)   Social Connection and Isolation Panel [NHANES]    Frequency of Communication with Friends and Family: Twice a week    Frequency of Social Gatherings with Friends and Family: Twice a week    Attends Religious Services: Never    Printmaker: No    Attends Music therapist: Never    Marital Status: Married     Family History: The patient's family history includes Arthritis in her father; Cancer in her maternal aunt, maternal grandfather, maternal uncle, mother, and paternal grandmother; Dementia in her maternal grandmother; Healthy in her brother and brother; Lymphoma in her son; Macular degeneration in her father; Stroke in her maternal grandmother.  ROS:   Please see the history of present illness.    All other systems reviewed and are negative.  EKGs/Labs/Other Studies Reviewed:    The following studies were reviewed today: Echo 11/27/2018:  1. The left ventricle has normal systolic function with an ejection  fraction of 60-65%. The cavity size was normal. There is mildly increased  left ventricular wall thickness. Left ventricular diastolic Doppler  parameters are consistent with impaired  relaxation.   2. The right  ventricle has normal systolic function. The cavity was  normal. There is no increase in right ventricular wall thickness.   3. There is mild mitral annular calcification present. No evidence of  mitral valve stenosis. No mitral regurgitation.   4. The aortic valve is tricuspid no stenosis of the aortic valve.   5. The aortic root and ascending aorta are normal in size and structure.   6. Normal IVC size.   Myocardial Perfusion Scan:   Findings are consistent with ischemia. The study is low risk.   No ST deviation was noted.   LV perfusion is abnormal. Defect 1: There is a small defect with mild reduction in uptake present in the basal inferior, inferolateral and inferoseptal location(s) that is partially reversible. There is abnormal wall motion in the defect area. Consistent with ischemia.   Left ventricular function is normal. Nuclear stress EF: 55 %. The left ventricular ejection fraction is normal (55-65%). End diastolic cavity size is mildly enlarged. End systolic cavity size is normal.   Prior study not available for comparison.   Small area in the basal inferior, inferoseptal, and inferolateral wall with reduced perfusion at rest, slightly worse with stress. There is focal wall motion abnormalities in the inferoseptum with overall preserved EF. There is significant extracardiac activity, so artifact cannot be excluded, but with wall motion abnormality and decreased perfusion with stress, this suggests ischemia. Overall mild and small area, so low risk study.  EKG:  EKG is not ordered today.    Recent Labs: 05/22/2022: Pro B Natriuretic peptide (BNP) 79.0; TSH 2.75 05/30/2022: B Natriuretic Peptide 30.0; BUN 45; Creatinine, Ser 2.59; Hemoglobin 13.0; Platelets 236; Potassium 5.2; Sodium 141  Recent Lipid Panel    Component Value Date/Time   CHOL 174 02/15/2022 1004   TRIG 140.0 02/15/2022 1004   HDL 75.00 02/15/2022 1004   CHOLHDL 2 02/15/2022 1004   VLDL 28.0 02/15/2022 1004    LDLCALC 71 02/15/2022 1004   LDLDIRECT 107.0 10/14/2019 1155     Risk Assessment/Calculations:  Physical Exam:    VS:  BP 118/64   Pulse 82   Ht 5' 3.5" (1.613 m)   Wt 242 lb 9.6 oz (110 kg)   SpO2 96%   BMI 42.30 kg/m     Wt Readings from Last 3 Encounters:  06/18/22 242 lb 9.6 oz (110 kg)  05/30/22 244 lb (110.7 kg)  05/24/22 244 lb (110.7 kg)     GEN: Pleasant obese woman in no acute distress HEENT: Normal NECK: No JVD; No carotid bruits LYMPHATICS: No lymphadenopathy CARDIAC: RRR, 2/6 systolic ejection murmur at the right upper sternal border RESPIRATORY:  Clear to auscultation without rales, wheezing or rhonchi  ABDOMEN: Soft, non-tender, non-distended MUSCULOSKELETAL:  No edema; No deformity  SKIN: Warm and dry NEUROLOGIC:  Alert and oriented x 3 PSYCHIATRIC:  Normal affect   ASSESSMENT:    1. Shortness of breath   2. Aortic atherosclerosis ([place])   3. Morbid obesity with body mass index of 40.0-44.9 in adult ([place])   4. Essential hypertension   5. CKD (chronic kidney disease) stage 4, GFR 15-29 ml/min (HCC)   6. Abnormal nuclear cardiac imaging test    PLAN:    In order of problems listed above:  Unclear etiology at this point.  Possible that contributors include diastolic dysfunction, deconditioning, obesity, and interstitial lung process.  It is notable that her BNP was normal when she was seen with shortness of breath in the emergency room.  She does not have evidence of volume overload on exam.  I am going to update an echo to evaluate for LV systolic or diastolic dysfunction. Seen incidentally on chest x-ray imaging treated with a statin drug. Suspect this is contributing significantly to her shortness of breath Blood pressure well controlled on irbesartan Concerned about her progressive kidney disease.  In the past year her creatinine is increased from 1.2-1 0.9-2.59 on her most recent labs.  With a normal BNP, I have recommended that  she stop taking torsemide and potassium for now.  I will reach out to Dr. Joelyn Oms who follows her regularly but has not seen her in a while.  She may require a reduced dose of diuretic therapy in the future as I suspect she will begin to develop edema.  Again we will check an echocardiogram. Abnormal but low risk study with a small stress perfusion defect seen in the inferior wall.  There is significant artifact noted and I am sure her body habitus complicates interpretation.  She is having no chest pain and with her kidney disease, cardiac catheterization would not be a good idea.  I favored medical therapy for presumed CAD.   Medication Adjustments/Labs and Tests Ordered: Current medicines are reviewed at length with the patient today.  Concerns regarding medicines are outlined above.  Orders Placed This Encounter  Procedures   ECHOCARDIOGRAM COMPLETE   No orders of the defined types were placed in this encounter.   Patient Instructions  Medication Instructions:  STOP Torsemide and Potassium *If you need a refill on your cardiac medications before your next appointment, please call your pharmacy*   Lab Work: NONE If you have labs (blood work) drawn today and your tests are completely normal, you will receive your results only by: [place] (if you have MyChart) OR A paper copy in the mail If you have any lab test that is abnormal or we need to change your treatment, we will call you to review the results.   Testing/Procedures: ECHO Your physician has requested  that you have an echocardiogram. Echocardiography is a painless test that uses sound waves to create images of your heart. It provides your doctor with information about the size and shape of your heart and how well your heart's chambers and valves are working. This procedure takes approximately one hour. There are no restrictions for this procedure.  Follow-Up: At [facility], you and your health needs are our  priority.  As part of our continuing mission to provide you with exceptional heart care, we have created designated Provider Care Teams.  These Care Teams include your primary Cardiologist (physician) and Advanced Practice Providers (APPs -  Physician Assistants and Nurse Practitioners) who all work together to provide you with the care you need, when you need it.  Your next appointment:   6 month(s)  The format for your next appointment:   In Person  Provider:   Sherren Mocha, MD     Important Information About Sugar         Signed, Leslie Mocha, MD  06/18/2022 4:31 PM    [place]

## 2022-06-21 ENCOUNTER — Encounter: Payer: Self-pay | Admitting: Internal Medicine

## 2022-06-21 ENCOUNTER — Other Ambulatory Visit: Payer: Self-pay | Admitting: Internal Medicine

## 2022-06-21 DIAGNOSIS — M5136 Other intervertebral disc degeneration, lumbar region: Secondary | ICD-10-CM

## 2022-06-21 DIAGNOSIS — M170 Bilateral primary osteoarthritis of knee: Secondary | ICD-10-CM

## 2022-06-21 DIAGNOSIS — M48062 Spinal stenosis, lumbar region with neurogenic claudication: Secondary | ICD-10-CM

## 2022-06-21 MED ORDER — OXYCODONE HCL 5 MG PO TABS: 5.0000 mg | ORAL_TABLET | ORAL | 0 refills | Status: DC | PRN

## 2022-07-12 ENCOUNTER — Ambulatory Visit (HOSPITAL_COMMUNITY): Payer: Medicare Other | Attending: Cardiology

## 2022-07-12 DIAGNOSIS — R0602 Shortness of breath: Secondary | ICD-10-CM | POA: Diagnosis not present

## 2022-07-12 LAB — ECHOCARDIOGRAM COMPLETE
Area-P 1/2: 2.01 cm2
S' Lateral: 3.8 cm

## 2022-07-19 DIAGNOSIS — M47816 Spondylosis without myelopathy or radiculopathy, lumbar region: Secondary | ICD-10-CM | POA: Diagnosis not present

## 2022-07-19 DIAGNOSIS — M5416 Radiculopathy, lumbar region: Secondary | ICD-10-CM | POA: Diagnosis not present

## 2022-08-02 ENCOUNTER — Other Ambulatory Visit: Payer: Self-pay | Admitting: Internal Medicine

## 2022-08-02 DIAGNOSIS — I5032 Chronic diastolic (congestive) heart failure: Secondary | ICD-10-CM

## 2022-08-15 DIAGNOSIS — M47816 Spondylosis without myelopathy or radiculopathy, lumbar region: Secondary | ICD-10-CM | POA: Diagnosis not present

## 2022-08-15 DIAGNOSIS — M5416 Radiculopathy, lumbar region: Secondary | ICD-10-CM | POA: Diagnosis not present

## 2022-08-16 ENCOUNTER — Other Ambulatory Visit: Payer: Self-pay | Admitting: Internal Medicine

## 2022-08-16 ENCOUNTER — Encounter: Payer: Self-pay | Admitting: Internal Medicine

## 2022-08-16 DIAGNOSIS — M48062 Spinal stenosis, lumbar region with neurogenic claudication: Secondary | ICD-10-CM

## 2022-08-16 DIAGNOSIS — M5136 Other intervertebral disc degeneration, lumbar region: Secondary | ICD-10-CM

## 2022-08-16 DIAGNOSIS — M170 Bilateral primary osteoarthritis of knee: Secondary | ICD-10-CM

## 2022-08-16 MED ORDER — OXYCODONE HCL 5 MG PO TABS: 5.0000 mg | ORAL_TABLET | ORAL | 0 refills | Status: DC | PRN

## 2022-08-21 ENCOUNTER — Ambulatory Visit (INDEPENDENT_AMBULATORY_CARE_PROVIDER_SITE_OTHER): Payer: Medicare Other | Admitting: Internal Medicine

## 2022-08-21 ENCOUNTER — Encounter: Payer: Self-pay | Admitting: Internal Medicine

## 2022-08-21 VITALS — BP 122/72 | HR 71 | Temp 98.7°F | Resp 16 | Ht 63.5 in | Wt 240.0 lb

## 2022-08-21 DIAGNOSIS — E213 Hyperparathyroidism, unspecified: Secondary | ICD-10-CM

## 2022-08-21 DIAGNOSIS — Z23 Encounter for immunization: Secondary | ICD-10-CM

## 2022-08-21 DIAGNOSIS — Z0001 Encounter for general adult medical examination with abnormal findings: Secondary | ICD-10-CM

## 2022-08-21 DIAGNOSIS — I10 Essential (primary) hypertension: Secondary | ICD-10-CM | POA: Diagnosis not present

## 2022-08-21 DIAGNOSIS — E038 Other specified hypothyroidism: Secondary | ICD-10-CM | POA: Diagnosis not present

## 2022-08-21 DIAGNOSIS — N184 Chronic kidney disease, stage 4 (severe): Secondary | ICD-10-CM

## 2022-08-21 DIAGNOSIS — E2839 Other primary ovarian failure: Secondary | ICD-10-CM

## 2022-08-21 LAB — HEPATIC FUNCTION PANEL
ALT: 19 U/L (ref 0–35)
AST: 20 U/L (ref 0–37)
Albumin: 4.7 g/dL (ref 3.5–5.2)
Alkaline Phosphatase: 79 U/L (ref 39–117)
Bilirubin, Direct: 0.1 mg/dL (ref 0.0–0.3)
Total Bilirubin: 0.5 mg/dL (ref 0.2–1.2)
Total Protein: 7.4 g/dL (ref 6.0–8.3)

## 2022-08-21 LAB — VITAMIN D 25 HYDROXY (VIT D DEFICIENCY, FRACTURES): VITD: 67.86 ng/mL (ref 30.00–100.00)

## 2022-08-21 LAB — URINALYSIS, ROUTINE W REFLEX MICROSCOPIC
Bilirubin Urine: NEGATIVE
Hgb urine dipstick: NEGATIVE
Ketones, ur: NEGATIVE
Leukocytes,Ua: NEGATIVE
Nitrite: NEGATIVE
Specific Gravity, Urine: 1.015 (ref 1.000–1.030)
Total Protein, Urine: NEGATIVE
Urine Glucose: 100 — AB
Urobilinogen, UA: 0.2 (ref 0.0–1.0)
pH: 6.5 (ref 5.0–8.0)

## 2022-08-21 LAB — BASIC METABOLIC PANEL
BUN: 27 mg/dL — ABNORMAL HIGH (ref 6–23)
CO2: 28 mEq/L (ref 19–32)
Calcium: 9.5 mg/dL (ref 8.4–10.5)
Chloride: 104 mEq/L (ref 96–112)
Creatinine, Ser: 1.49 mg/dL — ABNORMAL HIGH (ref 0.40–1.20)
GFR: 33.65 mL/min — ABNORMAL LOW (ref >60.00)
Glucose, Bld: 95 mg/dL (ref 70–99)
Potassium: 4.4 mEq/L (ref 3.5–5.1)
Sodium: 139 mEq/L (ref 135–145)

## 2022-08-21 NOTE — Progress Notes (Signed)
Subjective:  Patient ID: Leslie Edwards, female    DOB: Jan 13, 1946  Age: 76 y.o. MRN: [id number]  CC: Annual Exam, Hypertension, and Hypothyroidism   HPI Manmeet H Delatte presents for a CPX and f/up -  She recently saw a cardiologist and was told to stop taking the loop diuretic and the potassium supplement.  Her shortness of breath is unchanged.  She denies chest pain, edema, diaphoresis, or palpitations.  Outpatient Medications Prior to Visit  Medication Sig Dispense Refill   albuterol (PROVENTIL HFA;VENTOLIN HFA) 108 (90 Base) MCG/ACT inhaler Inhale 2 puffs into the lungs every 6 (six) hours as needed. 18 g 11   allopurinol (ZYLOPRIM) 100 MG tablet Take 1 tablet (100 mg total) by mouth daily. 90 tablet 1   Ascorbic Acid (VITAMIN C) 1000 MG tablet Take 1,000 mg by mouth daily.     b complex vitamins tablet Take 1 tablet by mouth daily.     buPROPion (WELLBUTRIN XL) 150 MG 24 hr tablet Take 3 tablets (450 mg total) by mouth daily. 270 tablet 1   famotidine (PEPCID) 10 MG tablet Take 10 mg by mouth 2 (two) times daily as needed for heartburn or indigestion.     Fluticasone-Umeclidin-Vilant (TRELEGY ELLIPTA) 100-62.5-25 MCG/ACT AEPB Inhale 1 puff into the lungs daily. 3 each 1   ipratropium-albuterol (DUONEB) 0.5-2.5 (3) MG/3ML SOLN Take 3 mLs by nebulization as needed. 360 mL 1   irbesartan (AVAPRO) 150 MG tablet Take 1 tablet (150 mg total) by mouth daily. 90 tablet 1   JARDIANCE 10 MG TABS tablet Take 1 tablet (10 mg total) by mouth daily before breakfast. 90 tablet 1   liothyronine (CYTOMEL) 25 MCG tablet Take 1 tablet (25 mcg total) by mouth daily. 90 tablet 1   mometasone (NASONEX) 50 MCG/ACT nasal spray Place 2 sprays into the nose as needed. 3 each 1   Multiple Vitamin (MULTIVITAMIN) tablet Take 1 tablet by mouth daily.     omega-3 acid ethyl esters (LOVAZA) 1 g capsule TAKE TWO CAPSULES BY MOUTH TWICE DAILY 360 capsule 1   oxyCODONE (OXY IR/ROXICODONE) 5 MG immediate release  tablet Take 1 tablet (5 mg total) by mouth every 4 (four) hours as needed for severe pain. 100 tablet 0   pravastatin (PRAVACHOL) 20 MG tablet Take 1 tablet (20 mg total) by mouth daily. 90 tablet 1   pregabalin (LYRICA) 75 MG capsule Take 75 mg by mouth 3 (three) times daily. Takes 2-3 times daily as needed     Vilazodone HCl (VIIBRYD) 40 MG TABS TAKE ONE TABLET ONCE DAILY 30 tablet 5   No facility-administered medications prior to visit.    ROS Review of Systems  Constitutional: Negative.  Negative for diaphoresis and fatigue.  HENT: Negative.    Eyes: Negative.   Respiratory:  Positive for shortness of breath. Negative for cough, chest tightness and wheezing.   Cardiovascular:  Negative for chest pain, palpitations and leg swelling.  Gastrointestinal:  Positive for constipation. Negative for abdominal pain, nausea and vomiting.  Endocrine: Negative.   Genitourinary: Negative.  Negative for difficulty urinating.  Musculoskeletal:  Positive for arthralgias and back pain. Negative for joint swelling and myalgias.  Skin: Negative.  Negative for color change.  Neurological: Negative.  Negative for dizziness and weakness.  Hematological:  Negative for adenopathy. Does not bruise/bleed easily.  Psychiatric/Behavioral: Negative.      Objective:  BP 122/72 (BP Location: Left Arm, Patient Position: Sitting, Cuff Size: Large)   Pulse 71  Temp 98.7 F (37.1 C) (Oral)   Resp 16   Ht 5' 3.5" (1.613 m)   Wt 240 lb (108.9 kg)   SpO2 92%   BMI 41.85 kg/m   BP Readings from Last 3 Encounters:  08/21/22 122/72  06/18/22 118/64  05/30/22 (!) 141/66    Wt Readings from Last 3 Encounters:  08/21/22 240 lb (108.9 kg)  06/18/22 242 lb 9.6 oz (110 kg)  05/30/22 244 lb (110.7 kg)    Physical Exam Vitals reviewed.  HENT:     Nose: Nose normal.  Eyes:     General: No scleral icterus.    Conjunctiva/sclera: Conjunctivae normal.  Cardiovascular:     Rate and Rhythm: Normal rate and  regular rhythm.     Heart sounds: S1 normal and S2 normal. Murmur heard.     Systolic murmur is present with a grade of 2/6.     No diastolic murmur is present.     No friction rub. No gallop.  Pulmonary:     Effort: Pulmonary effort is normal.     Breath sounds: No stridor. No wheezing, rhonchi or rales.  Abdominal:     General: Abdomen is flat.     Palpations: There is no mass.     Tenderness: There is no abdominal tenderness. There is no guarding.     Hernia: No hernia is present.  Musculoskeletal:        General: Normal range of motion.     Cervical back: Neck supple.     Right lower leg: No edema.     Left lower leg: No edema.  Lymphadenopathy:     Cervical: No cervical adenopathy.  Skin:    General: Skin is warm and dry.  Neurological:     General: No focal deficit present.     Mental Status: She is alert.  Psychiatric:        Mood and Affect: Mood normal.        Behavior: Behavior normal.     Lab Results  Component Value Date   WBC 7.5 05/30/2022   HGB 13.0 05/30/2022   HCT 39.1 05/30/2022   PLT 236 05/30/2022   GLUCOSE 95 08/21/2022   CHOL 174 02/15/2022   TRIG 140.0 02/15/2022   HDL 75.00 02/15/2022   LDLDIRECT 107.0 10/14/2019   LDLCALC 71 02/15/2022   ALT 19 08/21/2022   AST 20 08/21/2022   NA 139 08/21/2022   K 4.4 08/21/2022   CL 104 08/21/2022   CREATININE 1.49 (H) 08/21/2022   BUN 27 (H) 08/21/2022   CO2 28 08/21/2022   TSH 2.75 05/22/2022   HGBA1C 5.3 04/28/2009    DG Chest 2 View  Result Date: 05/30/2022 CLINICAL DATA:  Shortness of breath and chest pain EXAM: CHEST - 2 VIEW COMPARISON:  02/07/2021 FINDINGS: Chronic cardiomegaly and aortic atherosclerosis. Chronic lung markings with areas of linear scarring. No sign of active infiltrate, collapse or effusion. No frank pulmonary edema. No acute bone finding. IMPRESSION: Cardiomegaly. Aortic atherosclerosis. Chronic interstitial lung markings and pulmonary scarring. No active process identified.  Electronically Signed   By: Nelson Chimes M.D.   On: 05/30/2022 15:27    Assessment & Plan:   [place] was seen today for annual exam, hypertension and hypothyroidism.  Diagnoses and all orders for this visit:  Essential hypertension- Her blood pressure is well-controlled. -     Urinalysis, Routine w reflex microscopic; Future -     Hepatic function panel; Future -  Basic metabolic panel; Future -     Basic metabolic panel -     Hepatic function panel -     Urinalysis, Routine w reflex microscopic  Other specified hypothyroidism- She is euthyroid. -     Hepatic function panel; Future -     Hepatic function panel  Parathyroid hormone excess ([place])- Calcium is normal but PTH is elevated.  Will continue to monitor. -     VITAMIN D 25 Hydroxy (Vit-D Deficiency, Fractures); Future -     Basic metabolic panel; Future -     PTH, intact and calcium; Future -     PTH, intact and calcium -     Basic metabolic panel -     VITAMIN D 25 Hydroxy (Vit-D Deficiency, Fractures)  Kidney disease, chronic, stage IV (GFR 15-29 ml/min) (HCC)- Her renal function is stable. -     Urinalysis, Routine w reflex microscopic; Future -     Urinalysis, Routine w reflex microscopic  Encounter for general adult medical examination with abnormal findings- Exam completed, labs reviewed, vaccines reviewed and updated, no cancer screenings indicated, patient education was given.  Estrogen deficiency -     DG Bone Density; Future   I am having Dickie H. Majkowski maintain her albuterol, vitamin C, multivitamin, b complex vitamins, famotidine, ipratropium-albuterol, mometasone, pregabalin, Vilazodone HCl, Fluticasone-Umeclidin-Vilant, pravastatin, irbesartan, liothyronine, buPROPion, allopurinol, omega-3 acid ethyl esters, Jardiance, and oxyCODONE.  No orders of the defined types were placed in this encounter.    Follow-up: Return in about 6 months (around 02/20/2023).  Scarlette Calico, MD

## 2022-08-21 NOTE — Patient Instructions (Signed)

## 2022-08-23 LAB — PTH, INTACT AND CALCIUM
Calcium: 9.5 mg/dL (ref 8.6–10.4)
PTH: 85 pg/mL — ABNORMAL HIGH (ref 16–77)

## 2022-08-24 ENCOUNTER — Encounter: Payer: Self-pay | Admitting: Internal Medicine

## 2022-08-24 ENCOUNTER — Other Ambulatory Visit: Payer: Self-pay | Admitting: Internal Medicine

## 2022-08-24 DIAGNOSIS — I10 Essential (primary) hypertension: Secondary | ICD-10-CM

## 2022-08-25 ENCOUNTER — Encounter: Payer: Self-pay | Admitting: Internal Medicine

## 2022-08-26 MED ORDER — BOOSTRIX 5-2.5-18.5 LF-MCG/0.5 IM SUSP: 0.5000 mL | Freq: Once | INTRAMUSCULAR | 0 refills | Status: AC

## 2022-09-12 ENCOUNTER — Other Ambulatory Visit: Payer: Self-pay | Admitting: Internal Medicine

## 2022-09-12 DIAGNOSIS — F418 Other specified anxiety disorders: Secondary | ICD-10-CM

## 2022-10-08 DIAGNOSIS — M47816 Spondylosis without myelopathy or radiculopathy, lumbar region: Secondary | ICD-10-CM | POA: Diagnosis not present

## 2022-10-22 DIAGNOSIS — M47816 Spondylosis without myelopathy or radiculopathy, lumbar region: Secondary | ICD-10-CM | POA: Diagnosis not present

## 2022-10-23 ENCOUNTER — Other Ambulatory Visit: Payer: Self-pay | Admitting: Internal Medicine

## 2022-10-23 ENCOUNTER — Encounter: Payer: Self-pay | Admitting: Internal Medicine

## 2022-10-23 DIAGNOSIS — M170 Bilateral primary osteoarthritis of knee: Secondary | ICD-10-CM

## 2022-10-23 DIAGNOSIS — M48062 Spinal stenosis, lumbar region with neurogenic claudication: Secondary | ICD-10-CM

## 2022-10-23 DIAGNOSIS — M5136 Other intervertebral disc degeneration, lumbar region: Secondary | ICD-10-CM

## 2022-10-23 MED ORDER — OXYCODONE HCL 5 MG PO TABS: 5.0000 mg | ORAL_TABLET | ORAL | 0 refills | Status: DC | PRN

## 2022-11-22 ENCOUNTER — Other Ambulatory Visit: Payer: Self-pay | Admitting: Internal Medicine

## 2022-11-22 DIAGNOSIS — I10 Essential (primary) hypertension: Secondary | ICD-10-CM

## 2022-11-22 DIAGNOSIS — M1A39X0 Chronic gout due to renal impairment, multiple sites, without tophus (tophi): Secondary | ICD-10-CM

## 2022-11-22 DIAGNOSIS — E785 Hyperlipidemia, unspecified: Secondary | ICD-10-CM

## 2022-11-22 DIAGNOSIS — E038 Other specified hypothyroidism: Secondary | ICD-10-CM

## 2022-11-22 DIAGNOSIS — F418 Other specified anxiety disorders: Secondary | ICD-10-CM

## 2022-12-04 DIAGNOSIS — M47816 Spondylosis without myelopathy or radiculopathy, lumbar region: Secondary | ICD-10-CM | POA: Diagnosis not present

## 2022-12-04 DIAGNOSIS — M48062 Spinal stenosis, lumbar region with neurogenic claudication: Secondary | ICD-10-CM | POA: Diagnosis not present

## 2022-12-12 ENCOUNTER — Telehealth: Payer: Self-pay

## 2022-12-12 NOTE — Telephone Encounter (Signed)
Contacted Leslie Edwards to schedule their annual wellness visit. Appointment made for 12/20/22.  Norton Blizzard, [place] (AAMA)  [place] [facility] [phone]

## 2022-12-24 ENCOUNTER — Ambulatory Visit (INDEPENDENT_AMBULATORY_CARE_PROVIDER_SITE_OTHER): Payer: Medicare Other

## 2022-12-24 ENCOUNTER — Telehealth: Payer: Self-pay

## 2022-12-24 VITALS — Ht 63.5 in | Wt 233.0 lb

## 2022-12-24 DIAGNOSIS — Z0000 Encounter for general adult medical examination without abnormal findings: Secondary | ICD-10-CM | POA: Diagnosis not present

## 2022-12-24 NOTE — Progress Notes (Signed)
I connected with  Leslie Edwards on 12/24/22 by a audio enabled telemedicine application and verified that I am speaking with the correct person using two identifiers.  Patient Location: Home  Provider Location: Office/Clinic  I discussed the limitations of evaluation and management by telemedicine. The patient expressed understanding and agreed to proceed.  Patient Medicare AWV questionnaire was completed by the patient on 12/21/2022; I have confirmed that all information answered by patient is correct and no changes since this date.    Subjective:   Leslie Edwards is a 77 y.o. female who presents for Medicare Annual (Subsequent) preventive examination.  Review of Systems     Cardiac Risk Factors include: advanced age (>76men, >69 women);dyslipidemia;family history of premature cardiovascular disease;hypertension;obesity (BMI >30kg/m2);sedentary lifestyle     Objective:    Today's Vitals   12/24/22 1405 12/24/22 1406  Weight: 233 lb (105.7 kg)   Height: 5' 3.5" (1.613 m)   PainSc: 6  6   PainLoc: Back    Body mass index is 40.62 kg/m.     12/24/2022    2:08 PM 05/30/2022    2:28 PM 12/18/2021    2:41 PM 12/22/2020   11:31 AM 05/01/2018    5:28 PM  Advanced Directives  Does Patient Have a Medical Advance Directive? Yes No Yes Yes Yes  Type of Estate agent of [place];Living will  Healthcare Power of [employer] of [place];Living will  Copy of Healthcare Power of Attorney in Chart? No - copy requested  No - copy requested  No - copy requested  Would patient like information on creating a medical advance directive?  No - Patient declined       Current Medications (verified) Outpatient Encounter Medications as of 12/24/2022  Medication Sig   albuterol (PROVENTIL HFA;VENTOLIN HFA) 108 (90 Base) MCG/ACT inhaler Inhale 2 puffs into the lungs every 6 (six) hours as needed.   allopurinol (ZYLOPRIM) 100 MG tablet Take 1 tablet (100 mg total) by  mouth daily.   Ascorbic Acid (VITAMIN C) 1000 MG tablet Take 1,000 mg by mouth daily.   b complex vitamins tablet Take 1 tablet by mouth daily.   buPROPion (WELLBUTRIN XL) 150 MG 24 hr tablet Take 3 tablets (450 mg total) by mouth daily.   famotidine (PEPCID) 10 MG tablet Take 10 mg by mouth 2 (two) times daily as needed for heartburn or indigestion.   Fluticasone-Umeclidin-Vilant (TRELEGY ELLIPTA) 100-62.5-25 MCG/ACT AEPB Inhale 1 puff into the lungs daily.   ipratropium-albuterol (DUONEB) 0.5-2.5 (3) MG/3ML SOLN Take 3 mLs by nebulization as needed.   irbesartan (AVAPRO) 150 MG tablet Take 1 tablet (150 mg total) by mouth daily.   JARDIANCE 10 MG TABS tablet Take 1 tablet (10 mg total) by mouth daily before breakfast.   liothyronine (CYTOMEL) 25 MCG tablet Take 1 tablet (25 mcg total) by mouth daily.   mometasone (NASONEX) 50 MCG/ACT nasal spray Place 2 sprays into the nose as needed.   Multiple Vitamin (MULTIVITAMIN) tablet Take 1 tablet by mouth daily.   omega-3 acid ethyl esters (LOVAZA) 1 g capsule TAKE TWO CAPSULES BY MOUTH TWICE DAILY   oxyCODONE (OXY IR/ROXICODONE) 5 MG immediate release tablet Take 1 tablet (5 mg total) by mouth every 4 (four) hours as needed for severe pain.   pravastatin (PRAVACHOL) 20 MG tablet Take 1 tablet (20 mg total) by mouth daily.   pregabalin (LYRICA) 75 MG capsule Take 75 mg by mouth 3 (three) times daily. Takes 2-3  times daily as needed   Vilazodone HCl (VIIBRYD) 40 MG TABS TAKE ONE TABLET ONCE DAILY   No facility-administered encounter medications on file as of 12/24/2022.    Allergies (verified) Penicillins and Prochlorperazine edisylate   History: Past Medical History:  Diagnosis Date   Allergy    ANEMIA, B12 DEFICIENCY 01/28/2009   Qualifier: Diagnosis of  By: Lovell SheehanJenkins MD, Balinda QuailsJohn E    Anxiety    Asthma    GERD (gastroesophageal reflux disease)    Hypertension    Hypothyroidism    OSTEOPOROSIS 05/06/2007   Qualifier: Diagnosis of  By: Lovell SheehanJenkins  MD, John E    Parathyroid hormone excess 08/31/2013   Secondary to low dietary calcium/calcium malabsorption    PLANTAR FASCIITIS, LEFT 07/05/2009   Qualifier: Diagnosis of  By: Lovell SheehanJenkins MD, Balinda QuailsJohn E    Rosacea 02/25/2009   Qualifier: Diagnosis of  By: Lovell SheehanJenkins MD, John E    [place]    UNSPECIFIED IRON DEFICIENCY ANEMIA 01/28/2009   Qualifier: Diagnosis of  By: Lovell SheehanJenkins MD, John E    VENOUS INSUFFICIENCY, CHRONIC 04/28/2007   Qualifier: Diagnosis of  By: Everett Graffodd, RN, Ellen     Past Surgical History:  Procedure Laterality Date   BREAST BIOPSY Left    BREAST EXCISIONAL BIOPSY Right    CARPAL TUNNEL RELEASE     KNEE ARTHROSCOPY     right   LIPOMA EXCISION  1965   axilary   nissan fundiplication     TONSILLECTOMY     Family History  Problem Relation Age of Onset   Cancer Mother        lung   Macular degeneration Father    Arthritis Father    Lymphoma Son        died age 78   Dementia Maternal Grandmother    Stroke Maternal Grandmother    Healthy Brother    Cancer Maternal Grandfather        esophageal cancer   Cancer Paternal Grandmother        leukemia   Healthy Brother    Cancer Maternal Uncle        colon   Cancer Maternal Aunt        esophageal cancer   Social History   Socioeconomic History   Marital status: Married    Spouse name: Not on file   Number of children: 1   Years of education: Not on file   Highest education level: Not on file  Occupational History   Not on file  Tobacco Use   Smoking status: Former    Types: Cigarettes    Quit date: 01/22/1975    Years since quitting: 47.9   Smokeless tobacco: Never  Vaping Use   Vaping Use: Never used  Substance and Sexual Activity   Alcohol use: Yes    Alcohol/week: 3.0 standard drinks of alcohol    Types: 3 Glasses of wine per week   Drug use: No   Sexual activity: Not Currently  Other Topics Concern   Not on file  Social History Narrative   Regular exercise: Not walking   Works 3 days per week at [employer]   Caffeine use: yes   Social Determinants of Health   Financial Resource Strain: Medium Risk (12/24/2022)   Overall Financial Resource Strain (CARDIA)    Difficulty of Paying Living Expenses: Somewhat hard  Food Insecurity: Patient Declined (12/24/2022)   Hunger Vital Sign    Worried About Running Out of  Food in the Last Year: Patient declined    Ran Out of Food in the Last Year: Patient declined  Transportation Needs: No Transportation Needs (12/24/2022)   PRAPARE - Administrator, Civil Service (Medical): No    Lack of Transportation (Non-Medical): No  Physical Activity: Inactive (12/24/2022)   Exercise Vital Sign    Days of Exercise per Week: 0 days    Minutes of Exercise per Session: 0 min  Stress: Stress Concern Present (12/24/2022)   [employer] of Occupational Health - Occupational Stress Questionnaire    Feeling of Stress : Very much  Social Connections: Unknown (12/24/2022)   Social Connection and Isolation Panel [NHANES]    Frequency of Communication with Friends and Family: More than three times a week    Frequency of Social Gatherings with Friends and Family: Once a week    Attends Religious Services: Not on Marketing executive or Organizations: Yes    Attends Banker Meetings: Never    Marital Status: Married    Tobacco Counseling Counseling given: Not Answered   Clinical Intake:  Pre-visit preparation completed: Yes  Pain : 0-10 Pain Score: 6  Pain Type: Chronic pain        How often do you need to have someone help you when you read instructions, pamphlets, or other written materials from your doctor or pharmacy?: 1 - Never What is the last grade level you completed in school?: HSG  Diabetic? No  Interpreter Needed?: No  Information entered by :: Susie Cassette, LPN.   Activities of Daily Living    12/24/2022    2:24 PM 12/21/2022   10:45 AM  In your present state of health, do you have  any difficulty performing the following activities:  Hearing? 0 0  Vision? 0 0  Difficulty concentrating or making decisions? 0 0  Walking or climbing stairs? 1 1  Dressing or bathing? 0 0  Doing errands, shopping? 1 1  Preparing Food and eating ? N Y  Using the Toilet? N N  In the past six months, have you accidently leaked urine? Y Y  Do you have problems with loss of bowel control? N N  Managing your Medications? N N  Managing your Finances? N N  Housekeeping or managing your Housekeeping? Malvin Johns    Patient Care Team: Etta Grandchild, MD as PCP - General (Internal Medicine) Tonny Bollman, MD as PCP - Cardiology (Cardiology) Szabat, Vinnie Level, [facility] (Inactive) as Pharmacist (Pharmacist)  Indicate any recent Medical Services you may have received from other than [facility] providers in the past year (date may be approximate).     Assessment:   This is a routine wellness examination for Mirela.  Hearing/Vision screen Hearing Screening - Comments:: Denies hearing difficulties   Vision Screening - Comments:: Wears rx glasses - up to date with routine eye exams with Dr. Hillery Aldo   Dietary issues and exercise activities discussed: Current Exercise Habits: The patient does not participate in regular exercise at present, Exercise limited by: orthopedic condition(s);respiratory conditions(s);psychological condition(s);neurologic condition(s);cardiac condition(s)   Goals Addressed             This Visit's Progress    My goal is to maintain my health and stay independent.        Depression Screen    12/24/2022    2:10 PM 08/21/2022   10:12 AM 12/18/2021    2:42 PM 12/18/2021    2:40 PM  11/15/2021   10:22 AM 08/16/2021   10:23 AM 12/01/2020   11:22 AM  PHQ 2/9 Scores  PHQ - 2 Score 4 0 0 0 1 1 5   PHQ- 9 Score 7 0   1 1 17     Fall Risk    12/24/2022    2:23 PM 12/21/2022   10:45 AM 12/18/2021    2:42 PM 01/26/2021   11:19 AM 12/01/2020   11:22 AM  Fall Risk   Falls in the  past year? 1 1 1  0 0  Number falls in past yr: 1 1 0  1  Comment   back injury    Injury with Fall? 0 0 1  0  Risk for fall due to :   History of fall(s)    Follow up Falls evaluation completed;Education provided  Falls evaluation completed;Education provided      FALL RISK PREVENTION PERTAINING TO THE HOME:  Any stairs in or around the home? Yes  If so, are there any without handrails? No  Home free of loose throw rugs in walkways, pet beds, electrical cords, etc? Yes  Adequate lighting in your home to reduce risk of falls? Yes   ASSISTIVE DEVICES UTILIZED TO PREVENT FALLS:  Life alert? No  Use of a cane, walker or w/c? Yes  Grab bars in the bathroom? Yes  Shower chair or bench in shower? Yes  Elevated toilet seat or a handicapped toilet? Yes   TIMED UP AND GO:  Was the test performed? No . Telephonic Visit   Cognitive Function:        12/24/2022    2:25 PM  6CIT Screen  What Year? 0 points  What month? 0 points  What time? 0 points  Count back from 20 0 points  Months in reverse 0 points  Repeat phrase 0 points  Total Score 0 points    Immunizations Immunization History  Administered Date(s) Administered   Fluad Quad(high Dose 65+) 06/30/2019   Influenza Split 07/23/2011, 06/24/2012   Influenza Whole 07/18/2007, 06/14/2008, 06/13/2010   Influenza, High Dose Seasonal PF 07/24/2013, 06/12/2016, 07/02/2017, 07/03/2018   Influenza,inj,Quad PF,6+ Mos 06/03/2014, 06/30/2015   Influenza-Unspecified 07/20/2020, 06/29/2021, 07/05/2022   Moderna SARS-COV2 Booster Vaccination 06/17/2021   PFIZER(Purple Top)SARS-COV-2 Vaccination 10/07/2019, 10/28/2019, 07/02/2020   Pneumococcal Conjugate-13 07/24/2013   Pneumococcal Polysaccharide-23 06/30/2015, 11/09/2020   Td 12/17/1998   Tdap 06/24/2012   Zoster Recombinat (Shingrix) 03/18/2018, 08/07/2018   Zoster, Live 05/01/2011    TDAP status: Due, Education has been provided regarding the importance of this vaccine. Advised  may receive this vaccine at local pharmacy or Health Dept. Aware to provide a copy of the vaccination record if obtained from local pharmacy or Health Dept. Verbalized acceptance and understanding.  Flu Vaccine status: Up to date  Pneumococcal vaccine status: Up to date  Covid-19 vaccine status: Completed vaccines  Qualifies for Shingles Vaccine? Yes   Zostavax completed Yes   Shingrix Completed?: Yes  Screening Tests Health Maintenance  Topic Date Due   DTaP/Tdap/Td (3 - Td or Tdap) 06/24/2022   COVID-19 Vaccine (5 - 2023-24 season) 05/23/2023 (Originally 05/18/2022)   INFLUENZA VACCINE  04/18/2023   Medicare Annual Wellness (AWV)  12/24/2023   Pneumonia Vaccine 75+ Years old  Completed   DEXA SCAN  Completed   Hepatitis C Screening  Completed   Zoster Vaccines- Shingrix  Completed   HPV VACCINES  Aged Out   COLONOSCOPY (Pts 45-31yrs Insurance coverage will need to be confirmed)  Discontinued  Health Maintenance  Health Maintenance Due  Topic Date Due   DTaP/Tdap/Td (3 - Td or Tdap) 06/24/2022    Colorectal cancer screening: No longer required.   Mammogram status: No longer required due to patient refusal.  Bone Density status: Ordered 08/21/2022. Pt provided with contact info and advised to call to schedule appt.  Lung Cancer Screening: (Low Dose CT Chest recommended if Age 50-80 years, 30 pack-year currently smoking OR have quit w/in 15years.) does not qualify.   Lung Cancer Screening Referral: no  Additional Screening:  Hepatitis C Screening: does qualify; Completed 10/14/2019  Vision Screening: Recommended annual ophthalmology exams for early detection of glaucoma and other disorders of the eye. Is the patient up to date with their annual eye exam?  Yes  Who is the provider or what is the name of the office in which the patient attends annual eye exams? Hillery Aldo, MD. If pt is not established with a provider, would they like to be referred to a provider  to establish care? No .   Dental Screening: Recommended annual dental exams for proper oral hygiene  Community Resource Referral / Chronic Care Management: CRR required this visit?  No   CCM required this visit?  No      Plan:     I have personally reviewed and noted the following in the patient's chart:   Medical and social history Use of alcohol, tobacco or illicit drugs  Current medications and supplements including opioid prescriptions. Patient is currently taking opioid prescriptions. Information provided to patient regarding non-opioid alternatives. Patient advised to discuss non-opioid treatment plan with their provider. Functional ability and status Nutritional status Physical activity Advanced directives List of other physicians Hospitalizations, surgeries, and ER visits in previous 12 months Vitals Screenings to include cognitive, depression, and falls Referrals and appointments  In addition, I have reviewed and discussed with patient certain preventive protocols, quality metrics, and best practice recommendations. A written personalized care plan for preventive services as well as general preventive health recommendations were provided to patient.     Mickeal Needy, LPN   [id number]   Nurse Notes:  Normal cognitive status assessed by direct observation via phone by this Nurse Health Advisor. No abnormalities found.   Patient Medicare AWV questionnaire was completed by the patient on 12/21/2022; I have confirmed that all information answered by patient is correct and no changes since this date.

## 2022-12-24 NOTE — Telephone Encounter (Signed)
Patient checking on status of bone density order that was placed 08/21/2022 to [facility].  Please schedule patient and inform patient of appt time and date.  Takisha Pelle N. Morrison Mcbryar, LPN. [facility] AWV Team Direct Dial: [phone]

## 2022-12-24 NOTE — Patient Instructions (Signed)
Leslie Edwards , Thank you for taking time to come for your Medicare Wellness Visit. I appreciate your ongoing commitment to your health goals. Please review the following plan we discussed and let me know if I can assist you in the future.   These are the goals we discussed:  Goals      My goal is to maintain my health and stay independent.        This is a list of the screening recommended for you and due dates:  Health Maintenance  Topic Date Due   DTaP/Tdap/Td vaccine (3 - Td or Tdap) 06/24/2022   COVID-19 Vaccine (5 - 2023-24 season) 05/23/2023*   Flu Shot  04/18/2023   Medicare Annual Wellness Visit  12/24/2023   Pneumonia Vaccine  Completed   DEXA scan (bone density measurement)  Completed   Hepatitis C Screening: USPSTF Recommendation to screen - Ages 48-79 yo.  Completed   Zoster (Shingles) Vaccine  Completed   HPV Vaccine  Aged Out   Colon Cancer Screening  Discontinued  *Topic was postponed. The date shown is not the original due date.    Advanced directives: Yes  Conditions/risks identified: Yes  Next appointment: Follow up in one year for your annual wellness visit.   Preventive Care 67 Years and Older, Female Preventive care refers to lifestyle choices and visits with your health care provider that can promote health and wellness. What does preventive care include? A yearly physical exam. This is also called an annual well check. Dental exams once or twice a year. Routine eye exams. Ask your health care provider how often you should have your eyes checked. Personal lifestyle choices, including: Daily care of your teeth and gums. Regular physical activity. Eating a healthy diet. Avoiding tobacco and drug use. Limiting alcohol use. Practicing safe sex. Taking low-dose aspirin every day. Taking vitamin and mineral supplements as recommended by your health care provider. What happens during an annual well check? The services and screenings done by your health  care provider during your annual well check will depend on your age, overall health, lifestyle risk factors, and family history of disease. Counseling  Your health care provider may ask you questions about your: Alcohol use. Tobacco use. Drug use. Emotional well-being. Home and relationship well-being. Sexual activity. Eating habits. History of falls. Memory and ability to understand (cognition). Work and work Astronomer. Reproductive health. Screening  You may have the following tests or measurements: Height, weight, and BMI. Blood pressure. Lipid and cholesterol levels. These may be checked every 5 years, or more frequently if you are over 56 years old. Skin check. Lung cancer screening. You may have this screening every year starting at age 64 if you have a 30-pack-year history of smoking and currently smoke or have quit within the past 15 years. Fecal occult blood test (FOBT) of the stool. You may have this test every year starting at age 56. Flexible sigmoidoscopy or colonoscopy. You may have a sigmoidoscopy every 5 years or a colonoscopy every 10 years starting at age 39. Hepatitis C blood test. Hepatitis B blood test. Sexually transmitted disease (STD) testing. Diabetes screening. This is done by checking your blood sugar (glucose) after you have not eaten for a while (fasting). You may have this done every 1-3 years. Bone density scan. This is done to screen for osteoporosis. You may have this done starting at age 84. Mammogram. This may be done every 1-2 years. Talk to your health care provider about how often  you should have regular mammograms. Talk with your health care provider about your test results, treatment options, and if necessary, the need for more tests. Vaccines  Your health care provider may recommend certain vaccines, such as: Influenza vaccine. This is recommended every year. Tetanus, diphtheria, and acellular pertussis (Tdap, Td) vaccine. You may need a Td  booster every 10 years. Zoster vaccine. You may need this after age 15. Pneumococcal 13-valent conjugate (PCV13) vaccine. One dose is recommended after age 18. Pneumococcal polysaccharide (PPSV23) vaccine. One dose is recommended after age 32. Talk to your health care provider about which screenings and vaccines you need and how often you need them. This information is not intended to replace advice given to you by your health care provider. Make sure you discuss any questions you have with your health care provider. Document Released: 09/30/2015 Document Revised: 05/23/2016 Document Reviewed: 07/05/2015 Elsevier Interactive Patient Education  2017 [place] [employer] Prevention in the Home Falls can cause injuries. They can happen to people of all ages. There are many things you can do to make your home safe and to help prevent falls. What can I do on the outside of my home? Regularly fix the edges of walkways and driveways and fix any cracks. Remove anything that might make you trip as you walk through a door, such as a raised step or threshold. Trim any bushes or trees on the path to your home. Use bright outdoor lighting. Clear any walking paths of anything that might make someone trip, such as rocks or tools. Regularly check to see if handrails are loose or broken. Make sure that both sides of any steps have handrails. Any raised decks and porches should have guardrails on the edges. Have any leaves, snow, or ice cleared regularly. Use sand or salt on walking paths during winter. Clean up any spills in your garage right away. This includes oil or grease spills. What can I do in the bathroom? Use night lights. Install grab bars by the toilet and in the tub and shower. Do not use towel bars as grab bars. Use non-skid mats or decals in the tub or shower. If you need to sit down in the shower, use a plastic, non-slip stool. Keep the floor dry. Clean up any water that spills on the floor  as soon as it happens. Remove soap buildup in the tub or shower regularly. Attach bath mats securely with double-sided non-slip rug tape. Do not have throw rugs and other things on the floor that can make you trip. What can I do in the bedroom? Use night lights. Make sure that you have a light by your bed that is easy to reach. Do not use any sheets or blankets that are too big for your bed. They should not hang down onto the floor. Have a firm chair that has side arms. You can use this for support while you get dressed. Do not have throw rugs and other things on the floor that can make you trip. What can I do in the kitchen? Clean up any spills right away. Avoid walking on wet floors. Keep items that you use a lot in easy-to-reach places. If you need to reach something above you, use a strong step stool that has a grab bar. Keep electrical cords out of the way. Do not use floor polish or wax that makes floors slippery. If you must use wax, use non-skid floor wax. Do not have throw rugs and other things on  the floor that can make you trip. What can I do with my stairs? Do not leave any items on the stairs. Make sure that there are handrails on both sides of the stairs and use them. Fix handrails that are broken or loose. Make sure that handrails are as long as the stairways. Check any carpeting to make sure that it is firmly attached to the stairs. Fix any carpet that is loose or worn. Avoid having throw rugs at the top or bottom of the stairs. If you do have throw rugs, attach them to the floor with carpet tape. Make sure that you have a light switch at the top of the stairs and the bottom of the stairs. If you do not have them, ask someone to add them for you. What else can I do to help prevent falls? Wear shoes that: Do not have high heels. Have rubber bottoms. Are comfortable and fit you well. Are closed at the toe. Do not wear sandals. If you use a stepladder: Make sure that it is  fully opened. Do not climb a closed stepladder. Make sure that both sides of the stepladder are locked into place. Ask someone to hold it for you, if possible. Clearly mark and make sure that you can see: Any grab bars or handrails. First and last steps. Where the edge of each step is. Use tools that help you move around (mobility aids) if they are needed. These include: Canes. Walkers. Scooters. Crutches. Turn on the lights when you go into a dark area. Replace any light bulbs as soon as they burn out. Set up your furniture so you have a clear path. Avoid moving your furniture around. If any of your floors are uneven, fix them. If there are any pets around you, be aware of where they are. Review your medicines with your doctor. Some medicines can make you feel dizzy. This can increase your chance of falling. Ask your doctor what other things that you can do to help prevent falls. This information is not intended to replace advice given to you by your health care provider. Make sure you discuss any questions you have with your health care provider. Document Released: 06/30/2009 Document Revised: 02/09/2016 Document Reviewed: 10/08/2014 Elsevier Interactive Patient Education  2017 [employer].

## 2023-01-01 DIAGNOSIS — M48062 Spinal stenosis, lumbar region with neurogenic claudication: Secondary | ICD-10-CM | POA: Diagnosis not present

## 2023-01-14 ENCOUNTER — Encounter: Payer: Self-pay | Admitting: Cardiovascular Disease

## 2023-01-14 ENCOUNTER — Ambulatory Visit: Payer: Medicare Other | Attending: Cardiovascular Disease | Admitting: Cardiovascular Disease

## 2023-01-14 VITALS — BP 128/82 | HR 90 | Ht 63.25 in | Wt 223.4 lb

## 2023-01-14 DIAGNOSIS — I10 Essential (primary) hypertension: Secondary | ICD-10-CM

## 2023-01-14 DIAGNOSIS — I700 Atherosclerosis of aorta: Secondary | ICD-10-CM

## 2023-01-14 DIAGNOSIS — I5032 Chronic diastolic (congestive) heart failure: Secondary | ICD-10-CM | POA: Diagnosis not present

## 2023-01-14 DIAGNOSIS — N184 Chronic kidney disease, stage 4 (severe): Secondary | ICD-10-CM

## 2023-01-14 DIAGNOSIS — R0609 Other forms of dyspnea: Secondary | ICD-10-CM | POA: Diagnosis not present

## 2023-01-14 DIAGNOSIS — Z6841 Body Mass Index (BMI) 40.0 and over, adult: Secondary | ICD-10-CM

## 2023-01-14 NOTE — Progress Notes (Signed)
Cardiology Office Note:    Date:  01/14/2023   ID:  KEON BENSCOTER, DOB Nov 23, 1945, MRN [id number]  PCP:  Etta Grandchild, MD   [facility] Providers Cardiologist:  Tonny Bollman, MD     Referring MD: Etta Grandchild, MD   Chief Complaint  Patient presents with   Shortness of Breath    History of Present Illness:    Leslie Edwards is a 77 y.o. female with a hx of hypertension, obesity, exertional dyspnea, and chronic kidney disease.  She presents for follow-up evaluation today.  The patient is here alone as her husband is currently hospitalized after a stroke.  The patient has been getting along pretty well besides the stress of dealing with her husband's illness.  She states that her breathing is stable as long as she does not do too much physical activity.  No orthopnea, PND, chest pain, or chest pressure.  Past Medical History:  Diagnosis Date   Allergy    ANEMIA, B12 DEFICIENCY 01/28/2009   Qualifier: Diagnosis of  By: Lovell Sheehan MD, Balinda Quails    Anxiety    Asthma    GERD (gastroesophageal reflux disease)    Hypertension    Hypothyroidism    OSTEOPOROSIS 05/06/2007   Qualifier: Diagnosis of  By: Lovell Sheehan MD, John E    Parathyroid hormone excess (HCC) 08/31/2013   Secondary to low dietary calcium/calcium malabsorption    PLANTAR FASCIITIS, LEFT 07/05/2009   Qualifier: Diagnosis of  By: Lovell Sheehan MD, Balinda Quails    Rosacea 02/25/2009   Qualifier: Diagnosis of  By: Lovell Sheehan MD, John E    [place]    UNSPECIFIED IRON DEFICIENCY ANEMIA 01/28/2009   Qualifier: Diagnosis of  By: Lovell Sheehan MD, John E    VENOUS INSUFFICIENCY, CHRONIC 04/28/2007   Qualifier: Diagnosis of  By: Everett Graff      Past Surgical History:  Procedure Laterality Date   BREAST BIOPSY Left    BREAST EXCISIONAL BIOPSY Right    CARPAL TUNNEL RELEASE     KNEE ARTHROSCOPY     right   LIPOMA EXCISION  1965   axilary   nissan fundiplication     TONSILLECTOMY      Current Medications: Current Meds   Medication Sig   albuterol (PROVENTIL HFA;VENTOLIN HFA) 108 (90 Base) MCG/ACT inhaler Inhale 2 puffs into the lungs every 6 (six) hours as needed.   allopurinol (ZYLOPRIM) 100 MG tablet Take 1 tablet (100 mg total) by mouth daily.   Ascorbic Acid (VITAMIN C) 1000 MG tablet Take 1,000 mg by mouth daily.   b complex vitamins tablet Take 1 tablet by mouth daily.   buPROPion (WELLBUTRIN XL) 150 MG 24 hr tablet Take 3 tablets (450 mg total) by mouth daily.   famotidine (PEPCID) 10 MG tablet Take 10 mg by mouth 2 (two) times daily as needed for heartburn or indigestion.   Fluticasone-Umeclidin-Vilant (TRELEGY ELLIPTA) 100-62.5-25 MCG/ACT AEPB Inhale 1 puff into the lungs daily.   ipratropium-albuterol (DUONEB) 0.5-2.5 (3) MG/3ML SOLN Take 3 mLs by nebulization as needed.   irbesartan (AVAPRO) 150 MG tablet Take 1 tablet (150 mg total) by mouth daily.   JARDIANCE 10 MG TABS tablet Take 1 tablet (10 mg total) by mouth daily before breakfast.   liothyronine (CYTOMEL) 25 MCG tablet Take 1 tablet (25 mcg total) by mouth daily.   mometasone (NASONEX) 50 MCG/ACT nasal spray Place 2 sprays into the nose as needed.   Multiple Vitamin (MULTIVITAMIN) tablet Take 1 tablet  by mouth daily.   omega-3 acid ethyl esters (LOVAZA) 1 g capsule TAKE TWO CAPSULES BY MOUTH TWICE DAILY   oxyCODONE (OXY IR/ROXICODONE) 5 MG immediate release tablet Take 1 tablet (5 mg total) by mouth every 4 (four) hours as needed for severe pain.   pravastatin (PRAVACHOL) 20 MG tablet Take 1 tablet (20 mg total) by mouth daily.   pregabalin (LYRICA) 75 MG capsule Take 75 mg by mouth 3 (three) times daily. Takes 2-3 times daily as needed   Vilazodone HCl (VIIBRYD) 40 MG TABS TAKE ONE TABLET ONCE DAILY     Allergies:   Penicillins and Prochlorperazine edisylate   Social History   Socioeconomic History   Marital status: Married    Spouse name: Not on file   Number of children: 1   Years of education: Not on file   Highest education  level: Not on file  Occupational History   Not on file  Tobacco Use   Smoking status: Former    Types: Cigarettes    Quit date: 01/22/1975    Years since quitting: 48.0   Smokeless tobacco: Never  Vaping Use   Vaping Use: Never used  Substance and Sexual Activity   Alcohol use: Yes    Alcohol/week: 3.0 standard drinks of alcohol    Types: 3 Glasses of wine per week   Drug use: No   Sexual activity: Not Currently  Other Topics Concern   Not on file  Social History Narrative   Regular exercise: Not walking   Works 3 days per week at [employer]   Caffeine use: yes   Social Determinants of Health   Financial Resource Strain: Medium Risk (12/24/2022)   Overall Financial Resource Strain (CARDIA)    Difficulty of Paying Living Expenses: Somewhat hard  Food Insecurity: Patient Declined (12/24/2022)   Hunger Vital Sign    Worried About Running Out of Food in the Last Year: Patient declined    Ran Out of Food in the Last Year: Patient declined  Transportation Needs: No Transportation Needs (12/24/2022)   PRAPARE - Administrator, Civil Service (Medical): No    Lack of Transportation (Non-Medical): No  Physical Activity: Inactive (12/24/2022)   Exercise Vital Sign    Days of Exercise per Week: 0 days    Minutes of Exercise per Session: 0 min  Stress: Stress Concern Present (12/24/2022)   [employer] of Occupational Health - Occupational Stress Questionnaire    Feeling of Stress : Very much  Social Connections: Unknown (12/24/2022)   Social Connection and Isolation Panel [NHANES]    Frequency of Communication with Friends and Family: More than three times a week    Frequency of Social Gatherings with Friends and Family: Once a week    Attends Religious Services: Not on Marketing executive or Organizations: Yes    Attends Banker Meetings: Never    Marital Status: Married     Family History: The patient's family history  includes Arthritis in her father; Cancer in her maternal aunt, maternal grandfather, maternal uncle, mother, and paternal grandmother; Dementia in her maternal grandmother; Healthy in her brother and brother; Lymphoma in her son; Macular degeneration in her father; Stroke in her maternal grandmother.  ROS:   Please see the history of present illness.    All other systems reviewed and are negative.  EKGs/Labs/Other Studies Reviewed:    The following studies were reviewed today: Cardiac  Studies & Procedures     STRESS TESTS  MYOCARDIAL PERFUSION IMAGING 05/25/2022  Narrative   Findings are consistent with ischemia. The study is low risk.   No ST deviation was noted.   LV perfusion is abnormal. Defect 1: There is a small defect with mild reduction in uptake present in the basal inferior, inferolateral and inferoseptal location(s) that is partially reversible. There is abnormal wall motion in the defect area. Consistent with ischemia.   Left ventricular function is normal. Nuclear stress EF: 55 %. The left ventricular ejection fraction is normal (55-65%). End diastolic cavity size is mildly enlarged. End systolic cavity size is normal.   Prior study not available for comparison.  Small area in the basal inferior, inferoseptal, and inferolateral wall with reduced perfusion at rest, slightly worse with stress. There is focal wall motion abnormalities in the inferoseptum with overall preserved EF. There is significant extracardiac activity, so artifact cannot be excluded, but with wall motion abnormality and decreased perfusion with stress, this suggests ischemia. Overall mild and small area, so low risk study.   ECHOCARDIOGRAM  ECHOCARDIOGRAM COMPLETE 07/12/2022  Narrative ECHOCARDIOGRAM REPORT    Patient Name:   Leslie Edwards Date of Exam: 07/12/2022 Medical Rec #:  [id number]       Height:       63.5 in Accession #:    [id number]      Weight:       242.6 lb Date of Birth:  15-Jul-1946        BSA:          2.111 m Patient Age:    76 years        BP:           118/64 mmHg Patient Gender: F               HR:           71 bpm. Exam Location:  [address]  Procedure: 2D Echo, Cardiac Doppler and Color Doppler  Indications:    R06.02 SOB  History:        Patient has prior history of Echocardiogram examinations, most recent 11/27/2018. Abnormal ECG, Signs/Symptoms:Murmur; Risk Factors:Hypertension and Dyslipidemia. LVH. Diastolic dysfunction. Asthma. Chronic kidney disease. Morbid obesity. Dyspnea on exertion.  Sonographer:    Cathie Beams RCS Referring Phys: [phone] Ngoc Detjen  IMPRESSIONS   1. Left ventricular ejection fraction, by estimation, is 55 to 60%. The left ventricle has normal function. The left ventricle has no regional wall motion abnormalities. Left ventricular diastolic parameters are consistent with Grade I diastolic dysfunction (impaired relaxation). 2. Right ventricular systolic function is normal. The right ventricular size is normal. Tricuspid regurgitation signal is inadequate for assessing PA pressure. 3. The mitral valve is degenerative. Trivial mitral valve regurgitation. No evidence of mitral stenosis. Moderate mitral annular calcification. 4. The aortic valve is tricuspid. Aortic valve regurgitation is not visualized. No aortic stenosis is present. 5. The inferior vena cava is normal in size with greater than 50% respiratory variability, suggesting right atrial pressure of 3 mmHg.  FINDINGS Left Ventricle: Left ventricular ejection fraction, by estimation, is 55 to 60%. The left ventricle has normal function. The left ventricle has no regional wall motion abnormalities. The left ventricular internal cavity size was normal in size. There is no left ventricular hypertrophy. Left ventricular diastolic parameters are consistent with Grade I diastolic dysfunction (impaired relaxation).  Right Ventricle: The right ventricular size is normal. No  increase in right ventricular wall thickness.  Right ventricular systolic function is normal. Tricuspid regurgitation signal is inadequate for assessing PA pressure.  Left Atrium: Left atrial size was normal in size.  Right Atrium: Right atrial size was normal in size.  Pericardium: There is no evidence of pericardial effusion.  Mitral Valve: The mitral valve is degenerative in appearance. There is mild calcification of the mitral valve leaflet(s). Moderate mitral annular calcification. Trivial mitral valve regurgitation. No evidence of mitral valve stenosis.  Tricuspid Valve: The tricuspid valve is normal in structure. Tricuspid valve regurgitation is not demonstrated.  Aortic Valve: The aortic valve is tricuspid. Aortic valve regurgitation is not visualized. No aortic stenosis is present.  Pulmonic Valve: The pulmonic valve was normal in structure. Pulmonic valve regurgitation is not visualized.  Aorta: The aortic root is normal in size and structure.  Venous: The inferior vena cava is normal in size with greater than 50% respiratory variability, suggesting right atrial pressure of 3 mmHg.  IAS/Shunts: No atrial level shunt detected by color flow Doppler.   LEFT VENTRICLE PLAX 2D LVIDd:         5.10 cm   Diastology LVIDs:         3.80 cm   LV e' medial:    6.96 cm/s LV PW:         1.20 cm   LV E/e' medial:  14.9 LV IVS:        1.00 cm   LV e' lateral:   8.38 cm/s LVOT diam:     2.10 cm   LV E/e' lateral: 12.4 LV SV:         105 LV SV Index:   50 LVOT Area:     3.46 cm   RIGHT VENTRICLE RV S prime:     14.90 cm/s TAPSE (M-mode): 1.5 cm  LEFT ATRIUM             Index LA diam:        4.10 cm 1.94 cm/m LA Vol (A2C):   44.7 ml 21.17 ml/m LA Vol (A4C):   47.1 ml 22.31 ml/m LA Biplane Vol: 47.0 ml 22.26 ml/m AORTIC VALVE LVOT Vmax:   145.00 cm/s LVOT Vmean:  88.800 cm/s LVOT VTI:    0.302 m  AORTA Ao Root diam: 3.50 cm Ao Asc diam:  3.40 cm  MITRAL VALVE MV Area  (PHT): 2.01 cm     SHUNTS MV Decel Time: 377 msec     Systemic VTI:  0.30 m MV E velocity: 104.00 cm/s  Systemic Diam: 2.10 cm MV A velocity: 140.00 cm/s MV E/A ratio:  0.74  Dalton McleanMD Electronically signed by Wilfred Lacy Signature Date/Time: 07/12/2022/4:29:20 PM    Final              EKG:  EKG is not ordered today.    Recent Labs: 05/22/2022: Pro B Natriuretic peptide (BNP) 79.0; TSH 2.75 05/30/2022: B Natriuretic Peptide 30.0; Hemoglobin 13.0; Platelets 236 08/21/2022: ALT 19; BUN 27; Creatinine, Ser 1.49; Potassium 4.4; Sodium 139  Recent Lipid Panel    Component Value Date/Time   CHOL 174 02/15/2022 1004   TRIG 140.0 02/15/2022 1004   HDL 75.00 02/15/2022 1004   CHOLHDL 2 02/15/2022 1004   VLDL 28.0 02/15/2022 1004   LDLCALC 71 02/15/2022 1004   LDLDIRECT 107.0 10/14/2019 1155     Risk Assessment/Calculations:                Physical Exam:    VS:  BP 128/82   Pulse  90   Ht 5' 3.25" (1.607 m)   Wt 223 lb 6.4 oz (101.3 kg)   SpO2 94%   BMI 39.26 kg/m     Wt Readings from Last 3 Encounters:  01/14/23 223 lb 6.4 oz (101.3 kg)  12/24/22 233 lb (105.7 kg)  08/21/22 240 lb (108.9 kg)     GEN:  Well nourished, well developed in no acute distress HEENT: Normal NECK: No JVD; No carotid bruits LYMPHATICS: No lymphadenopathy CARDIAC: RRR, no murmurs, rubs, gallops RESPIRATORY:  Clear to auscultation without rales, wheezing or rhonchi  ABDOMEN: Soft, non-tender, non-distended MUSCULOSKELETAL:  No edema; No deformity  SKIN: Warm and dry NEUROLOGIC:  Alert and oriented x 3 PSYCHIATRIC:  Normal affect   ASSESSMENT:    1. DOE (dyspnea on exertion)   2. Morbid obesity with body mass index of 40.0-44.9 in adult (HCC)   3. Diastolic dysfunction with chronic heart failure (HCC)   4. Aortic atherosclerosis (HCC)   5. CKD (chronic kidney disease) stage 4, GFR 15-29 ml/min (HCC)   6. Essential hypertension    PLAN:    In order of problems listed  above:  Stable symptoms, continue current medical therapy. Patient doing her best to stay as active as she can.  Her weight is down 20 pounds since her visit last year. Now treated with Jardiance.  Doing better with weight loss.  Overall stable. Patient on pravastatin Renal function improved after discontinuing diuretic therapy.  Followed by Dr. Marisue Humble.  Creatinine has gone from about 2.7 down to about 1.5 mg/dL. Blood pressure is well-controlled on current regimen with irbesartan.     Medication Adjustments/Labs and Tests Ordered: Current medicines are reviewed at length with the patient today.  Concerns regarding medicines are outlined above.  No orders of the defined types were placed in this encounter.  No orders of the defined types were placed in this encounter.   Patient Instructions  Medication Instructions:  Your physician recommends that you continue on your current medications as directed. Please refer to the Current Medication list given to you today.  *If you need a refill on your cardiac medications before your next appointment, please call your pharmacy*   Lab Work: NONE If you have labs (blood work) drawn today and your tests are completely normal, you will receive your results only by: MyChart Message (if you have MyChart) OR A paper copy in the mail If you have any lab test that is abnormal or we need to change your treatment, we will call you to review the results.   Testing/Procedures: NONE   Follow-Up: At [facility], you and your health needs are our priority.  As part of our continuing mission to provide you with exceptional heart care, we have created designated Provider Care Teams.  These Care Teams include your primary Cardiologist (physician) and Advanced Practice Providers (APPs -  Physician Assistants and Nurse Practitioners) who all work together to provide you with the care you need, when you need it.  We recommend signing up for the  patient portal called "MyChart".  Sign up information is provided on this After Visit Summary.  MyChart is used to connect with patients for Virtual Visits (Telemedicine).  Patients are able to view lab/test results, encounter notes, upcoming appointments, etc.  Non-urgent messages can be sent to your provider as well.   To learn more about what you can do with MyChart, go to [email or web address].    Your next appointment:   1 year(s)  Provider:   Tonny Bollman, MD        Signed, Tonny Bollman, MD  01/14/2023 2:22 PM    [facility]

## 2023-01-14 NOTE — Patient Instructions (Signed)
Medication Instructions:  Your physician recommends that you continue on your current medications as directed. Please refer to the Current Medication list given to you today.  *If you need a refill on your cardiac medications before your next appointment, please call your pharmacy*   Lab Work: NONE If you have labs (blood work) drawn today and your tests are completely normal, you will receive your results only by: MyChart Message (if you have MyChart) OR A paper copy in the mail If you have any lab test that is abnormal or we need to change your treatment, we will call you to review the results.   Testing/Procedures: NONE   Follow-Up: At [facility], you and your health needs are our priority.  As part of our continuing mission to provide you with exceptional heart care, we have created designated Provider Care Teams.  These Care Teams include your primary Cardiologist (physician) and Advanced Practice Providers (APPs -  Physician Assistants and Nurse Practitioners) who all work together to provide you with the care you need, when you need it.  We recommend signing up for the patient portal called "MyChart".  Sign up information is provided on this After Visit Summary.  MyChart is used to connect with patients for Virtual Visits (Telemedicine).  Patients are able to view lab/test results, encounter notes, upcoming appointments, etc.  Non-urgent messages can be sent to your provider as well.   To learn more about what you can do with MyChart, go to [email or web address].    Your next appointment:   1 year(s)  Provider:   Michael Cooper, MD      

## 2023-01-16 DIAGNOSIS — R609 Edema, unspecified: Secondary | ICD-10-CM | POA: Diagnosis not present

## 2023-01-16 DIAGNOSIS — I129 Hypertensive chronic kidney disease with stage 1 through stage 4 chronic kidney disease, or unspecified chronic kidney disease: Secondary | ICD-10-CM | POA: Diagnosis not present

## 2023-01-16 DIAGNOSIS — N183 Chronic kidney disease, stage 3 unspecified: Secondary | ICD-10-CM | POA: Diagnosis not present

## 2023-01-23 LAB — BASIC METABOLIC PANEL
BUN: 22 — AB (ref 4–21)
CO2: 22 (ref 13–22)
Chloride: 107 (ref 99–108)
Creatinine: 1.6 — AB (ref 0.5–1.1)
Glucose: 90
Potassium: 4.5 mEq/L (ref 3.5–5.1)
Sodium: 140 (ref 137–147)

## 2023-01-23 LAB — COMPREHENSIVE METABOLIC PANEL: eGFR: 33

## 2023-01-29 ENCOUNTER — Encounter: Payer: Self-pay | Admitting: Internal Medicine

## 2023-01-29 ENCOUNTER — Other Ambulatory Visit: Payer: Self-pay | Admitting: Internal Medicine

## 2023-01-29 DIAGNOSIS — M170 Bilateral primary osteoarthritis of knee: Secondary | ICD-10-CM

## 2023-01-29 DIAGNOSIS — M48062 Spinal stenosis, lumbar region with neurogenic claudication: Secondary | ICD-10-CM

## 2023-01-29 DIAGNOSIS — M5136 Other intervertebral disc degeneration, lumbar region: Secondary | ICD-10-CM

## 2023-01-29 MED ORDER — OXYCODONE HCL 5 MG PO TABS: 5.0000 mg | ORAL_TABLET | ORAL | 0 refills | Status: DC | PRN

## 2023-02-02 ENCOUNTER — Other Ambulatory Visit: Payer: Self-pay | Admitting: Internal Medicine

## 2023-02-02 DIAGNOSIS — F418 Other specified anxiety disorders: Secondary | ICD-10-CM

## 2023-02-05 ENCOUNTER — Other Ambulatory Visit: Payer: Self-pay | Admitting: Internal Medicine

## 2023-02-05 DIAGNOSIS — I5032 Chronic diastolic (congestive) heart failure: Secondary | ICD-10-CM

## 2023-02-05 DIAGNOSIS — E781 Pure hyperglyceridemia: Secondary | ICD-10-CM

## 2023-02-06 DIAGNOSIS — M48062 Spinal stenosis, lumbar region with neurogenic claudication: Secondary | ICD-10-CM | POA: Diagnosis not present

## 2023-02-06 DIAGNOSIS — M47816 Spondylosis without myelopathy or radiculopathy, lumbar region: Secondary | ICD-10-CM | POA: Diagnosis not present

## 2023-02-14 DIAGNOSIS — M48062 Spinal stenosis, lumbar region with neurogenic claudication: Secondary | ICD-10-CM | POA: Diagnosis not present

## 2023-02-19 ENCOUNTER — Ambulatory Visit (INDEPENDENT_AMBULATORY_CARE_PROVIDER_SITE_OTHER): Payer: Medicare Other | Admitting: Internal Medicine

## 2023-02-19 ENCOUNTER — Encounter: Payer: Self-pay | Admitting: Internal Medicine

## 2023-02-19 VITALS — BP 142/86 | HR 77 | Temp 98.6°F | Resp 16 | Ht 63.75 in | Wt 218.0 lb

## 2023-02-19 DIAGNOSIS — E785 Hyperlipidemia, unspecified: Secondary | ICD-10-CM

## 2023-02-19 DIAGNOSIS — E038 Other specified hypothyroidism: Secondary | ICD-10-CM

## 2023-02-19 DIAGNOSIS — N184 Chronic kidney disease, stage 4 (severe): Secondary | ICD-10-CM

## 2023-02-19 DIAGNOSIS — I5032 Chronic diastolic (congestive) heart failure: Secondary | ICD-10-CM

## 2023-02-19 DIAGNOSIS — I10 Essential (primary) hypertension: Secondary | ICD-10-CM | POA: Diagnosis not present

## 2023-02-19 LAB — LIPID PANEL
Cholesterol: 165 mg/dL (ref 0–200)
HDL: 79.1 mg/dL (ref >39.00)
LDL Cholesterol: 65 mg/dL (ref 0–99)
NonHDL: 86.2
Total CHOL/HDL Ratio: 2
Triglycerides: 104 mg/dL (ref 0.0–149.0)
VLDL: 20.8 mg/dL (ref 0.0–40.0)

## 2023-02-19 LAB — HEPATIC FUNCTION PANEL
ALT: 26 U/L (ref 0–35)
AST: 20 U/L (ref 0–37)
Albumin: 4.3 g/dL (ref 3.5–5.2)
Alkaline Phosphatase: 70 U/L (ref 39–117)
Bilirubin, Direct: 0.2 mg/dL (ref 0.0–0.3)
Total Bilirubin: 0.8 mg/dL (ref 0.2–1.2)
Total Protein: 7 g/dL (ref 6.0–8.3)

## 2023-02-19 NOTE — Patient Instructions (Signed)

## 2023-02-19 NOTE — Progress Notes (Signed)
Subjective:  Patient ID: Leslie Edwards, female    DOB: 09/10/1946  Age: 77 y.o. MRN: [id number]  CC: Congestive Heart Failure and Back Pain  HPI Leslie Edwards presents for f/up -  She is active and denies chest pain, shortness of breath, diaphoresis, or edema.  Outpatient Medications Prior to Visit  Medication Sig Dispense Refill   albuterol (PROVENTIL HFA;VENTOLIN HFA) 108 (90 Base) MCG/ACT inhaler Inhale 2 puffs into the lungs every 6 (six) hours as needed. 18 g 11   allopurinol (ZYLOPRIM) 100 MG tablet Take 1 tablet (100 mg total) by mouth daily. 90 tablet 1   Ascorbic Acid (VITAMIN C) 1000 MG tablet Take 1,000 mg by mouth daily.     b complex vitamins tablet Take 1 tablet by mouth daily.     buPROPion (WELLBUTRIN XL) 150 MG 24 hr tablet Take 3 tablets (450 mg total) by mouth daily. 270 tablet 1   empagliflozin (JARDIANCE) 10 MG TABS tablet Take 1 tablet (10 mg total) by mouth daily before breakfast. 90 tablet 0   famotidine (PEPCID) 10 MG tablet Take 10 mg by mouth 2 (two) times daily as needed for heartburn or indigestion.     Fluticasone-Umeclidin-Vilant (TRELEGY ELLIPTA) 100-62.5-25 MCG/ACT AEPB Inhale 1 puff into the lungs daily. 3 each 1   ipratropium-albuterol (DUONEB) 0.5-2.5 (3) MG/3ML SOLN Take 3 mLs by nebulization as needed. 360 mL 1   irbesartan (AVAPRO) 150 MG tablet Take 1 tablet (150 mg total) by mouth daily. 90 tablet 1   liothyronine (CYTOMEL) 25 MCG tablet Take 1 tablet (25 mcg total) by mouth daily. 90 tablet 1   mometasone (NASONEX) 50 MCG/ACT nasal spray Place 2 sprays into the nose as needed. 3 each 1   Multiple Vitamin (MULTIVITAMIN) tablet Take 1 tablet by mouth daily.     omega-3 acid ethyl esters (LOVAZA) 1 g capsule TAKE TWO CAPSULES BY MOUTH TWICE DAILY 360 capsule 0   oxyCODONE (OXY IR/ROXICODONE) 5 MG immediate release tablet Take 1 tablet (5 mg total) by mouth every 4 (four) hours as needed for severe pain. 100 tablet 0   pravastatin (PRAVACHOL) 20  MG tablet Take 1 tablet (20 mg total) by mouth daily. 90 tablet 1   pregabalin (LYRICA) 75 MG capsule Take 75 mg by mouth 3 (three) times daily. Takes 2-3 times daily as needed     Vilazodone HCl (VIIBRYD) 40 MG TABS TAKE ONE TABLET BY MOUTH ONCE DAILY 30 tablet 2   No facility-administered medications prior to visit.    ROS Review of Systems  Constitutional: Negative.  Negative for diaphoresis and fatigue.  HENT:  Positive for trouble swallowing. Negative for voice change.   Eyes: Negative.   Respiratory:  Negative for cough, choking, chest tightness, shortness of breath and wheezing.   Cardiovascular:  Negative for chest pain, palpitations and leg swelling.  Gastrointestinal:  Negative for abdominal pain, constipation, diarrhea, nausea and vomiting.  Endocrine: Negative.   Genitourinary: Negative.  Negative for difficulty urinating.  Musculoskeletal:  Positive for back pain. Negative for joint swelling and myalgias.  Skin: Negative.   Neurological:  Negative for dizziness, weakness and light-headedness.  Hematological:  Negative for adenopathy. Does not bruise/bleed easily.  Psychiatric/Behavioral: Negative.      Objective:  BP (!) 142/86 (BP Location: Right Arm, Patient Position: Sitting, Cuff Size: Large)   Pulse 77   Temp 98.6 F (37 C) (Oral)   Resp 16   Ht 5' 3.75" (1.619 m)   Wt  218 lb (98.9 kg)   SpO2 96%   BMI 37.71 kg/m   BP Readings from Last 3 Encounters:  02/19/23 (!) 142/86  01/14/23 128/82  08/21/22 122/72    Wt Readings from Last 3 Encounters:  02/19/23 218 lb (98.9 kg)  01/14/23 223 lb 6.4 oz (101.3 kg)  12/24/22 233 lb (105.7 kg)    Physical Exam Vitals reviewed.  Constitutional:      General: She is not in acute distress.    Appearance: She is not ill-appearing, toxic-appearing or diaphoretic.  HENT:     Nose: Nose normal.     Mouth/Throat:     Mouth: Mucous membranes are moist.  Eyes:     General: No scleral icterus.     Conjunctiva/sclera: Conjunctivae normal.  Cardiovascular:     Rate and Rhythm: Normal rate and regular rhythm.     Heart sounds: Murmur heard.     Systolic murmur is present with a grade of 1/6.     No friction rub. No gallop.  Pulmonary:     Effort: Pulmonary effort is normal.     Breath sounds: No stridor. No wheezing, rhonchi or rales.  Abdominal:     General: Abdomen is flat.     Palpations: There is no mass.     Tenderness: There is no abdominal tenderness. There is no guarding.     Hernia: No hernia is present.  Musculoskeletal:        General: Normal range of motion.     Cervical back: Neck supple.     Right lower leg: No edema.     Left lower leg: No edema.  Lymphadenopathy:     Cervical: No cervical adenopathy.  Skin:    General: Skin is warm and dry.  Neurological:     General: No focal deficit present.     Mental Status: She is alert. Mental status is at baseline.     Lab Results  Component Value Date   WBC 7.5 05/30/2022   HGB 13.0 05/30/2022   HCT 39.1 05/30/2022   PLT 236 05/30/2022   GLUCOSE 95 08/21/2022   CHOL 165 02/19/2023   TRIG 104.0 02/19/2023   HDL 79.10 02/19/2023   LDLDIRECT 107.0 10/14/2019   LDLCALC 65 02/19/2023   ALT 26 02/19/2023   AST 20 02/19/2023   NA 140 01/23/2023   K 4.5 01/23/2023   CL 107 01/23/2023   CREATININE 1.6 (A) 01/23/2023   BUN 22 (A) 01/23/2023   CO2 22 01/23/2023   TSH 0.97 02/19/2023   HGBA1C 5.3 04/28/2009    DG Chest 2 View  Result Date: 05/30/2022 CLINICAL DATA:  Shortness of breath and chest pain EXAM: CHEST - 2 VIEW COMPARISON:  02/07/2021 FINDINGS: Chronic cardiomegaly and aortic atherosclerosis. Chronic lung markings with areas of linear scarring. No sign of active infiltrate, collapse or effusion. No frank pulmonary edema. No acute bone finding. IMPRESSION: Cardiomegaly. Aortic atherosclerosis. Chronic interstitial lung markings and pulmonary scarring. No active process identified. Electronically Signed    By: Paulina Fusi M.D.   On: 05/30/2022 15:27    Assessment & Plan:   Essential hypertension - Her blood pressure is well-controlled. -     Thyroid Panel With TSH; Future -     Hepatic function panel; Future  Hyperlipidemia with target LDL less than 100 -     Lipid panel; Future -     Thyroid Panel With TSH; Future -     Hepatic function panel; Future  Other  specified hypothyroidism- She is euthyroid. -     Thyroid Panel With TSH; Future  Diastolic dysfunction with chronic heart failure (HCC)- She has a normal volume status.  Kidney disease, chronic, stage IV (GFR 15-29 ml/min) (HCC)- Her renal function is stable.     Follow-up: Return in about 6 months (around 08/21/2023).  Sanda Linger, MD

## 2023-02-20 LAB — THYROID PANEL WITH TSH
Free Thyroxine Index: 1.2 — ABNORMAL LOW (ref 1.4–3.8)
T3 Uptake: 33 % (ref 22–35)
T4, Total: 3.6 ug/dL — ABNORMAL LOW (ref 5.1–11.9)
TSH: 0.97 mIU/L (ref 0.40–4.50)

## 2023-03-05 DIAGNOSIS — M48062 Spinal stenosis, lumbar region with neurogenic claudication: Secondary | ICD-10-CM | POA: Diagnosis not present

## 2023-03-19 DIAGNOSIS — K080 Exfoliation of teeth due to systemic causes: Secondary | ICD-10-CM | POA: Diagnosis not present

## 2023-03-25 DIAGNOSIS — K080 Exfoliation of teeth due to systemic causes: Secondary | ICD-10-CM | POA: Diagnosis not present

## 2023-05-03 ENCOUNTER — Other Ambulatory Visit: Payer: Self-pay | Admitting: Internal Medicine

## 2023-05-03 ENCOUNTER — Encounter: Payer: Self-pay | Admitting: Internal Medicine

## 2023-05-03 DIAGNOSIS — F418 Other specified anxiety disorders: Secondary | ICD-10-CM

## 2023-05-03 DIAGNOSIS — M170 Bilateral primary osteoarthritis of knee: Secondary | ICD-10-CM

## 2023-05-03 DIAGNOSIS — M5136 Other intervertebral disc degeneration, lumbar region: Secondary | ICD-10-CM

## 2023-05-03 DIAGNOSIS — M48062 Spinal stenosis, lumbar region with neurogenic claudication: Secondary | ICD-10-CM

## 2023-05-14 IMAGING — MR MR LUMBAR SPINE W/O CM
4 of 5 series · 26 of 48 positions shown · non-contrast
Comparison: Radiography 10/14/2020.  MRI 06/14/2014.

CLINICAL DATA: Low back pain radiating to the left leg primarily.
Occasional symptoms on the right. Numbness of the left leg.

EXAM:
MRI LUMBAR SPINE WITHOUT CONTRAST
TECHNIQUE: Multiplanar, multisequence MR imaging of the lumbar spine was
performed. No intravenous contrast was administered.

[Series 3: T2 · sagittal · 4.0mm · 1.09mm/px · 6 of 17 slices shown (1 of 2)]
[im 1/17]
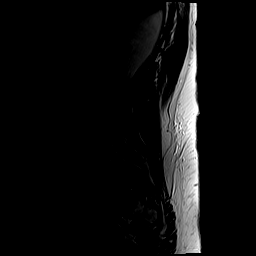
[im 4/17]
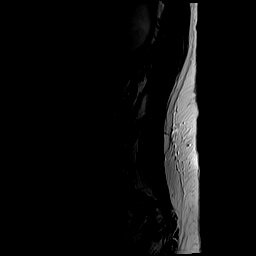
[im 7/17]
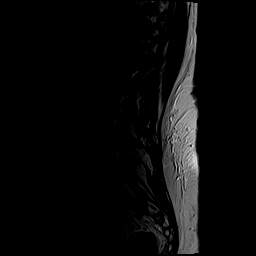
[im 10/17]
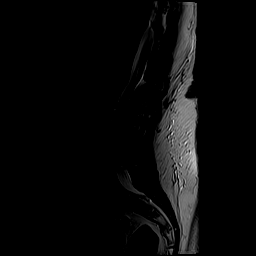
[im 13/17]
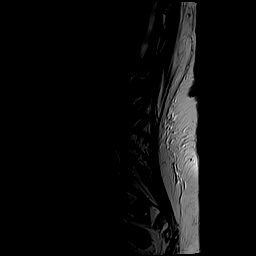
[im 17/17]
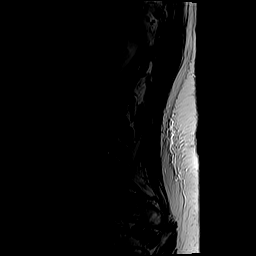

[Series 5: T1 · sagittal · 4.0mm · 1.09mm/px · 7 of 17 slices shown (1 of 2)]
[im 1/17]
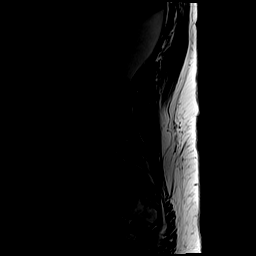
[im 3/17]
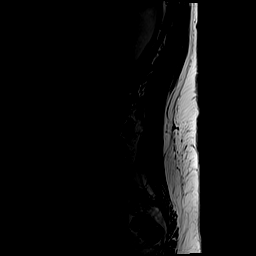
[im 6/17]
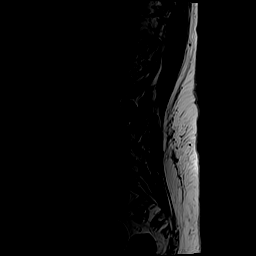
[im 9/17]
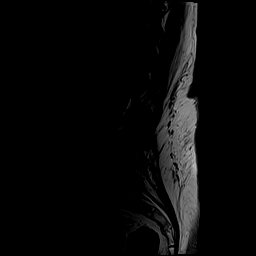
[im 11/17]
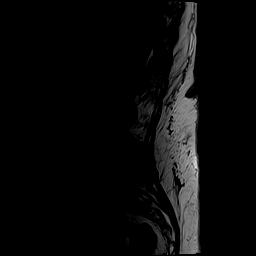
[im 14/17]
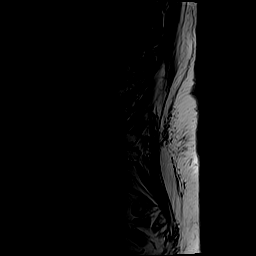
[im 17/17]
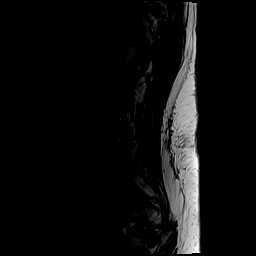

[Series 6: T2 · axial · 4.0mm · 0.39mm/px · z∈[-48,+167]mm · 8 of 37 slices shown (2 of 2)]
[im 1/37]
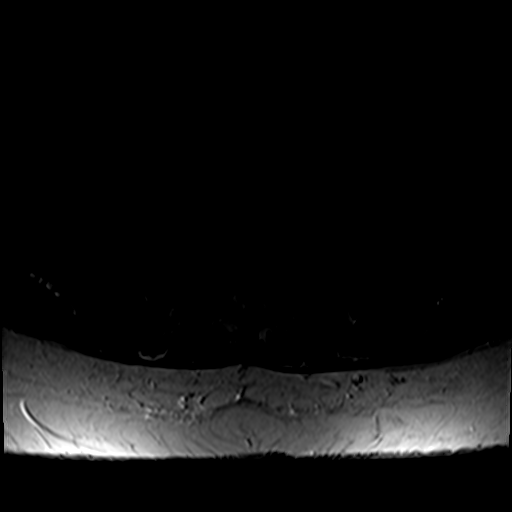
[im 6/37]
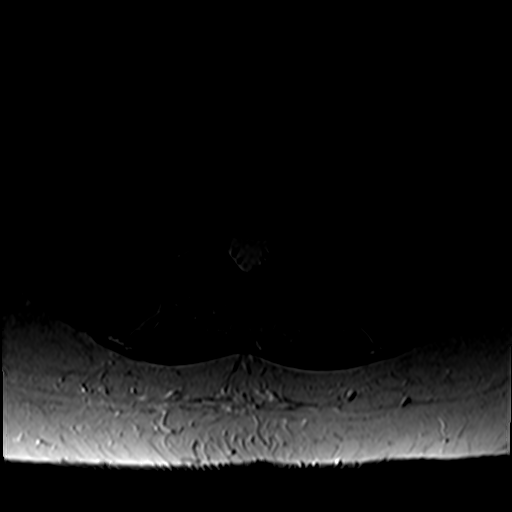
[im 12/37]
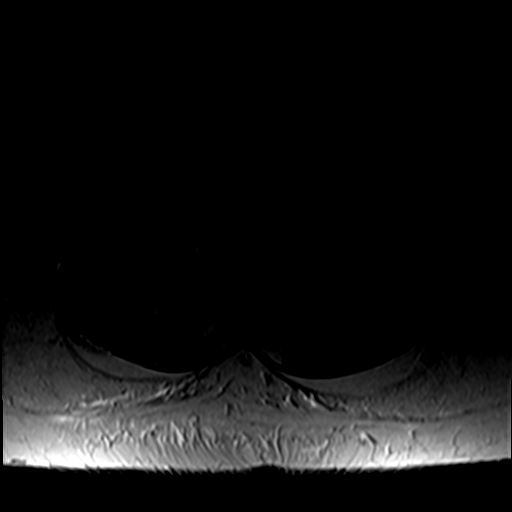
[im 17/37]
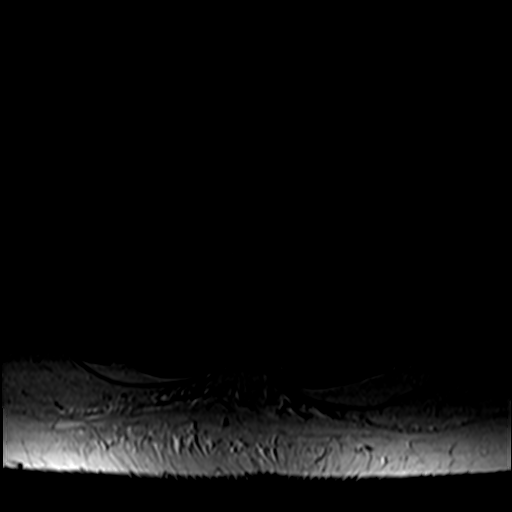
[im 20/37]
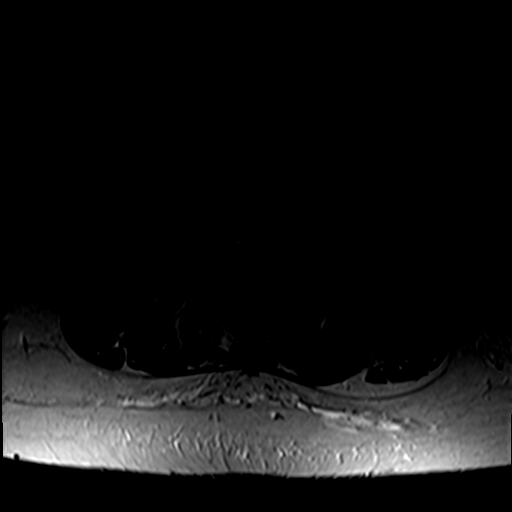
[im 25/37]
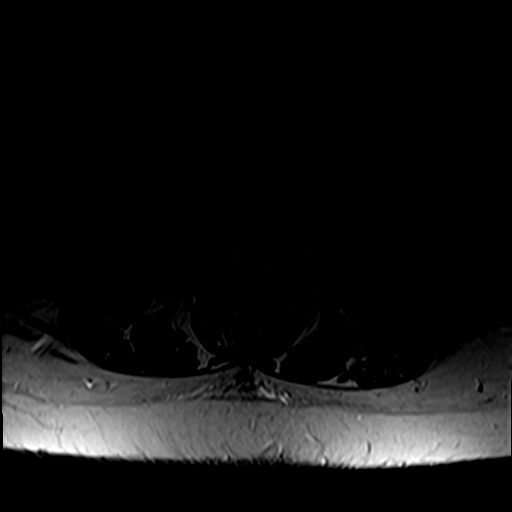
[im 31/37]
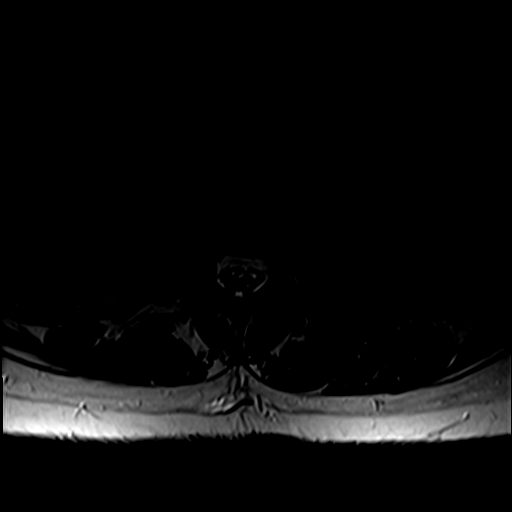
[im 37/37]
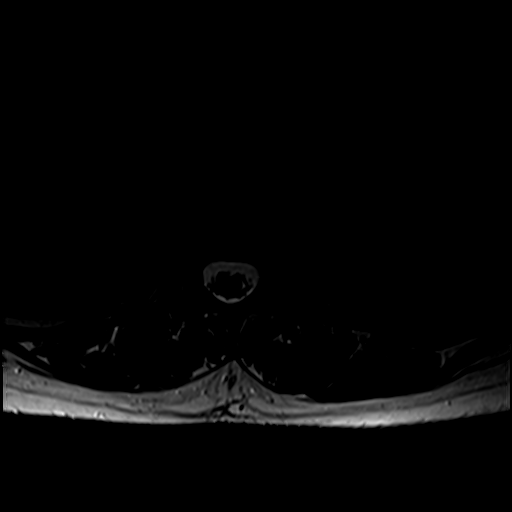

[Series 7: T1 · axial · 4.0mm · 0.39mm/px · z∈[-48,+139]mm · 5 of 37 slices shown (2 of 2)]
[im 1/37]
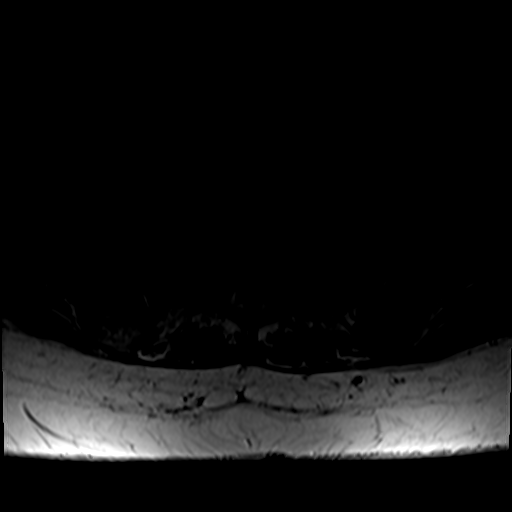
[im 6/37]
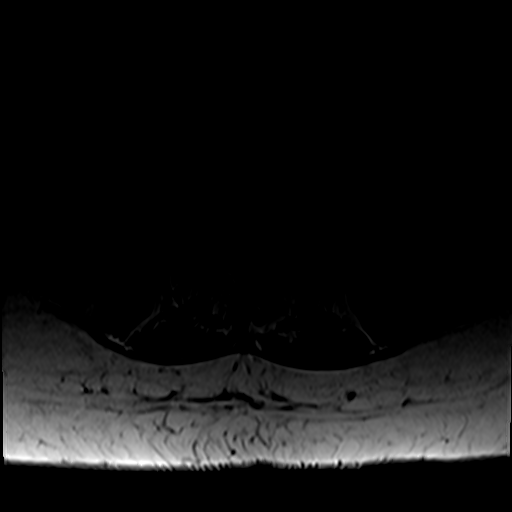
[im 12/37]
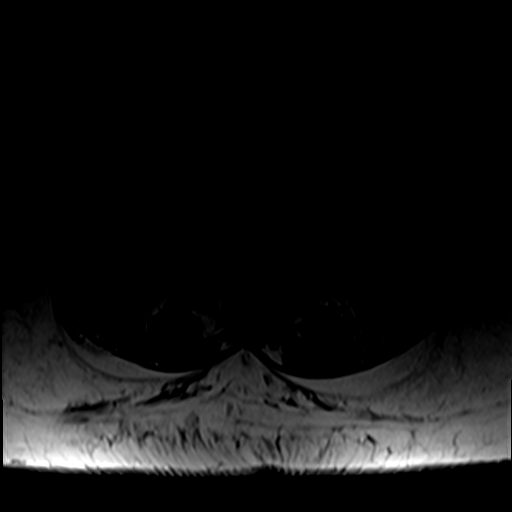
[im 20/37]
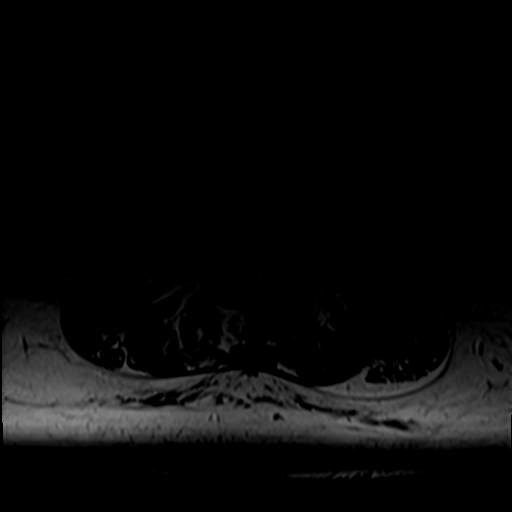
[im 31/37]
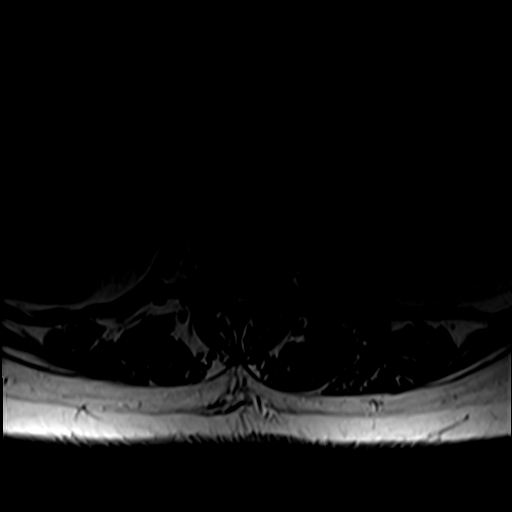

[26 of 48 positions shown; findings below may reference images not displayed]

FINDINGS: Segmentation:  5 lumbar type vertebral bodies.

Alignment: Chronic scoliotic curvature convex to the left. Chronic
degenerative anterolisthesis at L4-5, measuring 7 mm today,
increased 2 mm since 2293.

Vertebrae: No fracture or focal bone lesion. Discogenic endplate
marrow changes at T11-12 which are newly seen and could relate to
regional pain.

Conus medullaris and cauda equina: Conus extends to the L1 level.
Conus and cauda equina appear normal.

Paraspinal and other soft tissues: Negative

Disc levels:

T11-12: Disc degeneration with mild bulging of the disc. No
compressive stenosis. Facet osteoarthritis. Discogenic endplate
marrow changes as noted above, new since 2293, which could relate to
regional pain.

T12-L1: Normal interspace.

L1-2: 2 mm retrolisthesis. Bulging of the disc with slight caudal
down turning. Mild narrowing of the lateral recesses but no likely
neural compression. Similar appearance the study of 2293.

L2-3: Disc degeneration with endplate osteophytes and bulging of the
disc. Mild facet and ligamentous hypertrophy. Mild narrowing of the
lateral recesses and foramina. Similar appearance to the study of
2293. No definite neural compression.

L3-4: Endplate osteophytes and bulging of the disc, with a small
left foraminal disc herniation. Facet and ligamentous hypertrophy.
Mild narrowing of the lateral recesses. Small left foraminal disc
herniation likely to affect the left L3 nerve. The small left
foraminal disc is new since 2293.

L4-5: Chronic facet arthropathy, now with 7 mm of anterolisthesis,
increased 2 mm since 2293. Endplate osteophytes and bulging of the
disc. Moderate multifactorial stenosis at this level, left worse
than right, that could cause neural compression on either or both
sides. This includes left foraminal stenosis that could affect the
exiting L4 nerve. The facet joints are gaping and fluid-filled, in
this appearance could worsen with standing or flexion.

L5-S1: Minimal bulging of the disc. Facet arthropathy with gaping,
fluid-filled joints. No compressive stenosis of the canal. Left
foraminal narrowing because of encroachment by osteophyte and
bulging disc material could affect the left L5 nerve. This
appearance could worsen with standing or flexion. The facet
arthropathy and the left foraminal stenosis have worsened since
2293.
IMPRESSION: T11-12: Degenerative disc disease with discogenic edema the
endplates, newly seen since 2293. This could relate to regional
pain.

L3-4: Newly seen small foraminal disc herniation on the left at L3-4
could focally affect the left L3 nerve.

L4-5: Facet arthropathy with worsened anterolisthesis, now measuring
7 mm, increased 2 mm since 2293. Multifactorial stenosis at this
level that could cause neural compression on either or both sides.
Foraminal stenosis on the left could affect the exiting L4 nerve.
The facet joints are gaping and fluid-filled in the appearance could
worsen with standing or flexion.

L5-S1: Worsening of facet arthropathy with gaping, fluid-filled
joints. Foraminal stenosis on the left that could affect the exiting
left L5 nerve. The appearance could worsen with standing or flexion.
This appearance has worsened since [DATE].

## 2023-06-05 DIAGNOSIS — M48062 Spinal stenosis, lumbar region with neurogenic claudication: Secondary | ICD-10-CM | POA: Diagnosis not present

## 2023-06-11 ENCOUNTER — Other Ambulatory Visit: Payer: Self-pay | Admitting: Internal Medicine

## 2023-06-11 DIAGNOSIS — I5032 Chronic diastolic (congestive) heart failure: Secondary | ICD-10-CM

## 2023-06-26 DIAGNOSIS — M48062 Spinal stenosis, lumbar region with neurogenic claudication: Secondary | ICD-10-CM | POA: Diagnosis not present

## 2023-06-26 DIAGNOSIS — M5416 Radiculopathy, lumbar region: Secondary | ICD-10-CM | POA: Diagnosis not present

## 2023-07-15 ENCOUNTER — Other Ambulatory Visit: Payer: Self-pay | Admitting: Internal Medicine

## 2023-07-15 DIAGNOSIS — E781 Pure hyperglyceridemia: Secondary | ICD-10-CM

## 2023-07-22 DIAGNOSIS — M5416 Radiculopathy, lumbar region: Secondary | ICD-10-CM | POA: Diagnosis not present

## 2023-08-13 ENCOUNTER — Encounter: Payer: Self-pay | Admitting: Pharmacist

## 2023-08-13 NOTE — Progress Notes (Signed)
Pharmacy Quality Measure Review  This patient is appearing on a report for being at risk of failing the adherence measure for diabetes medications this calendar year.   Medication: Jardiance 10 mg Last fill date: 07/11/23 for 30 day supply  Insurance report was not up to date. No action needed at this time.   Arbutus Leas, PharmD, BCPS, CPP Clinical Pharmacist Practitioner [facility] [phone]

## 2023-08-19 DIAGNOSIS — M48062 Spinal stenosis, lumbar region with neurogenic claudication: Secondary | ICD-10-CM | POA: Diagnosis not present

## 2023-08-20 ENCOUNTER — Other Ambulatory Visit: Payer: Self-pay | Admitting: Orthopedic Surgery

## 2023-08-20 ENCOUNTER — Other Ambulatory Visit: Payer: Self-pay | Admitting: Internal Medicine

## 2023-08-20 DIAGNOSIS — M47816 Spondylosis without myelopathy or radiculopathy, lumbar region: Secondary | ICD-10-CM

## 2023-08-20 DIAGNOSIS — M48062 Spinal stenosis, lumbar region with neurogenic claudication: Secondary | ICD-10-CM

## 2023-08-20 DIAGNOSIS — M5416 Radiculopathy, lumbar region: Secondary | ICD-10-CM

## 2023-08-20 DIAGNOSIS — E038 Other specified hypothyroidism: Secondary | ICD-10-CM

## 2023-08-21 ENCOUNTER — Encounter: Payer: Self-pay | Admitting: Internal Medicine

## 2023-08-21 ENCOUNTER — Ambulatory Visit (INDEPENDENT_AMBULATORY_CARE_PROVIDER_SITE_OTHER): Payer: Medicare Other | Admitting: Internal Medicine

## 2023-08-21 VITALS — BP 134/72 | HR 65 | Temp 98.2°F | Wt 205.8 lb

## 2023-08-21 DIAGNOSIS — M170 Bilateral primary osteoarthritis of knee: Secondary | ICD-10-CM

## 2023-08-21 DIAGNOSIS — M810 Age-related osteoporosis without current pathological fracture: Secondary | ICD-10-CM

## 2023-08-21 DIAGNOSIS — E038 Other specified hypothyroidism: Secondary | ICD-10-CM | POA: Diagnosis not present

## 2023-08-21 DIAGNOSIS — I5032 Chronic diastolic (congestive) heart failure: Secondary | ICD-10-CM

## 2023-08-21 DIAGNOSIS — M1A39X0 Chronic gout due to renal impairment, multiple sites, without tophus (tophi): Secondary | ICD-10-CM

## 2023-08-21 DIAGNOSIS — M48062 Spinal stenosis, lumbar region with neurogenic claudication: Secondary | ICD-10-CM

## 2023-08-21 DIAGNOSIS — K2100 Gastro-esophageal reflux disease with esophagitis, without bleeding: Secondary | ICD-10-CM | POA: Diagnosis not present

## 2023-08-21 DIAGNOSIS — I10 Essential (primary) hypertension: Secondary | ICD-10-CM | POA: Diagnosis not present

## 2023-08-21 DIAGNOSIS — M51369 Other intervertebral disc degeneration, lumbar region without mention of lumbar back pain or lower extremity pain: Secondary | ICD-10-CM

## 2023-08-21 DIAGNOSIS — N184 Chronic kidney disease, stage 4 (severe): Secondary | ICD-10-CM | POA: Diagnosis not present

## 2023-08-21 LAB — CBC WITH DIFFERENTIAL/PLATELET
Basophils Absolute: 0.1 10*3/uL (ref 0.0–0.1)
Basophils Relative: 1.2 % (ref 0.0–3.0)
Eosinophils Absolute: 0.3 10*3/uL (ref 0.0–0.7)
Eosinophils Relative: 6.5 % — ABNORMAL HIGH (ref 0.0–5.0)
HCT: 38.8 % (ref 36.0–46.0)
Hemoglobin: 13 g/dL (ref 12.0–15.0)
Lymphocytes Relative: 26 % (ref 12.0–46.0)
Lymphs Abs: 1.3 10*3/uL (ref 0.7–4.0)
MCHC: 33.4 g/dL (ref 30.0–36.0)
MCV: 101 fL — ABNORMAL HIGH (ref 78.0–100.0)
Monocytes Absolute: 0.6 10*3/uL (ref 0.1–1.0)
Monocytes Relative: 11.3 % (ref 3.0–12.0)
Neutro Abs: 2.7 10*3/uL (ref 1.4–7.7)
Neutrophils Relative %: 55 % (ref 43.0–77.0)
Platelets: 226 10*3/uL (ref 150.0–400.0)
RBC: 3.84 Mil/uL — ABNORMAL LOW (ref 3.87–5.11)
RDW: 14.7 % (ref 11.5–15.5)
WBC: 4.9 10*3/uL (ref 4.0–10.5)

## 2023-08-21 LAB — BASIC METABOLIC PANEL
BUN: 34 mg/dL — ABNORMAL HIGH (ref 6–23)
CO2: 28 meq/L (ref 19–32)
Calcium: 9.7 mg/dL (ref 8.4–10.5)
Chloride: 106 meq/L (ref 96–112)
Creatinine, Ser: 1.53 mg/dL — ABNORMAL HIGH (ref 0.40–1.20)
GFR: 32.37 mL/min — ABNORMAL LOW (ref >60.00)
Glucose, Bld: 92 mg/dL (ref 70–99)
Potassium: 4.4 meq/L (ref 3.5–5.1)
Sodium: 141 meq/L (ref 135–145)

## 2023-08-21 LAB — URINALYSIS, ROUTINE W REFLEX MICROSCOPIC
Bilirubin Urine: NEGATIVE
Hgb urine dipstick: NEGATIVE
Ketones, ur: NEGATIVE
Nitrite: NEGATIVE
Specific Gravity, Urine: 1.01 (ref 1.000–1.030)
Total Protein, Urine: NEGATIVE
Urine Glucose: 100 — AB
Urobilinogen, UA: 0.2 (ref 0.0–1.0)
pH: 6.5 (ref 5.0–8.0)

## 2023-08-21 LAB — URIC ACID: Uric Acid, Serum: 5.5 mg/dL (ref 2.4–7.0)

## 2023-08-21 LAB — PHOSPHORUS: Phosphorus: 3.6 mg/dL (ref 2.3–4.6)

## 2023-08-21 MED ORDER — OXYCODONE HCL 5 MG PO TABS: 5.0000 mg | ORAL_TABLET | Freq: Four times a day (QID) | ORAL | 0 refills | Status: DC | PRN

## 2023-08-21 NOTE — Patient Instructions (Signed)
 Chronic Kidney Disease, Adult Chronic kidney disease (CKD) occurs when the kidneys are slowly and permanently damaged over a long period of time. The kidneys are a pair of organs that do many important jobs in the body, including: Removing waste and extra fluid from the blood to make urine. Making hormones that maintain the amount of fluid in tissues and blood vessels. Maintaining the right amount of fluids and chemicals in the body. A small amount of kidney damage may not cause problems, but a large amount of damage may make it hard or impossible for the kidneys to work right. Steps must be taken to slow kidney damage or to stop it from getting worse. If steps are not taken, the kidneys may stop working permanently (end-stage renal disease, or ESRD). Most of the time, CKD does not go away, but it can often be controlled. People who have CKD are usually able to live full lives. What are the causes? The most common causes of this condition are diabetes and high blood pressure (hypertension). Other causes include: Cardiovascular diseases. These affect the heart and blood vessels. Kidney diseases. These include: Glomerulonephritis, or inflammation of the tiny filters in the kidneys. Interstitial nephritis. This is swelling of the small tubes of the kidneys and of the surrounding structures. Polycystic kidney disease, in which clusters of fluid-filled sacs form within the kidneys. Renal vascular disease. This includes disorders that affect the arteries and veins of the kidneys. Diseases that affect the body's defense system (immune system). A problem with urine flow. This may be caused by: Kidney stones. Cancer. An enlarged prostate, in males. A kidney infection or urinary tract infection (UTI) that keeps coming back. Vasculitis. This is swelling or inflammation of the blood vessels. What increases the risk? Your chances of having kidney disease increase with age. The following factors may make  you more likely to develop this condition: A family history of kidney disease or kidney failure. Kidney failure means the kidneys can no longer work right. Certain genetic diseases. Taking medicines often that are damaging to the kidneys. Being around or being in contact with toxic substances. Obesity. A history of tobacco use. What are the signs or symptoms? Symptoms of this condition include: Feeling very tired (lethargic) and having less energy. Swelling, or edema, of the face, legs, ankles, or feet. Nausea or vomiting, or loss of appetite. Confusion or trouble concentrating. Muscle twitches and cramps, especially in the legs. Dry, itchy skin. A metallic taste in the mouth. Producing less urine, or producing more urine (especially at night). Shortness of breath. Trouble sleeping. CKD may also result in not having enough red blood cells or hemoglobin in the blood (anemia) or having weak bones (bone disease). Symptoms develop slowly and may not be obvious until the kidney damage becomes severe. It is possible to have kidney disease for years without having symptoms. How is this diagnosed? This condition may be diagnosed based on: Blood tests. Urine tests. Imaging tests, such as an ultrasound or a CT scan. A kidney biopsy. This involves removing a sample of kidney tissue to be looked at under a microscope. Results from these tests will help to determine how serious the CKD is. How is this treated? There is no cure for most cases of this condition, but treatment usually relieves symptoms and prevents or slows the worsening of the disease. Treatment may include: Diet changes, which may require you to avoid alcohol and foods that are high in salt, potassium, phosphorous, and protein. Medicines. These may:  Lower blood pressure. Control blood sugar (glucose). Relieve anemia. Relieve swelling. Protect your bones. Improve the balance of salts and minerals in your blood  (electrolytes). Dialysis, which is a type of treatment that removes toxic waste from the body. It may be needed if you have kidney failure. Managing any other conditions that are causing your CKD or making it worse. Follow these instructions at home: Medicines Take over-the-counter and prescription medicines only as told by your health care provider. The amount of some medicines that you take may need to be changed. Do not take any new medicines unless approved by your health care provider. Many medicines can make kidney damage worse. Do not take any vitamin and mineral supplements unless approved by your health care provider. Many nutritional supplements can make kidney damage worse. Lifestyle  Do not use any products that contain nicotine or tobacco, such as cigarettes, e-cigarettes, and chewing tobacco. If you need help quitting, ask your health care provider. If you drink alcohol: Limit how much you use to: 0-1 drink a day for women who are not pregnant. 0-2 drinks a day for men. Know how much alcohol is in your drink. In the [place] one drink equals one 12 oz bottle of beer (355 mL), one 5 oz glass of wine (148 mL), or one 1 oz glass of hard liquor (44 mL). Maintain a healthy weight. If you need help, ask your health care provider. General instructions  Follow instructions from your health care provider about eating or drinking restrictions, including any prescribed diet. Track your blood pressure at home. Report changes in your blood pressure as told. If you are being treated for diabetes, track your blood glucose levels as told. Start or continue an exercise plan. Exercise at least 30 minutes a day, 5 days a week. Keep your immunizations up to date as told. Keep all follow-up visits. This is important. Where to find more information American Association of Kidney Patients: [employer].is [employer]: www.kidney.org American Kidney Fund: FightingMatch.com.ee Life Options:  www.lifeoptions.org Kidney School: www.kidneyschool.org Contact a health care provider if: Your symptoms get worse. You develop new symptoms. Get help right away if: You develop symptoms of ESRD. These include: Headaches. Numbness in your hands or feet. Easy bruising. Frequent hiccups. Chest pain. Shortness of breath. Lack of menstrual periods, in women. You have a fever. You are producing less urine than usual. You have pain or bleeding when you urinate or when you have a bowel movement. These symptoms may represent a serious problem that is an emergency. Do not wait to see if the symptoms will go away. Get medical help right away. Call your local emergency services (911 in the [place] Do not drive yourself to the hospital. Summary Chronic kidney disease (CKD) occurs when the kidneys become damaged slowly over a long period of time. The most common causes of this condition are diabetes and high blood pressure (hypertension). There is no cure for most cases of CKD, but treatment usually relieves symptoms and prevents or slows the worsening of the disease. Treatment may include a combination of lifestyle changes, medicines, and dialysis. This information is not intended to replace advice given to you by your health care provider. Make sure you discuss any questions you have with your health care provider. Document Revised: 12/04/2019 Document Reviewed: 12/09/2019 Elsevier Patient Education  2024 [employer].

## 2023-08-21 NOTE — Progress Notes (Unsigned)
Subjective:  Patient ID: Leslie Edwards, female    DOB: 02-16-1946  Age: 77 y.o. MRN: [id number]  CC: Hypothyroidism, Back Pain, and Osteoarthritis   HPI Leslie Edwards presents for f/up ---  Discussed the use of AI scribe software for clinical note transcription with the patient, who gave verbal consent to proceed.  History of Present Illness   The patient presents with ongoing stress related to personal circumstances and chronic health issues. She reports a significant issue with sciatica, describing persistent low back pain and radiating discomfort extending to the ankle, predominantly on the left side. The patient notes that nerve ablations were successful for the right leg but less effective for the left.  The patient also mentions memory issues, attributing them in part to her medication, Lyrica. She expresses concern about her pain management, which currently includes oxycodone taken at night. She has been advised to take a combination of Tylenol for arthritis and Advil by a non-medical professional, and she reports that this regimen has provided some relief.  In addition to these issues, the patient reports symptoms potentially related to thyroid dysfunction, including hair loss and episodes of extreme temperature sensitivity resulting in profuse sweating. She has no urinary symptoms and denies any chest pain, shortness of breath, dizziness, or lightheadedness.       Outpatient Medications Prior to Visit  Medication Sig Dispense Refill   albuterol (PROVENTIL HFA;VENTOLIN HFA) 108 (90 Base) MCG/ACT inhaler Inhale 2 puffs into the lungs every 6 (six) hours as needed. 18 g 11   allopurinol (ZYLOPRIM) 100 MG tablet Take 1 tablet (100 mg total) by mouth daily. 90 tablet 1   Ascorbic Acid (VITAMIN C) 1000 MG tablet Take 1,000 mg by mouth daily.     b complex vitamins tablet Take 1 tablet by mouth daily.     baclofen (LIORESAL) 10 MG tablet Take 10 mg by mouth at bedtime.     buPROPion  (WELLBUTRIN XL) 150 MG 24 hr tablet Take 3 tablets (450 mg total) by mouth daily. 270 tablet 1   diazepam (VALIUM) 5 MG tablet Take 5 mg by mouth as needed (When she recieves her injections).     famotidine (PEPCID) 10 MG tablet Take 10 mg by mouth 2 (two) times daily as needed for heartburn or indigestion.     Fluticasone-Umeclidin-Vilant (TRELEGY ELLIPTA) 100-62.5-25 MCG/ACT AEPB Inhale 1 puff into the lungs daily. (Patient taking differently: Inhale 1 puff into the lungs as needed.) 3 each 1   ipratropium-albuterol (DUONEB) 0.5-2.5 (3) MG/3ML SOLN Take 3 mLs by nebulization as needed. 360 mL 1   irbesartan (AVAPRO) 150 MG tablet Take 1 tablet (150 mg total) by mouth daily. 90 tablet 1   JARDIANCE 10 MG TABS tablet TAKE ONE TABLET BY MOUTH DAILY BEFORE BREAKFAST 90 tablet 0   mometasone (NASONEX) 50 MCG/ACT nasal spray Place 2 sprays into the nose as needed. 3 each 1   Multiple Vitamin (MULTIVITAMIN) tablet Take 1 tablet by mouth daily.     omega-3 acid ethyl esters (LOVAZA) 1 g capsule TAKE TWO CAPSULES BY MOUTH TWICE DAILY 360 capsule 0   pravastatin (PRAVACHOL) 20 MG tablet Take 1 tablet (20 mg total) by mouth daily. 90 tablet 1   pregabalin (LYRICA) 25 MG capsule Take 25 mg by mouth daily. Can take up to 3 tablets daily.     pregabalin (LYRICA) 75 MG capsule Take 75 mg by mouth daily.     Vilazodone HCl (VIIBRYD) 40 MG TABS TAKE  ONE TABLET BY MOUTH ONCE DAILY 30 tablet 2   liothyronine (CYTOMEL) 25 MCG tablet Take 1 tablet (25 mcg total) by mouth daily. 90 tablet 1   oxyCODONE (OXY IR/ROXICODONE) 5 MG immediate release tablet Take 1 tablet (5 mg total) by mouth every 6 (six) hours as needed for severe pain. 100 tablet 0   No facility-administered medications prior to visit.    ROS Review of Systems  Constitutional:  Positive for fatigue. Negative for appetite change, chills, diaphoresis, fever and unexpected weight change.  HENT: Negative.    Eyes:  Negative for visual disturbance.   Respiratory: Negative.  Negative for cough, chest tightness, shortness of breath and wheezing.   Cardiovascular:  Negative for chest pain, palpitations and leg swelling.  Gastrointestinal:  Negative for abdominal pain, constipation, diarrhea, nausea and vomiting.  Genitourinary: Negative.   Musculoskeletal:  Positive for arthralgias, back pain and gait problem. Negative for myalgias and neck pain.  Skin: Negative.   Neurological:  Negative for dizziness, weakness and light-headedness.  Hematological:  Negative for adenopathy. Does not bruise/bleed easily.  Psychiatric/Behavioral:  Positive for confusion and decreased concentration. Negative for sleep disturbance. The patient is not nervous/anxious.     Objective:  BP 134/72 (BP Location: Left Arm, Patient Position: Sitting, Cuff Size: Normal)   Pulse 65   Temp 98.2 F (36.8 C) (Oral)   Wt 205 lb 12.8 oz (93.4 kg)   SpO2 97%   BMI 35.60 kg/m   BP Readings from Last 3 Encounters:  08/21/23 134/72  02/19/23 (!) 142/86  01/14/23 128/82    Wt Readings from Last 3 Encounters:  08/21/23 205 lb 12.8 oz (93.4 kg)  02/19/23 218 lb (98.9 kg)  01/14/23 223 lb 6.4 oz (101.3 kg)    Physical Exam Vitals reviewed.  Constitutional:      Appearance: Normal appearance.  HENT:     Mouth/Throat:     Mouth: Mucous membranes are moist.  Eyes:     General: No scleral icterus.    Conjunctiva/sclera: Conjunctivae normal.  Cardiovascular:     Rate and Rhythm: Normal rate and regular rhythm.     Heart sounds: Murmur heard.     Systolic murmur is present with a grade of 1/6.     No friction rub. No gallop.     Comments: 1/6 RUSB SEM  EKG- NSR, 66 bpm Anterolateral infarct pattern is old No LVH Unchanged  Pulmonary:     Effort: Pulmonary effort is normal.     Breath sounds: No stridor. No wheezing, rhonchi or rales.  Abdominal:     General: Abdomen is flat.     Palpations: There is no mass.     Tenderness: There is no abdominal  tenderness. There is no guarding.     Hernia: No hernia is present.  Musculoskeletal:        General: Normal range of motion.     Cervical back: Neck supple.     Right lower leg: No edema.     Left lower leg: No edema.  Skin:    General: Skin is warm and dry.  Neurological:     Mental Status: She is alert. Mental status is at baseline.  Psychiatric:        Mood and Affect: Mood normal.        Behavior: Behavior normal.        Thought Content: Thought content normal.        Judgment: Judgment normal.     Lab Results  Component Value Date   WBC 4.9 08/21/2023   HGB 13.0 08/21/2023   HCT 38.8 08/21/2023   PLT 226.0 08/21/2023   GLUCOSE 92 08/21/2023   CHOL 165 02/19/2023   TRIG 104.0 02/19/2023   HDL 79.10 02/19/2023   LDLDIRECT 107.0 10/14/2019   LDLCALC 65 02/19/2023   ALT 26 02/19/2023   AST 20 02/19/2023   NA 141 08/21/2023   K 4.4 08/21/2023   CL 106 08/21/2023   CREATININE 1.53 (H) 08/21/2023   BUN 34 (H) 08/21/2023   CO2 28 08/21/2023   TSH 0.62 08/21/2023   HGBA1C 5.3 04/28/2009    DG Chest 2 View  Result Date: 05/30/2022 CLINICAL DATA:  Shortness of breath and chest pain EXAM: CHEST - 2 VIEW COMPARISON:  02/07/2021 FINDINGS: Chronic cardiomegaly and aortic atherosclerosis. Chronic lung markings with areas of linear scarring. No sign of active infiltrate, collapse or effusion. No frank pulmonary edema. No acute bone finding. IMPRESSION: Cardiomegaly. Aortic atherosclerosis. Chronic interstitial lung markings and pulmonary scarring. No active process identified. Electronically Signed   By: Paulina Fusi M.D.   On: 05/30/2022 15:27    Assessment & Plan:  Age-related osteoporosis without current pathological fracture -     Phosphorus; Future  Gastroesophageal reflux disease with esophagitis without hemorrhage -     CBC with Differential/Platelet; Future  Kidney disease, chronic, stage IV (GFR 15-29 ml/min) (HCC)- Renal function is stable. -     Urinalysis,  Routine w reflex microscopic; Future -     Basic metabolic panel; Future -     Phosphorus; Future  Essential hypertension- Her BP is well controlled. -     Basic metabolic panel; Future -     EKG 12-Lead  Chronic gout due to renal impairment of multiple sites without tophus -     Uric acid; Future  Other specified hypothyroidism- She is euthyroid. -     Thyroid Panel With TSH; Future -     Liothyronine Sodium; Take 1 tablet (25 mcg total) by mouth daily.  Dispense: 90 tablet; Refill: 1  DDD (degenerative disc disease), lumbar -     oxyCODONE HCl; Take 1 tablet (5 mg total) by mouth every 6 (six) hours as needed for severe pain (pain score 7-10).  Dispense: 100 tablet; Refill: 0  Spinal stenosis, lumbar region, with neurogenic claudication -     oxyCODONE HCl; Take 1 tablet (5 mg total) by mouth every 6 (six) hours as needed for severe pain (pain score 7-10).  Dispense: 100 tablet; Refill: 0  Primary osteoarthritis of both knees -     oxyCODONE HCl; Take 1 tablet (5 mg total) by mouth every 6 (six) hours as needed for severe pain (pain score 7-10).  Dispense: 100 tablet; Refill: 0     Follow-up: Return in about 6 months (around 02/19/2024).  Sanda Linger, MD

## 2023-08-22 LAB — THYROID PANEL WITH TSH
Free Thyroxine Index: 0.8 — ABNORMAL LOW (ref 1.4–3.8)
T3 Uptake: 32 % (ref 22–35)
T4, Total: 2.6 ug/dL — ABNORMAL LOW (ref 5.1–11.9)
TSH: 0.62 m[IU]/L (ref 0.40–4.50)

## 2023-08-22 MED ORDER — LIOTHYRONINE SODIUM 25 MCG PO TABS: 25.0000 ug | ORAL_TABLET | Freq: Every day | ORAL | 1 refills | Status: DC

## 2023-08-29 ENCOUNTER — Encounter: Payer: Self-pay | Admitting: Internal Medicine

## 2023-08-30 ENCOUNTER — Other Ambulatory Visit: Payer: Self-pay | Admitting: Internal Medicine

## 2023-08-30 DIAGNOSIS — F418 Other specified anxiety disorders: Secondary | ICD-10-CM

## 2023-08-30 DIAGNOSIS — E785 Hyperlipidemia, unspecified: Secondary | ICD-10-CM

## 2023-08-30 DIAGNOSIS — I10 Essential (primary) hypertension: Secondary | ICD-10-CM

## 2023-09-02 ENCOUNTER — Other Ambulatory Visit: Payer: Self-pay | Admitting: Internal Medicine

## 2023-09-02 DIAGNOSIS — M810 Age-related osteoporosis without current pathological fracture: Secondary | ICD-10-CM

## 2023-09-02 DIAGNOSIS — E2839 Other primary ovarian failure: Secondary | ICD-10-CM

## 2023-09-13 ENCOUNTER — Other Ambulatory Visit: Payer: Medicare Other

## 2023-09-15 ENCOUNTER — Encounter: Payer: Self-pay | Admitting: Pharmacist

## 2023-09-15 NOTE — Progress Notes (Signed)
Pharmacy Quality Measure Review  This patient is appearing on a report for being at risk of failing the adherence measure for hypertension (ACEi/ARB) medications this calendar year.   Medication: irbesartan 150 mg Last fill date: 12/23 for 90 day supply  Insurance report was not up to date. No action needed at this time.   Jarrett Ables, PharmD PGY-1 Pharmacy Resident

## 2023-09-19 ENCOUNTER — Other Ambulatory Visit: Payer: Self-pay | Admitting: Internal Medicine

## 2023-09-19 DIAGNOSIS — I5032 Chronic diastolic (congestive) heart failure: Secondary | ICD-10-CM

## 2023-10-01 ENCOUNTER — Encounter: Payer: Self-pay | Admitting: Orthopedic Surgery

## 2023-10-02 ENCOUNTER — Telehealth: Payer: Self-pay | Admitting: Internal Medicine

## 2023-10-02 NOTE — Telephone Encounter (Signed)
 Patient is needing a call back regarding her bone density scheduling

## 2023-10-03 ENCOUNTER — Other Ambulatory Visit: Payer: Medicare Other

## 2023-10-03 ENCOUNTER — Other Ambulatory Visit: Payer: Self-pay | Admitting: Internal Medicine

## 2023-10-03 DIAGNOSIS — E2839 Other primary ovarian failure: Secondary | ICD-10-CM

## 2023-10-03 DIAGNOSIS — E213 Hyperparathyroidism, unspecified: Secondary | ICD-10-CM

## 2023-10-08 ENCOUNTER — Ambulatory Visit
Admission: RE | Admit: 2023-10-08 | Discharge: 2023-10-08 | Disposition: A | Discharge status: Home / Self Care | Payer: Medicare Other | Source: Ambulatory Visit | Attending: Orthopedic Surgery | Admitting: Orthopedic Surgery

## 2023-10-08 DIAGNOSIS — M5416 Radiculopathy, lumbar region: Secondary | ICD-10-CM

## 2023-10-08 DIAGNOSIS — M48061 Spinal stenosis, lumbar region without neurogenic claudication: Secondary | ICD-10-CM | POA: Diagnosis not present

## 2023-10-08 DIAGNOSIS — M47816 Spondylosis without myelopathy or radiculopathy, lumbar region: Secondary | ICD-10-CM

## 2023-10-08 DIAGNOSIS — M48062 Spinal stenosis, lumbar region with neurogenic claudication: Secondary | ICD-10-CM

## 2023-10-11 DIAGNOSIS — M5432 Sciatica, left side: Secondary | ICD-10-CM | POA: Diagnosis not present

## 2023-10-11 DIAGNOSIS — M4726 Other spondylosis with radiculopathy, lumbar region: Secondary | ICD-10-CM | POA: Diagnosis not present

## 2023-10-11 DIAGNOSIS — M48062 Spinal stenosis, lumbar region with neurogenic claudication: Secondary | ICD-10-CM | POA: Diagnosis not present

## 2023-10-15 DIAGNOSIS — K080 Exfoliation of teeth due to systemic causes: Secondary | ICD-10-CM | POA: Diagnosis not present

## 2023-11-04 DIAGNOSIS — M47816 Spondylosis without myelopathy or radiculopathy, lumbar region: Secondary | ICD-10-CM | POA: Diagnosis not present

## 2023-11-05 ENCOUNTER — Other Ambulatory Visit: Payer: Self-pay | Admitting: Internal Medicine

## 2023-11-05 DIAGNOSIS — E781 Pure hyperglyceridemia: Secondary | ICD-10-CM

## 2023-11-20 ENCOUNTER — Ambulatory Visit
Admission: RE | Admit: 2023-11-20 | Discharge: 2023-11-20 | Discharge status: Home / Self Care | Source: Ambulatory Visit | Attending: Internal Medicine | Admitting: Internal Medicine

## 2023-11-20 ENCOUNTER — Encounter: Payer: Self-pay | Admitting: Internal Medicine

## 2023-11-20 DIAGNOSIS — E2839 Other primary ovarian failure: Secondary | ICD-10-CM | POA: Diagnosis not present

## 2023-11-20 DIAGNOSIS — M810 Age-related osteoporosis without current pathological fracture: Secondary | ICD-10-CM

## 2023-11-25 DIAGNOSIS — M48062 Spinal stenosis, lumbar region with neurogenic claudication: Secondary | ICD-10-CM | POA: Diagnosis not present

## 2023-11-25 DIAGNOSIS — M47816 Spondylosis without myelopathy or radiculopathy, lumbar region: Secondary | ICD-10-CM | POA: Diagnosis not present

## 2023-12-05 ENCOUNTER — Encounter: Payer: Self-pay | Admitting: Internal Medicine

## 2023-12-05 ENCOUNTER — Other Ambulatory Visit: Payer: Self-pay

## 2023-12-05 DIAGNOSIS — M51369 Other intervertebral disc degeneration, lumbar region without mention of lumbar back pain or lower extremity pain: Secondary | ICD-10-CM

## 2023-12-05 DIAGNOSIS — M48062 Spinal stenosis, lumbar region with neurogenic claudication: Secondary | ICD-10-CM

## 2023-12-05 DIAGNOSIS — M170 Bilateral primary osteoarthritis of knee: Secondary | ICD-10-CM

## 2023-12-05 MED ORDER — OXYCODONE HCL 5 MG PO TABS
5.0000 mg | ORAL_TABLET | Freq: Four times a day (QID) | ORAL | 0 refills | Status: DC | PRN
Start: 2023-12-05 — End: 2024-02-19

## 2023-12-25 ENCOUNTER — Ambulatory Visit (INDEPENDENT_AMBULATORY_CARE_PROVIDER_SITE_OTHER): Payer: Medicare Other

## 2023-12-25 VITALS — Ht 63.0 in | Wt 195.0 lb

## 2023-12-25 DIAGNOSIS — Z0000 Encounter for general adult medical examination without abnormal findings: Secondary | ICD-10-CM | POA: Diagnosis not present

## 2023-12-25 NOTE — Patient Instructions (Signed)
 Leslie Edwards , Thank you for taking time to come for your Medicare Wellness Visit. I appreciate your ongoing commitment to your health goals. Please review the following plan we discussed and let me know if I can assist you in the future.   Referrals/Orders/Follow-Ups/Clinician Recommendations: Aim for 30 minutes of exercise or brisk walking, 6-8 glasses of water, and 5 servings of fruits and vegetables each day.   This is a list of the screening recommended for you and due dates:  Health Maintenance  Topic Date Due   DTaP/Tdap/Td vaccine (3 - Td or Tdap) 06/24/2022   COVID-19 Vaccine (5 - 2024-25 season) 05/19/2023   Flu Shot  04/17/2024   Medicare Annual Wellness Visit  12/24/2024   Pneumonia Vaccine  Completed   DEXA scan (bone density measurement)  Completed   Hepatitis C Screening  Completed   Zoster (Shingles) Vaccine  Completed   HPV Vaccine  Aged Out   Colon Cancer Screening  Discontinued    Advanced directives: (Copy Requested) Please bring a copy of your health care power of attorney and living will to the office to be added to your chart at your convenience. You can mail to [facility] [address] [place], [place] [address] or email to [email or web address]  Next Medicare Annual Wellness Visit scheduled for next year: Yes  Managing Pain Without Opioids Opioids are strong medicines used to treat moderate to severe pain. For some people, especially those who have long-term (chronic) pain, opioids may not be the best choice for pain management due to: Side effects like nausea, constipation, and sleepiness. The risk of addiction (opioid use disorder). The longer you take opioids, the greater your risk of addiction. Pain that lasts for more than 3 months is called chronic pain. Managing chronic pain usually requires more than one approach and is often provided by a team of health care providers working together (multidisciplinary approach). Pain management may  be done at a pain management center or pain clinic. How to manage pain without the use of opioids Use non-opioid medicines Non-opioid medicines for pain may include: Over-the-counter or prescription non-steroidal anti-inflammatory drugs (NSAIDs). These may be the first medicines used for pain. They work well for muscle and bone pain, and they reduce swelling. Acetaminophen. This over-the-counter medicine may work well for milder pain but not swelling. Antidepressants. These may be used to treat chronic pain. A certain type of antidepressant (tricyclics) is often used. These medicines are given in lower doses for pain than when used for depression. Anticonvulsants. These are usually used to treat seizures but may also reduce nerve (neuropathic) pain. Muscle relaxants. These relieve pain caused by sudden muscle tightening (spasms). You may also use a pain medicine that is applied to the skin as a patch, cream, or gel (topical analgesic), such as a numbing medicine. These may cause fewer side effects than medicines taken by mouth. Do certain therapies as directed Some therapies can help with pain management. They include: Physical therapy. You will do exercises to gain strength and flexibility. A physical therapist may teach you exercises to move and stretch parts of your body that are weak, stiff, or painful. You can learn these exercises at physical therapy visits and practice them at home. Physical therapy may also involve: Massage. Heat wraps or applying heat or cold to affected areas. Electrical signals that interrupt pain signals (transcutaneous electrical nerve stimulation, TENS). Weak lasers that reduce pain and swelling (low-level laser therapy). Signals from your body that  help you learn to regulate pain (biofeedback). Occupational therapy. This helps you to learn ways to function at home and work with less pain. Recreational therapy. This involves trying new activities or hobbies, such as  a physical activity or drawing. Mental health therapy, including: Cognitive behavioral therapy (CBT). This helps you learn coping skills for dealing with pain. Acceptance and commitment therapy (ACT) to change the way you think and react to pain. Relaxation therapies, including muscle relaxation exercises and mindfulness-based stress reduction. Pain management counseling. This may be individual, family, or group counseling.  Receive medical treatments Medical treatments for pain management include: Nerve block injections. These may include a pain blocker and anti-inflammatory medicines. You may have injections: Near the spine to relieve chronic back or neck pain. Into joints to relieve back or joint pain. Into nerve areas that supply a painful area to relieve body pain. Into muscles (trigger point injections) to relieve some painful muscle conditions. A medical device placed near your spine to help block pain signals and relieve nerve pain or chronic back pain (spinal cord stimulation device). Acupuncture. Follow these instructions at home Medicines Take over-the-counter and prescription medicines only as told by your health care provider. If you are taking pain medicine, ask your health care providers about possible side effects to watch out for. Do not drive or use heavy machinery while taking prescription opioid pain medicine. Lifestyle  Do not use drugs or alcohol to reduce pain. If you drink alcohol, limit how much you have to: 0-1 drink a day for women who are not pregnant. 0-2 drinks a day for men. Know how much alcohol is in a drink. In the [place] one drink equals one 12 oz bottle of beer (355 mL), one 5 oz glass of wine (148 mL), or one 1 oz glass of hard liquor (44 mL). Do not use any products that contain nicotine or tobacco. These products include cigarettes, chewing tobacco, and vaping devices, such as e-cigarettes. If you need help quitting, ask your health care  provider. Eat a healthy diet and maintain a healthy weight. Poor diet and excess weight may make pain worse. Eat foods that are high in fiber. These include fresh fruits and vegetables, whole grains, and beans. Limit foods that are high in fat and processed sugars, such as fried and sweet foods. Exercise regularly. Exercise lowers stress and may help relieve pain. Ask your health care provider what activities and exercises are safe for you. If your health care provider approves, join an exercise class that combines movement and stress reduction. Examples include yoga and tai chi. Get enough sleep. Lack of sleep may make pain worse. Lower stress as much as possible. Practice stress reduction techniques as told by your therapist. General instructions Work with all your pain management providers to find the treatments that work best for you. You are an important member of your pain management team. There are many things you can do to reduce pain on your own. Consider joining an online or in-person support group for people who have chronic pain. Keep all follow-up visits. This is important. Where to find more information You can find more information about managing pain without opioids from: American Academy of Pain Medicine: painmed.org Institute for Chronic Pain: instituteforchronicpain.org American Chronic Pain Association: theacpa.org Contact a health care provider if: You have side effects from pain medicine. Your pain gets worse or does not get better with treatments or home therapy. You are struggling with anxiety or depression. Summary Many types  of pain can be managed without opioids. Chronic pain may respond better to pain management without opioids. Pain is best managed when you and a team of health care providers work together. Pain management without opioids may include non-opioid medicines, medical treatments, physical therapy, mental health therapy, and lifestyle changes. Tell  your health care providers if your pain gets worse or is not being managed well enough. This information is not intended to replace advice given to you by your health care provider. Make sure you discuss any questions you have with your health care provider. Document Revised: 12/14/2020 Document Reviewed: 12/14/2020 Elsevier Patient Education  2024 [employer].

## 2023-12-25 NOTE — Progress Notes (Signed)
 Subjective:   Leslie Edwards is a 78 y.o. who presents for a Medicare Wellness preventive visit.  Visit Complete: Virtual I connected with  Leslie Edwards on 12/25/23 by a audio enabled telemedicine application and verified that I am speaking with the correct person using two identifiers.  Patient Location: Home  Provider Location: Office/Clinic  I discussed the limitations of evaluation and management by telemedicine. The patient expressed understanding and agreed to proceed.  Vital Signs: Because this visit was a virtual/telehealth visit, some criteria may be missing or patient reported. Any vitals not documented were not able to be obtained and vitals that have been documented are patient reported.  VideoDeclined- This patient declined Librarian, academic. Therefore the visit was completed with audio only.  Persons Participating in Visit: Patient.  AWV Questionnaire: No: Patient Medicare AWV questionnaire was not completed prior to this visit.  Cardiac Risk Factors include: advanced age (>57men, >71 women);hypertension;dyslipidemia;obesity (BMI >30kg/m2)     Objective:    Today's Vitals   12/25/23 1329  Weight: 195 lb (88.5 kg)  Height: 5\' 3"  (1.6 m)  PainSc: 5   PainLoc: Back   Body mass index is 34.54 kg/m.     12/25/2023    1:27 PM 12/24/2022    2:08 PM 05/30/2022    2:28 PM 12/18/2021    2:41 PM 12/22/2020   11:31 AM 05/01/2018    5:28 PM  Advanced Directives  Does Patient Have a Medical Advance Directive? Yes Yes No Yes Yes Yes  Type of Estate agent of [place];Living will Healthcare Power of [place];Living will  Healthcare Power of [employer] of [place];Living will  Copy of Healthcare Power of Attorney in Chart? No - copy requested No - copy requested  No - copy requested  No - copy requested  Would patient like information on creating a medical advance directive?   No - Patient declined        Current Medications (verified) Outpatient Encounter Medications as of 12/25/2023  Medication Sig   albuterol (PROVENTIL HFA;VENTOLIN HFA) 108 (90 Base) MCG/ACT inhaler Inhale 2 puffs into the lungs every 6 (six) hours as needed.   allopurinol (ZYLOPRIM) 100 MG tablet Take 1 tablet (100 mg total) by mouth daily.   Ascorbic Acid (VITAMIN C) 1000 MG tablet Take 1,000 mg by mouth daily.   b complex vitamins tablet Take 1 tablet by mouth daily.   baclofen (LIORESAL) 10 MG tablet Take 10 mg by mouth at bedtime.   buPROPion (WELLBUTRIN XL) 150 MG 24 hr tablet Take 3 tablets (450 mg total) by mouth daily.   diazepam (VALIUM) 5 MG tablet Take 5 mg by mouth as needed (When she recieves her injections).   empagliflozin (JARDIANCE) 10 MG TABS tablet TAKE ONE TABLET BY MOUTH DAILY BEFORE BREAKFAST   famotidine (PEPCID) 10 MG tablet Take 10 mg by mouth 2 (two) times daily as needed for heartburn or indigestion.   Fluticasone-Umeclidin-Vilant (TRELEGY ELLIPTA) 100-62.5-25 MCG/ACT AEPB Inhale 1 puff into the lungs daily. (Patient taking differently: Inhale 1 puff into the lungs as needed.)   ipratropium-albuterol (DUONEB) 0.5-2.5 (3) MG/3ML SOLN Take 3 mLs by nebulization as needed.   irbesartan (AVAPRO) 150 MG tablet Take 1 tablet (150 mg total) by mouth daily.   liothyronine (CYTOMEL) 25 MCG tablet Take 1 tablet (25 mcg total) by mouth daily.   mometasone (NASONEX) 50 MCG/ACT nasal spray Place 2 sprays into the nose as needed.   Multiple  Vitamin (MULTIVITAMIN) tablet Take 1 tablet by mouth daily.   omega-3 acid ethyl esters (LOVAZA) 1 g capsule TAKE TWO CAPSULES BY MOUTH TWICE DAILY   oxyCODONE (OXY IR/ROXICODONE) 5 MG immediate release tablet Take 1 tablet (5 mg total) by mouth every 6 (six) hours as needed for severe pain (pain score 7-10).   pravastatin (PRAVACHOL) 20 MG tablet Take 1 tablet (20 mg total) by mouth daily.   pregabalin (LYRICA) 25 MG capsule Take 25 mg by mouth daily. Can take up to 3  tablets daily.   pregabalin (LYRICA) 75 MG capsule Take 75 mg by mouth daily.   Vilazodone HCl (VIIBRYD) 40 MG TABS Take 1 tablet (40 mg total) by mouth daily.   No facility-administered encounter medications on file as of 12/25/2023.    Allergies (verified) Penicillins and Prochlorperazine edisylate   History: Past Medical History:  Diagnosis Date   Allergy    ANEMIA, B12 DEFICIENCY 01/28/2009   Qualifier: Diagnosis of  By: Lovell Sheehan MD, Balinda Quails    Anxiety    Asthma    GERD (gastroesophageal reflux disease)    Hypertension    Hypothyroidism    OSTEOPOROSIS 05/06/2007   Qualifier: Diagnosis of  By: Lovell Sheehan MD, John E    Parathyroid hormone excess (HCC) 08/31/2013   Secondary to low dietary calcium/calcium malabsorption    PLANTAR FASCIITIS, LEFT 07/05/2009   Qualifier: Diagnosis of  By: Lovell Sheehan MD, Balinda Quails    Rosacea 02/25/2009   Qualifier: Diagnosis of  By: Lovell Sheehan MD, John E    [place]    UNSPECIFIED IRON DEFICIENCY ANEMIA 01/28/2009   Qualifier: Diagnosis of  By: Lovell Sheehan MD, John E    VENOUS INSUFFICIENCY, CHRONIC 04/28/2007   Qualifier: Diagnosis of  By: Everett Graff     Past Surgical History:  Procedure Laterality Date   BREAST BIOPSY Left    BREAST EXCISIONAL BIOPSY Right    CARPAL TUNNEL RELEASE     KNEE ARTHROSCOPY     right   LIPOMA EXCISION  1965   axilary   nissan fundiplication     TONSILLECTOMY     Family History  Problem Relation Age of Onset   Cancer Mother        lung   Macular degeneration Father    Arthritis Father    Lymphoma Son        died age 53   Dementia Maternal Grandmother    Stroke Maternal Grandmother    Healthy Brother    Cancer Maternal Grandfather        esophageal cancer   Cancer Paternal Grandmother        leukemia   Healthy Brother    Cancer Maternal Uncle        colon   Cancer Maternal Aunt        esophageal cancer   Social History   Socioeconomic History   Marital status: Married    Spouse name: Not on file   Number of  children: 1   Years of education: Not on file   Highest education level: Master's degree (e.g., MA, MS, MEng, MEd, MSW, MBA)  Occupational History   Not on file  Tobacco Use   Smoking status: Former    Current packs/day: 0.00    Types: Cigarettes    Quit date: 01/22/1975    Years since quitting: 48.9    Passive exposure: Past   Smokeless tobacco: Never  Vaping Use   Vaping status: Never Used  Substance and Sexual Activity  Alcohol use: Yes    Alcohol/week: 1.0 standard drink of alcohol    Types: 1 Glasses of wine per week    Comment: 1-4 per week   Drug use: No   Sexual activity: Not Currently  Other Topics Concern   Not on file  Social History Narrative   Regular exercise: Not walking   Works 3 days per week at [employer]   Caffeine use: yes   Social Drivers of Corporate investment banker Strain: Low Risk  (12/25/2023)   Overall Financial Resource Strain (CARDIA)    Difficulty of Paying Living Expenses: Not hard at all  Recent Concern: Financial Resource Strain - Medium Risk (12/19/2023)   Overall Financial Resource Strain (CARDIA)    Difficulty of Paying Living Expenses: Somewhat hard  Food Insecurity: No Food Insecurity (12/25/2023)   Hunger Vital Sign    Worried About Running Out of Food in the Last Year: Never true    Ran Out of Food in the Last Year: Never true  Transportation Needs: No Transportation Needs (12/25/2023)   PRAPARE - Administrator, Civil Service (Medical): No    Lack of Transportation (Non-Medical): No  Physical Activity: Inactive (12/25/2023)   Exercise Vital Sign    Days of Exercise per Week: 0 days    Minutes of Exercise per Session: 0 min  Stress: Stress Concern Present (12/25/2023)   [employer] of Occupational Health - Occupational Stress Questionnaire    Feeling of Stress : Very much  Social Connections: Moderately Integrated (12/25/2023)   Social Connection and Isolation Panel [NHANES]    Frequency of  Communication with Friends and Family: More than three times a week    Frequency of Social Gatherings with Friends and Family: Twice a week    Attends Religious Services: Never    Database administrator or Organizations: Yes    Attends Engineer, structural: More than 4 times per year    Marital Status: Married    Tobacco Counseling Counseling given: No    Clinical Intake:  Pre-visit preparation completed: Yes  Pain : 0-10 Pain Score: 5  Pain Type: Chronic pain Pain Orientation: Lower Pain Descriptors / Indicators: Constant Pain Onset: More than a month ago Pain Relieving Factors: Advi; Tylenol.; Oxycodne Effect of Pain on Daily Activities: stretching  Pain Relieving Factors: Advi; Tylenol.; Oxycodne  BMI - recorded: 34.54 Nutritional Status: BMI > 30  Obese Nutritional Risks: None Diabetes: No  Lab Results  Component Value Date   HGBA1C 5.3 04/28/2009     How often do you need to have someone help you when you read instructions, pamphlets, or other written materials from your doctor or pharmacy?: 1 - Never  Interpreter Needed?: No  Information entered by :: Hassell Halim, CMA   Activities of Daily Living     12/25/2023    1:33 PM  In your present state of health, do you have any difficulty performing the following activities:  Hearing? 0  Vision? 0  Difficulty concentrating or making decisions? 0  Walking or climbing stairs? 1  Comment uses handrails  Dressing or bathing? 0  Doing errands, shopping? 0  Preparing Food and eating ? N  Using the Toilet? N  In the past six months, have you accidently leaked urine? N  Do you have problems with loss of bowel control? N  Managing your Medications? N  Managing your Finances? N  Housekeeping or managing your Housekeeping? N  Patient Care Team: Etta Grandchild, MD as PCP - General (Internal Medicine) Tonny Bollman, MD as PCP - Cardiology (Cardiology) Szabat, Vinnie Level, [facility] (Inactive) as Pharmacist  (Pharmacist) Holli Humbles, MD as Referring Physician (Ophthalmology)  Indicate any recent Medical Services you may have received from other than [facility] providers in the past year (date may be approximate).     Assessment:   This is a routine wellness examination for Leslie Edwards.  Hearing/Vision screen Hearing Screening - Comments:: Denies hearing difficulties   Vision Screening - Comments:: Wears rx glasses - up to date with routine eye exams with Dr Kathie Dike   Goals Addressed               This Visit's Progress     Patient Stated (pt-stated)        Patient stated she'd like to lose about 50lbs.       Depression Screen     12/25/2023    1:38 PM 12/24/2022    2:10 PM 08/21/2022   10:12 AM 12/18/2021    2:42 PM 12/18/2021    2:40 PM 11/15/2021   10:22 AM 08/16/2021   10:23 AM  PHQ 2/9 Scores  PHQ - 2 Score 2 4 0 0 0 1 1  PHQ- 9 Score 9 7 0   1 1    Fall Risk     12/25/2023    1:35 PM 12/24/2022    2:23 PM 12/21/2022   10:45 AM 12/18/2021    2:42 PM 01/26/2021   11:19 AM  Fall Risk   Falls in the past year? 1 1 1 1  0  Number falls in past yr: 1 1 1  0   Comment 6   back injury   Injury with Fall? 0 0 0 1   Risk for fall due to : History of fall(s);Impaired balance/gait   History of fall(s)   Follow up Falls evaluation completed;Falls prevention discussed Falls evaluation completed;Education provided  Falls evaluation completed;Education provided     MEDICARE RISK AT HOME:  Medicare Risk at Home Any stairs in or around the home?: Yes If so, are there any without handrails?: No Home free of loose throw rugs in walkways, pet beds, electrical cords, etc?: Yes Adequate lighting in your home to reduce risk of falls?: Yes Life alert?: No Use of a cane, walker or w/c?: No Grab bars in the bathroom?: Yes Shower chair or bench in shower?: No Elevated toilet seat or a handicapped toilet?: No  TIMED UP AND GO:  Was the test performed?  No  Cognitive Function: 6CIT completed         12/25/2023    1:36 PM 12/24/2022    2:25 PM  6CIT Screen  What Year? 0 points 0 points  What month? 0 points 0 points  What time? 0 points 0 points  Count back from 20 0 points 0 points  Months in reverse 0 points 0 points  Repeat phrase 2 points 0 points  Total Score 2 points 0 points    Immunizations Immunization History  Administered Date(s) Administered   Fluad Quad(high Dose 65+) 06/30/2019, 06/25/2023   Influenza Split 07/23/2011, 06/24/2012   Influenza Whole 07/18/2007, 06/14/2008, 06/13/2010   Influenza, High Dose Seasonal PF 07/24/2013, 06/12/2016, 07/02/2017, 07/03/2018   Influenza,inj,Quad PF,6+ Mos 06/03/2014, 06/30/2015   Influenza-Unspecified 07/20/2020, 06/29/2021, 07/05/2022   Moderna SARS-COV2 Booster Vaccination 06/17/2021   PFIZER(Purple Top)SARS-COV-2 Vaccination 10/07/2019, 10/28/2019, 07/02/2020   Pneumococcal Conjugate-13 07/24/2013   Pneumococcal Polysaccharide-23 06/30/2015, 11/09/2020  Td 12/17/1998   Tdap 06/24/2012   Zoster Recombinant(Shingrix) 03/18/2018, 08/07/2018   Zoster, Live 05/01/2011    Screening Tests Health Maintenance  Topic Date Due   DTaP/Tdap/Td (3 - Td or Tdap) 06/24/2022   COVID-19 Vaccine (5 - 2024-25 season) 05/19/2023   INFLUENZA VACCINE  04/17/2024   Medicare Annual Wellness (AWV)  12/24/2024   Pneumonia Vaccine 53+ Years old  Completed   DEXA SCAN  Completed   Hepatitis C Screening  Completed   Zoster Vaccines- Shingrix  Completed   HPV VACCINES  Aged Out   Colonoscopy  Discontinued    Health Maintenance  Health Maintenance Due  Topic Date Due   DTaP/Tdap/Td (3 - Td or Tdap) 06/24/2022   COVID-19 Vaccine (5 - 2024-25 season) 05/19/2023   Health Maintenance Items Addressed: 12/25/2023   Additional Screening:  Vision Screening: Recommended annual ophthalmology exams for early detection of glaucoma and other disorders of the eye.  Dental Screening: Recommended annual dental exams for proper oral  hygiene  Community Resource Referral / Chronic Care Management: CRR required this visit?  No   CCM required this visit?  No     Plan:     I have personally reviewed and noted the following in the patient's chart:   Medical and social history Use of alcohol, tobacco or illicit drugs  Current medications and supplements including opioid prescriptions. Patient is currently taking opioid prescriptions. Information provided to patient regarding non-opioid alternatives. Patient advised to discuss non-opioid treatment plan with their provider. Functional ability and status Nutritional status Physical activity Advanced directives List of other physicians Hospitalizations, surgeries, and ER visits in previous 12 months Vitals Screenings to include cognitive, depression, and falls Referrals and appointments  In addition, I have reviewed and discussed with patient certain preventive protocols, quality metrics, and best practice recommendations. A written personalized care plan for preventive services as well as general preventive health recommendations were provided to patient.     Darreld Mclean, CMA   12/25/2023   After Visit Summary: (MyChart) Due to this being a telephonic visit, the after visit summary with patients personalized plan was offered to patient via MyChart   Notes: Nothing significant to report at this time.

## 2024-01-07 DIAGNOSIS — M47816 Spondylosis without myelopathy or radiculopathy, lumbar region: Secondary | ICD-10-CM | POA: Diagnosis not present

## 2024-01-07 DIAGNOSIS — M79602 Pain in left arm: Secondary | ICD-10-CM | POA: Diagnosis not present

## 2024-01-07 DIAGNOSIS — M5412 Radiculopathy, cervical region: Secondary | ICD-10-CM | POA: Diagnosis not present

## 2024-01-07 DIAGNOSIS — M48062 Spinal stenosis, lumbar region with neurogenic claudication: Secondary | ICD-10-CM | POA: Diagnosis not present

## 2024-01-22 DIAGNOSIS — I129 Hypertensive chronic kidney disease with stage 1 through stage 4 chronic kidney disease, or unspecified chronic kidney disease: Secondary | ICD-10-CM | POA: Diagnosis not present

## 2024-01-22 DIAGNOSIS — N2581 Secondary hyperparathyroidism of renal origin: Secondary | ICD-10-CM | POA: Diagnosis not present

## 2024-01-22 DIAGNOSIS — N183 Chronic kidney disease, stage 3 unspecified: Secondary | ICD-10-CM | POA: Diagnosis not present

## 2024-01-22 LAB — BASIC METABOLIC PANEL WITH GFR
BUN: 35 — AB (ref 4–21)
CO2: 22 (ref 13–22)
Chloride: 107 (ref 99–108)
Creatinine: 1.4 — AB (ref 0.5–1.1)
Glucose: 85
Potassium: 4.9 meq/L (ref 3.5–5.1)
Sodium: 141 (ref 137–147)

## 2024-01-22 LAB — COMPREHENSIVE METABOLIC PANEL WITH GFR
Albumin: 4.6 (ref 3.5–5.0)
Calcium: 9.9 (ref 8.7–10.7)
eGFR: 38

## 2024-01-24 LAB — LAB REPORT - SCANNED
Albumin, Urine POC: <3
Microalb Creat Ratio: <5
PTH, Intact: 49

## 2024-01-28 ENCOUNTER — Other Ambulatory Visit: Payer: Self-pay | Admitting: Internal Medicine

## 2024-01-28 DIAGNOSIS — M1A39X0 Chronic gout due to renal impairment, multiple sites, without tophus (tophi): Secondary | ICD-10-CM

## 2024-01-29 NOTE — Progress Notes (Unsigned)
 Cardiology Office Note:    Date:  01/30/2024   ID:  Leslie Edwards, DOB Mar 20, 1946, MRN [id number]  PCP:  Arcadio Knuckles, MD   [facility] Providers Cardiologist:  Arnoldo Lapping, MD     Referring MD: Arcadio Knuckles, MD   Chief Complaint  Patient presents with   Shortness of Breath    History of Present Illness:    Leslie Edwards is a 78 y.o. female with a hx of hypertension, exertional dyspnea, chronic kidney disease, and obesity, presenting for follow-up evaluation.  Once her diuretic regimen was changed and diuretics were discontinued, her creatinine improved significantly.  Blood pressure has been well-controlled over time.  The patient is here alone today.  She is been doing pretty well from a cardiovascular standpoint.  She ambulates with a walker.  She is primarily limited by back problems.  She also has developed some cervical spine issues and reports numbness in her left arm.  She has been followed by neurosurgery.  The patient reports chronic exertional dyspnea.  This is unchanged over time.  She denies orthopnea, PND, or leg swelling.  She has had no chest pain or pressure.  She is compliant with her medications.  She does report occasional heart palpitations that occur without warning, about once per month.  They are not sustained.  She has had no other problems.   Current Medications: Current Meds  Medication Sig   albuterol  (PROVENTIL  HFA;VENTOLIN  HFA) 108 (90 Base) MCG/ACT inhaler Inhale 2 puffs into the lungs every 6 (six) hours as needed.   allopurinol  (ZYLOPRIM ) 100 MG tablet TAKE ONE TABLET BY MOUTH DAILY   Ascorbic Acid (VITAMIN C) 1000 MG tablet Take 1,000 mg by mouth daily.   b complex vitamins tablet Take 1 tablet by mouth daily.   baclofen  (LIORESAL ) 10 MG tablet Take 10 mg by mouth at bedtime.   buPROPion  (WELLBUTRIN  XL) 150 MG 24 hr tablet Take 3 tablets (450 mg total) by mouth daily.   diazepam (VALIUM) 5 MG tablet Take 5 mg by mouth as needed  (When she recieves her injections).   empagliflozin  (JARDIANCE ) 10 MG TABS tablet TAKE ONE TABLET BY MOUTH DAILY BEFORE BREAKFAST   famotidine (PEPCID) 10 MG tablet Take 10 mg by mouth 2 (two) times daily as needed for heartburn or indigestion.   ipratropium-albuterol  (DUONEB) 0.5-2.5 (3) MG/3ML SOLN Take 3 mLs by nebulization as needed.   irbesartan  (AVAPRO ) 150 MG tablet Take 1 tablet (150 mg total) by mouth daily.   liothyronine  (CYTOMEL ) 25 MCG tablet Take 1 tablet (25 mcg total) by mouth daily.   mometasone  (NASONEX ) 50 MCG/ACT nasal spray Place 2 sprays into the nose as needed.   Multiple Vitamin (MULTIVITAMIN) tablet Take 1 tablet by mouth daily.   omega-3 acid ethyl esters (LOVAZA ) 1 g capsule TAKE TWO CAPSULES BY MOUTH TWICE DAILY   oxyCODONE  (OXY IR/ROXICODONE ) 5 MG immediate release tablet Take 1 tablet (5 mg total) by mouth every 6 (six) hours as needed for severe pain (pain score 7-10).   pravastatin  (PRAVACHOL ) 20 MG tablet Take 1 tablet (20 mg total) by mouth daily.   pregabalin (LYRICA) 25 MG capsule Take 25 mg by mouth daily. Can take up to 3 tablets daily.   pregabalin (LYRICA) 75 MG capsule Take 75 mg by mouth at bedtime.   Vilazodone  HCl (VIIBRYD ) 40 MG TABS Take 1 tablet (40 mg total) by mouth daily.   [DISCONTINUED] Fluticasone -Umeclidin-Vilant (TRELEGY ELLIPTA ) 100-62.5-25 MCG/ACT AEPB Inhale 1  puff into the lungs daily. (Patient taking differently: Inhale 1 puff into the lungs as needed.)     Allergies:   Penicillins and Prochlorperazine edisylate   ROS:   Please see the history of present illness.    All other systems reviewed and are negative.  EKGs/Labs/Other Studies Reviewed:    The following studies were reviewed today: Cardiac Studies & Procedures   ______________________________________________________________________________________________   STRESS TESTS  MYOCARDIAL PERFUSION IMAGING 05/25/2022  Narrative   Findings are consistent with ischemia. The  study is low risk.   No ST deviation was noted.   LV perfusion is abnormal. Defect 1: There is a small defect with mild reduction in uptake present in the basal inferior, inferolateral and inferoseptal location(s) that is partially reversible. There is abnormal wall motion in the defect area. Consistent with ischemia.   Left ventricular function is normal. Nuclear stress EF: 55 %. The left ventricular ejection fraction is normal (55-65%). End diastolic cavity size is mildly enlarged. End systolic cavity size is normal.   Prior study not available for comparison.  Small area in the basal inferior, inferoseptal, and inferolateral wall with reduced perfusion at rest, slightly worse with stress. There is focal wall motion abnormalities in the inferoseptum with overall preserved EF. There is significant extracardiac activity, so artifact cannot be excluded, but with wall motion abnormality and decreased perfusion with stress, this suggests ischemia. Overall mild and small area, so low risk study.   ECHOCARDIOGRAM  ECHOCARDIOGRAM COMPLETE 07/12/2022  Narrative ECHOCARDIOGRAM REPORT    Patient Name:   Leslie Edwards Pelley Date of Exam: 07/12/2022 Medical Rec #:  [id number]       Height:       63.5 in Accession #:    [id number]      Weight:       242.6 lb Date of Birth:  03-Jun-1946       BSA:          2.111 m Patient Age:    76 years        BP:           118/64 mmHg Patient Gender: F               HR:           71 bpm. Exam Location:  [address]  Procedure: 2D Echo, Cardiac Doppler and Color Doppler  Indications:    R06.02 SOB  History:        Patient has prior history of Echocardiogram examinations, most recent 11/27/2018. Abnormal ECG, Signs/Symptoms:Murmur; Risk Factors:Hypertension and Dyslipidemia. LVH. Diastolic dysfunction. Asthma. Chronic kidney disease. Morbid obesity. Dyspnea on exertion.  Sonographer:    Mylinda Asa RCS Referring Phys: [phone] Marilu Rylander  IMPRESSIONS   1.  Left ventricular ejection fraction, by estimation, is 55 to 60%. The left ventricle has normal function. The left ventricle has no regional wall motion abnormalities. Left ventricular diastolic parameters are consistent with Grade I diastolic dysfunction (impaired relaxation). 2. Right ventricular systolic function is normal. The right ventricular size is normal. Tricuspid regurgitation signal is inadequate for assessing PA pressure. 3. The mitral valve is degenerative. Trivial mitral valve regurgitation. No evidence of mitral stenosis. Moderate mitral annular calcification. 4. The aortic valve is tricuspid. Aortic valve regurgitation is not visualized. No aortic stenosis is present. 5. The inferior vena cava is normal in size with greater than 50% respiratory variability, suggesting right atrial pressure of 3 mmHg.  FINDINGS Left Ventricle: Left ventricular ejection fraction, by estimation,  is 55 to 60%. The left ventricle has normal function. The left ventricle has no regional wall motion abnormalities. The left ventricular internal cavity size was normal in size. There is no left ventricular hypertrophy. Left ventricular diastolic parameters are consistent with Grade I diastolic dysfunction (impaired relaxation).  Right Ventricle: The right ventricular size is normal. No increase in right ventricular wall thickness. Right ventricular systolic function is normal. Tricuspid regurgitation signal is inadequate for assessing PA pressure.  Left Atrium: Left atrial size was normal in size.  Right Atrium: Right atrial size was normal in size.  Pericardium: There is no evidence of pericardial effusion.  Mitral Valve: The mitral valve is degenerative in appearance. There is mild calcification of the mitral valve leaflet(s). Moderate mitral annular calcification. Trivial mitral valve regurgitation. No evidence of mitral valve stenosis.  Tricuspid Valve: The tricuspid valve is normal in structure.  Tricuspid valve regurgitation is not demonstrated.  Aortic Valve: The aortic valve is tricuspid. Aortic valve regurgitation is not visualized. No aortic stenosis is present.  Pulmonic Valve: The pulmonic valve was normal in structure. Pulmonic valve regurgitation is not visualized.  Aorta: The aortic root is normal in size and structure.  Venous: The inferior vena cava is normal in size with greater than 50% respiratory variability, suggesting right atrial pressure of 3 mmHg.  IAS/Shunts: No atrial level shunt detected by color flow Doppler.   LEFT VENTRICLE PLAX 2D LVIDd:         5.10 cm   Diastology LVIDs:         3.80 cm   LV e' medial:    6.96 cm/s LV PW:         1.20 cm   LV E/e' medial:  14.9 LV IVS:        1.00 cm   LV e' lateral:   8.38 cm/s LVOT diam:     2.10 cm   LV E/e' lateral: 12.4 LV SV:         105 LV SV Index:   50 LVOT Area:     3.46 cm   RIGHT VENTRICLE RV S prime:     14.90 cm/s TAPSE (M-mode): 1.5 cm  LEFT ATRIUM             Index LA diam:        4.10 cm 1.94 cm/m LA Vol (A2C):   44.7 ml 21.17 ml/m LA Vol (A4C):   47.1 ml 22.31 ml/m LA Biplane Vol: 47.0 ml 22.26 ml/m AORTIC VALVE LVOT Vmax:   145.00 cm/s LVOT Vmean:  88.800 cm/s LVOT VTI:    0.302 m  AORTA Ao Root diam: 3.50 cm Ao Asc diam:  3.40 cm  MITRAL VALVE MV Area (PHT): 2.01 cm     SHUNTS MV Decel Time: 377 msec     Systemic VTI:  0.30 m MV E velocity: 104.00 cm/s  Systemic Diam: 2.10 cm MV A velocity: 140.00 cm/s MV E/A ratio:  0.74  Dalton McleanMD Electronically signed by Archer Bear Signature Date/Time: 07/12/2022/4:29:20 PM    Final          ______________________________________________________________________________________________      EKG:        Recent Labs: 02/19/2023: ALT 26 08/21/2023: BUN 34; Creatinine, Ser 1.53; Hemoglobin 13.0; Platelets 226.0; Potassium 4.4; Sodium 141; TSH 0.62  Recent Lipid Panel    Component Value Date/Time   CHOL 165  02/19/2023 1100   TRIG 104.0 02/19/2023 1100   HDL 79.10 02/19/2023 1100   CHOLHDL  2 02/19/2023 1100   VLDL 20.8 02/19/2023 1100   LDLCALC 65 02/19/2023 1100   LDLDIRECT 107.0 10/14/2019 1155     Risk Assessment/Calculations:                Physical Exam:    VS:  BP 126/74   Pulse 67   Ht 5' 3.25" (1.607 m)   Wt 200 lb 12.8 oz (91.1 kg)   SpO2 94%   BMI 35.29 kg/m     Wt Readings from Last 3 Encounters:  01/30/24 200 lb 12.8 oz (91.1 kg)  12/25/23 195 lb (88.5 kg)  08/21/23 205 lb 12.8 oz (93.4 kg)     GEN:  Well nourished, well developed in no acute distress HEENT: Normal NECK: No JVD; No carotid bruits LYMPHATICS: No lymphadenopathy CARDIAC: RRR, soft systolic murmur at the right upper sternal border RESPIRATORY:  Clear to auscultation without rales, wheezing or rhonchi  ABDOMEN: Soft, non-tender, non-distended MUSCULOSKELETAL:  No edema; No deformity  SKIN: Warm and dry NEUROLOGIC:  Alert and oriented x 3 PSYCHIATRIC:  Normal affect   Assessment & Plan DOE (dyspnea on exertion) Unchanged with no signs of heart failure.  Last echo from October 2023 reviewed with LVEF 55 to 60%, grade 1 diastolic dysfunction, no regional wall motion abnormalities, normal RV function, and no significant valvular disease. BMI 35.0-35.9,adult The patient has lost weight over the past few years.  She will continue to work on this.  She follows a low carbohydrate diet. Essential hypertension Blood pressure is under optimal control on irbesartan .  Will continue.  Labs reviewed with a creatinine of 1.53 and potassium of 4.4.  She is also followed at [facility]. Aortic atherosclerosis (HCC) Treated with statin drug.  Lipids reviewed with LDL cholesterol 65.       Medication Adjustments/Labs and Tests Ordered: Current medicines are reviewed at length with the patient today.  Concerns regarding medicines are outlined above.  No orders of the defined types were placed in this  encounter.  No orders of the defined types were placed in this encounter.   Patient Instructions   Follow-Up: At [facility], you and your health needs are our priority.  As part of our continuing mission to provide you with exceptional heart care, our providers are all part of one team.  This team includes your primary Cardiologist (physician) and Advanced Practice Providers or APPs (Physician Assistants and Nurse Practitioners) who all work together to provide you with the care you need, when you need it.  Your next appointment:   1 year(s)  Provider:   Arnoldo Lapping, MD       Signed, Arnoldo Lapping, MD  01/30/2024 12:28 PM     [facility]

## 2024-01-30 ENCOUNTER — Encounter: Payer: Self-pay | Admitting: Cardiovascular Disease

## 2024-01-30 ENCOUNTER — Ambulatory Visit: Payer: Medicare Other | Attending: Cardiovascular Disease | Admitting: Cardiovascular Disease

## 2024-01-30 VITALS — BP 126/74 | HR 67 | Ht 63.25 in | Wt 200.8 lb

## 2024-01-30 DIAGNOSIS — I700 Atherosclerosis of aorta: Secondary | ICD-10-CM

## 2024-01-30 DIAGNOSIS — R0609 Other forms of dyspnea: Secondary | ICD-10-CM | POA: Diagnosis not present

## 2024-01-30 DIAGNOSIS — Z6835 Body mass index (BMI) 35.0-35.9, adult: Secondary | ICD-10-CM

## 2024-01-30 DIAGNOSIS — I10 Essential (primary) hypertension: Secondary | ICD-10-CM | POA: Diagnosis not present

## 2024-01-30 NOTE — Assessment & Plan Note (Signed)
 Blood pressure is under optimal control on irbesartan .  Will continue.  Labs reviewed with a creatinine of 1.53 and potassium of 4.4.  She is also followed at [facility].

## 2024-01-30 NOTE — Patient Instructions (Signed)
 Follow-Up: At [facility], you and your health needs are our priority.  As part of our continuing mission to provide you with exceptional heart care, our providers are all part of one team.  This team includes your primary Cardiologist (physician) and Advanced Practice Providers or APPs (Physician Assistants and Nurse Practitioners) who all work together to provide you with the care you need, when you need it.  Your next appointment:   1 year(s)  Provider:   Arnoldo Lapping, MD

## 2024-02-04 DIAGNOSIS — M48062 Spinal stenosis, lumbar region with neurogenic claudication: Secondary | ICD-10-CM | POA: Diagnosis not present

## 2024-02-05 ENCOUNTER — Encounter: Payer: Self-pay | Admitting: Cardiovascular Disease

## 2024-02-17 DIAGNOSIS — M25512 Pain in left shoulder: Secondary | ICD-10-CM | POA: Diagnosis not present

## 2024-02-19 ENCOUNTER — Ambulatory Visit: Payer: Medicare Other | Admitting: Internal Medicine

## 2024-02-19 ENCOUNTER — Encounter: Payer: Self-pay | Admitting: Internal Medicine

## 2024-02-19 VITALS — BP 140/76 | HR 66 | Temp 98.7°F | Resp 16 | Ht 63.25 in | Wt 198.2 lb

## 2024-02-19 DIAGNOSIS — N1832 Chronic kidney disease, stage 3b: Secondary | ICD-10-CM

## 2024-02-19 DIAGNOSIS — E785 Hyperlipidemia, unspecified: Secondary | ICD-10-CM | POA: Diagnosis not present

## 2024-02-19 DIAGNOSIS — E781 Pure hyperglyceridemia: Secondary | ICD-10-CM

## 2024-02-19 DIAGNOSIS — M51369 Other intervertebral disc degeneration, lumbar region without mention of lumbar back pain or lower extremity pain: Secondary | ICD-10-CM

## 2024-02-19 DIAGNOSIS — Z0001 Encounter for general adult medical examination with abnormal findings: Secondary | ICD-10-CM | POA: Diagnosis not present

## 2024-02-19 DIAGNOSIS — L989 Disorder of the skin and subcutaneous tissue, unspecified: Secondary | ICD-10-CM | POA: Diagnosis not present

## 2024-02-19 DIAGNOSIS — M48062 Spinal stenosis, lumbar region with neurogenic claudication: Secondary | ICD-10-CM

## 2024-02-19 DIAGNOSIS — I10 Essential (primary) hypertension: Secondary | ICD-10-CM | POA: Diagnosis not present

## 2024-02-19 DIAGNOSIS — M1A39X0 Chronic gout due to renal impairment, multiple sites, without tophus (tophi): Secondary | ICD-10-CM

## 2024-02-19 DIAGNOSIS — E038 Other specified hypothyroidism: Secondary | ICD-10-CM

## 2024-02-19 DIAGNOSIS — F3341 Major depressive disorder, recurrent, in partial remission: Secondary | ICD-10-CM

## 2024-02-19 DIAGNOSIS — I5032 Chronic diastolic (congestive) heart failure: Secondary | ICD-10-CM

## 2024-02-19 DIAGNOSIS — M170 Bilateral primary osteoarthritis of knee: Secondary | ICD-10-CM

## 2024-02-19 LAB — CBC WITH DIFFERENTIAL/PLATELET
Basophils Absolute: 0.1 10*3/uL (ref 0.0–0.1)
Basophils Relative: 1.1 % (ref 0.0–3.0)
Eosinophils Absolute: 0.6 10*3/uL (ref 0.0–0.7)
Eosinophils Relative: 10.4 % — ABNORMAL HIGH (ref 0.0–5.0)
HCT: 39.2 % (ref 36.0–46.0)
Hemoglobin: 13.2 g/dL (ref 12.0–15.0)
Lymphocytes Relative: 28.7 % (ref 12.0–46.0)
Lymphs Abs: 1.8 10*3/uL (ref 0.7–4.0)
MCHC: 33.5 g/dL (ref 30.0–36.0)
MCV: 98.7 fl (ref 78.0–100.0)
Monocytes Absolute: 0.5 10*3/uL (ref 0.1–1.0)
Monocytes Relative: 7.6 % (ref 3.0–12.0)
Neutro Abs: 3.2 10*3/uL (ref 1.4–7.7)
Neutrophils Relative %: 52.2 % (ref 43.0–77.0)
Platelets: 207 10*3/uL (ref 150.0–400.0)
RBC: 3.97 Mil/uL (ref 3.87–5.11)
RDW: 13.9 % (ref 11.5–15.5)
WBC: 6.1 10*3/uL (ref 4.0–10.5)

## 2024-02-19 LAB — HEPATIC FUNCTION PANEL
ALT: 15 U/L (ref 0–35)
AST: 19 U/L (ref 0–37)
Albumin: 4.6 g/dL (ref 3.5–5.2)
Alkaline Phosphatase: 65 U/L (ref 39–117)
Bilirubin, Direct: 0.1 mg/dL (ref 0.0–0.3)
Total Bilirubin: 0.6 mg/dL (ref 0.2–1.2)
Total Protein: 7.1 g/dL (ref 6.0–8.3)

## 2024-02-19 LAB — LIPID PANEL
Cholesterol: 147 mg/dL (ref 0–200)
HDL: 78.2 mg/dL (ref >39.00)
LDL Cholesterol: 54 mg/dL (ref 0–99)
NonHDL: 69.19
Total CHOL/HDL Ratio: 2
Triglycerides: 78 mg/dL (ref 0.0–149.0)
VLDL: 15.6 mg/dL (ref 0.0–40.0)

## 2024-02-19 LAB — URIC ACID: Uric Acid, Serum: 5.1 mg/dL (ref 2.4–7.0)

## 2024-02-19 LAB — TSH: TSH: 1.14 u[IU]/mL (ref 0.35–5.50)

## 2024-02-19 MED ORDER — EMPAGLIFLOZIN 10 MG PO TABS
10.0000 mg | ORAL_TABLET | Freq: Every day | ORAL | 1 refills | Status: DC
Start: 2024-02-19 — End: 2024-06-01

## 2024-02-19 MED ORDER — IRBESARTAN 150 MG PO TABS
150.0000 mg | ORAL_TABLET | Freq: Every day | ORAL | 1 refills | Status: DC
Start: 2024-02-19 — End: 2024-06-29

## 2024-02-19 MED ORDER — OXYCODONE HCL 5 MG PO TABS
5.0000 mg | ORAL_TABLET | Freq: Four times a day (QID) | ORAL | 0 refills | Status: DC | PRN
Start: 2024-02-19 — End: 2024-04-30

## 2024-02-19 MED ORDER — BUPROPION HCL ER (XL) 150 MG PO TB24: 450.0000 mg | ORAL_TABLET | Freq: Every day | ORAL | 1 refills | Status: AC

## 2024-02-19 MED ORDER — LIOTHYRONINE SODIUM 25 MCG PO TABS: 25.0000 ug | ORAL_TABLET | Freq: Every day | ORAL | 1 refills | Status: DC

## 2024-02-19 MED ORDER — VILAZODONE HCL 40 MG PO TABS: 40.0000 mg | ORAL_TABLET | Freq: Every day | ORAL | 1 refills | Status: DC

## 2024-02-19 MED ORDER — PRAVASTATIN SODIUM 20 MG PO TABS: 20.0000 mg | ORAL_TABLET | Freq: Every day | ORAL | 1 refills | Status: DC

## 2024-02-19 NOTE — Patient Instructions (Signed)

## 2024-02-19 NOTE — Progress Notes (Signed)
 Subjective:  Patient ID: Leslie Edwards, female    DOB: 20-Oct-1945  Age: 78 y.o. MRN: [id number]  CC: Back Pain (Sciatic nerve pain ), Hypertension, Hyperlipidemia, Annual Exam, and Osteoarthritis   HPI Leslie Edwards presents for a CPX and f/up ---  Discussed the use of AI scribe software for clinical note transcription with the patient, who gave verbal consent to proceed.  History of Present Illness   Leslie Edwards is a 78 year old female who presents with joint pain and numbness in the left arm.  She experiences excruciating pain in all joints, particularly severe upon waking in the morning, often waking her from sleep. She is currently taking Lyrica and oxycodone  for pain management, which she states is the only thing that helps.  She describes numbness in her left arm as a 'weird numb' sensation, where it feels asleep and numb, yet she can still feel needles if pricked. The numbness and pain extend down her arm. She has a history of spinal scoliosis, which she believes may be contributing to her symptoms.  During a visit to physical therapy, she fell on a hard tile floor, hitting her head on a counter. Following the fall, she reports pain in her sacral area, referred to as 'sense bones'.  She mentions bruising easily and has a knot on her arm that has been present for two months, unrelated to any falls. No cardiovascular symptoms such as chest pain, shortness of breath, dizziness, or lightheadedness.       Outpatient Medications Prior to Visit  Medication Sig Dispense Refill   albuterol  (PROVENTIL  HFA;VENTOLIN  HFA) 108 (90 Base) MCG/ACT inhaler Inhale 2 puffs into the lungs every 6 (six) hours as needed. 18 g 11   allopurinol  (ZYLOPRIM ) 100 MG tablet TAKE ONE TABLET BY MOUTH DAILY 90 tablet 1   Ascorbic Acid (VITAMIN C) 1000 MG tablet Take 1,000 mg by mouth daily.     b complex vitamins tablet Take 1 tablet by mouth daily.     baclofen  (LIORESAL ) 10 MG tablet Take 10 mg  by mouth at bedtime.     diazepam (VALIUM) 5 MG tablet Take 5 mg by mouth as needed (When she recieves her injections).     famotidine (PEPCID) 10 MG tablet Take 10 mg by mouth 2 (two) times daily as needed for heartburn or indigestion.     ipratropium-albuterol  (DUONEB) 0.5-2.5 (3) MG/3ML SOLN Take 3 mLs by nebulization as needed. 360 mL 1   mometasone  (NASONEX ) 50 MCG/ACT nasal spray Place 2 sprays into the nose as needed. 3 each 1   Multiple Vitamin (MULTIVITAMIN) tablet Take 1 tablet by mouth daily.     omega-3 acid ethyl esters (LOVAZA ) 1 g capsule TAKE TWO CAPSULES BY MOUTH TWICE DAILY 360 capsule 0   pregabalin (LYRICA) 25 MG capsule Take 25 mg by mouth daily. Can take up to 3 tablets daily.     pregabalin (LYRICA) 75 MG capsule Take 75 mg by mouth at bedtime.     buPROPion  (WELLBUTRIN  XL) 150 MG 24 hr tablet Take 3 tablets (450 mg total) by mouth daily. 270 tablet 1   empagliflozin  (JARDIANCE ) 10 MG TABS tablet TAKE ONE TABLET BY MOUTH DAILY BEFORE BREAKFAST 90 tablet 1   irbesartan  (AVAPRO ) 150 MG tablet Take 1 tablet (150 mg total) by mouth daily. 90 tablet 1   liothyronine  (CYTOMEL ) 25 MCG tablet Take 1 tablet (25 mcg total) by mouth daily. 90 tablet 1  oxyCODONE  (OXY IR/ROXICODONE ) 5 MG immediate release tablet Take 1 tablet (5 mg total) by mouth every 6 (six) hours as needed for severe pain (pain score 7-10). 100 tablet 0   pravastatin  (PRAVACHOL ) 20 MG tablet Take 1 tablet (20 mg total) by mouth daily. 90 tablet 1   Vilazodone  HCl (VIIBRYD ) 40 MG TABS Take 1 tablet (40 mg total) by mouth daily. 90 tablet 1   No facility-administered medications prior to visit.    ROS Review of Systems  Constitutional:  Negative for appetite change, chills, diaphoresis, fatigue and fever.  HENT: Negative.    Respiratory: Negative.  Negative for cough, chest tightness, shortness of breath and wheezing.   Cardiovascular:  Negative for chest pain, palpitations and leg swelling.  Gastrointestinal:   Negative for abdominal pain, constipation, diarrhea, nausea and vomiting.  Genitourinary: Negative.  Negative for difficulty urinating.  Musculoskeletal:  Positive for arthralgias, back pain and gait problem. Negative for myalgias and neck pain.  Skin: Negative.   Neurological:  Positive for numbness. Negative for dizziness, weakness, light-headedness and headaches.  Hematological:  Negative for adenopathy. Does not bruise/bleed easily.  Psychiatric/Behavioral: Negative.      Objective:  BP (!) 140/76 (BP Location: Left Arm, Patient Position: Sitting, Cuff Size: Normal)   Pulse 66   Temp 98.7 F (37.1 C) (Temporal)   Resp 16   Ht 5' 3.25" (1.607 m)   Wt 198 lb 3.2 oz (89.9 kg)   SpO2 96%   BMI 34.83 kg/m   BP Readings from Last 3 Encounters:  02/19/24 (!) 140/76  01/30/24 126/74  08/21/23 134/72    Wt Readings from Last 3 Encounters:  02/19/24 198 lb 3.2 oz (89.9 kg)  01/30/24 200 lb 12.8 oz (91.1 kg)  12/25/23 195 lb (88.5 kg)    Physical Exam Vitals reviewed.  Constitutional:      Appearance: Normal appearance.  Eyes:     General: No scleral icterus.    Conjunctiva/sclera: Conjunctivae normal.  Cardiovascular:     Rate and Rhythm: Normal rate and regular rhythm.     Heart sounds: Murmur heard.     Systolic murmur is present with a grade of 1/6.     No friction rub. No gallop.     Comments: 1/6 SEM RUSB Pulmonary:     Effort: Pulmonary effort is normal.     Breath sounds: No stridor. No wheezing, rhonchi or rales.  Abdominal:     Palpations: There is no mass.     Tenderness: There is no abdominal tenderness. There is no guarding.     Hernia: No hernia is present.  Musculoskeletal:        General: Normal range of motion.     Cervical back: Neck supple.     Right lower leg: No edema.     Left lower leg: No edema.  Lymphadenopathy:     Cervical: No cervical adenopathy.  Skin:    General: Skin is warm and dry.     Findings: Bruising present. No lesion or  rash.  Neurological:     General: No focal deficit present.     Mental Status: She is alert.  Psychiatric:        Mood and Affect: Mood normal.        Behavior: Behavior normal.     Lab Results  Component Value Date   WBC 6.1 02/19/2024   HGB 13.2 02/19/2024   HCT 39.2 02/19/2024   PLT 207.0 02/19/2024   GLUCOSE 92 08/21/2023  CHOL 147 02/19/2024   TRIG 78.0 02/19/2024   HDL 78.20 02/19/2024   LDLDIRECT 107.0 10/14/2019   LDLCALC 54 02/19/2024   ALT 15 02/19/2024   AST 19 02/19/2024   NA 141 01/22/2024   K 4.9 01/22/2024   CL 107 01/22/2024   CREATININE 1.4 (A) 01/22/2024   BUN 35 (A) 01/22/2024   CO2 22 01/22/2024   TSH 1.14 02/19/2024   HGBA1C 5.3 04/28/2009    DG Bone Density Result Date: 11/20/2023 Table formatting from the original result was not included. Date of study: 11/20/2023 Exam: DUAL X-RAY ABSORPTIOMETRY (DXA) FOR BONE MINERAL DENSITY (BMD) Instrument: [employer] Requesting Provider: PCP Indication: follow up for osteoporosis in a patient with history of primary hyperparathyroidism Comparison: 11/03/2015 Clinical data: Pt is a 78 y.o. female without previous history of fracture. On vitamin D . Results:  Lumbar spine L1-L4 (L3) Femoral neck (FN) 33% left distal radius Ultra distal left radius T-score +1.1% RFN: -2.0 LFN: -1.8 -3.4 -3.1 Change in BMD from previous DXA test (%) +13.5%* +2.9% n/a n/a (*) statistically significant Assessment: Patient has osteoporosis according to the [facility] classification for osteoporosis (see below). Fracture risk: high Comments: the technical quality of the study is good, however, the spine is scoliotic and arthritic. Calcium  accumulation in arthritic sites can confound the results of the bone density scan.  Also, L3 vertebra had to be excluded from analysis due to degenerative changes. Ultradistal radius can be used as a surrogate site for BMD analysis of trabecular bone instead of the spine. Evaluation for secondary causes should be  considered if clinically indicated. Recommend optimizing calcium  (1200 mg/day) and vitamin D  (800 IU/day) intake. Followup: Repeat BMD is appropriate after 2 years or after 1-2 years if starting treatment. WHO criteria for diagnosis of osteoporosis in postmenopausal women and in men 71 y/o or older: - normal: T-score -1.0 to + 1.0 - osteopenia/low bone density: T-score between -2.5 and -1.0 - osteoporosis: T-score below -2.5 - severe osteoporosis: T-score below -2.5 with history of fragility fracture Note: although not part of the WHO classification, the presence of a fragility fracture, regardless of the T-score, should be considered diagnostic of osteoporosis, provided other causes for the fracture have been excluded. Treatment: The National Osteoporosis Foundation recommends that treatment be considered in postmenopausal women and men age 50 or older with: 1. Hip or vertebral (clinical or morphometric) fracture 2. T-score of - 2.5 or lower at the spine or hip 3. 10-year fracture probability by FRAX of at least 20% for a major osteoporotic fracture and 3% for a hip fracture Emilie Harden, MD [facility]    Assessment & Plan:  Other specified hypothyroidism- She is euthyroid -     TSH; Future -     Liothyronine  Sodium; Take 1 tablet (25 mcg total) by mouth daily.  Dispense: 90 tablet; Refill: 1  Pure hyperglyceridemia -     Lipid panel; Future  Chronic gout due to renal impairment of multiple sites without tophus -     Uric acid; Future  Hyperlipidemia with target LDL less than 100 -     Lipid panel; Future -     Hepatic function panel; Future -     Pravastatin  Sodium; Take 1 tablet (20 mg total) by mouth daily.  Dispense: 90 tablet; Refill: 1  Encounter for general adult medical examination with abnormal findings- Exam completed, labs reviewed, vaccines reviewed, no cancer screenings indicated, pt ed material was given.   Essential hypertension -  CBC with  Differential/Platelet; Future -     Irbesartan ; Take 1 tablet (150 mg total) by mouth daily.  Dispense: 90 tablet; Refill: 1  Lesion of skin of face -     Ambulatory referral to Dermatology  DDD (degenerative disc disease), lumbar -     oxyCODONE  HCl; Take 1 tablet (5 mg total) by mouth every 6 (six) hours as needed for severe pain (pain score 7-10).  Dispense: 100 tablet; Refill: 0  Spinal stenosis, lumbar region, with neurogenic claudication -     oxyCODONE  HCl; Take 1 tablet (5 mg total) by mouth every 6 (six) hours as needed for severe pain (pain score 7-10).  Dispense: 100 tablet; Refill: 0  Primary osteoarthritis of both knees -     oxyCODONE  HCl; Take 1 tablet (5 mg total) by mouth every 6 (six) hours as needed for severe pain (pain score 7-10).  Dispense: 100 tablet; Refill: 0  Diastolic dysfunction with chronic heart failure (HCC) -     Empagliflozin ; Take 1 tablet (10 mg total) by mouth daily.  Dispense: 90 tablet; Refill: 1  Stage 3b chronic kidney disease (HCC) -     Empagliflozin ; Take 1 tablet (10 mg total) by mouth daily.  Dispense: 90 tablet; Refill: 1  Recurrent major depressive disorder, in partial remission (HCC) -     buPROPion  HCl ER (XL); Take 3 tablets (450 mg total) by mouth daily.  Dispense: 270 tablet; Refill: 1 -     Vilazodone  HCl; Take 1 tablet (40 mg total) by mouth daily.  Dispense: 90 tablet; Refill: 1     Follow-up: Return in about 6 months (around 08/20/2024).  Sandra Crouch, MD

## 2024-02-21 ENCOUNTER — Telehealth: Payer: Self-pay | Admitting: Internal Medicine

## 2024-02-21 NOTE — Telephone Encounter (Signed)
 Patient needs a letter from Dr. Rochelle Chu stating that her trash cans need to be brought to the street by the garbage people.  Copy of note explaining this placed in Dr. Rochelle Chu box.  Please call patient when this is ready.

## 2024-02-23 DIAGNOSIS — N1832 Chronic kidney disease, stage 3b: Secondary | ICD-10-CM | POA: Insufficient documentation

## 2024-02-23 DIAGNOSIS — F3341 Major depressive disorder, recurrent, in partial remission: Secondary | ICD-10-CM | POA: Insufficient documentation

## 2024-02-26 ENCOUNTER — Ambulatory Visit: Payer: Self-pay | Admitting: Internal Medicine

## 2024-02-27 DIAGNOSIS — M25512 Pain in left shoulder: Secondary | ICD-10-CM | POA: Diagnosis not present

## 2024-03-03 DIAGNOSIS — M25512 Pain in left shoulder: Secondary | ICD-10-CM | POA: Diagnosis not present

## 2024-03-04 DIAGNOSIS — M5416 Radiculopathy, lumbar region: Secondary | ICD-10-CM | POA: Diagnosis not present

## 2024-03-04 DIAGNOSIS — M48062 Spinal stenosis, lumbar region with neurogenic claudication: Secondary | ICD-10-CM | POA: Diagnosis not present

## 2024-03-05 DIAGNOSIS — M25512 Pain in left shoulder: Secondary | ICD-10-CM | POA: Diagnosis not present

## 2024-03-06 ENCOUNTER — Telehealth: Payer: Self-pay | Admitting: Internal Medicine

## 2024-03-06 NOTE — Telephone Encounter (Unsigned)
 Copied from CRM [id number]. Topic: Clinical - Medication Question >> Mar 06, 2024 11:27 AM Pam Bode wrote: Reason for CRM: Patient is calling to see if her letter is ready from Dr. Rochelle Chu stating she trash cans need to be brought to the street by the garage people. She needs note explaining this, Please contact patient and follow up.

## 2024-03-09 ENCOUNTER — Encounter: Payer: Self-pay | Admitting: Dermatology

## 2024-03-09 ENCOUNTER — Ambulatory Visit: Admitting: Dermatology

## 2024-03-09 VITALS — BP 122/78

## 2024-03-09 DIAGNOSIS — L821 Other seborrheic keratosis: Secondary | ICD-10-CM | POA: Diagnosis not present

## 2024-03-09 DIAGNOSIS — C44319 Basal cell carcinoma of skin of other parts of face: Secondary | ICD-10-CM | POA: Diagnosis not present

## 2024-03-09 DIAGNOSIS — D492 Neoplasm of unspecified behavior of bone, soft tissue, and skin: Secondary | ICD-10-CM | POA: Diagnosis not present

## 2024-03-09 DIAGNOSIS — Z808 Family history of malignant neoplasm of other organs or systems: Secondary | ICD-10-CM

## 2024-03-09 DIAGNOSIS — D485 Neoplasm of uncertain behavior of skin: Secondary | ICD-10-CM
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
## 2024-03-09 NOTE — Patient Instructions (Addendum)
 Date: Mon Mar 09 2024  Hello Leslie Edwards,  Thank you for visiting today. Here is a summary of the key instructions:  - Wound Care:   - Keep the biopsy site covered with Aquaphor or Vaseline daily   - If staying at home, use Aquaphor only   - If going out, use Aquaphor and a Band-Aid  - Follow-up:   - Biopsy results Leslie Edwards be available in about a week   - Schedule a full head-to-toe skin cancer screening   - You can wait until the end of summer or after receiving biopsy results  - Procedure Information:   - A skin biopsy was performed on a 6 cm pink pearly papule on the left cheek   - If results are benign, the area Leslie Edwards heal on its own   - If results show skin cancer, you Leslie Edwards be called and referred to Dr. Paci for surgery  Please reach out if you have any questions or concerns.  Warm regards,  Dr. Delon Lenis Dermatology Patient Handout: Wound Care for Skin Biopsy Site  Taking Care of Your Skin Biopsy Site  Proper care of the biopsy site is essential for promoting healing and minimizing scarring. This handout provides instructions on how to care for your biopsy site to ensure optimal recovery.  1. Cleaning the Wound:  Clean the biopsy site daily with gentle soap and water. Gently pat the area dry with a clean, soft towel. Avoid harsh scrubbing or rubbing the area, as this can irritate the skin and delay healing.  2. Applying Aquaphor and Bandage:  After cleaning the wound, apply a thin layer of Aquaphor ointment to the biopsy site. Cover the area with a sterile bandage to protect it from dirt, bacteria, and friction. Change the bandage daily or as needed if it becomes soiled or wet.  3. Continued Care for One Week:  Repeat the cleaning, Aquaphor application, and bandaging process daily for one week following the biopsy procedure. Keeping the wound clean and moist during this initial healing period Leslie Edwards help prevent infection and promote optimal healing.  4. Massaging  Aquaphor into the Area:  ---After one week, discontinue the use of bandages but continue to apply Aquaphor to the biopsy site. ----Gently massage the Aquaphor into the area using circular motions. ---Massaging the skin helps to promote circulation and prevent the formation of scar tissue.   Additional Tips:  Avoid exposing the biopsy site to direct sunlight during the healing process, as this can cause hyperpigmentation or worsen scarring. If you experience any signs of infection, such as increased redness, swelling, warmth, or drainage from the wound, contact your healthcare provider immediately. Follow any additional instructions provided by your healthcare provider for caring for the biopsy site and managing any discomfort. Conclusion:  Taking proper care of your skin biopsy site is crucial for ensuring optimal healing and minimizing scarring. By following these instructions for cleaning, applying Aquaphor, and massaging the area, you can promote a smooth and successful recovery. If you have any questions or concerns about caring for your biopsy site, don't hesitate to contact your healthcare provider for guidance.     Important Information  Due to recent changes in healthcare laws, you may see results of your pathology and/or laboratory studies on MyChart before the doctors have had a chance to review them. We understand that in some cases there may be results that are confusing or concerning to you. Please understand that not all results are received at the same  time and often the doctors may need to interpret multiple results in order to provide you with the best plan of care or course of treatment. Therefore, we ask that you please give us  2 business days to thoroughly review all your results before contacting the office for clarification. Should we see a critical lab result, you Leslie Edwards be contacted sooner.   If You Need Anything After Your Visit  If you have any questions or concerns  for your doctor, please call our main line at [phone] If no one answers, please leave a voicemail as directed and we Leslie Edwards return your call as soon as possible. Messages left after 4 pm Leslie Edwards be answered the following business day.   You may also send us  a message via MyChart. We typically respond to MyChart messages within 1-2 business days.  For prescription refills, please ask your pharmacy to contact our office. Our fax number is [phone].  If you have an urgent issue when the clinic is closed that cannot wait until the next business day, you can page your doctor at the number below.    Please note that while we do our best to be available for urgent issues outside of office hours, we are not available 24/7.   If you have an urgent issue and are unable to reach us , you may choose to seek medical care at your doctor's office, retail clinic, urgent care center, or emergency room.  If you have a medical emergency, please immediately call 911 or go to the emergency department. In the event of inclement weather, please call our main line at [phone] for an update on the status of any delays or closures.  Dermatology Medication Tips: Please keep the boxes that topical medications come in in order to help keep track of the instructions about where and how to use these. Pharmacies typically print the medication instructions only on the boxes and not directly on the medication tubes.   If your medication is too expensive, please contact our office at [phone] or send us  a message through MyChart.   We are unable to tell what your co-pay for medications Leslie Edwards be in advance as this is different depending on your insurance coverage. However, we may be able to find a substitute medication at lower cost or fill out paperwork to get insurance to cover a needed medication.   If a prior authorization is required to get your medication covered by your insurance company, please allow us  1-2  business days to complete this process.  Drug prices often vary depending on where the prescription is filled and some pharmacies may offer cheaper prices.  The website www.goodrx.com contains coupons for medications through different pharmacies. The prices here do not account for what the cost may be with help from insurance (it may be cheaper with your insurance), but the website can give you the price if you did not use any insurance.  - You can print the associated coupon and take it with your prescription to the pharmacy.  - You may also stop by our office during regular business hours and pick up a GoodRx coupon card.  - If you need your prescription sent electronically to a different pharmacy, notify our office through [facility] or by phone at [phone]

## 2024-03-10 DIAGNOSIS — M25512 Pain in left shoulder: Secondary | ICD-10-CM | POA: Diagnosis not present

## 2024-03-10 LAB — SURGICAL PATHOLOGY

## 2024-03-11 ENCOUNTER — Ambulatory Visit: Payer: Self-pay | Admitting: Dermatology

## 2024-03-11 DIAGNOSIS — C4491 Basal cell carcinoma of skin, unspecified: Secondary | ICD-10-CM

## 2024-03-11 NOTE — Progress Notes (Signed)
 Hi Shirron,  Please call pt and notify their bx results were positive for a skin CA that will be treated with Mohs by Dr. Paci

## 2024-03-12 ENCOUNTER — Encounter: Payer: Self-pay | Admitting: Dermatology

## 2024-03-12 DIAGNOSIS — C4491 Basal cell carcinoma of skin, unspecified: Secondary | ICD-10-CM | POA: Insufficient documentation

## 2024-03-13 ENCOUNTER — Other Ambulatory Visit: Payer: Self-pay | Admitting: Internal Medicine

## 2024-03-13 DIAGNOSIS — E781 Pure hyperglyceridemia: Secondary | ICD-10-CM

## 2024-03-13 DIAGNOSIS — M25512 Pain in left shoulder: Secondary | ICD-10-CM | POA: Diagnosis not present

## 2024-03-13 NOTE — Telephone Encounter (Signed)
 Spoke with patient and advised her that Dr Joshua filled this form out and we faxed it back as directed on the sheet. She gave a verbal understanding and stated that she would call us  back if she needed anything else.

## 2024-03-17 DIAGNOSIS — M25512 Pain in left shoulder: Secondary | ICD-10-CM | POA: Diagnosis not present

## 2024-03-19 DIAGNOSIS — M25512 Pain in left shoulder: Secondary | ICD-10-CM | POA: Diagnosis not present

## 2024-03-24 DIAGNOSIS — M25512 Pain in left shoulder: Secondary | ICD-10-CM | POA: Diagnosis not present

## 2024-03-26 DIAGNOSIS — M25512 Pain in left shoulder: Secondary | ICD-10-CM | POA: Diagnosis not present

## 2024-04-02 DIAGNOSIS — M25512 Pain in left shoulder: Secondary | ICD-10-CM | POA: Diagnosis not present

## 2024-04-06 ENCOUNTER — Encounter: Payer: Self-pay | Admitting: Dermatology

## 2024-04-07 DIAGNOSIS — M25512 Pain in left shoulder: Secondary | ICD-10-CM | POA: Diagnosis not present

## 2024-04-08 ENCOUNTER — Encounter: Admitting: Dermatology

## 2024-04-08 DIAGNOSIS — M5416 Radiculopathy, lumbar region: Secondary | ICD-10-CM | POA: Diagnosis not present

## 2024-04-09 ENCOUNTER — Encounter: Payer: Self-pay | Admitting: Dermatology

## 2024-04-09 ENCOUNTER — Ambulatory Visit: Admitting: Dermatology

## 2024-04-09 VITALS — BP 130/70 | HR 74

## 2024-04-09 DIAGNOSIS — C4491 Basal cell carcinoma of skin, unspecified: Secondary | ICD-10-CM

## 2024-04-09 DIAGNOSIS — L579 Skin changes due to chronic exposure to nonionizing radiation, unspecified: Secondary | ICD-10-CM | POA: Diagnosis not present

## 2024-04-09 DIAGNOSIS — C44319 Basal cell carcinoma of skin of other parts of face: Secondary | ICD-10-CM

## 2024-04-09 DIAGNOSIS — L814 Other melanin hyperpigmentation: Secondary | ICD-10-CM

## 2024-04-09 MED ORDER — OXYCODONE HCL 5 MG PO TABS: 5.0000 mg | ORAL_TABLET | Freq: Four times a day (QID) | ORAL | 0 refills | Status: DC | PRN

## 2024-04-09 NOTE — Patient Instructions (Signed)

## 2024-04-09 NOTE — Progress Notes (Signed)
 Follow-Up Visit   Subjective  Leslie Edwards is a 78 y.o. female who presents for the following: Mohs of a Nodular Basal Cell Carcinoma of the left malar cheek, referred by Dr. Alm.   The following portions of the chart were reviewed this encounter and updated as appropriate: medications, allergies, medical history  Review of Systems:  No other skin or systemic complaints except as noted in HPI or Assessment and Plan.  Objective  Well appearing patient in no apparent distress; mood and affect are within normal limits.  A focused examination was performed of the following areas: Left malar cheek Relevant physical exam findings are noted in the Assessment and Plan.   Left Malar Cheek Healing biopsy site   Assessment & Plan   BASAL CELL CARCINOMA (BCC), UNSPECIFIED SITE Left Malar Cheek Mohs surgery  Consent obtained: written  Anticoagulation: Was the anticoagulation regimen changed prior to Mohs? No    Anesthesia: Anesthesia method: local infiltration Local anesthetic: lidocaine  1% WITH epi  Procedure Details: Timeout: pre-procedure verification complete Procedure Prep: patient was prepped and draped in usual sterile fashion Prep type: chlorhexidine Pre-Op diagnosis: basal cell carcinoma BCC subtype: nodular MohsAIQ Surgical site (if tumor spans multiple areas, please select predominant area): cheek (including jawline) Surgery side: left Surgical site (from skin exam): Left Malar Cheek Pre-operative length (cm): 0.5 Pre-operative width (cm): 0.4 Indications for Mohs surgery: anatomic location where tissue conservation is critical  Micrographic Surgery Details: Post-operative length (cm): 2 Post-operative width (cm): 1.1 Number of Mohs stages: 2 Cumulative additional sections past 5 per stage: 0 Post surgery depth of defect: dermis and subcutaneous fat  Stage 1    Tumor features identified on Mohs section: basal carcinoma    Depth of tumor invasion after  stage: dermis  Stage 2    Tumor features identified on Mohs section: no tumor identified  Skin repair Complexity:  Complex Final length (cm):  3.9 Informed consent: discussed and consent obtained   Timeout: patient name, date of birth, surgical site, and procedure verified   Procedure prep:  Patient was prepped and draped in usual sterile fashion Prep type:  Chlorhexidine Anesthesia: the lesion was anesthetized in a standard fashion   Anesthetic:  1% lidocaine  w/ epinephrine  1-100,000 buffered w/ 8.4% NaHCO3 Reason for type of repair: reduce tension to allow closure, preserve normal anatomy, preserve normal anatomical and functional relationships, avoid adjacent structures and allow side-to-side closure without requiring a flap or graft   Undermining: area extensively undermined   Subcutaneous layers (deep stitches):  Suture size:  5-0 Suture type: Monocryl (poliglecaprone 25)   Stitches:  Buried vertical mattress Fine/surface layer approximation (top stitches):  Suture size:  6-0 Suture type: fast-absorbing plain gut   Stitches: simple running   Hemostasis achieved with: suture, pressure and electrodesiccation Outcome: patient tolerated procedure well with no complications   Post-procedure details: sterile dressing applied and wound care instructions given   Dressing type: bandage and petrolatum    Related Medications oxyCODONE  (OXY IR/ROXICODONE ) 5 MG immediate release tablet Take 1 tablet (5 mg total) by mouth every 6 (six) hours as needed for up to 8 doses.   Return in about 4 weeks (around 05/07/2024) for wound check.  LILLETTE Darice Smock, CMA, am acting as scribe for RUFUS CHRISTELLA HOLY, MD.   04/09/2024  HISTORY OF PRESENT ILLNESS  Leslie Edwards is seen in consultation at the request of Dr. Alm for biopsy-proven Nodular Basal Cell Carcinoma of the left malar cheek. They note  that the area has been present for about 6 months increasing in size with time.  There is no history  of previous treatment.  Reports no other new or changing lesions and has no other complaints today.  Medications and allergies: see patient chart.  Review of systems: Reviewed 8 systems and notable for the above skin cancer.  All other systems reviewed are unremarkable/negative, unless noted in the HPI. Past medical history, surgical history, family history, social history were also reviewed and are noted in the chart/questionnaire.    PHYSICAL EXAMINATION  General: Well-appearing, in no acute distress, alert and oriented x 4. Vitals reviewed in chart (if available).   Skin: Exam reveals a 0.5 x0.4 cm erythematous papule and biopsy scar on the left malar cheek. There are rhytids, telangiectasias, and lentigines, consistent with photodamage.   Biopsy report(s) reviewed, confirming the diagnosis.   ASSESSMENT  1) Nodular Basal Cell Carcinoma of the left malar cheek 2) photodamage 3) solar lentigines   PLAN   1. Due to location, size, histology, or recurrence and the likelihood of subclinical extension as well as the need to conserve normal surrounding tissue, the patient was deemed acceptable for Mohs micrographic surgery (MMS).  The nature and purpose of the procedure, associated benefits and risks including recurrence and scarring, possible complications such as pain, infection, and bleeding, and alternative methods of treatment if appropriate were discussed with the patient during consent. The lesion location was verified by the patient, by reviewing previous notes, pathology reports, and by photographs as well as angulation measurements if available.  Informed consent was reviewed and signed by the patient, and timeout was performed at 9:30 AM. See op note below.  2. For the photodamage and solar lentigines, sun protection discussed/information given on OTC sunscreens, and we recommend continued regular follow-up with primary dermatologist every 6 months or sooner for any growing, bleeding,  or changing lesions. 3. Prognosis and future surveillance discussed. 4. Letter with treatment outcome sent to referring provider. 5. Pain acetaminophen /ibuprofen/oxycodone  5 mg  MOHS MICROGRAPHIC SURGERY AND RECONSTRUCTION  Initial size:   0.5 x 0.4 cm Surgical defect/wound size: 2.0 x 1.1 cm Anesthesia:    0.33% lidocaine  with 1:200,000 epinephrine  EBL:    <5 mL Complications:  None Repair type:   Complex SQ suture:   5-0 Monocryl Cutaneous suture:  6-0 Plain gut Final size of the repair: 3.9 cm  Stages: 2  STAGE I: Anesthesia achieved with 0.5% lidocaine  with 1:200,000 epinephrine . ChloraPrep applied. 1 section(s) excised using Mohs technique (this includes total peripheral and deep tissue margin excision and evaluation with frozen sections, excised and interpreted by the same physician). The tumor was first debulked and then excised with an approx. 2 mm margin.  Hemostasis was achieved with electrocautery as needed.  The specimen was then oriented, subdivided/relaxed, inked, and processed using Mohs technique.    Frozen section analysis revealed a positive margin for thin cords or strands of basaloid tumor cells that deeply infiltrate the surrounding stroma, often with irregular, angulated edges, appearing as a permeating invasion pattern at the tumor margins; these strands are embedded within a dense, collagenous stroma, with less prominent peripheral palisading and retraction in the deep and peripheral margin.    STAGE II: An additional 2 mm margin was excised.  Hemostasis was achieved with electrocautery as needed.  The specimen was then oriented, subdivided/relaxed, inked, and processed using Mohs technique. Evaluation of slides by the Mohs surgeon revealed clear tumor margins.   Reconstruction  The surgical  wound was then cleaned, prepped, and re-anesthetized as above. Wound edges were undermined extensively along at least one entire edge and at a distance equal to or greater than  the width of the defect (see wound defect size above) in order to achieve closure and decrease wound tension and anatomic distortion. Redundant tissue repair including standing cone removal was performed. Hemostasis was achieved with electrocautery. Subcutaneous and epidermal tissues were approximated with the above sutures. The surgical site was then lightly scrubbed with sterile, saline-soaked gauze. The area was then bandaged using Vaseline ointment, non-adherent gauze, gauze pads, and tape to provide an adequate pressure dressing. The patient tolerated the procedure well, was given detailed written and verbal wound care instructions, and was discharged in good condition.   The patient will follow-up: 4 weeks.   Documentation: I have reviewed the above documentation for accuracy and completeness, and I agree with the above.  RUFUS CHRISTELLA HOLY, MD

## 2024-04-16 ENCOUNTER — Encounter: Payer: Self-pay | Admitting: Dermatology

## 2024-04-21 DIAGNOSIS — M25512 Pain in left shoulder: Secondary | ICD-10-CM | POA: Diagnosis not present

## 2024-04-23 DIAGNOSIS — M25512 Pain in left shoulder: Secondary | ICD-10-CM | POA: Diagnosis not present

## 2024-04-27 DIAGNOSIS — M25512 Pain in left shoulder: Secondary | ICD-10-CM | POA: Diagnosis not present

## 2024-04-29 ENCOUNTER — Encounter: Payer: Self-pay | Admitting: Internal Medicine

## 2024-04-30 ENCOUNTER — Other Ambulatory Visit: Payer: Self-pay | Admitting: Internal Medicine

## 2024-04-30 DIAGNOSIS — M170 Bilateral primary osteoarthritis of knee: Secondary | ICD-10-CM

## 2024-04-30 DIAGNOSIS — M51369 Other intervertebral disc degeneration, lumbar region without mention of lumbar back pain or lower extremity pain: Secondary | ICD-10-CM

## 2024-04-30 DIAGNOSIS — M48062 Spinal stenosis, lumbar region with neurogenic claudication: Secondary | ICD-10-CM

## 2024-04-30 MED ORDER — OXYCODONE HCL 5 MG PO TABS: 5.0000 mg | ORAL_TABLET | Freq: Four times a day (QID) | ORAL | 0 refills | Status: DC | PRN

## 2024-05-05 DIAGNOSIS — Z1330 Encounter for screening examination for mental health and behavioral disorders, unspecified: Secondary | ICD-10-CM | POA: Diagnosis not present

## 2024-05-05 DIAGNOSIS — M5416 Radiculopathy, lumbar region: Secondary | ICD-10-CM | POA: Diagnosis not present

## 2024-05-05 DIAGNOSIS — M48062 Spinal stenosis, lumbar region with neurogenic claudication: Secondary | ICD-10-CM | POA: Diagnosis not present

## 2024-05-05 DIAGNOSIS — M25512 Pain in left shoulder: Secondary | ICD-10-CM | POA: Diagnosis not present

## 2024-05-06 ENCOUNTER — Encounter: Payer: Self-pay | Admitting: Dermatology

## 2024-05-06 ENCOUNTER — Ambulatory Visit: Admitting: Dermatology

## 2024-05-06 DIAGNOSIS — C4491 Basal cell carcinoma of skin, unspecified: Secondary | ICD-10-CM

## 2024-05-06 DIAGNOSIS — L905 Scar conditions and fibrosis of skin: Secondary | ICD-10-CM

## 2024-05-06 DIAGNOSIS — S01402D Unspecified open wound of left cheek and temporomandibular area, subsequent encounter: Secondary | ICD-10-CM

## 2024-05-06 DIAGNOSIS — Z85828 Personal history of other malignant neoplasm of skin: Secondary | ICD-10-CM

## 2024-05-06 NOTE — Progress Notes (Signed)
   Follow Up Visit   Subjective  Leslie Edwards is a 78 y.o. female who presents for the following: follow up from Mohs surgery   The patient presents for follow up from Mohs surgery for a BCC  on the left malar cheek, treated on 04/09/24, repaired with linear closure. The patient has been bandaging the wound as directed. The endorse the following concerns: significant bleeding first night. She is unhappy with appearance.   The following portions of the chart were reviewed this encounter and updated as appropriate: medications, allergies, medical history  Review of Systems:  No other skin or systemic complaints except as noted in HPI or Assessment and Plan.  Objective  Well appearing patient in no apparent distress; mood and affect are within normal limits.  A focal examination was performed including scalp, head, face . All findings within normal limits unless otherwise noted below.  Healing wound with mild erythema  Relevant physical exam findings are noted in the Assessment and Plan.    Assessment & Plan   Healing Wound s/p Mohs for [facility] on the left malar cheek, treated on 04/09/24, repaired with linear closure/- Reassured that wound is healing well - No evidence of infection - No swelling, induration, purulence, dehiscence, or tenderness out of proportion to the clinical exam, see photo above - Discussed that scars take up to 12 months to mature from the date of surgery - Recommend SPF 30+ to scar daily to prevent purple color from UV exposure during scar maturation process - Discussed that erythema and raised appearance of scar will fade over the next 4-6 months - OK to start scar massage at 4-6 weeks post-op - Can consider silicone based products for scar healing starting at 6 weeks post-op  HISTORY OF BASAL CELL CARCINOMA OF THE SKIN - No evidence of recurrence today - Recommend regular full body skin exams - Recommend daily broad spectrum sunscreen SPF 30+ to sun-exposed  areas, reapply every 2 hours as needed.  - Call if any new or changing lesions are noted between office visits   Return in about 4 months (around 09/05/2024) for wound check.  I, Darice Smock, CMA, am acting as scribe for RUFUS CHRISTELLA HOLY, MD.   Documentation: I have reviewed the above documentation for accuracy and completeness, and I agree with the above.  RUFUS CHRISTELLA HOLY, MD

## 2024-05-06 NOTE — Patient Instructions (Signed)

## 2024-05-07 ENCOUNTER — Ambulatory Visit: Admitting: Dermatology

## 2024-05-08 DIAGNOSIS — M25512 Pain in left shoulder: Secondary | ICD-10-CM | POA: Diagnosis not present

## 2024-05-12 DIAGNOSIS — M25512 Pain in left shoulder: Secondary | ICD-10-CM | POA: Diagnosis not present

## 2024-05-14 DIAGNOSIS — M25512 Pain in left shoulder: Secondary | ICD-10-CM | POA: Diagnosis not present

## 2024-05-25 DIAGNOSIS — M25512 Pain in left shoulder: Secondary | ICD-10-CM | POA: Diagnosis not present

## 2024-05-26 DIAGNOSIS — M5416 Radiculopathy, lumbar region: Secondary | ICD-10-CM | POA: Diagnosis not present

## 2024-05-29 DIAGNOSIS — M25512 Pain in left shoulder: Secondary | ICD-10-CM | POA: Diagnosis not present

## 2024-06-01 ENCOUNTER — Telehealth: Payer: Self-pay | Admitting: Radiology

## 2024-06-01 ENCOUNTER — Other Ambulatory Visit: Payer: Self-pay

## 2024-06-01 DIAGNOSIS — N1832 Chronic kidney disease, stage 3b: Secondary | ICD-10-CM

## 2024-06-01 DIAGNOSIS — I5032 Chronic diastolic (congestive) heart failure: Secondary | ICD-10-CM

## 2024-06-01 MED ORDER — EMPAGLIFLOZIN 10 MG PO TABS: 10.0000 mg | ORAL_TABLET | Freq: Every day | ORAL | 1 refills | Status: DC

## 2024-06-01 NOTE — Telephone Encounter (Signed)
 Medication has been sent. I have touched bases with heather.

## 2024-06-01 NOTE — Telephone Encounter (Signed)
 Copied from CRM #[id number]. Topic: Clinical - Medication Question >> Jun 01, 2024 12:17 PM Harlene ORN wrote: Reason for CRM: [facility] requesting a 90 day supply of Jaurdiance to her [employer] order instead. Phone: [phone]

## 2024-06-02 DIAGNOSIS — M25512 Pain in left shoulder: Secondary | ICD-10-CM | POA: Diagnosis not present

## 2024-06-09 DIAGNOSIS — M25512 Pain in left shoulder: Secondary | ICD-10-CM | POA: Diagnosis not present

## 2024-06-11 DIAGNOSIS — M25512 Pain in left shoulder: Secondary | ICD-10-CM | POA: Diagnosis not present

## 2024-06-24 DIAGNOSIS — M48062 Spinal stenosis, lumbar region with neurogenic claudication: Secondary | ICD-10-CM | POA: Diagnosis not present

## 2024-06-24 DIAGNOSIS — M25512 Pain in left shoulder: Secondary | ICD-10-CM | POA: Diagnosis not present

## 2024-06-27 ENCOUNTER — Other Ambulatory Visit: Payer: Self-pay | Admitting: Internal Medicine

## 2024-06-27 DIAGNOSIS — I10 Essential (primary) hypertension: Secondary | ICD-10-CM

## 2024-07-06 DIAGNOSIS — M545 Low back pain, unspecified: Secondary | ICD-10-CM | POA: Diagnosis not present

## 2024-07-06 DIAGNOSIS — M48062 Spinal stenosis, lumbar region with neurogenic claudication: Secondary | ICD-10-CM | POA: Diagnosis not present

## 2024-07-06 DIAGNOSIS — M25512 Pain in left shoulder: Secondary | ICD-10-CM | POA: Diagnosis not present

## 2024-07-07 DIAGNOSIS — K080 Exfoliation of teeth due to systemic causes: Secondary | ICD-10-CM | POA: Diagnosis not present

## 2024-07-08 DIAGNOSIS — M25512 Pain in left shoulder: Secondary | ICD-10-CM | POA: Diagnosis not present

## 2024-07-08 DIAGNOSIS — M545 Low back pain, unspecified: Secondary | ICD-10-CM | POA: Diagnosis not present

## 2024-07-08 DIAGNOSIS — M48062 Spinal stenosis, lumbar region with neurogenic claudication: Secondary | ICD-10-CM | POA: Diagnosis not present

## 2024-07-09 ENCOUNTER — Encounter: Payer: Self-pay | Admitting: Internal Medicine

## 2024-07-09 ENCOUNTER — Other Ambulatory Visit: Payer: Self-pay

## 2024-07-09 DIAGNOSIS — M170 Bilateral primary osteoarthritis of knee: Secondary | ICD-10-CM

## 2024-07-09 DIAGNOSIS — M48062 Spinal stenosis, lumbar region with neurogenic claudication: Secondary | ICD-10-CM

## 2024-07-09 DIAGNOSIS — M51369 Other intervertebral disc degeneration, lumbar region without mention of lumbar back pain or lower extremity pain: Secondary | ICD-10-CM

## 2024-07-10 MED ORDER — OXYCODONE HCL 5 MG PO TABS: 5.0000 mg | ORAL_TABLET | Freq: Four times a day (QID) | ORAL | 0 refills | Status: DC | PRN

## 2024-07-15 DIAGNOSIS — M48062 Spinal stenosis, lumbar region with neurogenic claudication: Secondary | ICD-10-CM | POA: Diagnosis not present

## 2024-07-15 DIAGNOSIS — M545 Low back pain, unspecified: Secondary | ICD-10-CM | POA: Diagnosis not present

## 2024-07-15 DIAGNOSIS — M25512 Pain in left shoulder: Secondary | ICD-10-CM | POA: Diagnosis not present

## 2024-07-17 DIAGNOSIS — M25512 Pain in left shoulder: Secondary | ICD-10-CM | POA: Diagnosis not present

## 2024-07-17 DIAGNOSIS — M48062 Spinal stenosis, lumbar region with neurogenic claudication: Secondary | ICD-10-CM | POA: Diagnosis not present

## 2024-07-22 DIAGNOSIS — M48062 Spinal stenosis, lumbar region with neurogenic claudication: Secondary | ICD-10-CM | POA: Diagnosis not present

## 2024-07-22 DIAGNOSIS — M25512 Pain in left shoulder: Secondary | ICD-10-CM | POA: Diagnosis not present

## 2024-07-28 DIAGNOSIS — M25512 Pain in left shoulder: Secondary | ICD-10-CM | POA: Diagnosis not present

## 2024-07-28 DIAGNOSIS — M48062 Spinal stenosis, lumbar region with neurogenic claudication: Secondary | ICD-10-CM | POA: Diagnosis not present

## 2024-07-30 DIAGNOSIS — M48062 Spinal stenosis, lumbar region with neurogenic claudication: Secondary | ICD-10-CM | POA: Diagnosis not present

## 2024-07-30 DIAGNOSIS — M25512 Pain in left shoulder: Secondary | ICD-10-CM | POA: Diagnosis not present

## 2024-08-06 DIAGNOSIS — M25512 Pain in left shoulder: Secondary | ICD-10-CM | POA: Diagnosis not present

## 2024-08-06 DIAGNOSIS — M48062 Spinal stenosis, lumbar region with neurogenic claudication: Secondary | ICD-10-CM | POA: Diagnosis not present

## 2024-08-10 ENCOUNTER — Other Ambulatory Visit: Payer: Self-pay | Admitting: Internal Medicine

## 2024-08-10 DIAGNOSIS — I5032 Chronic diastolic (congestive) heart failure: Secondary | ICD-10-CM

## 2024-08-10 DIAGNOSIS — M48062 Spinal stenosis, lumbar region with neurogenic claudication: Secondary | ICD-10-CM | POA: Diagnosis not present

## 2024-08-10 DIAGNOSIS — N1832 Chronic kidney disease, stage 3b: Secondary | ICD-10-CM

## 2024-08-10 DIAGNOSIS — M25512 Pain in left shoulder: Secondary | ICD-10-CM | POA: Diagnosis not present

## 2024-08-11 ENCOUNTER — Encounter: Payer: Self-pay | Admitting: Internal Medicine

## 2024-08-12 ENCOUNTER — Other Ambulatory Visit: Payer: Self-pay

## 2024-08-12 DIAGNOSIS — N1832 Chronic kidney disease, stage 3b: Secondary | ICD-10-CM

## 2024-08-12 DIAGNOSIS — I5032 Chronic diastolic (congestive) heart failure: Secondary | ICD-10-CM

## 2024-08-12 MED ORDER — EMPAGLIFLOZIN 10 MG PO TABS: 10.0000 mg | ORAL_TABLET | Freq: Every day | ORAL | 1 refills | Status: AC

## 2024-08-18 DIAGNOSIS — M48062 Spinal stenosis, lumbar region with neurogenic claudication: Secondary | ICD-10-CM | POA: Diagnosis not present

## 2024-08-18 DIAGNOSIS — M25512 Pain in left shoulder: Secondary | ICD-10-CM | POA: Diagnosis not present

## 2024-08-24 DIAGNOSIS — M48062 Spinal stenosis, lumbar region with neurogenic claudication: Secondary | ICD-10-CM | POA: Diagnosis not present

## 2024-08-24 DIAGNOSIS — M479 Spondylosis, unspecified: Secondary | ICD-10-CM | POA: Diagnosis not present

## 2024-08-26 ENCOUNTER — Ambulatory Visit: Payer: Self-pay | Admitting: Internal Medicine

## 2024-08-26 ENCOUNTER — Encounter: Payer: Self-pay | Admitting: Internal Medicine

## 2024-08-26 ENCOUNTER — Ambulatory Visit (INDEPENDENT_AMBULATORY_CARE_PROVIDER_SITE_OTHER)

## 2024-08-26 ENCOUNTER — Ambulatory Visit: Admitting: Internal Medicine

## 2024-08-26 VITALS — BP 136/82 | HR 68 | Temp 97.9°F | Ht 63.25 in | Wt 193.0 lb

## 2024-08-26 DIAGNOSIS — N1832 Chronic kidney disease, stage 3b: Secondary | ICD-10-CM

## 2024-08-26 DIAGNOSIS — M1A39X0 Chronic gout due to renal impairment, multiple sites, without tophus (tophi): Secondary | ICD-10-CM

## 2024-08-26 DIAGNOSIS — M25512 Pain in left shoulder: Secondary | ICD-10-CM | POA: Diagnosis not present

## 2024-08-26 DIAGNOSIS — E785 Hyperlipidemia, unspecified: Secondary | ICD-10-CM

## 2024-08-26 DIAGNOSIS — E038 Other specified hypothyroidism: Secondary | ICD-10-CM | POA: Diagnosis not present

## 2024-08-26 DIAGNOSIS — R1319 Other dysphagia: Secondary | ICD-10-CM

## 2024-08-26 DIAGNOSIS — I10 Essential (primary) hypertension: Secondary | ICD-10-CM

## 2024-08-26 DIAGNOSIS — E213 Hyperparathyroidism, unspecified: Secondary | ICD-10-CM | POA: Diagnosis not present

## 2024-08-26 DIAGNOSIS — M48062 Spinal stenosis, lumbar region with neurogenic claudication: Secondary | ICD-10-CM | POA: Diagnosis not present

## 2024-08-26 DIAGNOSIS — F3341 Major depressive disorder, recurrent, in partial remission: Secondary | ICD-10-CM

## 2024-08-26 DIAGNOSIS — R918 Other nonspecific abnormal finding of lung field: Secondary | ICD-10-CM | POA: Diagnosis not present

## 2024-08-26 LAB — BASIC METABOLIC PANEL WITH GFR
BUN: 36 mg/dL — ABNORMAL HIGH (ref 6–23)
CO2: 29 meq/L (ref 19–32)
Calcium: 9.8 mg/dL (ref 8.4–10.5)
Chloride: 101 meq/L (ref 96–112)
Creatinine, Ser: 1.53 mg/dL — ABNORMAL HIGH (ref 0.40–1.20)
GFR: 32.14 mL/min — ABNORMAL LOW (ref >60.00)
Glucose, Bld: 88 mg/dL (ref 70–99)
Potassium: 4.1 meq/L (ref 3.5–5.1)
Sodium: 139 meq/L (ref 135–145)

## 2024-08-26 LAB — CBC WITH DIFFERENTIAL/PLATELET
Basophils Absolute: 0.1 K/uL (ref 0.0–0.1)
Basophils Relative: 0.9 % (ref 0.0–3.0)
Eosinophils Absolute: 0.3 K/uL (ref 0.0–0.7)
Eosinophils Relative: 4.4 % (ref 0.0–5.0)
HCT: 41.1 % (ref 36.0–46.0)
Hemoglobin: 13.7 g/dL (ref 12.0–15.0)
Lymphocytes Relative: 23 % (ref 12.0–46.0)
Lymphs Abs: 1.5 K/uL (ref 0.7–4.0)
MCHC: 33.4 g/dL (ref 30.0–36.0)
MCV: 99.7 fl (ref 78.0–100.0)
Monocytes Absolute: 0.6 K/uL (ref 0.1–1.0)
Monocytes Relative: 8.8 % (ref 3.0–12.0)
Neutro Abs: 4 K/uL (ref 1.4–7.7)
Neutrophils Relative %: 62.9 % (ref 43.0–77.0)
Platelets: 238 K/uL (ref 150.0–400.0)
RBC: 4.12 Mil/uL (ref 3.87–5.11)
RDW: 14.3 % (ref 11.5–15.5)
WBC: 6.4 K/uL (ref 4.0–10.5)

## 2024-08-26 LAB — URINALYSIS, ROUTINE W REFLEX MICROSCOPIC
Bilirubin Urine: NEGATIVE
Hgb urine dipstick: NEGATIVE
Nitrite: NEGATIVE
Specific Gravity, Urine: 1.025 (ref 1.000–1.030)
Total Protein, Urine: NEGATIVE
Urine Glucose: NEGATIVE
Urobilinogen, UA: 0.2 (ref 0.0–1.0)
pH: 5.5 (ref 5.0–8.0)

## 2024-08-26 LAB — HEPATIC FUNCTION PANEL
ALT: 16 U/L (ref 0–35)
AST: 18 U/L (ref 0–37)
Albumin: 5 g/dL (ref 3.5–5.2)
Alkaline Phosphatase: 80 U/L (ref 39–117)
Bilirubin, Direct: 0.2 mg/dL (ref 0.0–0.3)
Total Bilirubin: 0.7 mg/dL (ref 0.2–1.2)
Total Protein: 7.5 g/dL (ref 6.0–8.3)

## 2024-08-26 LAB — URIC ACID: Uric Acid, Serum: 7.3 mg/dL — ABNORMAL HIGH (ref 2.4–7.0)

## 2024-08-26 NOTE — Progress Notes (Unsigned)
 Subjective:  Patient ID: Leslie Edwards, female    DOB: 10-Jun-1946  Age: 78 y.o. MRN: [id number]  CC: Medical Management of Chronic Issues (6 month follow up )   HPI Leslie Edwards presents for f/up ---  Discussed the use of AI scribe software for clinical note transcription with the patient, who gave verbal consent to proceed.  History of Present Illness Leslie Edwards is a 78 year old female with a history of reflux who presents with episodes of dysphagia and reflux symptoms.  She has been experiencing episodes of dysphagia and reflux symptoms for approximately a week, characterized by a sensation of fullness and pain under the lower sternum, similar to previous reflux episodes. Attempts to eat result in an inability to swallow, leading to projectile vomiting of mucus without blood or food. She has used Pepcid for relief. Currently, she feels okay.  She has a history of reflux and underwent surgery in 2000 to address this issue. She has not seen a gastroenterologist recently, with her last visit being in the early 2000s. Despite these symptoms, she denies any recent weight loss, although she has lost five pounds since June.  She reports experiencing easy bruising since the beginning of the year, with bruises appearing under the skin but not bleeding through. There is a family history of similar symptoms, as her father had 'thin skin.' The last bruise occurred in May and has since healed.  No bleeding from the nose, urine, or stool. No chest pain or shortness of breath during physical activity.   Outpatient Medications Prior to Visit  Medication Sig Dispense Refill   albuterol  (PROVENTIL  HFA;VENTOLIN  HFA) 108 (90 Base) MCG/ACT inhaler Inhale 2 puffs into the lungs every 6 (six) hours as needed. 18 g 11   allopurinol  (ZYLOPRIM ) 100 MG tablet TAKE ONE TABLET BY MOUTH DAILY 90 tablet 1   Ascorbic Acid (VITAMIN C) 1000 MG tablet Take 1,000 mg by mouth daily.     b complex vitamins  tablet Take 1 tablet by mouth daily.     buPROPion  (WELLBUTRIN  XL) 150 MG 24 hr tablet Take 3 tablets (450 mg total) by mouth daily. 270 tablet 1   diazepam (VALIUM) 5 MG tablet Take 5 mg by mouth as needed (When she recieves her injections).     empagliflozin  (JARDIANCE ) 10 MG TABS tablet Take 1 tablet (10 mg total) by mouth daily. 90 tablet 1   famotidine (PEPCID) 10 MG tablet Take 10 mg by mouth 2 (two) times daily as needed for heartburn or indigestion.     ipratropium-albuterol  (DUONEB) 0.5-2.5 (3) MG/3ML SOLN Take 3 mLs by nebulization as needed. 360 mL 1   irbesartan  (AVAPRO ) 150 MG tablet TAKE ONE TABLET BY MOUTH DAILY 90 tablet 1   mometasone  (NASONEX ) 50 MCG/ACT nasal spray Place 2 sprays into the nose as needed. 3 each 1   Multiple Vitamin (MULTIVITAMIN) tablet Take 1 tablet by mouth daily.     omega-3 acid ethyl esters (LOVAZA ) 1 g capsule TAKE TWO CAPSULES BY MOUTH TWICE DAILY 360 capsule 0   oxyCODONE  (OXY IR/ROXICODONE ) 5 MG immediate release tablet Take 1 tablet (5 mg total) by mouth every 6 (six) hours as needed for severe pain (pain score 7-10). 100 tablet 0   pregabalin (LYRICA) 25 MG capsule Take 25 mg by mouth daily. Can take up to 3 tablets daily.     pregabalin (LYRICA) 75 MG capsule Take 75 mg by mouth at bedtime.  liothyronine  (CYTOMEL ) 25 MCG tablet Take 1 tablet (25 mcg total) by mouth daily. 90 tablet 1   pravastatin  (PRAVACHOL ) 20 MG tablet Take 1 tablet (20 mg total) by mouth daily. 90 tablet 1   Vilazodone  HCl (VIIBRYD ) 40 MG TABS Take 1 tablet (40 mg total) by mouth daily. 90 tablet 1   baclofen  (LIORESAL ) 10 MG tablet Take 10 mg by mouth at bedtime.     No facility-administered medications prior to visit.    ROS Review of Systems  Constitutional:  Negative for appetite change, chills, diaphoresis, fatigue and fever.  HENT:  Positive for trouble swallowing. Negative for voice change.   Respiratory: Negative.  Negative for cough, chest tightness, shortness  of breath and wheezing.   Cardiovascular:  Negative for chest pain, palpitations and leg swelling.  Gastrointestinal: Negative.  Negative for abdominal pain, blood in stool, constipation, diarrhea, nausea and vomiting.  Endocrine: Negative.   Genitourinary:  Positive for dysuria. Negative for decreased urine volume, difficulty urinating, flank pain and hematuria.  Musculoskeletal:  Positive for arthralgias, back pain and gait problem. Negative for myalgias and neck pain.  Skin: Negative.  Negative for color change and rash.  Neurological:  Negative for dizziness and weakness.  Hematological:  Negative for adenopathy. Bruises/bleeds easily.  Psychiatric/Behavioral: Negative.      Objective:  BP 136/82 (BP Location: Left Arm, Patient Position: Sitting, Cuff Size: Normal)   Pulse 68   Temp 97.9 F (36.6 C) (Oral)   Ht 5' 3.25 (1.607 m)   Wt 193 lb (87.5 kg)   SpO2 97%   BMI 33.92 kg/m   BP Readings from Last 3 Encounters:  08/26/24 136/82  04/09/24 130/70  03/09/24 122/78    Wt Readings from Last 3 Encounters:  08/26/24 193 lb (87.5 kg)  02/19/24 198 lb 3.2 oz (89.9 kg)  01/30/24 200 lb 12.8 oz (91.1 kg)    Physical Exam Vitals reviewed.  HENT:     Nose: Nose normal.     Mouth/Throat:     Mouth: Mucous membranes are moist.  Eyes:     General: No scleral icterus.    Conjunctiva/sclera: Conjunctivae normal.  Cardiovascular:     Rate and Rhythm: Normal rate and regular rhythm.     Heart sounds: S1 normal and S2 normal. Murmur heard.     Systolic murmur is present with a grade of 1/6.     No diastolic murmur is present.     No friction rub. No gallop.     Comments: EKG- NSR, 65 bpm Antero/lateral infarct pattern is not new No Q waves Unchanged Pulmonary:     Effort: Pulmonary effort is normal.     Breath sounds: No stridor. No wheezing, rhonchi or rales.  Abdominal:     General: Abdomen is flat.     Palpations: There is no mass.     Tenderness: There is no  abdominal tenderness. There is no guarding.     Hernia: No hernia is present.  Musculoskeletal:        General: Normal range of motion.     Cervical back: Neck supple.     Right lower leg: No edema.     Left lower leg: No edema.  Lymphadenopathy:     Cervical: No cervical adenopathy.  Skin:    General: Skin is warm and dry.     Coloration: Skin is not jaundiced or pale.     Findings: Bruising present. No ecchymosis or rash.  Neurological:  General: No focal deficit present.     Mental Status: She is alert.  Psychiatric:        Mood and Affect: Mood normal.        Behavior: Behavior normal.     Lab Results  Component Value Date   WBC 6.4 08/26/2024   HGB 13.7 08/26/2024   HCT 41.1 08/26/2024   PLT 238.0 08/26/2024   GLUCOSE 88 08/26/2024   CHOL 147 02/19/2024   TRIG 78.0 02/19/2024   HDL 78.20 02/19/2024   LDLDIRECT 107.0 10/14/2019   LDLCALC 54 02/19/2024   ALT 16 08/26/2024   AST 18 08/26/2024   NA 139 08/26/2024   K 4.1 08/26/2024   CL 101 08/26/2024   CREATININE 1.53 (H) 08/26/2024   BUN 36 (H) 08/26/2024   CO2 29 08/26/2024   TSH 0.24 (L) 08/26/2024   HGBA1C 5.3 04/28/2009    DG Bone Density Result Date: 11/20/2023 Table formatting from the original result was not included. Date of study: 11/20/2023 Exam: DUAL X-RAY ABSORPTIOMETRY (DXA) FOR BONE MINERAL DENSITY (BMD) Instrument: [employer] Requesting Provider: PCP Indication: follow up for osteoporosis in a patient with history of primary hyperparathyroidism Comparison: 11/03/2015 Clinical data: Pt is a 78 y.o. female without previous history of fracture. On vitamin D . Results:  Lumbar spine L1-L4 (L3) Femoral neck (FN) 33% left distal radius Ultra distal left radius T-score +1.1% RFN: -2.0 LFN: -1.8 -3.4 -3.1 Change in BMD from previous DXA test (%) +13.5%* +2.9% n/a n/a (*) statistically significant Assessment: Patient has osteoporosis according to the [facility] classification for osteoporosis (see below).  Fracture risk: high Comments: the technical quality of the study is good, however, the spine is scoliotic and arthritic. Calcium  accumulation in arthritic sites can confound the results of the bone density scan.  Also, L3 vertebra had to be excluded from analysis due to degenerative changes. Ultradistal radius can be used as a surrogate site for BMD analysis of trabecular bone instead of the spine. Evaluation for secondary causes should be considered if clinically indicated. Recommend optimizing calcium  (1200 mg/day) and vitamin D  (800 IU/day) intake. Followup: Repeat BMD is appropriate after 2 years or after 1-2 years if starting treatment. WHO criteria for diagnosis of osteoporosis in postmenopausal women and in men 50 y/o or older: - normal: T-score -1.0 to + 1.0 - osteopenia/low bone density: T-score between -2.5 and -1.0 - osteoporosis: T-score below -2.5 - severe osteoporosis: T-score below -2.5 with history of fragility fracture Note: although not part of the WHO classification, the presence of a fragility fracture, regardless of the T-score, should be considered diagnostic of osteoporosis, provided other causes for the fracture have been excluded. Treatment: The National Osteoporosis Foundation recommends that treatment be considered in postmenopausal women and men age 30 or older with: 1. Hip or vertebral (clinical or morphometric) fracture 2. T-score of - 2.5 or lower at the spine or hip 3. 10-year fracture probability by FRAX of at least 20% for a major osteoporotic fracture and 3% for a hip fracture Lela Fendt, MD  Endocrinology   DG Chest 2 View Result Date: 08/26/2024 EXAM: 2 VIEW(S) XRAY OF THE CHEST 08/26/2024 11:12:59 AM COMPARISON: 05/30/2022 CLINICAL HISTORY: painful swallowing FINDINGS: LUNGS AND PLEURA: Stable left lingular density is noted most consistent with scarring. Minimal left basilar opacity is noted which may represent subsegmental atelectasis. No pleural effusion. No  pneumothorax. HEART AND MEDIASTINUM: No acute abnormality of the cardiac and mediastinal silhouettes. BONES AND SOFT TISSUES: No acute osseous abnormality.  IMPRESSION: 1. Stable left lingular density, most consistent with scarring. 2. Minimal left basilar opacity, possibly representing subsegmental atelectasis. Electronically signed by: Lynwood Seip MD 08/26/2024 11:49 AM EST RP Workstation: [id number]    Estimated Creatinine Clearance: 31.4 mL/min (A) (by C-G formula based on SCr of 1.53 mg/dL (H)).   Assessment & Plan:  Esophageal dysphagia -     DG Chest 2 View; Future -     Ambulatory referral to Gastroenterology  Essential hypertension- BP is well controlled. -     EKG 12-Lead -     Basic metabolic panel with GFR; Future -     CBC with Differential/Platelet; Future -     Hepatic function panel; Future  Chronic gout due to renal impairment of multiple sites without tophus -     Basic metabolic panel with GFR; Future -     Uric acid; Future  Other specified hypothyroidism- She is euthyroid. -     Thyroid  Panel With TSH; Future -     Liothyronine  Sodium; Take 1 tablet (25 mcg total) by mouth daily.  Dispense: 90 tablet; Refill: 1  Stage 3b chronic kidney disease (HCC) -     Basic metabolic panel with GFR; Future -     Urinalysis, Routine w reflex microscopic; Future  Parathyroid  hormone excess -     Basic metabolic panel with GFR; Future -     PTH, intact and calcium ; Future  Hyperlipidemia with target LDL less than 100- LDL goal achieved. Doing well on the statin  -     Pravastatin  Sodium; Take 1 tablet (20 mg total) by mouth daily.  Dispense: 90 tablet; Refill: 1  Recurrent major depressive disorder, in partial remission -     Vilazodone  HCl; Take 1 tablet (40 mg total) by mouth daily.  Dispense: 90 tablet; Refill: 1  Acute cystitis without hematuria -     Sulfamethoxazole -Trimethoprim ; Take 1 tablet by mouth 2 (two) times daily for 5 days.  Dispense: 10 tablet; Refill:  0     Follow-up: Return in about 6 months (around 02/24/2025).  Debby Molt, MD

## 2024-08-26 NOTE — Patient Instructions (Signed)
 GERD in Adults: What to Know  Gastroesophageal reflux (GER) is when acid from your stomach flows up into your esophagus. Your esophagus is the part of your body that moves food from your mouth to your stomach. Normally, food goes down and stays in your stomach to be digested. But with GER, food and stomach acid may go back up. You may have a disease called gastroesophageal reflux disease (GERD) if the reflux: Happens often. Causes very bad symptoms. Makes your esophagus sore and swollen. Over time, GERD can make small holes called ulcers in the lining of your esophagus. What are the causes? GERD is caused by a problem with the muscle between your esophagus and stomach. This muscle is called the lower esophageal sphincter (LES). When it's weak or not normal, it doesn't close like it should. This means food and stomach acid can go back up into your esophagus. The muscle can be weak if: You smoke or use products with tobacco in them. You're pregnant. You have a type of hernia called a hiatal hernia. You eat certain foods and drinks. These include: Alcohol. Coffee. Chocolate. Onions. Peppermint. What increases the risk? Being overweight. Having a disease that affects your connective tissue. Taking NSAIDs, such as ibuprofen. What are the signs or symptoms? Heartburn. Trouble swallowing. Pain when you swallow. The feeling of having a lump in your throat. A bitter taste in your mouth. Bad breath. Having an upset or bloated stomach. Burping. Chest pain. Other conditions can also cause chest pain. Make sure you see your health care provider if you have chest pain. Wheezing. This is when you make high-pitched whistling sounds when you breathe, most often when you breathe out. A long-term cough or a cough at night. How is this diagnosed? GERD may be diagnosed based on your medical history and a physical exam. You may also have tests. These may include: An endoscopy. This test looks at your  stomach and esophagus with a small camera. A barium swallow test. This shows the shape and size of your esophagus and how well it's working. Tests of your esophagus to check for: Acid levels. Pressure. How is this treated? Treatment may depend on how bad your symptoms are. It may include: Changes to your diet and daily life. Medicines. Surgery. Follow these instructions at home: Eating and drinking Follow an eating plan as told by your provider. You may need to avoid certain foods and drinks. These may include: Coffee and tea, with or without caffeine. Alcohol. Energy drinks and sports drinks. Fizzy drinks or sodas. Chocolate and cocoa. Peppermint and mint flavorings. Garlic and onions. Horseradish. Spicy and acidic foods. These include: Peppers. Chili powder and curry powder. Vinegar. Hot sauces and BBQ sauce. Citrus fruits and juices. These include: Oranges. Lemons. Limes. Tomato-based foods. These include: Red sauce and pizza with red sauce. Chili. Salsa. Fried and fatty foods. These include: Donuts. [place] fries. Potato chips. High-fat dressings. High-fat meats. These include: Hot dogs and sausage. Rib eye steak. Ham and bacon. High-fat dairy items. These include: Whole milk. Butter. Cream cheese. Eat small meals often. Avoid eating big meals. Avoid drinking lots of liquid with your meals. Try not to eat meals during the 2-3 hours before bedtime. Try not to lie down right after you eat. Do not exercise right after you eat. Lifestyle  If you're overweight, lose an amount of weight that's healthy for you. Ask your provider about a safe weight loss goal. Do not smoke, vape, or use nicotine or tobacco. Wear  loose clothes. Do not wear things that are tight around your waist. When you sleep, try: Raising the head of your bed about 6 inches (15 cm). You can use a wedge to do this. Lying down on your left side. Try to lower your stress. If you need help doing  this, ask your provider. General instructions Take your medicines only as told. Do not take aspirin or ibuprofen unless you're told to. Watch for any changes in your symptoms. Do not bend over if it makes your symptoms worse. Contact a health care provider if: You have new symptoms. You have trouble: Drinking. Swallowing. Eating. It hurts to swallow. You have wheezing. You have a cough that won't go away. Your voice is hoarse. Your symptoms don't get better with treatment. Get help right away if: You have pain all of a sudden in your: Arm. Neck. Jaw. Teeth. Back. You feel sweaty, dizzy, or light-headed all of a sudden. You faint. You have chest pain or shortness of breath. You vomit and the vomit is: Green, yellow, or black. Looks like blood or coffee grounds. Your poop is red, bloody, or black. These symptoms may be an emergency. Call 911 right away. Do not wait to see if the symptoms will go away. Do not drive yourself to the hospital. This information is not intended to replace advice given to you by your health care provider. Make sure you discuss any questions you have with your health care provider. Document Revised: 07/16/2023 Document Reviewed: 01/30/2023 Elsevier Patient Education  2024 [employer].

## 2024-08-27 ENCOUNTER — Other Ambulatory Visit: Payer: Self-pay | Admitting: Internal Medicine

## 2024-08-27 DIAGNOSIS — N3000 Acute cystitis without hematuria: Secondary | ICD-10-CM | POA: Insufficient documentation

## 2024-08-27 DIAGNOSIS — M1A39X0 Chronic gout due to renal impairment, multiple sites, without tophus (tophi): Secondary | ICD-10-CM

## 2024-08-27 LAB — PTH, INTACT AND CALCIUM
Calcium: 9.5 mg/dL (ref 8.6–10.4)
PTH: 80 pg/mL — ABNORMAL HIGH (ref 16–77)

## 2024-08-27 LAB — THYROID PANEL WITH TSH
Free Thyroxine Index: 1.6 (ref 1.4–3.8)
T3 Uptake: 34 % (ref 22–35)
T4, Total: 4.6 ug/dL — ABNORMAL LOW (ref 5.1–11.9)
TSH: 0.24 m[IU]/L — ABNORMAL LOW (ref 0.40–4.50)

## 2024-08-27 MED ORDER — VILAZODONE HCL 40 MG PO TABS: 40.0000 mg | ORAL_TABLET | Freq: Every day | ORAL | 1 refills | Status: AC

## 2024-09-01 ENCOUNTER — Other Ambulatory Visit: Payer: Self-pay | Admitting: Internal Medicine

## 2024-09-01 ENCOUNTER — Telehealth: Payer: Self-pay

## 2024-09-01 DIAGNOSIS — M48062 Spinal stenosis, lumbar region with neurogenic claudication: Secondary | ICD-10-CM | POA: Diagnosis not present

## 2024-09-01 DIAGNOSIS — M25512 Pain in left shoulder: Secondary | ICD-10-CM | POA: Diagnosis not present

## 2024-09-01 DIAGNOSIS — E781 Pure hyperglyceridemia: Secondary | ICD-10-CM

## 2024-09-01 NOTE — Telephone Encounter (Signed)
 Copied from CRM [id number]. Topic: Clinical - Prescription Issue >> Sep 01, 2024 11:45 AM Deleta RAMAN wrote: Reason for CRM: [facility] calling due to medications needing to be refilled and would like an update rather to proceed. [phone]

## 2024-09-02 NOTE — Telephone Encounter (Signed)
 I have made the pharmacy aware that her medications has been refilled.

## 2024-09-03 ENCOUNTER — Telehealth: Payer: Self-pay

## 2024-09-03 NOTE — Telephone Encounter (Signed)
 Copied from CRM #[id number]. Topic: Clinical - Prescription Issue >> Sep 02, 2024  1:23 PM Brittany M wrote: Reason for CRM: [facility] calling- Needing a nurse or Dr Joshua to reach out as soon as possible- bactrum interaction with  irbesartan  (AVAPRO ) 150 MG tablet

## 2024-09-03 NOTE — Telephone Encounter (Signed)
 Please advise. Bactrium was sent in for the patient on the 11th to take bid for 5 days. The pharmacy is concerned about the interaction this medication can have with the patient BP medicine. The patient hasn't picked this up yet. Please advise.

## 2024-09-04 ENCOUNTER — Other Ambulatory Visit: Payer: Self-pay | Admitting: Internal Medicine

## 2024-09-04 DIAGNOSIS — N3000 Acute cystitis without hematuria: Secondary | ICD-10-CM

## 2024-09-04 MED ORDER — CIPROFLOXACIN HCL 250 MG PO TABS: 250.0000 mg | ORAL_TABLET | Freq: Every day | ORAL | 0 refills | Status: AC

## 2024-09-04 NOTE — Telephone Encounter (Signed)
Changed to cipro

## 2024-09-04 NOTE — Telephone Encounter (Signed)
 Patient has been made aware of this change via My chart message.

## 2024-09-08 ENCOUNTER — Ambulatory Visit: Admitting: Dermatology

## 2024-09-08 ENCOUNTER — Encounter: Payer: Self-pay | Admitting: Dermatology

## 2024-09-08 VITALS — BP 153/92 | HR 79

## 2024-09-08 DIAGNOSIS — L905 Scar conditions and fibrosis of skin: Secondary | ICD-10-CM

## 2024-09-08 DIAGNOSIS — L578 Other skin changes due to chronic exposure to nonionizing radiation: Secondary | ICD-10-CM

## 2024-09-08 DIAGNOSIS — R238 Other skin changes: Secondary | ICD-10-CM

## 2024-09-08 DIAGNOSIS — C4491 Basal cell carcinoma of skin, unspecified: Secondary | ICD-10-CM

## 2024-09-08 DIAGNOSIS — Z85828 Personal history of other malignant neoplasm of skin: Secondary | ICD-10-CM

## 2024-09-08 MED ORDER — TRETINOIN 0.025 % EX CREA: TOPICAL_CREAM | CUTANEOUS | 1 refills | Status: AC

## 2024-09-08 NOTE — Progress Notes (Addendum)
" ° °  Follow Up Visit   Subjective  Leslie Edwards is a 78 y.o. female who presents for the following: follow up from Mohs surgery   The patient presents for follow up from Mohs surgery for a BCC on the left malar cheek, treated on 04/09/24, repaired with linear closure. The patient has been bandaging the wound as directed. The endorse the following concerns: area is  bothersome, bumpy, and sore causing nose to run. Has been using moderma and daily massage.   The following portions of the chart were reviewed this encounter and updated as appropriate: medications, allergies, medical history  Review of Systems:  No other skin or systemic complaints except as noted in HPI or Assessment and Plan.  Objective  Well appearing patient in no apparent distress; mood and affect are within normal limits.  A focal examination was performed including scalp, head, face . All findings within normal limits unless otherwise noted below.  Healing wound with mild erythema  Relevant physical exam findings are noted in the Assessment and Plan.    Assessment & Plan   Scar s/p Mohs for [facility] on the left malar cheek, treated on 04/09/24, repaired with linear closure/- Reassured that wound is healing well - Reassured that wound has healed well.  - Discussed that scars take up to 12 months to mature from the date of surgery - Recommend SPF 30+ to scar daily to prevent purple color from UV exposure during scar maturation process - Discussed that erythema and raised appearance of scar will fade over the next 4-6 months - OK to continue scar massage.  - Can continue silicone based products for scar healing starting at 6 weeks post-op - Discuss dermabrasion and resurfacing CO2 Laser to improve appearance of scar.   HISTORY OF BASAL CELL CARCINOMA OF THE SKIN - No evidence of recurrence today - Recommend regular full body skin exams - Recommend daily broad spectrum sunscreen SPF 30+ to sun-exposed areas, reapply every  2 hours as needed.  - Call if any new or changing lesions are noted between office visits  Photoaging of the Face-Chronic Condition with Acute Flare, not at treatment goal The patient was evaluated for photoaging of the face, a chronic condition with an acute flare. Current symptoms and skin changes indicate that treatment goals have not yet been met. The patient was counseled on the importance of consistent sun protection, including daily use of broad-spectrum sunscreen (SPF 30 or higher), wearing protective clothing, and avoiding excessive sun exposure to prevent further damage. The use of tretinoin  was discussed as a key treatment for improving skin texture, fine lines, and hyperpigmentation, with instructions on proper application, potential irritation, and the importance of long-term adherence for optimal results. The patient was encouraged to continue treatment and follow up for further evaluation and possible treatment adjustments. - Tretinoin  sent  Return in about 3 months (around 12/21/2024) for Scar follow up.  I, Darice Smock, CMA, am acting as scribe for RUFUS CHRISTELLA HOLY, MD.   Documentation: I have reviewed the above documentation for accuracy and completeness, and I agree with the above.  RUFUS CHRISTELLA HOLY, MD  "

## 2024-09-08 NOTE — Patient Instructions (Addendum)
 SKIN CARE RECOMMENDATIONS: A- Vitamin A Derivative. Retinoid (OTC) vs Retinol (Rx) B- Broad Spectrum Sunscreen SPF 30+ C- Vitamin C   Tretinoin : Use every 3rd night and work up to nightly. If you experience redness and irritation reduce the number of applications.         Post-Operative Scar Care: Education and Recommendations  Following your procedure, it's important to care for your scar to promote optimal healing and minimize its appearance. Proper post-operative care can help ensure that the scar heals well, and with time, it may become less noticeable. Below are key recommendations for scar care, including scar massage and the use of silicone scar gels or sheets.  1. General Scar Care Tips: -  Keep the wound clean and dry: Follow your healthcare providers instructions for wound care, including cleaning the site and changing dressings as needed. -  Avoid sun exposure: Direct sunlight can darken scars and make them more noticeable. Once your wound has healed, apply sunscreen (SPF 30 or higher) to protect the scar from UV rays.  2. Scar Massage: - Start after healing: Wait until the scar has fully healed, with no scabs or open areas (usually 4-6 weeks after surgery). Your healthcare provider will give you specific guidance on when to begin. - Technique: Gently massage the scar in a circular motion for 5-10 minutes, 2-3 times per day. This helps to soften the tissue, reduce swelling, and improve the overall appearance of the scar. - Pressure: Apply gentle, firm pressure during the massage to break down the dense tissue that may form during healing. This helps to prevent the formation of keloids or hypertrophic scars. - Use lotion or ointment: Consider using a mild, fragrance-free lotion or vitamin E ointment to help lubricate the area during massage.  3. Silicone Scar Gels or Sheets: - When to start: Once your wound has healed completely, typically around 4-6 weeks, you can begin using  silicone-based scar gels or sheets. These have been shown to improve scar appearance by hydrating the tissue and reducing inflammation. - How to use silicone gels: Apply a thin layer of the gel to the scar and allow it to dry before covering with clothing. You can use the gel multiple times a day, depending on your provider's recommendation. - How to use silicone sheets: Cut the sheet to fit the size of your scar, and apply it directly to the healed scar. Wear it for 12-24 hours a day, and replace the sheet every few days as directed. - Benefits: Silicone helps reduce redness, flatten the scar, and improve its texture. Continued use over several months can lead to significant improvement in the appearance of the scar.  4. What to Expect: - Healing process: Scars generally take time to mature. The first few months may show redness or swelling, but this usually improves as healing progresses. - Long-term care: Scarring is a natural part of the healing process. While you cannot completely eliminate a scar, proper care can significantly improve its appearance over time. - Patience: It can take up to a year for a scar to fully mature, so its important to be consistent with scar care and follow-up appointments with your provider.  5. When to Contact Your Healthcare Provider: - If you notice signs of infection (increased redness, warmth, drainage, or pain). - If your scar becomes unusually raised, itchy, or changes in color significantly. - If you have concerns about the appearance of your scar or experience unusual symptoms. - By following these guidelines, you  can support your bodys natural healing process and help ensure the best possible outcome for your scar. If you have any questions or concerns, please dont hesitate to contact our office.   Important Information  Due to recent changes in healthcare laws, you may see results of your pathology and/or laboratory studies on MyChart before the  doctors have had a chance to review them. We understand that in some cases there may be results that are confusing or concerning to you. Please understand that not all results are received at the same time and often the doctors may need to interpret multiple results in order to provide you with the best plan of care or course of treatment. Therefore, we ask that you please give us  2 business days to thoroughly review all your results before contacting the office for clarification. Should we see a critical lab result, you will be contacted sooner.   If You Need Anything After Your Visit  If you have any questions or concerns for your doctor, please call our main line at [phone] If no one answers, please leave a voicemail as directed and we will return your call as soon as possible. Messages left after 4 pm will be answered the following business day.   You may also send us  a message via MyChart. We typically respond to MyChart messages within 1-2 business days.  For prescription refills, please ask your pharmacy to contact our office. Our fax number is [phone].  If you have an urgent issue when the clinic is closed that cannot wait until the next business day, you can page your doctor at the number below.    Please note that while we do our best to be available for urgent issues outside of office hours, we are not available 24/7.   If you have an urgent issue and are unable to reach us , you may choose to seek medical care at your doctor's office, retail clinic, urgent care center, or emergency room.  If you have a medical emergency, please immediately call 911 or go to the emergency department. In the event of inclement weather, please call our main line at [phone] for an update on the status of any delays or closures.  Dermatology Medication Tips: Please keep the boxes that topical medications come in in order to help keep track of the instructions about where and how to use  these. Pharmacies typically print the medication instructions only on the boxes and not directly on the medication tubes.   If your medication is too expensive, please contact our office at [phone] or send us  a message through MyChart.   We are unable to tell what your co-pay for medications will be in advance as this is different depending on your insurance coverage. However, we may be able to find a substitute medication at lower cost or fill out paperwork to get insurance to cover a needed medication.   If a prior authorization is required to get your medication covered by your insurance company, please allow us  1-2 business days to complete this process.  Drug prices often vary depending on where the prescription is filled and some pharmacies may offer cheaper prices.  The website www.goodrx.com contains coupons for medications through different pharmacies. The prices here do not account for what the cost may be with help from insurance (it may be cheaper with your insurance), but the website can give you the price if you did not use any insurance.  - You can print the  associated coupon and take it with your prescription to the pharmacy.  - You may also stop by our office during regular business hours and pick up a GoodRx coupon card.  - If you need your prescription sent electronically to a different pharmacy, notify our office through [facility] or by phone at [phone]

## 2024-10-15 ENCOUNTER — Other Ambulatory Visit: Payer: Self-pay | Admitting: Internal Medicine

## 2024-10-15 ENCOUNTER — Encounter: Payer: Self-pay | Admitting: Internal Medicine

## 2024-10-15 DIAGNOSIS — M48062 Spinal stenosis, lumbar region with neurogenic claudication: Secondary | ICD-10-CM

## 2024-10-15 DIAGNOSIS — M51369 Other intervertebral disc degeneration, lumbar region without mention of lumbar back pain or lower extremity pain: Secondary | ICD-10-CM

## 2024-10-15 DIAGNOSIS — M170 Bilateral primary osteoarthritis of knee: Secondary | ICD-10-CM

## 2024-10-15 MED ORDER — OXYCODONE HCL 5 MG PO TABS: 5.0000 mg | ORAL_TABLET | Freq: Four times a day (QID) | ORAL | 0 refills | Status: AC | PRN

## 2024-10-23 ENCOUNTER — Encounter: Payer: Self-pay | Admitting: Internal Medicine

## 2024-12-21 ENCOUNTER — Ambulatory Visit: Admitting: Dermatology

## 2024-12-25 ENCOUNTER — Ambulatory Visit

## 2025-02-24 ENCOUNTER — Ambulatory Visit: Admitting: Internal Medicine
